# Patient Record
Sex: Male | Born: 1980
Health system: Southern US, Community
[De-identification: ages and names within clinical notes are randomized; demographics above are authoritative.]

## PROBLEM LIST (undated history)

## (undated) DIAGNOSIS — F319 Bipolar disorder, unspecified: Secondary | ICD-10-CM

## (undated) DIAGNOSIS — G20C Parkinsonism, unspecified: Secondary | ICD-10-CM

## (undated) DIAGNOSIS — F410 Panic disorder [episodic paroxysmal anxiety] without agoraphobia: Secondary | ICD-10-CM

## (undated) DIAGNOSIS — E119 Type 2 diabetes mellitus without complications: Secondary | ICD-10-CM

## (undated) DIAGNOSIS — E46 Unspecified protein-calorie malnutrition: Secondary | ICD-10-CM

## (undated) DIAGNOSIS — G20 Parkinson's disease: Secondary | ICD-10-CM

## (undated) HISTORY — PX: NECK SURGERY: SHX720

---

## 2007-01-25 ENCOUNTER — Emergency Department (HOSPITAL_COMMUNITY): Admission: EM | Admit: 2007-01-25 | Discharge: 2007-01-26 | Payer: Self-pay | Admitting: Emergency Medicine

## 2011-02-11 LAB — WOUND CULTURE: Gram Stain: NONE SEEN

## 2014-07-21 ENCOUNTER — Emergency Department (HOSPITAL_COMMUNITY): Payer: Medicaid Other

## 2014-07-21 ENCOUNTER — Encounter (HOSPITAL_COMMUNITY): Payer: Self-pay

## 2014-07-21 ENCOUNTER — Inpatient Hospital Stay (HOSPITAL_COMMUNITY)
Admission: EM | Admit: 2014-07-21 | Discharge: 2014-07-29 | DRG: 391 | Disposition: A | Discharge status: Home / Self Care | Payer: Medicaid Other | Attending: Internal Medicine | Admitting: Internal Medicine

## 2014-07-21 DIAGNOSIS — E43 Unspecified severe protein-calorie malnutrition: Secondary | ICD-10-CM | POA: Insufficient documentation

## 2014-07-21 DIAGNOSIS — E876 Hypokalemia: Secondary | ICD-10-CM | POA: Diagnosis present

## 2014-07-21 DIAGNOSIS — G4700 Insomnia, unspecified: Secondary | ICD-10-CM | POA: Diagnosis present

## 2014-07-21 DIAGNOSIS — K449 Diaphragmatic hernia without obstruction or gangrene: Secondary | ICD-10-CM | POA: Diagnosis present

## 2014-07-21 DIAGNOSIS — K045 Chronic apical periodontitis: Secondary | ICD-10-CM | POA: Diagnosis present

## 2014-07-21 DIAGNOSIS — K036 Deposits [accretions] on teeth: Secondary | ICD-10-CM | POA: Diagnosis present

## 2014-07-21 DIAGNOSIS — R45851 Suicidal ideations: Secondary | ICD-10-CM | POA: Diagnosis present

## 2014-07-21 DIAGNOSIS — K219 Gastro-esophageal reflux disease without esophagitis: Secondary | ICD-10-CM | POA: Diagnosis present

## 2014-07-21 DIAGNOSIS — R509 Fever, unspecified: Secondary | ICD-10-CM | POA: Diagnosis not present

## 2014-07-21 DIAGNOSIS — S0990XA Unspecified injury of head, initial encounter: Secondary | ICD-10-CM

## 2014-07-21 DIAGNOSIS — F8081 Childhood onset fluency disorder: Secondary | ICD-10-CM | POA: Diagnosis present

## 2014-07-21 DIAGNOSIS — G259 Extrapyramidal and movement disorder, unspecified: Secondary | ICD-10-CM

## 2014-07-21 DIAGNOSIS — R1310 Dysphagia, unspecified: Principal | ICD-10-CM

## 2014-07-21 DIAGNOSIS — F329 Major depressive disorder, single episode, unspecified: Secondary | ICD-10-CM | POA: Diagnosis present

## 2014-07-21 DIAGNOSIS — K083 Retained dental root: Secondary | ICD-10-CM | POA: Diagnosis present

## 2014-07-21 DIAGNOSIS — D509 Iron deficiency anemia, unspecified: Secondary | ICD-10-CM | POA: Diagnosis present

## 2014-07-21 DIAGNOSIS — Z590 Homelessness: Secondary | ICD-10-CM

## 2014-07-21 DIAGNOSIS — Z23 Encounter for immunization: Secondary | ICD-10-CM

## 2014-07-21 DIAGNOSIS — K029 Dental caries, unspecified: Secondary | ICD-10-CM | POA: Diagnosis present

## 2014-07-21 DIAGNOSIS — R29818 Other symptoms and signs involving the nervous system: Secondary | ICD-10-CM | POA: Diagnosis present

## 2014-07-21 DIAGNOSIS — K047 Periapical abscess without sinus: Secondary | ICD-10-CM | POA: Diagnosis present

## 2014-07-21 DIAGNOSIS — M264 Malocclusion, unspecified: Secondary | ICD-10-CM | POA: Diagnosis present

## 2014-07-21 DIAGNOSIS — R259 Unspecified abnormal involuntary movements: Secondary | ICD-10-CM

## 2014-07-21 DIAGNOSIS — R262 Difficulty in walking, not elsewhere classified: Secondary | ICD-10-CM | POA: Diagnosis present

## 2014-07-21 DIAGNOSIS — K0530 Chronic periodontitis, unspecified: Secondary | ICD-10-CM | POA: Diagnosis present

## 2014-07-21 HISTORY — DX: Panic disorder (episodic paroxysmal anxiety): F41.0

## 2014-07-21 LAB — CBC WITH DIFFERENTIAL/PLATELET
Basophils Absolute: 0.1 10*3/uL (ref 0.0–0.1)
Basophils Relative: 1 % (ref 0–1)
Eosinophils Absolute: 0.1 10*3/uL (ref 0.0–0.7)
Eosinophils Relative: 2 % (ref 0–5)
HCT: 37.3 % — ABNORMAL LOW (ref 39.0–52.0)
Hemoglobin: 11.7 g/dL — ABNORMAL LOW (ref 13.0–17.0)
Lymphocytes Relative: 6 % — ABNORMAL LOW (ref 12–46)
Lymphs Abs: 0.4 10*3/uL — ABNORMAL LOW (ref 0.7–4.0)
MCH: 23.4 pg — ABNORMAL LOW (ref 26.0–34.0)
MCHC: 31.4 g/dL (ref 30.0–36.0)
MCV: 74.7 fL — ABNORMAL LOW (ref 78.0–100.0)
Monocytes Absolute: 0.8 10*3/uL (ref 0.1–1.0)
Monocytes Relative: 14 % — ABNORMAL HIGH (ref 3–12)
Neutro Abs: 4.6 10*3/uL (ref 1.7–7.7)
Neutrophils Relative %: 77 % (ref 43–77)
Platelets: 360 10*3/uL (ref 150–400)
RBC: 4.99 MIL/uL (ref 4.22–5.81)
RDW: 17 % — ABNORMAL HIGH (ref 11.5–15.5)
WBC: 6 10*3/uL (ref 4.0–10.5)

## 2014-07-21 LAB — URINALYSIS, ROUTINE W REFLEX MICROSCOPIC
Bilirubin Urine: NEGATIVE
Glucose, UA: NEGATIVE mg/dL
Hgb urine dipstick: NEGATIVE
Ketones, ur: 15 mg/dL — AB
Leukocytes, UA: NEGATIVE
Nitrite: NEGATIVE
Protein, ur: NEGATIVE mg/dL
Specific Gravity, Urine: 1.017 (ref 1.005–1.030)
Urobilinogen, UA: 1 mg/dL (ref 0.0–1.0)
pH: 7 (ref 5.0–8.0)

## 2014-07-21 LAB — COMPREHENSIVE METABOLIC PANEL
ALT: 13 U/L (ref 0–53)
AST: 19 U/L (ref 0–37)
Albumin: 2.9 g/dL — ABNORMAL LOW (ref 3.5–5.2)
Alkaline Phosphatase: 62 U/L (ref 39–117)
Anion gap: 7 (ref 5–15)
BUN: <5 mg/dL — ABNORMAL LOW (ref 6–23)
CO2: 29 mmol/L (ref 19–32)
Calcium: 8.8 mg/dL (ref 8.4–10.5)
Chloride: 103 mmol/L (ref 96–112)
Creatinine, Ser: 0.81 mg/dL (ref 0.50–1.35)
GFR calc Af Amer: >90 mL/min (ref >90)
GFR calc non Af Amer: >90 mL/min (ref >90)
Glucose, Bld: 90 mg/dL (ref 70–99)
Potassium: 3.1 mmol/L — ABNORMAL LOW (ref 3.5–5.1)
Sodium: 139 mmol/L (ref 135–145)
Total Bilirubin: 0.7 mg/dL (ref 0.3–1.2)
Total Protein: 6 g/dL (ref 6.0–8.3)

## 2014-07-21 LAB — CK: Total CK: 341 U/L — ABNORMAL HIGH (ref 7–232)

## 2014-07-21 LAB — RAPID URINE DRUG SCREEN, HOSP PERFORMED
Amphetamines: NOT DETECTED
Barbiturates: NOT DETECTED
Benzodiazepines: NOT DETECTED
Cocaine: NOT DETECTED
Opiates: NOT DETECTED
Tetrahydrocannabinol: NOT DETECTED

## 2014-07-21 LAB — MAGNESIUM: Magnesium: 1.8 mg/dL (ref 1.5–2.5)

## 2014-07-21 LAB — SEDIMENTATION RATE: Sed Rate: 9 mm/hr (ref 0–16)

## 2014-07-21 LAB — TSH: TSH: 0.945 u[IU]/mL (ref 0.350–4.500)

## 2014-07-21 MED ORDER — ACETAMINOPHEN 325 MG PO TABS
650.0000 mg | ORAL_TABLET | Freq: Four times a day (QID) | ORAL | Status: DC | PRN
  Administered 2014-07-25 (×2): 650 mg via ORAL
  Filled 2014-07-21 (×2): qty 2

## 2014-07-21 MED ORDER — IOHEXOL 300 MG/ML  SOLN
80.0000 mL | Freq: Once | INTRAMUSCULAR | Status: AC | PRN
  Administered 2014-07-21: 80 mL via INTRAVENOUS

## 2014-07-21 MED ORDER — ONDANSETRON HCL 4 MG/2ML IJ SOLN: 4.0000 mg | Freq: Four times a day (QID) | INTRAMUSCULAR | Status: DC | PRN

## 2014-07-21 MED ORDER — ACETAMINOPHEN 650 MG RE SUPP: 650.0000 mg | Freq: Four times a day (QID) | RECTAL | Status: DC | PRN

## 2014-07-21 MED ORDER — ONDANSETRON HCL 4 MG PO TABS: 4.0000 mg | ORAL_TABLET | Freq: Four times a day (QID) | ORAL | Status: DC | PRN

## 2014-07-21 MED ORDER — SODIUM CHLORIDE 0.9 % IV SOLN
Freq: Once | INTRAVENOUS | Status: AC
  Administered 2014-07-21: 17:00:00 via INTRAVENOUS

## 2014-07-21 MED ORDER — SODIUM CHLORIDE 0.9 % IV SOLN
3.0000 g | Freq: Four times a day (QID) | INTRAVENOUS | Status: DC
  Administered 2014-07-21 – 2014-07-25 (×14): 3 g via INTRAVENOUS
  Filled 2014-07-21 (×16): qty 3

## 2014-07-21 MED ORDER — KCL IN DEXTROSE-NACL 20-5-0.9 MEQ/L-%-% IV SOLN
INTRAVENOUS | Status: AC
  Administered 2014-07-21 – 2014-07-22 (×2): via INTRAVENOUS
  Filled 2014-07-21 (×2): qty 1000

## 2014-07-21 MED ORDER — DIPHENHYDRAMINE HCL 50 MG/ML IJ SOLN
25.0000 mg | Freq: Once | INTRAMUSCULAR | Status: AC
Start: 2014-07-21 — End: 2014-07-21
  Administered 2014-07-21: 25 mg via INTRAVENOUS
  Filled 2014-07-21: qty 1

## 2014-07-21 NOTE — ED Notes (Signed)
X-Ray at bedside.
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
## 2014-07-21 NOTE — ED Notes (Signed)
Main lab, [facility], called to add heavy metals urine screen.

## 2014-07-21 NOTE — ED Notes (Signed)
Patient returned from MRI; notified CT patient is ready for CT.

## 2014-07-21 NOTE — ED Notes (Signed)
Report attempted 
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
## 2014-07-21 NOTE — ED Notes (Signed)
Patient transported to CT 

## 2014-07-21 NOTE — ED Notes (Signed)
With any updated information please call pt cousin Remo Lipps [phone].
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
## 2014-07-21 NOTE — ED Notes (Signed)
Patient transported to MRI 

## 2014-07-21 NOTE — ED Notes (Signed)
Still in CT at time of care handoff.

## 2014-07-22 ENCOUNTER — Observation Stay (HOSPITAL_COMMUNITY): Payer: Medicaid Other

## 2014-07-22 DIAGNOSIS — R262 Difficulty in walking, not elsewhere classified: Secondary | ICD-10-CM | POA: Diagnosis present

## 2014-07-22 DIAGNOSIS — R259 Unspecified abnormal involuntary movements: Secondary | ICD-10-CM

## 2014-07-22 DIAGNOSIS — K449 Diaphragmatic hernia without obstruction or gangrene: Secondary | ICD-10-CM | POA: Diagnosis present

## 2014-07-22 DIAGNOSIS — R45851 Suicidal ideations: Secondary | ICD-10-CM

## 2014-07-22 DIAGNOSIS — F329 Major depressive disorder, single episode, unspecified: Secondary | ICD-10-CM | POA: Diagnosis present

## 2014-07-22 DIAGNOSIS — K0530 Chronic periodontitis, unspecified: Secondary | ICD-10-CM | POA: Diagnosis present

## 2014-07-22 DIAGNOSIS — K219 Gastro-esophageal reflux disease without esophagitis: Secondary | ICD-10-CM | POA: Diagnosis present

## 2014-07-22 DIAGNOSIS — D509 Iron deficiency anemia, unspecified: Secondary | ICD-10-CM | POA: Diagnosis present

## 2014-07-22 DIAGNOSIS — E43 Unspecified severe protein-calorie malnutrition: Secondary | ICD-10-CM | POA: Diagnosis present

## 2014-07-22 DIAGNOSIS — K029 Dental caries, unspecified: Secondary | ICD-10-CM | POA: Diagnosis present

## 2014-07-22 DIAGNOSIS — R1310 Dysphagia, unspecified: Secondary | ICD-10-CM | POA: Diagnosis present

## 2014-07-22 DIAGNOSIS — Z590 Homelessness: Secondary | ICD-10-CM | POA: Diagnosis not present

## 2014-07-22 DIAGNOSIS — F8081 Childhood onset fluency disorder: Secondary | ICD-10-CM | POA: Diagnosis present

## 2014-07-22 DIAGNOSIS — K045 Chronic apical periodontitis: Secondary | ICD-10-CM | POA: Diagnosis present

## 2014-07-22 DIAGNOSIS — K083 Retained dental root: Secondary | ICD-10-CM | POA: Diagnosis present

## 2014-07-22 DIAGNOSIS — F419 Anxiety disorder, unspecified: Secondary | ICD-10-CM

## 2014-07-22 DIAGNOSIS — E876 Hypokalemia: Secondary | ICD-10-CM | POA: Diagnosis present

## 2014-07-22 DIAGNOSIS — Z23 Encounter for immunization: Secondary | ICD-10-CM | POA: Diagnosis not present

## 2014-07-22 DIAGNOSIS — M264 Malocclusion, unspecified: Secondary | ICD-10-CM | POA: Diagnosis present

## 2014-07-22 DIAGNOSIS — G4700 Insomnia, unspecified: Secondary | ICD-10-CM | POA: Diagnosis present

## 2014-07-22 DIAGNOSIS — K047 Periapical abscess without sinus: Secondary | ICD-10-CM | POA: Diagnosis present

## 2014-07-22 LAB — COMPREHENSIVE METABOLIC PANEL
ALT: 11 U/L (ref 0–53)
AST: 18 U/L (ref 0–37)
Albumin: 2.7 g/dL — ABNORMAL LOW (ref 3.5–5.2)
Alkaline Phosphatase: 59 U/L (ref 39–117)
Anion gap: 9 (ref 5–15)
BUN: <5 mg/dL — ABNORMAL LOW (ref 6–23)
CO2: 27 mmol/L (ref 19–32)
Calcium: 8.5 mg/dL (ref 8.4–10.5)
Chloride: 104 mmol/L (ref 96–112)
Creatinine, Ser: 0.94 mg/dL (ref 0.50–1.35)
GFR calc Af Amer: >90 mL/min (ref >90)
GFR calc non Af Amer: >90 mL/min (ref >90)
Glucose, Bld: 98 mg/dL (ref 70–99)
Potassium: 3.2 mmol/L — ABNORMAL LOW (ref 3.5–5.1)
Sodium: 140 mmol/L (ref 135–145)
Total Bilirubin: 1.1 mg/dL (ref 0.3–1.2)
Total Protein: 5.7 g/dL — ABNORMAL LOW (ref 6.0–8.3)

## 2014-07-22 LAB — TROPONIN I
Troponin I: <0.03 ng/mL (ref <0.031)
Troponin I: <0.03 ng/mL (ref <0.031)
Troponin I: <0.03 ng/mL (ref <0.031)

## 2014-07-22 LAB — CBC WITH DIFFERENTIAL/PLATELET
Basophils Absolute: 0 10*3/uL (ref 0.0–0.1)
Basophils Relative: 1 % (ref 0–1)
Eosinophils Absolute: 0.1 10*3/uL (ref 0.0–0.7)
Eosinophils Relative: 3 % (ref 0–5)
HCT: 35.4 % — ABNORMAL LOW (ref 39.0–52.0)
Hemoglobin: 11.1 g/dL — ABNORMAL LOW (ref 13.0–17.0)
Lymphocytes Relative: 11 % — ABNORMAL LOW (ref 12–46)
Lymphs Abs: 0.6 10*3/uL — ABNORMAL LOW (ref 0.7–4.0)
MCH: 23.6 pg — ABNORMAL LOW (ref 26.0–34.0)
MCHC: 31.4 g/dL (ref 30.0–36.0)
MCV: 75.2 fL — ABNORMAL LOW (ref 78.0–100.0)
Monocytes Absolute: 0.9 10*3/uL (ref 0.1–1.0)
Monocytes Relative: 18 % — ABNORMAL HIGH (ref 3–12)
Neutro Abs: 3.3 10*3/uL (ref 1.7–7.7)
Neutrophils Relative %: 67 % (ref 43–77)
Platelets: 361 10*3/uL (ref 150–400)
RBC: 4.71 MIL/uL (ref 4.22–5.81)
RDW: 17.3 % — ABNORMAL HIGH (ref 11.5–15.5)
WBC: 5 10*3/uL (ref 4.0–10.5)

## 2014-07-22 LAB — VITAMIN B12: Vitamin B-12: 492 pg/mL (ref 211–911)

## 2014-07-22 LAB — RPR: RPR Ser Ql: NONREACTIVE

## 2014-07-22 LAB — HIV ANTIBODY (ROUTINE TESTING W REFLEX): HIV Screen 4th Generation wRfx: NONREACTIVE

## 2014-07-22 LAB — CK: Total CK: 218 U/L (ref 7–232)

## 2014-07-22 LAB — RETICULOCYTES
RBC.: 4.71 MIL/uL (ref 4.22–5.81)
Retic Count, Absolute: 51.8 10*3/uL (ref 19.0–186.0)
Retic Ct Pct: 1.1 % (ref 0.4–3.1)

## 2014-07-22 LAB — FOLATE: Folate: 17.9 ng/mL

## 2014-07-22 LAB — MRSA PCR SCREENING: MRSA by PCR: NEGATIVE

## 2014-07-22 LAB — TSH: TSH: 0.89 u[IU]/mL (ref 0.350–4.500)

## 2014-07-22 MED ORDER — MIRTAZAPINE 15 MG PO TBDP
15.0000 mg | ORAL_TABLET | Freq: Every day | ORAL | Status: DC
  Administered 2014-07-22 – 2014-07-23 (×2): 15 mg via ORAL
  Filled 2014-07-22 (×4): qty 1

## 2014-07-22 MED ORDER — CETYLPYRIDINIUM CHLORIDE 0.05 % MT LIQD
7.0000 mL | Freq: Two times a day (BID) | OROMUCOSAL | Status: DC
  Administered 2014-07-22 – 2014-07-28 (×14): 7 mL via OROMUCOSAL

## 2014-07-22 MED ORDER — INFLUENZA VAC SPLIT QUAD 0.5 ML IM SUSY
0.5000 mL | PREFILLED_SYRINGE | INTRAMUSCULAR | Status: AC
  Administered 2014-07-29: 0.5 mL via INTRAMUSCULAR
  Filled 2014-07-22 (×2): qty 0.5
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
## 2014-07-22 NOTE — Progress Notes (Signed)
INITIAL NUTRITION ASSESSMENT  Pt meets criteria for SEVERE MALNUTRITION in the context of acute illness/injury as evidenced by energy intake intake </= 50% for >/= 5 days, severe fat mass loss, and moderate to severe muscle mass loss.  DOCUMENTATION CODES Per approved criteria  -Severe malnutrition in the context of acute illness or injury   INTERVENTION: If pt continues to be NPO due to difficulty swallowing, recommend initiating enteral nutrition of Jevity 1.2 at 20 ml/hr and increase by 10 ml every 10 hours to goal rate of 70 ml/hr to provide 2016 kcal (100% of needs), 93 grams of protein, and 1361 ml of free water.  Once pt is started on nutrition, monitor magnesium, potassium, and phosphorus daily for at least 3 days, MD to replete as needed, as pt is at risk for refeeding syndrome given severe malnutrition and little to no PO intake in 6 weeks.  RD to continue to monitor.   NUTRITION DIAGNOSIS: Inadequate oral intake related to inability to eat as evidenced by NPO status  Goal: Pt to meet >/= 90% of their estimated nutrition needs   Monitor:  Diet advancement vs. EN, weight trends, labs, I/O's  Reason for Assessment: MD consult for assessment of nutrition requirements/status  34 y.o. male  Admitting Dx: Dysphagia  ASSESSMENT: Pt with no significant past medical history has been experiencing difficulty swallowing over the last 6 weeks. Pt unable to eat because he says he can not swallow. His speech is slurred and he stutters and can not get a lot of his words out intelligibly.  Family present at bedside. Pt able to understand questions asked, however unable to speak clearly. Family reports pt has been having difficulties swallowing over the past 6 weeks. She reports pt's po intake has been little to none. Pt is at risk for refeeding syndrome. Prior to the swallowing difficulty, family reports pt has been eating fine with no other difficulties. Family reports pt has had weight  loss with usual body weight of 140 lbs. Noted, pt with a 5% weight loss. Per SLP swallowing evaluation, diet recommendations are for NPO. If continues to be NPO due to difficulty swallowing, recommend enteral nutrition. TF recommendation orders have been given.   Nutrition Focused Physical Exam:  Subcutaneous Fat:  Orbital Region: N/A Upper Arm Region: Mild depletion Thoracic and Lumbar Region: Severe depletion  Muscle:  Temple Region: Moderate to Severe depletion Clavicle Bone Region: Moderate depletion Clavicle and Acromion Bone Region: Moderate depletion Scapular Bone Region: N/A Dorsal Hand: N/A Patellar Region: WNL Anterior Thigh Region: Moderate depletion Posterior Calf Region: WNL  Edema: none  Labs: Low potassium and BUN.  Height: Ht Readings from Last 1 Encounters:  07/21/14 5' 8"  (1.727 m)    Weight: Wt Readings from Last 1 Encounters:  07/21/14 133 lb 11.2 oz (60.646 kg)    Ideal Body Weight: 154 lbs  % Ideal Body Weight: 86%  Wt Readings from Last 10 Encounters:  07/21/14 133 lb 11.2 oz (60.646 kg)    Usual Body Weight: 140 lbs (per family report)  % Usual Body Weight: 95%  BMI:  Body mass index is 20.33 kg/(m^2).  Estimated Nutritional Needs: Kcal: 2000-2200 Protein: 80-100 grams Fluid: 2- 2.2 L/day  Skin: Intact  Diet Order: Diet NPO time specified  EDUCATION NEEDS: -No education needs identified at this time   Intake/Output Summary (Last 24 hours) at 07/22/14 1216 Last data filed at 07/22/14 0846  Gross per 24 hour  Intake 553.75 ml  Output  0 ml  Net 553.75 ml    Last BM: PTA  Labs:   Recent Labs Lab 07/21/14 1619 07/22/14 0555  NA 139 140  K 3.1* 3.2*  CL 103 104  CO2 29 27  BUN <5* <5*  CREATININE 0.81 0.94  CALCIUM 8.8 8.5  MG 1.8  --   GLUCOSE 90 98    CBG (last 3)  No results for input(s): GLUCAP in the last 72 hours.  Scheduled Meds: . ampicillin-sulbactam (UNASYN) IV  3 g Intravenous Q6H  .  antiseptic oral rinse  7 mL Mouth Rinse BID  . [START ON 07/23/2014] Influenza vac split quadrivalent PF  0.5 mL Intramuscular Tomorrow-1000    Continuous Infusions: . dextrose 5 % and 0.9 % NaCl with KCl 20 mEq/L 75 mL/hr at 07/22/14 0700    Past Medical History  Diagnosis Date  . Panic attack     Past Surgical History  Procedure Laterality Date  . Neck surgery      Kallie Locks, MS, RD, LDN Pager # [phone] After hours/ weekend pager # [phone]
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
## 2014-07-22 NOTE — Evaluation (Signed)
Physical Therapy Evaluation Patient Details Name: Carl Carey MRN: [id number] DOB: Apr 14, 1981 Today's Date: 07/22/2014   History of Present Illness  Pt is a 34 y.o. male with no significant PMH has been experiencing difficulty swallowing over the last 6 weeks. Patient was recently brought to home by patient's family from [place]. Patient states he has been unable to drink ice tea for last few weeks. Patient finds it difficult to swallow. In addition patient has been having difficulty walking with gait difficulties and tremors. Patient also takes time to bring out words. In the ER patient had CT maxillofacial and soft tissue neck and MRI brain. Nothing acute was seen but did show some periapical abscess. Labs show anemia. On-call neurologist has been consulted and patient has been admitted for further management.   Clinical Impression  Pt admitted with above diagnosis. Pt currently with functional limitations due to the deficits listed below (see PT Problem List). At the time of PT eval pt was able to perform transfers and ambulation with min assist. Pt appears to have declined signficantly in the past 6 weeks, and states his mother has to assist with all his ADL's. Pt also endorses that he has had difficulty feeding himself/holding a cup. OT consult requested. Feel that pt ultimately would benefit from a CIR stay to improve functional mobility and independence with ADL's. Due to observation status and lack of insurance coverage, recommending SNF after discussion with CIR Coordinator and CSW. If pt changes to an Inpatient status, will recommend a CIR screen.      Follow Up Recommendations SNF;Supervision/Assistance - 24 hour    Equipment Recommendations  Other (comment) (Shower seat; adaptive equipment per OT recommendations)    Recommendations for Other Services       Precautions / Restrictions Precautions Precautions: Fall Restrictions Weight Bearing Restrictions: No       Mobility  Bed Mobility Overal bed mobility: Needs Assistance Bed Mobility: Supine to Sit     Supine to sit: Min assist;HOB elevated     General bed mobility comments: Assist to achieve EOB. Pt required step-by-step cueing for sequencing.   Transfers Overall transfer level: Needs assistance Equipment used: None Transfers: Sit to/from Stand Sit to Stand: Min assist         General transfer comment: Assist for support and balance as pt powered-up to full standing position. Increased time to fully extend hips.   Ambulation/Gait Ambulation/Gait assistance: Min assist;+2 safety/equipment Ambulation Distance (Feet): 225 Feet Assistive device: 1 person hand held assist Gait Pattern/deviations: Decreased stride length;Shuffle;Trunk flexed;Narrow base of support Gait velocity: Decreased Gait velocity interpretation: Below normal speed for age/gender General Gait Details: Pt with no noted trunk rotation or arm swing. Pt turned whole body at once instead of only turning his head to look side to side. +2 assist helpful for equipment management as pt unable to push the IV pole while walking or hold a washcloth to his mouth to control excessive drooling.  Stairs            Wheelchair Mobility    Modified Rankin (Stroke Patients Only)       Balance Overall balance assessment: Needs assistance Sitting-balance support: Feet supported;No upper extremity supported Sitting balance-Leahy Scale: Fair Sitting balance - Comments: Increased time to achieve full sitting EOB but was able to hold without assist.    Standing balance support: No upper extremity supported Standing balance-Leahy Scale: Fair  Pertinent Vitals/Pain Pain Assessment: 0-10 Pain Score: 8  Faces Pain Scale: No hurt Pain Location: shoulders    Home Living Family/patient expects to be discharged to:: Private residence Living Arrangements: Parent Available Help at  Discharge: Family;Available PRN/intermittently             Additional Comments: Pt is not able to provide many details of home environment due to difficulty understanding pt    Prior Function Level of Independence: Needs assistance      ADL's / Homemaking Assistance Needed: Pt states his mother was assisting him with all bathing and dressing in the past 6 weeks.         Hand Dominance        Extremity/Trunk Assessment   Upper Extremity Assessment: Defer to OT evaluation (Noted pill rolling and possible increased tone with ROM)           Lower Extremity Assessment: Overall WFL for tasks assessed (Strength grossly 4+/5 but moves slowly actively)      Cervical / Trunk Assessment: Other exceptions  Communication   Communication: Expressive difficulties (Stuttering )  Cognition Arousal/Alertness: Awake/alert Behavior During Therapy: Flat affect Overall Cognitive Status: Difficult to assess                      General Comments      Exercises        Assessment/Plan    PT Assessment Patient needs continued PT services  PT Diagnosis Difficulty walking;Abnormality of gait   PT Problem List Decreased strength;Decreased range of motion;Decreased activity tolerance;Decreased mobility;Decreased balance;Decreased knowledge of use of DME;Decreased safety awareness;Decreased knowledge of precautions  PT Treatment Interventions DME instruction;Gait training;Functional mobility training;Stair training;Therapeutic activities;Therapeutic exercise;Neuromuscular re-education;Patient/family education   PT Goals (Current goals can be found in the Care Plan section) Acute Rehab PT Goals Patient Stated Goal: Pt did not state goals.  PT Goal Formulation: With patient Time For Goal Achievement: 08/05/14 Potential to Achieve Goals: Good    Frequency Min 3X/week   Barriers to discharge Decreased caregiver support Pt is alone during the day.     Co-evaluation                End of Session Equipment Utilized During Treatment: Gait belt Activity Tolerance: Patient tolerated treatment well Patient left: in chair;with call bell/phone within reach;Other (comment) (Alarm pad placed in chair but no box available. NT to get.) Nurse Communication: Mobility status    Functional Assessment Tool Used: Clinical judgement Functional Limitation: Mobility: Walking and moving around Mobility: Walking and Moving Around Current Status (I6803): At least 40 percent but less than 60 percent impaired, limited or restricted Mobility: Walking and Moving Around Goal Status [phone]): At least 20 percent but less than 40 percent impaired, limited or restricted    Time: 8250-0370 PT Time Calculation (min) (ACUTE ONLY): 24 min   Charges:   PT Evaluation $Initial PT Evaluation Tier I: 1 Procedure PT Treatments $Gait Training: 8-22 mins   PT G Codes:   PT G-Codes **NOT FOR INPATIENT CLASS** Functional Assessment Tool Used: Clinical judgement Functional Limitation: Mobility: Walking and moving around Mobility: Walking and Moving Around Current Status (W8889): At least 40 percent but less than 60 percent impaired, limited or restricted Mobility: Walking and Moving Around Goal Status [phone]): At least 20 percent but less than 40 percent impaired, limited or restricted    Rolinda Roan 07/22/2014, 10:43 AM   Rolinda Roan, PT, DPT Acute Rehabilitation Services Pager: [phone]

## 2014-07-22 NOTE — Progress Notes (Signed)
Occupational Therapy Evaluation Patient Details Name: Carl Carey MRN: [id number] DOB: 05-06-1980 Today's Date: 07/22/2014    History of Present Illness Pt is a 34 y.o. male with no significant PMH has been experiencing difficulty swallowing over the last 6 weeks. Patient was recently brought to home by patient's family from [place]. Per chart, Patient finds it difficult to swallow. In addition patient has been having difficulty walking with gait difficulties and tremors. In the ER patient had CT maxillofacial and soft tissue neck and MRI brain. Nothing acute was seen but did show some periapical abscess. Labs show anemia. Work up underway.   Clinical Impression   PTA, pt was homeless. Pt recently took bus to [place], where family picked him up. He has been living in [place] with his family and they have been assisting him with ADL and mobility. Pt able to answer questions after a delay, however speech is dysfluent and pt appears to stutter. Pt currently requires assistance with ADL and mobility and will benefit from acute OT to maximize functional level of independence and facilitate safe D/C home with 24/7 A of family and [place].     Follow Up Recommendations  Home health OT;Supervision/Assistance - 24 hour    Equipment Recommendations  3 in 1 bedside comode    Recommendations for Other Services       Precautions / Restrictions Precautions Precautions: Fall      Mobility Bed Mobility Overal bed mobility: Needs Assistance Bed Mobility: Supine to Sit;Sit to Supine     Supine to sit: Supervision Sit to supine: Supervision      Transfers Overall transfer level: Needs assistance   Transfers: Sit to/from Stand;Stand Pivot Transfers Sit to Stand: Supervision Stand pivot transfers: Min guard       General transfer comment: able to stand and take a few steps forward. min LOB posteriorly when stepping backward    Balance Overall balance assessment: Needs assistance    Sitting balance-Leahy Scale: Fair     Standing balance support: No upper extremity supported;During functional activity Standing balance-Leahy Scale: Fair                              ADL Overall ADL's : Needs assistance/impaired Eating/Feeding: NPO Eating/Feeding Details (indicate cue type and reason): Pt unable to swallow at this time Grooming: Minimal assistance Grooming Details (indicate cue type and reason): did not brush teeth due to swallow. Pt able to bring suction to mouth with vc. tactile cues to suction around tongue Upper Body Bathing: Minimal assitance   Lower Body Bathing: Moderate assistance (with continued verbal cues)   Upper Body Dressing : Moderate assistance   Lower Body Dressing: Minimal assistance   Toilet Transfer: Minimal assistance;Ambulation   Toileting- Clothing Manipulation and Hygiene: Min guard       Functional mobility during ADLs: Min guard General ADL Comments: Able to follow commands. Movements are very slow. Difficulty terminating movements     Vision  will further assess   Perception     Praxis Praxis Praxis tested?: Deficits Deficits: Perseveration Praxis-Other Comments: Pt requires cues to end tasks. Left suction at mouth and required tactile cues to remove suction from mouth.    Pertinent Vitals/Pain Pain Assessment: No/denies pain     Hand Dominance Right   Extremity/Trunk Assessment Upper Extremity Assessment Upper Extremity Assessment: RUE deficits/detail;LUE deficits/detail RUE Deficits / Details: AROM WFL although movements are not smooth; bradykinesia;"tremors" at times RUE Sensation:  (  pt reports WNL) RUE Coordination: decreased fine motor;decreased gross motor LUE Deficits / Details: same as R   Lower Extremity Assessment Lower Extremity Assessment:  (braykinesia)   Cervical / Trunk Assessment Cervical / Trunk Assessment: Other exceptions (appears "stiff")   Communication  Communication Communication: Expressive difficulties (apparent stuttering/poor vocal quality)   Cognition Arousal/Alertness: Awake/alert Behavior During Therapy: Flat affect Overall Cognitive Status: Difficult to assess (follows commands)                     General Comments       Exercises       Shoulder Instructions      Home Living Family/patient expects to be discharged to:: Private residence Living Arrangements: Parent Available Help at Discharge: Family;Available 24 hours/day Type of Home: House Home Access: Stairs to enter [employer] of Steps: 2   Home Layout: One level     Bathroom Shower/Tub: Tub/shower unit Shower/tub characteristics: Architectural technologist: Standard Bathroom Accessibility: Yes How Accessible: Accessible via walker Home Equipment: None          Prior Functioning/Environment Level of Independence: Needs assistance    ADL's / Homemaking Assistance Needed: Mother ahs been assisting him since she picked him up from [place] / Swallowing Assistance Needed: difficulty swallowing      OT Diagnosis: Generalized weakness;Other (comment) (movement disorder)   OT Problem List: Decreased strength;Decreased activity tolerance;Impaired balance (sitting and/or standing);Decreased coordination;Decreased knowledge of use of DME or AE;Impaired tone;Impaired UE functional use   OT Treatment/Interventions: Self-care/ADL training;Therapeutic exercise;Neuromuscular education;DME and/or AE instruction;Therapeutic activities;Patient/family education;Balance training    OT Goals(Current goals can be found in the care plan section) Acute Rehab OT Goals Patient Stated Goal: Pt did not state goals.  OT Goal Formulation: With patient/family Time For Goal Achievement: 08/05/14 Potential to Achieve Goals: Good  OT Frequency: Min 2X/week   Barriers to D/C:            Co-evaluation              End of Session Nurse  Communication: Mobility status  Activity Tolerance: Patient tolerated treatment well Patient left: in bed;with call bell/phone within reach;with bed alarm set;with family/visitor present   Time: 2585-2778 OT Time Calculation (min): 20 min Charges:  OT General Charges $OT Visit: 1 Procedure OT Evaluation $Initial OT Evaluation Tier I: 1 Procedure G-Codes: OT G-codes **NOT FOR INPATIENT CLASS** Functional Assessment Tool Used: clinical judgement Functional Limitation: Self care Self Care Current Status (E4235): At least 40 percent but less than 60 percent impaired, limited or restricted Self Care Goal Status (T6144): At least 1 percent but less than 20 percent impaired, limited or restricted  [facility] 07/22/2014, 2:39 PM   [facility], OTR/L  [phone] 07/22/2014

## 2014-07-22 NOTE — Progress Notes (Signed)
OT Cancellation Note  Patient Details Name: Carl Carey MRN: [id number] DOB: July 05, 1980   Cancelled Treatment:    Reason Eval/Treat Not Completed: Patient at procedure or test/ unavailable Pt with MD. Will return later if able. [place], OTR/L  [phone] 07/22/2014 07/22/2014, 12:26 PM

## 2014-07-22 NOTE — Consult Note (Signed)
[facility] Consult   Reason for Consult:  Depression, anxiety, suicidal thoughts and behaviors Referring Physician:  Dr. Eliseo Squires Patient Identification: Carl Carey MRN:  [id number] Principal Diagnosis: Dysphagia Diagnosis:   Patient Active Problem List   Diagnosis Date Noted  . Parkinsonian features [R25.9] 07/21/2014  . Dysphagia [R13.10] 07/21/2014  . Periapical abscess [K04.7] 07/21/2014  . Microcytic anemia [D50.9] 07/21/2014    Total Time spent with patient: 1 hour  Subjective:   Carl Carey is a 33 y.o. male patient admitted with generalized weakness, dysphagia and depression, anxiety, suicidal thoughts and behaviors.  HPI:  Carl Carey is an 34 y.o. male seen face-to-face for psychiatric evaluation for depression, anxiety, passive suicidal ideation and old behaviors. Reportedly patient has no previous acute psychiatric hospitalization or treatment. Patient was seen with his mother and a cousin in his room. Patient reported feeling depressed, sad, anxious, worried, weak, not able to walk and not able to swallow solid food. Reportedly he has been drinking fluids over 6-7 weeks. Patient mother and cousin reported that patient has been drifting away from home for the last 7 years and going to different cities like [place], [place] and [place] and living as a homeless. Reportedly he was sometimes stayed in homeless shelter, taking shower once a week and eat food wherever he can get. Patient also reported panhandling money to move around different cities. 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 Patient reported feeling anxiety about not able to swallow food. Patient stated having the suicidal thoughts sometimes but no intention or plans. Patient has no evidence of auditory/visual hallucinations, delusions or paranoia during this evaluation. Patient denied  history of drinking alcohol and abusing illicit drugs.  Medical history: Patient with a very confusing history. 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 The last the family has seen the patient was about 6-7 years ago. They got a call at the end of the week from [id number] asking them to pick him up. They brought him home where he has been unable to eat because he says he can not swallow. His mother reports that he has been drinking tea. He walks slowly. His speech is slurred and he stutters and can not get a lot of his words out intelligibly. He has been drooling. He reports that he has been this way for about 6 weeks but was able to get on a Grey[employer]ound bus from [place] to [place] about 2 weeks ago. He was prescribed Reglan and Protonix at [place]. His mother has been administering twice the prescribed dose. He reports he has been on no medications prior to this. He does not report any exposures. He has been living on the street.  When they last saw the patient 6-7 years ago he was normal.  HPI Elements:   Location:  Depression and anxiety. Quality:  Unable to care for himself. Severity:  Unable to swallow which made him more depressed and anxious. Timing:  6 weeks. 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 Context:  Severe psychosocial stresses.  Past Medical History:  Past Medical History  Diagnosis Date  . Panic attack     Past Surgical History  Procedure Laterality Date  . Neck surgery     Family History:  Family History  Problem Relation Age of  Onset  . Hypertension Maternal Grandmother    Social History:  History  Alcohol Use No     History  Drug Use No    History   Social History  . Marital Status: Single    Spouse Name: N/A  . Number of Children: N/A  . Years of Education: N/A   Social History Main Topics  . Smoking status: Never Smoker   . Smokeless tobacco: Not on file  . Alcohol Use: No   . Drug Use: No  . Sexual Activity: Not on file   Other Topics Concern  . None   Social History Narrative  . None   Additional Social History:                          Allergies:  No Known Allergies  Labs:  Results for orders placed or performed during the hospital encounter of 07/21/14 (from the past 48 hour(s))  CBC with Differential/Platelet     Status: Abnormal   Collection Time: 07/21/14  4:19 PM  Result Value Ref Range   WBC 6.0 4.0 - 10.5 K/uL   RBC 4.99 4.22 - 5.81 MIL/uL   Hemoglobin 11.7 (L) 13.0 - 17.0 g/dL   HCT 37.3 (L) 39.0 - 52.0 %   MCV 74.7 (L) 78.0 - 100.0 fL   MCH 23.4 (L) 26.0 - 34.0 pg   MCHC 31.4 30.0 - 36.0 g/dL   RDW 17.0 (H) 11.5 - 15.5 %   Platelets 360 150 - 400 K/uL   Neutrophils Relative % 77 43 - 77 %   Neutro Abs 4.6 1.7 - 7.7 K/uL   Lymphocytes Relative 6 (L) 12 - 46 %   Lymphs Abs 0.4 (L) 0.7 - 4.0 K/uL   Monocytes Relative 14 (H) 3 - 12 %   Monocytes Absolute 0.8 0.1 - 1.0 K/uL   Eosinophils Relative 2 0 - 5 %   Eosinophils Absolute 0.1 0.0 - 0.7 K/uL   Basophils Relative 1 0 - 1 %   Basophils Absolute 0.1 0.0 - 0.1 K/uL  Comprehensive metabolic panel     Status: Abnormal   Collection Time: 07/21/14  4:19 PM  Result Value Ref Range   Sodium 139 135 - 145 mmol/L   Potassium 3.1 (L) 3.5 - 5.1 mmol/L   Chloride 103 96 - 112 mmol/L   CO2 29 19 - 32 mmol/L   Glucose, Bld 90 70 - 99 mg/dL   BUN <5 (L) 6 - 23 mg/dL   Creatinine, Ser 0.81 0.50 - 1.35 mg/dL   Calcium 8.8 8.4 - 10.5 mg/dL   Total Protein 6.0 6.0 - 8.3 g/dL   Albumin 2.9 (L) 3.5 - 5.2 g/dL   AST 19 0 - 37 U/L   ALT 13 0 - 53 U/L   Alkaline Phosphatase 62 39 - 117 U/L   Total Bilirubin 0.7 0.3 - 1.2 mg/dL   GFR calc non Af Amer >90 >90 mL/min   GFR calc Af Amer >90 >90 mL/min    Comment: (NOTE) The eGFR has been calculated using the CKD EPI equation. This calculation has not been validated in all clinical situations. eGFR's persistently <90 mL/min signify  possible Chronic Kidney Disease.    Anion gap 7 5 - 15  CK     Status: Abnormal   Collection Time: 07/21/14  4:19 PM  Result Value Ref Range   Total CK 341 (H) 7 - 232 U/L  TSH  Status: None   Collection Time: 07/21/14  4:19 PM  Result Value Ref Range   TSH 0.945 0.350 - 4.500 uIU/mL  Magnesium     Status: None   Collection Time: 07/21/14  4:19 PM  Result Value Ref Range   Magnesium 1.8 1.5 - 2.5 mg/dL  Urinalysis, Routine w reflex microscopic     Status: Abnormal   Collection Time: 07/21/14  4:45 PM  Result Value Ref Range   Color, Urine YELLOW YELLOW   APPearance CLOUDY (A) CLEAR   Specific Gravity, Urine 1.017 1.005 - 1.030   pH 7.0 5.0 - 8.0   Glucose, UA NEGATIVE NEGATIVE mg/dL   Hgb urine dipstick NEGATIVE NEGATIVE   Bilirubin Urine NEGATIVE NEGATIVE   Ketones, ur 15 (A) NEGATIVE mg/dL   Protein, ur NEGATIVE NEGATIVE mg/dL   Urobilinogen, UA 1.0 0.0 - 1.0 mg/dL   Nitrite NEGATIVE NEGATIVE   Leukocytes, UA NEGATIVE NEGATIVE    Comment: MICROSCOPIC NOT DONE ON URINES WITH NEGATIVE PROTEIN, BLOOD, LEUKOCYTES, NITRITE, OR GLUCOSE <1000 mg/dL.  Urine rapid drug screen (hosp performed)     Status: None   Collection Time: 07/21/14  4:45 PM  Result Value Ref Range   Opiates NONE DETECTED NONE DETECTED   Cocaine NONE DETECTED NONE DETECTED   Benzodiazepines NONE DETECTED NONE DETECTED   Amphetamines NONE DETECTED NONE DETECTED   Tetrahydrocannabinol NONE DETECTED NONE DETECTED   Barbiturates NONE DETECTED NONE DETECTED    Comment:        DRUG SCREEN FOR MEDICAL PURPOSES ONLY.  IF CONFIRMATION IS NEEDED FOR ANY PURPOSE, NOTIFY LAB WITHIN 5 DAYS.        LOWEST DETECTABLE LIMITS FOR URINE DRUG SCREEN Drug Class       Cutoff (ng/mL) Amphetamine      1000 Barbiturate      200 Benzodiazepine   937 Tricyclics       342 Opiates          300 Cocaine          300 THC              50   Sedimentation rate     Status: None   Collection Time: 07/21/14  9:55 PM  Result  Value Ref Range   Sed Rate 9 0 - 16 mm/hr  Vitamin B12     Status: None   Collection Time: 07/21/14  9:55 PM  Result Value Ref Range   Vitamin B-12 492 211 - 911 pg/mL    Comment: Performed at [facility]  Folate     Status: None   Collection Time: 07/21/14  9:55 PM  Result Value Ref Range   Folate 17.9 ng/mL    Comment: (NOTE) Reference Ranges        Deficient:       0.4 - 3.3 ng/mL        Indeterminate:   3.4 - 5.4 ng/mL        Normal:              > 5.4 ng/mL Performed at [facility]   MRSA PCR Screening     Status: None   Collection Time: 07/21/14 11:55 PM  Result Value Ref Range   MRSA by PCR NEGATIVE NEGATIVE    Comment:        The GeneXpert MRSA Assay (FDA approved for NASAL specimens only), is one component of a comprehensive MRSA colonization surveillance program. It is not intended to diagnose MRSA infection  nor to guide or monitor treatment for MRSA infections.   Troponin I (q 6hr x 3)     Status: None   Collection Time: 07/22/14 12:13 AM  Result Value Ref Range   Troponin I <0.03 <0.031 ng/mL    Comment:        NO INDICATION OF MYOCARDIAL INJURY.   Reticulocytes     Status: None   Collection Time: 07/22/14  5:55 AM  Result Value Ref Range   Retic Ct Pct 1.1 0.4 - 3.1 %   RBC. 4.71 4.22 - 5.81 MIL/uL   Retic Count, Manual 51.8 19.0 - 186.0 K/uL  Comprehensive metabolic panel     Status: Abnormal   Collection Time: 07/22/14  5:55 AM  Result Value Ref Range   Sodium 140 135 - 145 mmol/L   Potassium 3.2 (L) 3.5 - 5.1 mmol/L   Chloride 104 96 - 112 mmol/L   CO2 27 19 - 32 mmol/L   Glucose, Bld 98 70 - 99 mg/dL   BUN <5 (L) 6 - 23 mg/dL   Creatinine, Ser 0.94 0.50 - 1.35 mg/dL   Calcium 8.5 8.4 - 10.5 mg/dL   Total Protein 5.7 (L) 6.0 - 8.3 g/dL   Albumin 2.7 (L) 3.5 - 5.2 g/dL   AST 18 0 - 37 U/L   ALT 11 0 - 53 U/L   Alkaline Phosphatase 59 39 - 117 U/L   Total Bilirubin 1.1 0.3 - 1.2 mg/dL   GFR calc non Af Amer >90 >90 mL/min    GFR calc Af Amer >90 >90 mL/min    Comment: (NOTE) The eGFR has been calculated using the CKD EPI equation. This calculation has not been validated in all clinical situations. eGFR's persistently <90 mL/min signify possible Chronic Kidney Disease.    Anion gap 9 5 - 15  CBC with Differential/Platelet     Status: Abnormal   Collection Time: 07/22/14  5:55 AM  Result Value Ref Range   WBC 5.0 4.0 - 10.5 K/uL   RBC 4.71 4.22 - 5.81 MIL/uL   Hemoglobin 11.1 (L) 13.0 - 17.0 g/dL   HCT 35.4 (L) 39.0 - 52.0 %   MCV 75.2 (L) 78.0 - 100.0 fL   MCH 23.6 (L) 26.0 - 34.0 pg   MCHC 31.4 30.0 - 36.0 g/dL   RDW 17.3 (H) 11.5 - 15.5 %   Platelets 361 150 - 400 K/uL   Neutrophils Relative % 67 43 - 77 %   Neutro Abs 3.3 1.7 - 7.7 K/uL   Lymphocytes Relative 11 (L) 12 - 46 %   Lymphs Abs 0.6 (L) 0.7 - 4.0 K/uL   Monocytes Relative 18 (H) 3 - 12 %   Monocytes Absolute 0.9 0.1 - 1.0 K/uL   Eosinophils Relative 3 0 - 5 %   Eosinophils Absolute 0.1 0.0 - 0.7 K/uL   Basophils Relative 1 0 - 1 %   Basophils Absolute 0.0 0.0 - 0.1 K/uL  TSH     Status: None   Collection Time: 07/22/14  5:55 AM  Result Value Ref Range   TSH 0.890 0.350 - 4.500 uIU/mL  Troponin I (q 6hr x 3)     Status: None   Collection Time: 07/22/14  5:55 AM  Result Value Ref Range   Troponin I <0.03 <0.031 ng/mL    Comment:        NO INDICATION OF MYOCARDIAL INJURY.   CK     Status: None   Collection Time:  07/22/14  5:55 AM  Result Value Ref Range   Total CK 218 7 - 232 U/L    Vitals: Blood pressure 118/66, pulse 70, temperature 98.7 F (37.1 C), temperature source Oral, resp. rate 18, height _0  (1.727 m), weight 60.646 kg (133 lb 11.2 oz), SpO2 100 %.  Risk to Self: Is patient at risk for suicide?: No Risk to Others:   Prior Inpatient Therapy:   Prior Outpatient Therapy:    Current Facility-Administered Medications  Medication Dose Route Frequency Provider Last Rate Last Dose  . acetaminophen (TYLENOL)  tablet 650 mg  650 mg Oral Q6H PRN Rise Patience, MD       Or  . acetaminophen (TYLENOL) suppository 650 mg  650 mg Rectal Q6H PRN Rise Patience, MD      . Ampicillin-Sulbactam (UNASYN) 3 g in sodium chloride 0.9 % 100 mL IVPB  3 g Intravenous Q6H Rise Patience, MD   3 g at 07/22/14 0526  . antiseptic oral rinse (CPC / CETYLPYRIDINIUM CHLORIDE 0.05%) solution 7 mL  7 mL Mouth Rinse BID Rise Patience, MD      . dextrose 5 % and 0.9 % NaCl with KCl 20 mEq/L infusion   Intravenous Continuous Rise Patience, MD 75 mL/hr at 07/22/14 0700    . [START ON 07/23/2014] Influenza vac split quadrivalent PF (FLUARIX) injection 0.5 mL  0.5 mL Intramuscular Tomorrow-1000 Jessica U Vann, DO      . ondansetron (ZOFRAN) tablet 4 mg  4 mg Oral Q6H PRN Rise Patience, MD       Or  . ondansetron (ZOFRAN) injection 4 mg  4 mg Intravenous Q6H PRN Rise Patience, MD        Musculoskeletal: Strength & Muscle Tone: decreased Gait & Station: unable to stand Patient leans: N/A  Psychiatric Specialty Exam: Physical Exam  ROS  Blood pressure 118/66, pulse 70, temperature 98.7 F (37.1 C), temperature source Oral, resp. rate 18, height _1  (1.727 m), weight 60.646 kg (133 lb 11.2 oz), SpO2 100 %.Body mass index is 20.33 kg/(m^2).  General Appearance: Guarded  Eye Contact::  Fair  Speech:  Slurred and stuttering  Volume:  Decreased  Mood:  Anxious and Depressed  Affect:  Constricted and Depressed  Thought Process:  Coherent and Goal Directed  Orientation:  Full (Time, Place, and Person)  Thought Content:  WDL  Suicidal Thoughts:  No  Homicidal Thoughts:  No  Memory:  Immediate;   Fair Recent;   Fair  Judgement:  Impaired  Insight:  Shallow  Psychomotor Activity:  Decreased  Concentration:  Fair  Recall:  Good  Fund of Knowledge:Good  Language: Good  Akathisia:  NA  Handed:  Right  AIMS (if indicated):     Assets:  Communication Skills Desire for  Improvement Housing Intimacy Leisure Time Resilience Social Support  ADL's:  Impaired  Cognition: Impaired,  Mild  Sleep:      Medical Decision Making: New problem, with additional work up planned, Review of Psycho-Social Stressors (1), Review or order clinical lab tests (1), Review or order medicine tests (1), Review of Medication Regimen & Side Effects (2) and Review of New Medication or Change in Dosage (2)  Treatment Plan Summary: Daily contact with patient to assess and evaluate symptoms and progress in treatment and Medication management  Plan:  We start Remeron soluble tablet 50 mg at bedtime for depression, anxiety, insomnia and poor appetite Patient does not meet criteria for psychiatric  inpatient admission. Supportive therapy provided about ongoing stressors. Appreciate psychiatric consultation and follow up as clinically required Please contact [phone] or [phone] if needs further assistance  Disposition: Patient may be discharged to home with the outpatient psychiatric services and medically stable.  Orli Degrave,JANARDHAHA R. 07/22/2014 10:31 AM

## 2014-07-22 NOTE — Progress Notes (Signed)
UR completed 
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
## 2014-07-22 NOTE — Evaluation (Signed)
Clinical/Bedside Swallow Evaluation Patient Details  Name: Carl Carey MRN: [id number] Date of Birth: [birth year]  Today's Date: 07/22/2014 Time: SLP Start Time (ACUTE ONLY): 1000 SLP Stop Time (ACUTE ONLY): 1050 SLP Time Calculation (min) (ACUTE ONLY): 50 min  Past Medical History:  Past Medical History  Diagnosis Date  . Panic attack    Past Surgical History:  Past Surgical History  Procedure Laterality Date  . Neck surgery     HPI:  34 year old male admitted 07/21/14 due to difficulty swallowing for the past 6 weeks. PMH significant for panic attacks. MRI, CXR negative. Pt has several dental abscesses, but does not report oral pain.   Assessment / Plan / Recommendation Clinical Impression  Minimal oral care completed with suction. Pt unable to open his mouth wider than ~1cm. Possible involvement of Facial Nerve (CN VII), as evidenced by right facial asymmetry, inability to raise/lower eyebrows, inability to close eyes tightly. Pt able to open and close jaw against resistance, although minimal opening noted, volitional cough strong, minimal pitch change, lingual ROM and strength adequate, speech intelligible aside from stuttering. Pt was given small ice chip, and made no attempt to manipulate, chew, or spit it out. No swallow reflex was elicited, and suction was used to remove melted ice/secretions from oral cavity.     Aspiration Risk  Severe    Diet Recommendation Alternative means - temporary;NPO   Medication Administration: Via alternative means    Other  Recommendations Oral Care Recommendations: Staff/trained caregiver to provide oral care;Oral care Q4 per protocol Other Recommendations: Have oral suction available   Follow Up Recommendations   (TBD)    Frequency and Duration min 2x/week  2 weeks   Pertinent Vitals/Pain Pt reports pain in his shoulders, 8/10. RN informed    SLP Swallow Goals  tolerate po   Swallow Study Prior Functional Status  Available Help at  Discharge: Family;Available PRN/intermittently    General Date of Onset: 07/21/14 HPI: 34 year old male admitted 07/21/14 due to difficulty swallowing for the past 6 weeks. PMH significant for panic attacks. MRI, CXR negative. Pt has several dental abscesses, but does not report oral pain. Type of Study: Bedside swallow evaluation Previous Swallow Assessment: none Diet Prior to this Study: NPO Temperature Spikes Noted: No Respiratory Status: Room air History of Recent Intubation: No Behavior/Cognition: Alert;Cooperative Oral Cavity - Dentition: Adequate natural dentition Self-Feeding Abilities: Total assist Patient Positioning: Upright in chair Baseline Vocal Quality: Clear Volitional Cough: Strong Volitional Swallow: Unable to elicit    Oral/Motor/Sensory Function Overall Oral Motor/Sensory Function: Impaired Labial ROM: Reduced right;Reduced left Labial Symmetry: Abnormal symmetry right Labial Strength: Reduced Labial Sensation: Within Functional Limits Lingual ROM: Within Functional Limits Lingual Symmetry: Within Functional Limits Lingual Strength: Within Functional Limits Lingual Sensation: Within Functional Limits Facial ROM: Reduced right;Reduced left Facial Symmetry: Within Functional Limits (at rest) Facial Strength: Reduced Facial Sensation: Within Functional Limits Velum:  (pt bot able to open mouth wide enough for SLP to visualize) Mandible: Impaired   Ice Chips Ice chips: Impaired Presentation: Spoon Oral Phase Impairments: Reduced labial seal;Impaired anterior to posterior transit Oral Phase Functional Implications: Right anterior spillage;Oral holding;Other (comment) (no obvious oral manipulation of ice chip) Pharyngeal Phase Impairments: Unable to trigger swallow   Thin Liquid Thin Liquid: Not tested    Nectar Thick Nectar Thick Liquid: Not tested   Honey Thick Honey Thick Liquid: Not tested   Puree Puree: Not tested   Solid   GO Functional Assessment  Tool Used: ASHA  NOMS, clinical judgment Functional Limitations: Swallowing Swallow Current Status (I0973): 100 percent impaired, limited or restricted Swallow Goal Status (Z3299): At least 80 percent but less than 100 percent impaired, limited or restricted  Solid: Not tested       Celia B. Quentin Ore [facility], [place] [phone]  Shonna Chock 07/22/2014,10:57 AM

## 2014-07-23 ENCOUNTER — Inpatient Hospital Stay (HOSPITAL_COMMUNITY): Payer: Medicaid Other

## 2014-07-23 DIAGNOSIS — E43 Unspecified severe protein-calorie malnutrition: Secondary | ICD-10-CM | POA: Insufficient documentation

## 2014-07-23 LAB — IRON AND TIBC
Iron: 11 ug/dL — ABNORMAL LOW (ref 42–165)
Saturation Ratios: 4 % — ABNORMAL LOW (ref 20–55)
TIBC: 288 ug/dL (ref 215–435)
UIBC: 277 ug/dL (ref 125–400)

## 2014-07-23 LAB — BASIC METABOLIC PANEL
Anion gap: 10 (ref 5–15)
BUN: <5 mg/dL — ABNORMAL LOW (ref 6–23)
CO2: 27 mmol/L (ref 19–32)
Calcium: 8.3 mg/dL — ABNORMAL LOW (ref 8.4–10.5)
Chloride: 103 mmol/L (ref 96–112)
Creatinine, Ser: 0.84 mg/dL (ref 0.50–1.35)
GFR calc Af Amer: >90 mL/min (ref >90)
GFR calc non Af Amer: >90 mL/min (ref >90)
Glucose, Bld: 80 mg/dL (ref 70–99)
Potassium: 3.9 mmol/L (ref 3.5–5.1)
Sodium: 140 mmol/L (ref 135–145)

## 2014-07-23 LAB — FERRITIN: Ferritin: 26 ng/mL (ref 22–322)

## 2014-07-23 MED ORDER — PANTOPRAZOLE SODIUM 40 MG IV SOLR
40.0000 mg | Freq: Two times a day (BID) | INTRAVENOUS | Status: DC
  Administered 2014-07-23 – 2014-07-25 (×5): 40 mg via INTRAVENOUS
  Filled 2014-07-23 (×6): qty 40

## 2014-07-23 MED ORDER — FLUCONAZOLE 100MG IVPB
100.0000 mg | INTRAVENOUS | Status: DC
  Administered 2014-07-23 – 2014-07-26 (×4): 100 mg via INTRAVENOUS
  Filled 2014-07-23 (×5): qty 50
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
## 2014-07-23 NOTE — Progress Notes (Signed)
Inpatient Rehabilitation  Patient was screened by Gerlean Ren for appropriateness for an Inpatient Acute Rehab consult.  At this time, we are recommending Inpatient Rehab consult.  Please order when you feel appropriate.   [place] Admissions Coordinator Cell [phone] Office [phone]

## 2014-07-23 NOTE — Procedures (Signed)
EEG report.  Brief clinical history:  34 year old male presenting with extrapyramidal symptoms. Etiology unclear.  Technique: this is a 17 channel routine scalp EEG performed at the bedside with bipolar and monopolar montages arranged in accordance to the international 10/20 system of electrode placement. One channel was dedicated to EKG recording.  No drowsiness or stage 2 sleep achieved. Intermittent photic stimulation was utilized as activating procedure.  Description:In the wakeful state, the best background consisted of a medium amplitude, posterior dominant, well sustained, symmetric and reactive 9 Hz rhythm. Intermittent photic stimulation did induce a normal driving response.  No focal or generalized epileptiform discharges noted.  No pathologic areas of slowing seen.  EKG showed sinus rhythm.  Impression: this is a normal awake EEG.  Patient did not sustain his habitual abnormal movements during recording. Please, be aware that a normal EEG does not exclude the possibility of epilepsy.  Clinical correlation is advised.   Dorian Pod, MD

## 2014-07-23 NOTE — Consult Note (Signed)
Psychiatry Consult follow up  Reason for Consult:  Depression, anxiety, bizarre behaviors Referring Physician:  Dr. Eliseo Squires Patient Identification: Carl Carey MRN:  [id number] Principal Diagnosis: Dysphagia Diagnosis:   Patient Active Problem List   Diagnosis Date Noted  . Protein-calorie malnutrition, severe [E43] 07/23/2014  . Parkinsonian features [R25.9] 07/21/2014  . Dysphagia [R13.10] 07/21/2014  . Periapical abscess [K04.7] 07/21/2014  . Microcytic anemia [D50.9] 07/21/2014    Total Time spent with patient: 20 minutes  Subjective:   Carl Carey is a 34 y.o. male patient admitted with generalized weakness, dysphagia and depression, anxiety, suicidal thoughts and behaviors.  HPI:  Carl Carey is an 34 y.o. male seen face-to-face for psychiatric evaluation for depression, anxiety, passive suicidal ideation and old behaviors. Reportedly patient has no previous acute psychiatric hospitalization or treatment. Patient was seen with his mother and a cousin in his room. Patient reported feeling depressed, sad, anxious, worried, weak, not able to walk and not able to swallow solid food. Reportedly he has been drinking fluids over 6-7 weeks. Patient mother and cousin reported that patient has been drifting away from home for the last 7 years and going to different cities like [place], [place] and [place] and living as a homeless. Reportedly he was sometimes stayed in homeless shelter, taking shower once a week and eat food wherever he can get. Patient also reported panhandling money to move around different cities. 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 Patient reported feeling anxiety about not able to swallow food. Patient stated having the suicidal thoughts sometimes but no intention or plans. Patient has no evidence of auditory/visual hallucinations, delusions or paranoia  during this evaluation. Patient denied history of drinking alcohol and abusing illicit drugs.  Interval History: Patient has no complaints and stated that he has not slept well and continue to have generalized weakness and stuttering. He has no significant changes at this time. He was neglected himself too long before arrived to his family. He has no family at bed side today.   Medical history: Patient with a very confusing history. 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 The last the family has seen the patient was about 6-7 years ago. They got a call at the end of the week from [facility] asking them to pick him up. They brought him home where he has been unable to eat because he says he can not swallow. His mother reports that he has been drinking tea. He walks slowly. His speech is slurred and he stutters and can not get a lot of his words out intelligibly. He has been drooling. He reports that he has been this way for about 6 weeks but was able to get on a Grey[employer] bus from [place] to [place] about 2 weeks ago. He was prescribed Reglan and Protonix at [place]. His mother has been administering twice the prescribed dose. He reports he has been on no medications prior to this. He does not report any exposures. He has been living on the street. When they last saw the patient 6-7 years ago he was normal.  HPI Elements:   Location:  Depression and anxiety. Quality:  Unable to care for himself. Severity:  Unable to swallow which made him more depressed and anxious. Timing:  6 weeks. 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 Context:  Severe psychosocial stresses.  Past Medical History:  Past Medical History  Diagnosis Date  . Panic attack     Past Surgical History  Procedure Laterality Date  . Neck surgery     Family History:  Family History  Problem Relation Age of Onset  . Hypertension Maternal Grandmother     Social History:  History  Alcohol Use No     History  Drug Use No    History   Social History  . Marital Status: Single    Spouse Name: N/A  . Number of Children: N/A  . Years of Education: N/A   Social History Main Topics  . Smoking status: Never Smoker   . Smokeless tobacco: Not on file  . Alcohol Use: No  . Drug Use: No  . Sexual Activity: Not on file   Other Topics Concern  . None   Social History Narrative  . None   Additional Social History:                          Allergies:  No Known Allergies  Labs:  Results for orders placed or performed during the hospital encounter of 07/21/14 (from the past 48 hour(s))  CBC with Differential/Platelet     Status: Abnormal   Collection Time: 07/21/14  4:19 PM  Result Value Ref Range   WBC 6.0 4.0 - 10.5 K/uL   RBC 4.99 4.22 - 5.81 MIL/uL   Hemoglobin 11.7 (L) 13.0 - 17.0 g/dL   HCT 37.3 (L) 39.0 - 52.0 %   MCV 74.7 (L) 78.0 - 100.0 fL   MCH 23.4 (L) 26.0 - 34.0 pg   MCHC 31.4 30.0 - 36.0 g/dL   RDW 17.0 (H) 11.5 - 15.5 %   Platelets 360 150 - 400 K/uL   Neutrophils Relative % 77 43 - 77 %   Neutro Abs 4.6 1.7 - 7.7 K/uL   Lymphocytes Relative 6 (L) 12 - 46 %   Lymphs Abs 0.4 (L) 0.7 - 4.0 K/uL   Monocytes Relative 14 (H) 3 - 12 %   Monocytes Absolute 0.8 0.1 - 1.0 K/uL   Eosinophils Relative 2 0 - 5 %   Eosinophils Absolute 0.1 0.0 - 0.7 K/uL   Basophils Relative 1 0 - 1 %   Basophils Absolute 0.1 0.0 - 0.1 K/uL  Comprehensive metabolic panel     Status: Abnormal   Collection Time: 07/21/14  4:19 PM  Result Value Ref Range   Sodium 139 135 - 145 mmol/L   Potassium 3.1 (L) 3.5 - 5.1 mmol/L   Chloride 103 96 - 112 mmol/L   CO2 29 19 - 32 mmol/L   Glucose, Bld 90 70 - 99 mg/dL   BUN <5 (L) 6 - 23 mg/dL   Creatinine, Ser 0.81 0.50 - 1.35 mg/dL   Calcium 8.8 8.4 - 10.5 mg/dL   Total Protein 6.0 6.0 - 8.3 g/dL   Albumin 2.9 (L) 3.5 - 5.2 g/dL   AST 19 0 - 37 U/L   ALT 13 0 - 53 U/L   Alkaline  Phosphatase 62 39 - 117 U/L   Total Bilirubin 0.7 0.3 - 1.2 mg/dL   GFR calc non Af Amer >90 >90 mL/min   GFR calc Af Amer >90 >90 mL/min    Comment: (NOTE) The eGFR has been calculated using the CKD EPI equation. This calculation has not been validated in all clinical  situations. eGFR's persistently <90 mL/min signify possible Chronic Kidney Disease.    Anion gap 7 5 - 15  CK     Status: Abnormal   Collection Time: 07/21/14  4:19 PM  Result Value Ref Range   Total CK 341 (H) 7 - 232 U/L  TSH     Status: None   Collection Time: 07/21/14  4:19 PM  Result Value Ref Range   TSH 0.945 0.350 - 4.500 uIU/mL  Magnesium     Status: None   Collection Time: 07/21/14  4:19 PM  Result Value Ref Range   Magnesium 1.8 1.5 - 2.5 mg/dL  Urinalysis, Routine w reflex microscopic     Status: Abnormal   Collection Time: 07/21/14  4:45 PM  Result Value Ref Range   Color, Urine YELLOW YELLOW   APPearance CLOUDY (A) CLEAR   Specific Gravity, Urine 1.017 1.005 - 1.030   pH 7.0 5.0 - 8.0   Glucose, UA NEGATIVE NEGATIVE mg/dL   Hgb urine dipstick NEGATIVE NEGATIVE   Bilirubin Urine NEGATIVE NEGATIVE   Ketones, ur 15 (A) NEGATIVE mg/dL   Protein, ur NEGATIVE NEGATIVE mg/dL   Urobilinogen, UA 1.0 0.0 - 1.0 mg/dL   Nitrite NEGATIVE NEGATIVE   Leukocytes, UA NEGATIVE NEGATIVE    Comment: MICROSCOPIC NOT DONE ON URINES WITH NEGATIVE PROTEIN, BLOOD, LEUKOCYTES, NITRITE, OR GLUCOSE <1000 mg/dL.  Urine rapid drug screen (hosp performed)     Status: None   Collection Time: 07/21/14  4:45 PM  Result Value Ref Range   Opiates NONE DETECTED NONE DETECTED   Cocaine NONE DETECTED NONE DETECTED   Benzodiazepines NONE DETECTED NONE DETECTED   Amphetamines NONE DETECTED NONE DETECTED   Tetrahydrocannabinol NONE DETECTED NONE DETECTED   Barbiturates NONE DETECTED NONE DETECTED    Comment:        DRUG SCREEN FOR MEDICAL PURPOSES ONLY.  IF CONFIRMATION IS NEEDED FOR ANY PURPOSE, NOTIFY LAB WITHIN 5 DAYS.         LOWEST DETECTABLE LIMITS FOR URINE DRUG SCREEN Drug Class       Cutoff (ng/mL) Amphetamine      1000 Barbiturate      200 Benzodiazepine   914 Tricyclics       782 Opiates          300 Cocaine          300 THC              50   Sedimentation rate     Status: None   Collection Time: 07/21/14  9:55 PM  Result Value Ref Range   Sed Rate 9 0 - 16 mm/hr  HIV antibody     Status: None   Collection Time: 07/21/14  9:55 PM  Result Value Ref Range   HIV Screen 4th Generation wRfx Non Reactive Non Reactive    Comment: (NOTE) Performed At: [facility] [place], [place] [id number] Lindon Romp MD VH:[id number]   Vitamin B12     Status: None   Collection Time: 07/21/14  9:55 PM  Result Value Ref Range   Vitamin B-12 492 211 - 911 pg/mL    Comment: Performed at [facility]  Folate     Status: None   Collection Time: 07/21/14  9:55 PM  Result Value Ref Range   Folate 17.9 ng/mL    Comment: (NOTE) Reference Ranges        Deficient:       0.4 - 3.3 ng/mL  Indeterminate:   3.4 - 5.4 ng/mL        Normal:              > 5.4 ng/mL Performed at [facility]   RPR     Status: None   Collection Time: 07/21/14  9:55 PM  Result Value Ref Range   RPR Ser Ql Non Reactive Non Reactive    Comment: (NOTE) Performed At: [facility] [place], [place] [id number] Lindon Romp MD [id number]   MRSA PCR Screening     Status: None   Collection Time: 07/21/14 11:55 PM  Result Value Ref Range   MRSA by PCR NEGATIVE NEGATIVE    Comment:        The GeneXpert MRSA Assay (FDA approved for NASAL specimens only), is one component of a comprehensive MRSA colonization surveillance program. It is not intended to diagnose MRSA infection nor to guide or monitor treatment for MRSA infections.   Troponin I (q 6hr x 3)     Status: None   Collection Time: 07/22/14 12:13 AM  Result Value Ref Range   Troponin I <0.03 <0.031  ng/mL    Comment:        NO INDICATION OF MYOCARDIAL INJURY.   Iron and TIBC     Status: Abnormal   Collection Time: 07/22/14  5:55 AM  Result Value Ref Range   Iron 11 (L) 42 - 165 ug/dL   TIBC 288 215 - 435 ug/dL   Saturation Ratios 4 (L) 20 - 55 %   UIBC 277 125 - 400 ug/dL    Comment: Performed at [facility]  Ferritin     Status: None   Collection Time: 07/22/14  5:55 AM  Result Value Ref Range   Ferritin 26 22 - 322 ng/mL    Comment: Performed at [facility]  Reticulocytes     Status: None   Collection Time: 07/22/14  5:55 AM  Result Value Ref Range   Retic Ct Pct 1.1 0.4 - 3.1 %   RBC. 4.71 4.22 - 5.81 MIL/uL   Retic Count, Manual 51.8 19.0 - 186.0 K/uL  Comprehensive metabolic panel     Status: Abnormal   Collection Time: 07/22/14  5:55 AM  Result Value Ref Range   Sodium 140 135 - 145 mmol/L   Potassium 3.2 (L) 3.5 - 5.1 mmol/L   Chloride 104 96 - 112 mmol/L   CO2 27 19 - 32 mmol/L   Glucose, Bld 98 70 - 99 mg/dL   BUN <5 (L) 6 - 23 mg/dL   Creatinine, Ser 0.94 0.50 - 1.35 mg/dL   Calcium 8.5 8.4 - 10.5 mg/dL   Total Protein 5.7 (L) 6.0 - 8.3 g/dL   Albumin 2.7 (L) 3.5 - 5.2 g/dL   AST 18 0 - 37 U/L   ALT 11 0 - 53 U/L   Alkaline Phosphatase 59 39 - 117 U/L   Total Bilirubin 1.1 0.3 - 1.2 mg/dL   GFR calc non Af Amer >90 >90 mL/min   GFR calc Af Amer >90 >90 mL/min    Comment: (NOTE) The eGFR has been calculated using the CKD EPI equation. This calculation has not been validated in all clinical situations. eGFR's persistently <90 mL/min signify possible Chronic Kidney Disease.    Anion gap 9 5 - 15  CBC with Differential/Platelet     Status: Abnormal   Collection Time: 07/22/14  5:55 AM  Result Value Ref Range  WBC 5.0 4.0 - 10.5 K/uL   RBC 4.71 4.22 - 5.81 MIL/uL   Hemoglobin 11.1 (L) 13.0 - 17.0 g/dL   HCT 35.4 (L) 39.0 - 52.0 %   MCV 75.2 (L) 78.0 - 100.0 fL   MCH 23.6 (L) 26.0 - 34.0 pg   MCHC 31.4 30.0 - 36.0 g/dL   RDW 17.3  (H) 11.5 - 15.5 %   Platelets 361 150 - 400 K/uL   Neutrophils Relative % 67 43 - 77 %   Neutro Abs 3.3 1.7 - 7.7 K/uL   Lymphocytes Relative 11 (L) 12 - 46 %   Lymphs Abs 0.6 (L) 0.7 - 4.0 K/uL   Monocytes Relative 18 (H) 3 - 12 %   Monocytes Absolute 0.9 0.1 - 1.0 K/uL   Eosinophils Relative 3 0 - 5 %   Eosinophils Absolute 0.1 0.0 - 0.7 K/uL   Basophils Relative 1 0 - 1 %   Basophils Absolute 0.0 0.0 - 0.1 K/uL  TSH     Status: None   Collection Time: 07/22/14  5:55 AM  Result Value Ref Range   TSH 0.890 0.350 - 4.500 uIU/mL  Troponin I (q 6hr x 3)     Status: None   Collection Time: 07/22/14  5:55 AM  Result Value Ref Range   Troponin I <0.03 <0.031 ng/mL    Comment:        NO INDICATION OF MYOCARDIAL INJURY.   CK     Status: None   Collection Time: 07/22/14  5:55 AM  Result Value Ref Range   Total CK 218 7 - 232 U/L  Troponin I (q 6hr x 3)     Status: None   Collection Time: 07/22/14 11:15 AM  Result Value Ref Range   Troponin I <0.03 <0.031 ng/mL    Comment:        NO INDICATION OF MYOCARDIAL INJURY.     Vitals: Blood pressure 123/77, pulse 70, temperature 98.6 F (37 C), temperature source Oral, resp. rate 17, height _0  (1.727 m), weight 62.596 kg (138 lb), SpO2 100 %.  Risk to Self: Is patient at risk for suicide?: No Risk to Others:   Prior Inpatient Therapy:   Prior Outpatient Therapy:    Current Facility-Administered Medications  Medication Dose Route Frequency Provider Last Rate Last Dose  . acetaminophen (TYLENOL) tablet 650 mg  650 mg Oral Q6H PRN Rise Patience, MD       Or  . acetaminophen (TYLENOL) suppository 650 mg  650 mg Rectal Q6H PRN Rise Patience, MD      . Ampicillin-Sulbactam (UNASYN) 3 g in sodium chloride 0.9 % 100 mL IVPB  3 g Intravenous Q6H Rise Patience, MD   3 g at 07/23/14 0537  . antiseptic oral rinse (CPC / CETYLPYRIDINIUM CHLORIDE 0.05%) solution 7 mL  7 mL Mouth Rinse BID Rise Patience, MD   7 mL at  07/23/14 1108  . fluconazole (DIFLUCAN) IVPB 100 mg  100 mg Intravenous Q24H Geradine Girt, DO   100 mg at 07/23/14 1123  . Influenza vac split quadrivalent PF (FLUARIX) injection 0.5 mL  0.5 mL Intramuscular Tomorrow-1000 Jessica U Vann, DO      . mirtazapine (REMERON SOL-TAB) disintegrating tablet 15 mg  15 mg Oral QHS Ambrose Finland, MD   15 mg at 07/22/14 2224  . ondansetron (ZOFRAN) tablet 4 mg  4 mg Oral Q6H PRN Rise Patience, MD  Or  . ondansetron (ZOFRAN) injection 4 mg  4 mg Intravenous Q6H PRN Rise Patience, MD      . pantoprazole (PROTONIX) injection 40 mg  40 mg Intravenous Q12H Geradine Girt, DO   40 mg at 07/23/14 1109    Musculoskeletal: Strength & Muscle Tone: decreased Gait & Station: unable to stand Patient leans: N/A  Psychiatric Specialty Exam: Physical Exam  ROS  Blood pressure 123/77, pulse 70, temperature 98.6 F (37 C), temperature source Oral, resp. rate 17, height _0  (1.727 m), weight 62.596 kg (138 lb), SpO2 100 %.Body mass index is 20.99 kg/(m^2).  General Appearance: Guarded  Eye Contact::  Fair  Speech:  Slurred and stuttering  Volume:  Decreased  Mood:  Anxious and Depressed  Affect:  Constricted and Depressed  Thought Process:  Coherent and Goal Directed  Orientation:  Full (Time, Place, and Person)  Thought Content:  WDL  Suicidal Thoughts:  No  Homicidal Thoughts:  No  Memory:  Immediate;   Fair Recent;   Fair  Judgement:  Impaired  Insight:  Shallow  Psychomotor Activity:  Decreased  Concentration:  Fair  Recall:  Good  Fund of Knowledge:Good  Language: Good  Akathisia:  NA  Handed:  Right  AIMS (if indicated):     Assets:  Communication Skills Desire for Improvement Housing Intimacy Leisure Time Resilience Social Support  ADL's:  Impaired  Cognition: Impaired,  Mild  Sleep:      Medical Decision Making: New problem, with additional work up planned, Review of Psycho-Social Stressors (1), Review or  order clinical lab tests (1), Review or order medicine tests (1), Review of Medication Regimen & Side Effects (2) and Review of New Medication or Change in Dosage (2)  Treatment Plan Summary: Daily contact with patient to assess and evaluate symptoms and progress in treatment and Medication management  Plan: Case discussed with Dr. Eliseo Squires  Continue Remeron soluble tablet 15 mg at bedtime for depression, insomnia and poor appetite Patient does not meet criteria for psychiatric inpatient admission. Supportive therapy provided about ongoing stressors. Appreciate psychiatric consultation and follow up as clinically required Please contact [phone] or [phone] if needs further assistance  Disposition: Patient may be discharged to home with the outpatient psychiatric services and medically stable.  Noelie Renfrow,JANARDHAHA R. 07/23/2014 2:54 PM

## 2014-07-23 NOTE — Progress Notes (Signed)
EEG Completed; Results Pending  

## 2014-07-23 NOTE — Progress Notes (Addendum)
Speech Language Pathology Treatment: Dysphagia  Patient Details Name: Carl Carey MRN: [id number] DOB: October 29, 1980 Today's Date: 07/23/2014 Time: 7290-2111 SLP Time Calculation (min) (ACUTE ONLY): 45 min  Assessment / Plan / Recommendation Clinical Impression  F/U swallow assessment to determine if pt is able to initiate po's.  Pt presents with unusual deficits (? Bulbar onset involving speech and swallowing , as in possible ALS or MG? Vs. Psychogenic disorder).  Oral-motor deficits evident, with fasciculations noted in the tongue, and rapid tremor type movement of the lower lip and chin.  Pt has difficulty with mandibular abduction and tongue protrusion; can lateralize tongue, but very slowly.  Pt was able to chew an ice chip and swallowed after oral delay.  Pt states he is hungry, and stated "Let's try food," but was unable to chew a small bite of sandwich, and SLP ultimately had to remove the solid bolus.  Pt holds purees orally, until a sip of liquid is given from a straw, then pt triggers the swallow (similar to what is frequent seen in pt's with advanced dementia).  There were no overt s/s of aspiration during this session, and pt was not drooling today, as was described yesterday.    Speech is extremely slow and dysfluent, with prolongations and repetiition of the initial syllable of each utterance.  Sudden onset of stuttering type behavior in an adult is typically psychogenic in nature, unless accompanied by stroke.  MRI and CT of the neck were negative.  Psychiatry has evaluated the pt and is recommending home with OP psych services.   HPI HPI: 34 year old male admitted 07/21/14 due to difficulty swallowing for the past 6 weeks. PMH significant for panic attacks. MRI, CXR negative. Pt has several dental abscesses, but does not report oral pain.   Pertinent Vitals Pain Assessment: No/denies pain  SLP Plan  Goals updated    Recommendations Diet recommendations: Thin liquid (Full liquid  diet) Liquids provided via: Straw Medication Administration: Via alternative means (may try crushed with puree when IV is d/c'd) Supervision: Full supervision/cueing for compensatory strategies;Staff to assist with self feeding Compensations: Slow rate;Small sips/bites;Check for pocketing Postural Changes and/or Swallow Maneuvers: Seated upright 90 degrees              General recommendations: Rehab consult Oral Care Recommendations: Staff/trained caregiver to provide oral care;Oral care Q4 per protocol Follow up Recommendations: Inpatient Rehab Plan: Goals updated    GO     Quinn Axe T 07/23/2014, 1:18 PM
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
## 2014-07-24 LAB — CBC
HCT: 34.5 % — ABNORMAL LOW (ref 39.0–52.0)
Hemoglobin: 10.7 g/dL — ABNORMAL LOW (ref 13.0–17.0)
MCH: 23.3 pg — ABNORMAL LOW (ref 26.0–34.0)
MCHC: 31 g/dL (ref 30.0–36.0)
MCV: 75.2 fL — ABNORMAL LOW (ref 78.0–100.0)
Platelets: 334 10*3/uL (ref 150–400)
RBC: 4.59 MIL/uL (ref 4.22–5.81)
RDW: 17.2 % — ABNORMAL HIGH (ref 11.5–15.5)
WBC: 4.6 10*3/uL (ref 4.0–10.5)

## 2014-07-24 LAB — BASIC METABOLIC PANEL
Anion gap: 4 — ABNORMAL LOW (ref 5–15)
BUN: <5 mg/dL — ABNORMAL LOW (ref 6–23)
CO2: 29 mmol/L (ref 19–32)
Calcium: 8.1 mg/dL — ABNORMAL LOW (ref 8.4–10.5)
Chloride: 105 mmol/L (ref 96–112)
Creatinine, Ser: 0.82 mg/dL (ref 0.50–1.35)
GFR calc Af Amer: >90 mL/min (ref >90)
GFR calc non Af Amer: >90 mL/min (ref >90)
Glucose, Bld: 73 mg/dL (ref 70–99)
Potassium: 3.6 mmol/L (ref 3.5–5.1)
Sodium: 138 mmol/L (ref 135–145)

## 2014-07-24 LAB — MAGNESIUM: Magnesium: 1.9 mg/dL (ref 1.5–2.5)

## 2014-07-24 LAB — VITAMIN B1: Vitamin B1 (Thiamine): 83.8 nmol/L (ref 66.5–200.0)

## 2014-07-24 LAB — PHOSPHORUS: Phosphorus: 3.7 mg/dL (ref 2.3–4.6)

## 2014-07-24 MED ORDER — MIRTAZAPINE 30 MG PO TBDP
30.0000 mg | ORAL_TABLET | Freq: Every day | ORAL | Status: DC
  Administered 2014-07-24 – 2014-07-28 (×5): 30 mg via ORAL
  Filled 2014-07-24 (×6): qty 1

## 2014-07-24 MED ORDER — SODIUM CHLORIDE 0.9 % IV SOLN
510.0000 mg | Freq: Once | INTRAVENOUS | Status: AC
  Administered 2014-07-24: 510 mg via INTRAVENOUS
  Filled 2014-07-24 (×2): qty 17

## 2014-07-24 MED ORDER — ENSURE ENLIVE PO LIQD
237.0000 mL | Freq: Three times a day (TID) | ORAL | Status: DC
  Administered 2014-07-24 – 2014-07-28 (×11): 237 mL via ORAL
  Filled 2014-07-24: qty 237

## 2014-07-24 MED ORDER — NYSTATIN 100000 UNIT/ML MT SUSP
5.0000 mL | Freq: Four times a day (QID) | OROMUCOSAL | Status: DC
  Administered 2014-07-24 – 2014-07-28 (×17): 500000 [IU] via ORAL
  Filled 2014-07-24 (×20): qty 5

## 2014-07-24 MED ORDER — POLYVINYL ALCOHOL 1.4 % OP SOLN
1.0000 [drp] | OPHTHALMIC | Status: DC | PRN
  Filled 2014-07-24: qty 15

## 2014-07-24 NOTE — Consult Note (Signed)
Psychiatry Consult follow up  Reason for Consult:  Depression, anxiety, bizarre behaviors Referring Physician:  Dr. Eliseo Squires Patient Identification: Carl Carey MRN:  [id number] Principal Diagnosis: Dysphagia Diagnosis:   Patient Active Problem List   Diagnosis Date Noted  . Protein-calorie malnutrition, severe [E43] 07/23/2014  . Parkinsonian features [R25.9] 07/21/2014  . Dysphagia [R13.10] 07/21/2014  . Periapical abscess [K04.7] 07/21/2014  . Microcytic anemia [D50.9] 07/21/2014    Total Time spent with patient: 20 minutes  Subjective:   Carl Carey is a 34 y.o. male patient admitted with generalized weakness, dysphagia and depression, anxiety, suicidal thoughts and behaviors.  HPI:  Carl Carey is an 34 y.o. male seen face-to-face for psychiatric evaluation for depression, anxiety, passive suicidal ideation and old behaviors. Reportedly patient has no previous acute psychiatric hospitalization or treatment. Patient was seen with his mother and a cousin in his room. Patient reported feeling depressed, sad, anxious, worried, weak, not able to walk and not able to swallow solid food. Reportedly he has been drinking fluids over 6-7 weeks. Patient mother and cousin reported that patient has been drifting away from home for the last 7 years and going to different cities like [place], [place] and [place] and living as a homeless. Reportedly he was sometimes stayed in homeless shelter, taking shower once a week and eat food wherever he can get. Patient also reported panhandling money to move around different cities. 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 Patient reported feeling anxiety about not able to swallow food. Patient stated having the suicidal thoughts sometimes but no intention or plans. Patient has no evidence of auditory/visual hallucinations, delusions or paranoia  during this evaluation. Patient denied history of drinking alcohol and abusing illicit drugs.  July 24, 2014  Interval History: Patient seen and chart reviewed and case discussed with Dr. Eliseo Squires. Patient stated that he is able to swallow liquids much better now than before. Patient continued to report not sleeping well and being compliant with his medication and services provided to him. Patient continue to have generalized weakness and stuttering while talking. He was neglected himself too long before arrived to his family. He has no family at bed side today. Patient has normal limits of heavy metals and he is not meeting criteria for rehabilitation.   Medical history: Patient with a very confusing history. 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 The last the family has seen the patient was about 6-7 years ago. They got a call at the end of the week from [facility] asking them to pick him up. They brought him home where he has been unable to eat because he says he can not swallow. His mother reports that he has been drinking tea. He walks slowly. His speech is slurred and he stutters and can not get a lot of his words out intelligibly. He has been drooling. He reports that he has been this way for about 6 weeks but was able to get on a [employer] bus from [place] to [place] about 2 weeks ago. He was prescribed Reglan and Protonix at [place]. His mother has been administering twice the prescribed dose. He reports he has been on no medications prior to this. He does not report any exposures. He has been living on the street. When they  last saw the patient 6-7 years ago he was normal.   Past Medical History:  Past Medical History  Diagnosis Date  . Panic attack     Past Surgical History  Procedure Laterality Date  . Neck surgery     Family History:  Family History  Problem Relation Age of Onset  . Hypertension Maternal  Grandmother    Social History:  History  Alcohol Use No     History  Drug Use No    History   Social History  . Marital Status: Single    Spouse Name: N/A  . Number of Children: N/A  . Years of Education: N/A   Social History Main Topics  . Smoking status: Never Smoker   . Smokeless tobacco: Not on file  . Alcohol Use: No  . Drug Use: No  . Sexual Activity: Not on file   Other Topics Concern  . None   Social History Narrative  . None   Additional Social History:                          Allergies:  No Known Allergies  Labs:  Results for orders placed or performed during the hospital encounter of 07/21/14 (from the past 48 hour(s))  Basic metabolic panel     Status: Abnormal   Collection Time: 07/23/14  4:20 PM  Result Value Ref Range   Sodium 140 135 - 145 mmol/L   Potassium 3.9 3.5 - 5.1 mmol/L    Comment: DELTA CHECK NOTED   Chloride 103 96 - 112 mmol/L   CO2 27 19 - 32 mmol/L   Glucose, Bld 80 70 - 99 mg/dL   BUN <5 (L) 6 - 23 mg/dL   Creatinine, Ser 0.84 0.50 - 1.35 mg/dL   Calcium 8.3 (L) 8.4 - 10.5 mg/dL   GFR calc non Af Amer >90 >90 mL/min   GFR calc Af Amer >90 >90 mL/min    Comment: (NOTE) The eGFR has been calculated using the CKD EPI equation. This calculation has not been validated in all clinical situations. eGFR's persistently <90 mL/min signify possible Chronic Kidney Disease.    Anion gap 10 5 - 15  Magnesium     Status: None   Collection Time: 07/24/14  4:42 AM  Result Value Ref Range   Magnesium 1.9 1.5 - 2.5 mg/dL  Phosphorus     Status: None   Collection Time: 07/24/14  4:42 AM  Result Value Ref Range   Phosphorus 3.7 2.3 - 4.6 mg/dL  CBC     Status: Abnormal   Collection Time: 07/24/14  4:47 AM  Result Value Ref Range   WBC 4.6 4.0 - 10.5 K/uL   RBC 4.59 4.22 - 5.81 MIL/uL   Hemoglobin 10.7 (L) 13.0 - 17.0 g/dL   HCT 34.5 (L) 39.0 - 52.0 %   MCV 75.2 (L) 78.0 - 100.0 fL   MCH 23.3 (L) 26.0 - 34.0 pg   MCHC 31.0  30.0 - 36.0 g/dL   RDW 17.2 (H) 11.5 - 15.5 %   Platelets 334 150 - 400 K/uL  Basic metabolic panel     Status: Abnormal   Collection Time: 07/24/14  4:47 AM  Result Value Ref Range   Sodium 138 135 - 145 mmol/L   Potassium 3.6 3.5 - 5.1 mmol/L   Chloride 105 96 - 112 mmol/L   CO2 29 19 - 32 mmol/L   Glucose, Bld 73 70 -  99 mg/dL   BUN <5 (L) 6 - 23 mg/dL   Creatinine, Ser 0.82 0.50 - 1.35 mg/dL   Calcium 8.1 (L) 8.4 - 10.5 mg/dL   GFR calc non Af Amer >90 >90 mL/min   GFR calc Af Amer >90 >90 mL/min    Comment: (NOTE) The eGFR has been calculated using the CKD EPI equation. This calculation has not been validated in all clinical situations. eGFR's persistently <90 mL/min signify possible Chronic Kidney Disease.    Anion gap 4 (L) 5 - 15    Vitals: Blood pressure 116/60, pulse 54, temperature 98 F (36.7 C), temperature source Oral, resp. rate 18, height 5' 8"  (1.727 m), weight 59.512 kg (131 lb 3.2 oz), SpO2 100 %.  Risk to Self: Is patient at risk for suicide?: No Risk to Others:   Prior Inpatient Therapy:   Prior Outpatient Therapy:    Current Facility-Administered Medications  Medication Dose Route Frequency Provider Last Rate Last Dose  . acetaminophen (TYLENOL) tablet 650 mg  650 mg Oral Q6H PRN Rise Patience, MD       Or  . acetaminophen (TYLENOL) suppository 650 mg  650 mg Rectal Q6H PRN Rise Patience, MD      . Ampicillin-Sulbactam (UNASYN) 3 g in sodium chloride 0.9 % 100 mL IVPB  3 g Intravenous Q6H Rise Patience, MD   3 g at 07/24/14 1141  . antiseptic oral rinse (CPC / CETYLPYRIDINIUM CHLORIDE 0.05%) solution 7 mL  7 mL Mouth Rinse BID Rise Patience, MD   7 mL at 07/24/14 1131  . feeding supplement (ENSURE ENLIVE) (ENSURE ENLIVE) liquid 237 mL  237 mL Oral TID BM Kallie Locks, RD   237 mL at 07/24/14 1449  . ferumoxytol (FERAHEME) 510 mg in sodium chloride 0.9 % 100 mL IVPB  510 mg Intravenous Once Geradine Girt, DO      . fluconazole  (DIFLUCAN) IVPB 100 mg  100 mg Intravenous Q24H Geradine Girt, DO   100 mg at 07/24/14 1450  . Influenza vac split quadrivalent PF (FLUARIX) injection 0.5 mL  0.5 mL Intramuscular Tomorrow-1000 Jessica U Vann, DO      . mirtazapine (REMERON SOL-TAB) disintegrating tablet 30 mg  30 mg Oral QHS Ambrose Finland, MD      . nystatin (MYCOSTATIN) 100000 UNIT/ML suspension 500,000 Units  5 mL Oral QID Geradine Girt, DO   500,000 Units at 07/24/14 1450  . ondansetron (ZOFRAN) tablet 4 mg  4 mg Oral Q6H PRN Rise Patience, MD       Or  . ondansetron [facility]) injection 4 mg  4 mg Intravenous Q6H PRN Rise Patience, MD      . pantoprazole (PROTONIX) injection 40 mg  40 mg Intravenous Q12H Jessica U Vann, DO   40 mg at 07/24/14 1133  . polyvinyl alcohol (LIQUIFILM TEARS) 1.4 % ophthalmic solution 1 drop  1 drop Both Eyes PRN Geradine Girt, DO        Musculoskeletal: Strength & Muscle Tone: decreased Gait & Station: unable to stand Patient leans: N/A  Psychiatric Specialty Exam: Physical Exam  ROS  Blood pressure 116/60, pulse 54, temperature 98 F (36.7 C), temperature source Oral, resp. rate 18, height 5' 8"  (1.727 m), weight 59.512 kg (131 lb 3.2 oz), SpO2 100 %.Body mass index is 19.95 kg/(m^2).  General Appearance: Guarded  Eye Contact::  Fair  Speech:  Slurred and stuttering  Volume:  Decreased  Mood:  Anxious  and Depressed  Affect:  Constricted and Depressed  Thought Process:  Coherent and Goal Directed  Orientation:  Full (Time, Place, and Person)  Thought Content:  WDL  Suicidal Thoughts:  No  Homicidal Thoughts:  No  Memory:  Immediate;   Fair Recent;   Fair  Judgement:  Impaired  Insight:  Shallow  Psychomotor Activity:  Decreased  Concentration:  Fair  Recall:  Good  Fund of Knowledge:Good  Language: Good  Akathisia:  NA  Handed:  Right  AIMS (if indicated):     Assets:  Communication Skills Desire for Improvement Housing Intimacy Leisure  Time Resilience Social Support  ADL's:  Impaired  Cognition: Impaired,  Mild  Sleep:      Medical Decision Making: New problem, with additional work up planned, Review of Psycho-Social Stressors (1), Review or order clinical lab tests (1), Review or order medicine tests (1), Review of Medication Regimen & Side Effects (2) and Review of New Medication or Change in Dosage (2)  Treatment Plan Summary: Daily contact with patient to assess and evaluate symptoms and progress in treatment and Medication management  Plan: Case discussed with Dr. Eliseo Squires  Increase Remeron soluble tablet 30 mg at bedtime for depression, insomnia and poor appetite Patient does not meet criteria for psychiatric inpatient admission. Supportive therapy provided about ongoing stressors. Appreciate psychiatric consultation and follow up as clinically required Please contact [phone] or [phone] if needs further assistance  Disposition: Patient may be discharged to home with the outpatient psychiatric services and medically stable.  Munirah Doerner,JANARDHAHA R. 07/24/2014 2:55 PM

## 2014-07-24 NOTE — Progress Notes (Signed)
Rehab admissions - I met with pt in follow up to rehab MD consult and shared that rehab MD is recommending home with home health support or SNF.   Per Dr. Naaman Plummer, "Unclear etiology to his symptoms. Fairly high level already.  ?social supports. Not sure where inpatient rehab fits in here."  Pt had a difficult time communicating with me and I explained that case manager/social worker would follow up with him to help with discharge planning.  I updated Malachy Mood, case Freight forwarder and Lorriane Shire, Education officer, museum as well.  I will now sign off pt's case. Thanks.  Nanetta Batty, PT Rehabilitation Admissions Coordinator [phone]

## 2014-07-24 NOTE — Progress Notes (Signed)
OT Treatment Note    07/23/14 1959  OT Time Calculation  OT Start Time (ACUTE ONLY) 1658  OT Stop Time (ACUTE ONLY) 1712  OT Time Calculation (min) 14 min  Precautions  Precautions Fall  Pain Assessment  Pain Assessment Faces  Faces Pain Scale 4  Pain Location did not say  Pain Intervention(s) Monitored during session  Cognition  Arousal/Alertness Awake/alert  Behavior During Therapy Flat affect  Overall Cognitive Status Impaired/Different from baseline  Area of Impairment Attention;Following commands;Safety/judgement;Awareness;Problem solving  Current Attention Level Selective  Following Commands Follows one step commands with increased time  Safety/Judgement Decreased awareness of deficits  Awareness Intellectual  Problem Solving Slow processing;Decreased initiation;Difficulty sequencing;Requires verbal cues;Requires tactile cues  General Comments Difficulty terminating task.required tactile cues to continue/terminate task  ADL  Eating/Feeding Supervision/ safety (puree/full S)  Grooming Minimal assistance  Grooming Details (indicate cue type and reason) contined cues to wash hands. put soap on hands and then requred cues to use water to wash off soap  Toilet Transfer Min guard  Toileting- Clothing Manipulation and Hygiene Min guard  Functional mobility during ADLs Min guard  General ADL Comments vc and tactile cues to complete task. ;when washing, pt would put cloth to area and then required tactile cues to continue to wash/complete task  Bed Mobility  Supine to sit Supervision  Sit to supine Supervision  Balance  Sitting balance-Leahy Scale Good  Standing balance-Leahy Scale Fair  Restrictions  Weight Bearing Restrictions No  Transfers  Sit to Stand Supervision  Stand pivot transfers Min guard  OT - End of Session  Activity Tolerance Patient tolerated treatment well  Patient left in bed;with call bell/phone within reach;with bed alarm set;with family/visitor  present  Nurse Communication Mobility status  OT Assessment/Plan  OT Plan Discharge plan remains appropriate  OT Frequency (ACUTE ONLY) Min 2X/week  Follow Up Recommendations Home health OT;Supervision/Assistance - 24 hour  OT Equipment 3 in 1 bedside comode  OT Goal Progression  Progress towards OT goals Progressing toward goals  Acute Rehab OT Goals  Patient Stated Goal Pt did not state goals.   OT Goal Formulation With patient/family  Time For Goal Achievement 08/05/14  Potential to Achieve Goals Good  OT General Charges  $OT Visit 1 Procedure  OT Treatments  $Self Care/Home Management  8-22 mins  [facility], OTR/L  [phone] 07/23/2014

## 2014-07-24 NOTE — Progress Notes (Signed)
Physical Therapy Treatment Patient Details Name: Carl Carey MRN: [id number] DOB: Jun 08, 1980 Today's Date: 07/24/2014    History of Present Illness Pt is a 34 y.o. male with no significant PMH has been experiencing difficulty swallowing over the last 6 weeks. Patient was recently brought to home by patient's family from [place]. Per chart, Patient finds it difficult to swallow. In addition patient has been having difficulty walking with gait difficulties and tremors. In the ER patient had CT maxillofacial and soft tissue neck and MRI brain. Nothing acute was seen but did show some periapical abscess. Labs show anemia. Work up underway.    PT Comments    Pt progressing slowly towards physical therapy goals. Upper body appeared less stiff today, however continues to demonstrate zero trunk rotation or arm swing during gait training. Pt is at risk for falls as compensatory strategies are decreased in ankles and hips; also due to poor awareness of obstacles while ambulating in the halls. Pt requires specific step-by-step cueing for completion of all tasks. Pt is unable to complete ADL's and mobility without assistance and frequent cueing for sequencing and technique. He continues to complain of difficulty swallowing, and was unable to finish his breakfast because of this. Feel that pt would benefit from further skilled therapy at the CIR level to be able to function at home at a mod I level.   Follow Up Recommendations  CIR;Supervision/Assistance - 24 hour     Equipment Recommendations  Other (comment) (Shower seat; adaptive equipment per OT recommendations)    Recommendations for Other Services Rehab consult     Precautions / Restrictions Precautions Precautions: Fall Restrictions Weight Bearing Restrictions: No    Mobility  Bed Mobility Overal bed mobility: Needs Assistance Bed Mobility: Supine to Sit     Supine to sit: Supervision     General bed mobility comments: Close  supervision for safety. Pt was cued for sequencing and technique. Initially trying to get up without using UE's and was unsuccessful - was cued to use UE's and could transition to EOB.   Transfers Overall transfer level: Needs assistance Equipment used: None Transfers: Sit to/from Stand Sit to Stand: Supervision         General transfer comment: Supervision for safety. Increased time required and kept UE's in a high guard position.   Ambulation/Gait Ambulation/Gait assistance: Min assist Ambulation Distance (Feet): 225 Feet Assistive device: Rolling walker (2 wheeled);1 person hand held assist Gait Pattern/deviations: Step-through pattern;Decreased stride length;Drifts right/left;Trunk flexed Gait velocity: Decreased Gait velocity interpretation: Below normal speed for age/gender General Gait Details: Pt with no noted trunk rotation or arm swing. Pt turned whole body at once instead of only turning his head to look side to side. With specific cueing to do so, pt was able to turn his head to L and R equal distance while maintaining a slow cadence. +2 assist helpful for equipment management as pt unable to push the IV pole while walking or hold a washcloth to his mouth to control excessive drooling (cannot multitask). Used RW for gait training in hall (assist for walker management) and assist was required for ambulation in room without AD.    Stairs            Wheelchair Mobility    Modified Rankin (Stroke Patients Only)       Balance Overall balance assessment: Needs assistance Sitting-balance support: Feet supported;No upper extremity supported Sitting balance-Leahy Scale: Good Sitting balance - Comments: Increased time to achieve full sitting EOB but  was able to hold without assist.      Standing balance-Leahy Scale: Fair                      Cognition Arousal/Alertness: Awake/alert Behavior During Therapy: Flat affect Overall Cognitive Status:  Impaired/Different from baseline Area of Impairment: Attention;Following commands;Safety/judgement;Awareness;Problem solving   Current Attention Level: Selective   Following Commands: Follows one step commands with increased time Safety/Judgement: Decreased awareness of deficits Awareness: Intellectual Problem Solving: Slow processing;Decreased initiation;Difficulty sequencing;Requires verbal cues;Requires tactile cues General Comments: Difficulty terminating task.required tactile cues to continue/terminate task    Exercises      General Comments General comments (skin integrity, edema, etc.): Pt is not able to complete ADL's without frequent cueing and assistance. Was able to place hand in position (with cues) for peri-care, however could not complete task and required assist from therapist. Pt was able to stand at sink and wash hands, however required step-by-step cueing as pt was only rubbing his finger tips together.      Pertinent Vitals/Pain Pain Assessment: Faces Faces Pain Scale: No hurt    Home Living                      Prior Function            PT Goals (current goals can now be found in the care plan section) Acute Rehab PT Goals Patient Stated Goal: Pt did not state goals.  PT Goal Formulation: With patient Time For Goal Achievement: 08/05/14 Potential to Achieve Goals: Good Progress towards PT goals: Progressing toward goals    Frequency  Min 3X/week    PT Plan Current plan remains appropriate    Co-evaluation             End of Session Equipment Utilized During Treatment: Gait belt Activity Tolerance: Patient tolerated treatment well Patient left: in chair;with chair alarm set;with call bell/phone within reach     Time: 0856-0930 PT Time Calculation (min) (ACUTE ONLY): 34 min  Charges:  $Gait Training: 8-22 mins $Therapeutic Activity: 8-22 mins                    G Codes:      Rolinda Roan 01-Aug-2014, 9:45 AM   Rolinda Roan, PT, DPT Acute Rehabilitation Services Pager: [phone]

## 2014-07-24 NOTE — Progress Notes (Signed)
PT working with patient walking with patient in hall with walker minimal 1 assist.

## 2014-07-24 NOTE — Progress Notes (Signed)
LCSW covering for psych CSW has reviewed case and notes from Psych MD. Damaris Schooner with MD on 3/22 regarding plan of care with patient discharging home.  Patient aware of surrounding area. With regards to follow up with medications, patient can follow up with [facility] in the downtown [facility]. He does not require an appointment as they served on a needed basis as a walk in triage unit.  [facility] also has crisis services 24/7 in which he can follow up. At this time not meeting inpatient. His medications can be obtained at [facility], but he must be seen. Contact information was placed on patient's AVS/DC paperwork. LCSW reviewed if he would qualify for an ACT team, however patient does not being only one admission in last 6 months. He does not require a care coordination referral either.  No other needs at this time.  When patient medically stable he can DC home.  Lane Hacker, MSW Clinical Social Work: Emergency Room [phone]

## 2014-07-24 NOTE — Progress Notes (Addendum)
PROGRESS NOTE  Carl Carey [id number] DOB: 1980/10/03 DOA: 07/21/2014 PCP: No primary care provider on file.  Carl Carey is an 34 y.o. male with a very confusing history. 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 The last the family has seen the patient was about 6-7 years ago. They got a call at the end of the week from [facility] asking them to pick him up. They brought him home where he has been unable to eat because he says he can not swallow. His mother reports that he has been drinking tea. He walks slowly. His speech is slurred and he stutters and can not get a lot of his words out intelligibly. He has been drooling. He reports that he has been this way for about 6 weeks but was able to get on a Grey[employer]ound bus from [place] to [place] about 2 weeks ago. He was prescribed Reglan and Protonix at [place]. His mother has been administering twice the prescribed dose. He reports he has been on no medications prior to this. He does not report any exposures. He has been living on the street.  When they last saw the patient 6-7 years ago he was normal.  Records from [place] were reviewed.  Patient had drooling and stuttering there before he was given reglan.  He had a barium swallow with reflux and hiatal hernia.  His drooling was deemed psych and patient was sent home.  Assessment/Plan: Dysphagia with difficulty walking and tremors - cause not clear.  -swallow evaluation- tolerating clears - Neuro: sedimentation rate ok: RPR HIV ok; heavy metal screening pending -physical therapy consult. -d/c Reglan: one dose of Benadryl was given with not much response- was drooling before reglan given -psych eval: last seen normal per family was 7 years ago -requested old record from [place]- recent hospitalization- reviewed as above - will give IV Protonix and start diflucan and nystatin swish and  spit  Hypokalemia -replete  Periapical abscesses - as seen on the CAT scan. I have placed patient on Unasyn. Asked Dr Enrique Sack to see- will need teeth removed  Anemia - check anemia panel. Follow CBC. Iron low- will replace with IV x 1 and  replace once patient able to tolerate PO  Severe Protein calorie malnutrition - nutrition consult -Mg/Phos ok  Code Status: full Family Communication: patient- called mom, no answer Disposition Plan: CIR?   Consultants:  Dental  Psych  PT/OT  Procedures:      HPI/Subjective: Tolerating clears  Objective: Filed Vitals:   07/24/14 0942  BP: 116/60  Pulse: 54  Temp: 98 F (36.7 C)  Resp: 18    Intake/Output Summary (Last 24 hours) at 07/24/14 1004 Last data filed at 07/24/14 0935  Gross per 24 hour  Intake    700 ml  Output    825 ml  Net   -125 ml   Filed Weights   07/21/14 2322 07/22/14 2010 07/23/14 2012  Weight: 60.646 kg (133 lb 11.2 oz) 62.596 kg (138 lb) 59.512 kg (131 lb 3.2 oz)    Exam:   General:  Alert, oriented to person, place, time  Cardiovascular: rrr  Respiratory: clear  Abdomen: +BS, soft  Musculoskeletal: no edema   Data Reviewed: Basic Metabolic Panel:  Recent Labs Lab 07/21/14 1619 07/22/14 0555 07/23/14 1620 07/24/14 0442 07/24/14 0447  NA 139 140 140  --  138  K 3.1* 3.2* 3.9  --  3.6  CL 103 104 103  --  105  CO2 29 27 27   --  29  GLUCOSE 90 98 80  --  73  BUN <5* <5* <5*  --  <5*  CREATININE 0.81 0.94 0.84  --  0.82  CALCIUM 8.8 8.5 8.3*  --  8.1*  MG 1.8  --   --  1.9  --   PHOS  --   --   --  3.7  --    Liver Function Tests:  Recent Labs Lab 07/21/14 1619 07/22/14 0555  AST 19 18  ALT 13 11  ALKPHOS 62 59  BILITOT 0.7 1.1  PROT 6.0 5.7*  ALBUMIN 2.9* 2.7*   No results for input(s): LIPASE, AMYLASE in the last 168 hours. No results for input(s): AMMONIA in the last 168 hours. CBC:  Recent Labs Lab 07/21/14 1619 07/22/14 0555 07/24/14 0447  WBC 6.0  5.0 4.6  NEUTROABS 4.6 3.3  --   HGB 11.7* 11.1* 10.7*  HCT 37.3* 35.4* 34.5*  MCV 74.7* 75.2* 75.2*  PLT 360 361 334   Cardiac Enzymes:  Recent Labs Lab 07/21/14 1619 07/22/14 0013 07/22/14 0555 07/22/14 1115  CKTOTAL 341*  --  218  --   TROPONINI  --  <0.03 <0.03 <0.03   BNP (last 3 results) No results for input(s): BNP in the last 8760 hours.  ProBNP (last 3 results) No results for input(s): PROBNP in the last 8760 hours.  CBG: No results for input(s): GLUCAP in the last 168 hours.  Recent Results (from the past 240 hour(s))  MRSA PCR Screening     Status: None   Collection Time: 07/21/14 11:55 PM  Result Value Ref Range Status   MRSA by PCR NEGATIVE NEGATIVE Final    Comment:        The GeneXpert MRSA Assay (FDA approved for NASAL specimens only), is one component of a comprehensive MRSA colonization surveillance program. It is not intended to diagnose MRSA infection nor to guide or monitor treatment for MRSA infections.      Studies: Dg Orthopantogram  07/22/2014   CLINICAL DATA:  Periapical dental abscess  EXAM: ORTHOPANTOGRAM/PANORAMIC  COMPARISON:  None.  FINDINGS: There is lucency just inferior to the left lower second molar, concerning for early infection. Caries changes noted in each upper third molar as well and is in the left upper second molar.  No fracture or dislocation.  No bony destruction.  No erosion.  IMPRESSION: Lucency just inferior to the lateral aspect of the left lower second molar. Question early infection in this area. Areas of dental caries superiorly involving several molars. No fracture or dislocation. No bony destruction.   Electronically Signed   By: Lowella Grip III M.D.   On: 07/22/2014 12:38    Scheduled Meds: . ampicillin-sulbactam (UNASYN) IV  3 g Intravenous Q6H  . antiseptic oral rinse  7 mL Mouth Rinse BID  . fluconazole (DIFLUCAN) IV  100 mg Intravenous Q24H  . Influenza vac split quadrivalent PF  0.5 mL  Intramuscular Tomorrow-1000  . mirtazapine  15 mg Oral QHS  . nystatin  5 mL Oral QID  . pantoprazole (PROTONIX) IV  40 mg Intravenous Q12H   Continuous Infusions:   Antibiotics Given (last 72 hours)    Date/Time Action Medication Dose Rate   07/21/14 2337 Given   Ampicillin-Sulbactam (UNASYN) 3 g in sodium chloride 0.9 % 100 mL IVPB 3 g 100 mL/hr   07/22/14 0526 Given   Ampicillin-Sulbactam (  UNASYN) 3 g in sodium chloride 0.9 % 100 mL IVPB 3 g 100 mL/hr   07/22/14 1309 Given   Ampicillin-Sulbactam (UNASYN) 3 g in sodium chloride 0.9 % 100 mL IVPB 3 g 100 mL/hr   07/22/14 1801 Given   Ampicillin-Sulbactam (UNASYN) 3 g in sodium chloride 0.9 % 100 mL IVPB 3 g 100 mL/hr   07/23/14 0012 Given   Ampicillin-Sulbactam (UNASYN) 3 g in sodium chloride 0.9 % 100 mL IVPB 3 g 100 mL/hr   07/23/14 0537 Given   Ampicillin-Sulbactam (UNASYN) 3 g in sodium chloride 0.9 % 100 mL IVPB 3 g 100 mL/hr   07/23/14 1833 Given   Ampicillin-Sulbactam (UNASYN) 3 g in sodium chloride 0.9 % 100 mL IVPB 3 g 100 mL/hr   07/24/14 0200 Given   Ampicillin-Sulbactam (UNASYN) 3 g in sodium chloride 0.9 % 100 mL IVPB 3 g 100 mL/hr   07/24/14 8688 Given  [barcode was torn]   Ampicillin-Sulbactam (UNASYN) 3 g in sodium chloride 0.9 % 100 mL IVPB 3 g 100 mL/hr      Principal Problem:   Dysphagia Active Problems:   Parkinsonian features   Periapical abscess   Microcytic anemia   Protein-calorie malnutrition, severe    Time spent: 25 min    Carl Carey  [facility] Pager [phone]. If 7PM-7AM, please contact night-coverage at [email or web address], password [facility] 07/24/2014, 10:04 AM  LOS: 3 days

## 2014-07-24 NOTE — Progress Notes (Signed)
Speech Language Pathology Treatment: Dysphagia  Patient Details Name: Carl Carey MRN: [id number] DOB: 1980/11/04 Today's Date: 07/24/2014 Time: 4982-6415 SLP Time Calculation (min) (ACUTE ONLY): 23 min  Assessment / Plan / Recommendation Clinical Impression  SLP assisted with lunch meal, including advanced PO trials. Pt continues to have oral holding particularly with solid foods, although he did initiate some mastication prior to expectorating food. Anterior loss of thin liquids noted both with his saliva and with iced tea from tray, however he does not swallow any solids without use of a liquid wash. Will continue to follow for readiness to advance.   HPI HPI: 34 year old male admitted 07/21/14 due to difficulty swallowing for the past 6 weeks. PMH significant for panic attacks. MRI, CXR negative. Pt has several dental abscesses, but does not report oral pain.   Pertinent Vitals Pain Assessment: No/denies pain Faces Pain Scale: No hurt  SLP Plan  Continue with current plan of care    Recommendations Diet recommendations: Thin liquid Liquids provided via: Cup;Straw Medication Administration: Via alternative means (versus crushed in puree) Supervision: Full supervision/cueing for compensatory strategies;Staff to assist with self feeding Compensations: Slow rate;Small sips/bites;Check for pocketing Postural Changes and/or Swallow Maneuvers: Seated upright 90 degrees              Oral Care Recommendations: Staff/trained caregiver to provide oral care;Oral care Q4 per protocol Follow up Recommendations: Inpatient Rehab Plan: Continue with current plan of care    Carl Carey, M.A. CCC-SLP [id number]  Carl Carey 07/24/2014, 1:27 PM

## 2014-07-24 NOTE — Progress Notes (Signed)
NUTRITION FOLLOW UP  Pt meets criteria for SEVERE MALNUTRITION in the context of acute illness/injury as evidenced by energy intake intake </= 50% for >/= 5 days, severe fat mass loss, and moderate to severe muscle mass loss.  DOCUMENTATION CODES Per approved criteria  -Severe malnutrition in the context of acute illness or injury   INTERVENTION: Provide Ensure Enlive po TID, each supplement provides 350 kcal and 20 grams of protein.  Continue to monitor magnesium, potassium, and phosphorus daily for at least 2 more days, MD to replete as needed, as pt is at risk for refeeding syndrome given severe malnutrition and little to no PO intake in 6 weeks.  If pt continues to have poor po intake due to difficulty swallowing, recommend initiating enteral nutrition.  RD to continue to monitor.   NUTRITION DIAGNOSIS: Inadequate oral intake related to inability to eat as evidenced by NPO status; advanced to full liquid; onging  Goal: Pt to meet >/= 90% of their estimated nutrition needs; not met   Monitor:  PO intake, weight trends, labs, I/O's  34 y.o. male  Admitting Dx: Dysphagia  ASSESSMENT: Pt with no significant past medical history has been experiencing difficulty swallowing over the last 6 weeks. Pt unable to eat because he says he can not swallow. His speech is slurred and he stutters and can not get a lot of his words out intelligibly.  Pt has been advanced to a full liquid diet. Meal completion however has been 0-25%. Spoke with RN, she reports pt is able to swallow, however consumption and movements towards feeding has been very slow. Pt is agreeable to Ensure to aid in caloric and protein needs. RD to order. If po intake continues to be poor, recommend consideration of enteral nutrition.   Labs: Low BUN and calcium. Phosphorous and magnesium WNL.  Height: Ht Readings from Last 1 Encounters:  07/21/14 5' 8"  (1.727 m)    Weight: Wt Readings from Last 1 Encounters:   07/23/14 131 lb 3.2 oz (59.512 kg)    BMI:  Body mass index is 19.95 kg/(m^2).  Re-Estimated Nutritional Needs: Kcal: 2000-2200 Protein: 80-100 grams Fluid: 2- 2.2 L/day  Skin: Intact  Diet Order: Diet full liquid   Intake/Output Summary (Last 24 hours) at 07/24/14 1217 Last data filed at 07/24/14 0935  Gross per 24 hour  Intake    700 ml  Output    625 ml  Net     75 ml    Last BM: PTA  Labs:   Recent Labs Lab 07/21/14 1619 07/22/14 0555 07/23/14 1620 07/24/14 0442 07/24/14 0447  NA 139 140 140  --  138  K 3.1* 3.2* 3.9  --  3.6  CL 103 104 103  --  105  CO2 29 27 27   --  29  BUN <5* <5* <5*  --  <5*  CREATININE 0.81 0.94 0.84  --  0.82  CALCIUM 8.8 8.5 8.3*  --  8.1*  MG 1.8  --   --  1.9  --   PHOS  --   --   --  3.7  --   GLUCOSE 90 98 80  --  73    CBG (last 3)  No results for input(s): GLUCAP in the last 72 hours.  Scheduled Meds: . ampicillin-sulbactam (UNASYN) IV  3 g Intravenous Q6H  . antiseptic oral rinse  7 mL Mouth Rinse BID  . fluconazole (DIFLUCAN) IV  100 mg Intravenous Q24H  . Influenza vac split  quadrivalent PF  0.5 mL Intramuscular Tomorrow-1000  . mirtazapine  15 mg Oral QHS  . nystatin  5 mL Oral QID  . pantoprazole (PROTONIX) IV  40 mg Intravenous Q12H    Continuous Infusions:    Past Medical History  Diagnosis Date  . Panic attack     Past Surgical History  Procedure Laterality Date  . Neck surgery      Kallie Locks, MS, RD, LDN Pager # [phone] After hours/ weekend pager # [phone]

## 2014-07-25 ENCOUNTER — Inpatient Hospital Stay (HOSPITAL_COMMUNITY): Payer: Medicaid Other

## 2014-07-25 DIAGNOSIS — R509 Fever, unspecified: Secondary | ICD-10-CM

## 2014-07-25 LAB — URINALYSIS, ROUTINE W REFLEX MICROSCOPIC
Bilirubin Urine: NEGATIVE
Glucose, UA: NEGATIVE mg/dL
Hgb urine dipstick: NEGATIVE
Ketones, ur: NEGATIVE mg/dL
Leukocytes, UA: NEGATIVE
Nitrite: NEGATIVE
Protein, ur: NEGATIVE mg/dL
Specific Gravity, Urine: 1.025 (ref 1.005–1.030)
Urobilinogen, UA: 0.2 mg/dL (ref 0.0–1.0)
pH: 7 (ref 5.0–8.0)

## 2014-07-25 LAB — HEAVY METALS SCREEN, URINE
Arsenic, 24H Ur: 5 mcg/L (ref <81)
Lead, 24 hr urine: <1 mcg/L (ref <80)
Mercury, 24H Ur: <2 mcg/L (ref <21)

## 2014-07-25 LAB — CLOSTRIDIUM DIFFICILE BY PCR: Toxigenic C. Difficile by PCR: NEGATIVE

## 2014-07-25 LAB — GLUCOSE, CAPILLARY: Glucose-Capillary: 77 mg/dL (ref 70–99)

## 2014-07-25 LAB — INFLUENZA PANEL BY PCR (TYPE A & B)
H1N1 flu by pcr: NOT DETECTED
Influenza A By PCR: NEGATIVE
Influenza B By PCR: NEGATIVE

## 2014-07-25 MED ORDER — PANTOPRAZOLE SODIUM 40 MG IV SOLR
40.0000 mg | INTRAVENOUS | Status: DC
  Administered 2014-07-26: 40 mg via INTRAVENOUS
  Filled 2014-07-25: qty 40

## 2014-07-25 NOTE — Progress Notes (Signed)
PROGRESS NOTE  Carl Carey VWU:[id number] DOB: Jan 22, 1981 DOA: 07/21/2014 PCP: No primary care provider on file.  Carl Carey is an 34 y.o. male with a very confusing history. 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 The last the family has seen the patient was about 6-7 years ago. They got a call at the end of the week from [facility] asking them to pick him up. They brought him home where he has been unable to eat because he says he can not swallow. His mother reports that he has been drinking tea. He walks slowly. His speech is slurred and he stutters and can not get a lot of his words out intelligibly. He has been drooling. He reports that he has been this way for about 6 weeks but was able to get on a Grey[employer]ound bus from [place] to [place] about 2 weeks ago. He was prescribed Reglan and Protonix at [place]. His mother has been administering twice the prescribed dose. He reports he has been on no medications prior to this. He does not report any exposures. He has been living on the [address].  When they last saw the patient 6-7 years ago he was normal.  Records from [place] were reviewed.  Patient had drooling and stuttering there before he was given reglan.  He had a barium swallow with reflux and hiatal hernia.  His drooling was deemed psych and patient was sent home.  Assessment/Plan: Fever: repeat CXR negative. B.c. Pending. c diff neg. Will repeat UA, check flu panel  Loose stool: c diff neg. Likely abx related. D/w Dr. Enrique Sack. Ok to stop unasyn, as WBC normal  Dysphagia cause unclear. Due to poor dentition? Psych related?  Discussed with ST. Advance to puree, thins - Neuro: sedimentation rate ok: RPR HIV ok; heavy metal screening pending -d/c Reglan: one dose of Benadryl was given with not much response- was drooling before reglan given -psych eval: last seen normal per family was 7 years ago -requested  old record from [place]- recent hospitalization- reviewed as above Continue antifungal and ppi for now.  Dr. Enrique Sack to extract multiple teeth Monday.  Hypokalemia -replete  Periapical abscesses see above  Anemia - check anemia panel. Follow CBC. Iron low- will replace with IV x 1 and  replace once patient able to tolerate PO  Severe Protein calorie malnutrition - nutrition consult -Mg/Phos ok  Code Status: full Family Communication:  Disposition Plan: home when stable   Consultants:  Dental  Psych  PT/OT  Procedures:      HPI/Subjective: C/o continued difficulty swallowing. Per S.T., poor effort with solids.  Objective: Filed Vitals:   07/25/14 1031  BP:   Pulse:   Temp: 99.5 F (37.5 C)  Resp:     Intake/Output Summary (Last 24 hours) at 07/25/14 1414 Last data filed at 07/25/14 1334  Gross per 24 hour  Intake   2150 ml  Output    500 ml  Net   1650 ml   Filed Weights   07/22/14 2010 07/23/14 2012 07/24/14 2100  Weight: 62.596 kg (138 lb) 59.512 kg (131 lb 3.2 oz) 59.8 kg (131 lb 13.4 oz)    Exam:   General:  Alert, oriented to person, place, time. Stuttering speech. Bradykinesia  HEENT: wont open mouth very wide. MMM  Cardiovascular: rrr  Respiratory: clear  Abdomen: +BS, soft  Musculoskeletal: no edema   Data Reviewed: Basic Metabolic Panel:  Recent Labs Lab 07/21/14 1619 07/22/14 0555 07/23/14 1620 07/24/14 0442 07/24/14 0447  NA 139 140 140  --  138  K 3.1* 3.2* 3.9  --  3.6  CL 103 104 103  --  105  CO2 29 27 27   --  29  GLUCOSE 90 98 80  --  73  BUN <5* <5* <5*  --  <5*  CREATININE 0.81 0.94 0.84  --  0.82  CALCIUM 8.8 8.5 8.3*  --  8.1*  MG 1.8  --   --  1.9  --   PHOS  --   --   --  3.7  --    Liver Function Tests:  Recent Labs Lab 07/21/14 1619 07/22/14 0555  AST 19 18  ALT 13 11  ALKPHOS 62 59  BILITOT 0.7 1.1  PROT 6.0 5.7*  ALBUMIN 2.9* 2.7*   No results for input(s): LIPASE, AMYLASE in the last  168 hours. No results for input(s): AMMONIA in the last 168 hours. CBC:  Recent Labs Lab 07/21/14 1619 07/22/14 0555 07/24/14 0447  WBC 6.0 5.0 4.6  NEUTROABS 4.6 3.3  --   HGB 11.7* 11.1* 10.7*  HCT 37.3* 35.4* 34.5*  MCV 74.7* 75.2* 75.2*  PLT 360 361 334   Cardiac Enzymes:  Recent Labs Lab 07/21/14 1619 07/22/14 0013 07/22/14 0555 07/22/14 1115  CKTOTAL 341*  --  218  --   TROPONINI  --  <0.03 <0.03 <0.03   BNP (last 3 results) No results for input(s): BNP in the last 8760 hours.  ProBNP (last 3 results) No results for input(s): PROBNP in the last 8760 hours.  CBG:  Recent Labs Lab 07/25/14 0729  GLUCAP 77    Recent Results (from the past 240 hour(s))  MRSA PCR Screening     Status: None   Collection Time: 07/21/14 11:55 PM  Result Value Ref Range Status   MRSA by PCR NEGATIVE NEGATIVE Final    Comment:        The GeneXpert MRSA Assay (FDA approved for NASAL specimens only), is one component of a comprehensive MRSA colonization surveillance program. It is not intended to diagnose MRSA infection nor to guide or monitor treatment for MRSA infections.   Clostridium Difficile by PCR     Status: None   Collection Time: 07/25/14  5:04 AM  Result Value Ref Range Status   C difficile by pcr NEGATIVE NEGATIVE Final     Studies: Dg Chest Port 1 View  07/25/2014   CLINICAL DATA:  Fever and dysphagia  EXAM: PORTABLE CHEST - 1 VIEW  COMPARISON:  Portable chest x-ray of July 21, 2014  FINDINGS: The lungs are adequately inflated and clear. The heart and pulmonary vascularity are normal. The mediastinum is normal in width. The trachea is midline. There is no pleural effusion or pneumothorax. The bony thorax is unremarkable.  IMPRESSION: There is no active cardiopulmonary disease.   Electronically Signed   By: David  Martinique   On: 07/25/2014 11:35    Scheduled Meds: . antiseptic oral rinse  7 mL Mouth Rinse BID  . feeding supplement (ENSURE ENLIVE)  237 mL Oral  TID BM  . fluconazole (DIFLUCAN) IV  100 mg Intravenous Q24H  . Influenza vac split quadrivalent PF  0.5 mL Intramuscular Tomorrow-1000  . mirtazapine  30 mg Oral QHS  . nystatin  5 mL Oral QID  . [START ON 07/26/2014] pantoprazole (PROTONIX) IV  40 mg Intravenous Q24H   Continuous Infusions:  Antibiotics Given (last 72 hours)    Date/Time Action Medication Dose Rate   07/22/14 1801 Given   Ampicillin-Sulbactam (UNASYN) 3 g in sodium chloride 0.9 % 100 mL IVPB 3 g 100 mL/hr   07/23/14 0012 Given   Ampicillin-Sulbactam (UNASYN) 3 g in sodium chloride 0.9 % 100 mL IVPB 3 g 100 mL/hr   07/23/14 0537 Given   Ampicillin-Sulbactam (UNASYN) 3 g in sodium chloride 0.9 % 100 mL IVPB 3 g 100 mL/hr   07/23/14 1833 Given   Ampicillin-Sulbactam (UNASYN) 3 g in sodium chloride 0.9 % 100 mL IVPB 3 g 100 mL/hr   07/24/14 0200 Given   Ampicillin-Sulbactam (UNASYN) 3 g in sodium chloride 0.9 % 100 mL IVPB 3 g 100 mL/hr   07/24/14 3212 Given  [barcode was torn]   Ampicillin-Sulbactam (UNASYN) 3 g in sodium chloride 0.9 % 100 mL IVPB 3 g 100 mL/hr   07/24/14 1141 Given   Ampicillin-Sulbactam (UNASYN) 3 g in sodium chloride 0.9 % 100 mL IVPB 3 g 100 mL/hr   07/24/14 1800 Given   Ampicillin-Sulbactam (UNASYN) 3 g in sodium chloride 0.9 % 100 mL IVPB 3 g 100 mL/hr   07/25/14 0025 Given   Ampicillin-Sulbactam (UNASYN) 3 g in sodium chloride 0.9 % 100 mL IVPB 3 g 100 mL/hr   07/25/14 0509 Given   Ampicillin-Sulbactam (UNASYN) 3 g in sodium chloride 0.9 % 100 mL IVPB 3 g 100 mL/hr   07/25/14 1208 Given   Ampicillin-Sulbactam (UNASYN) 3 g in sodium chloride 0.9 % 100 mL IVPB 3 g 100 mL/hr      Time spent: 25 min    Kay Ricciuti L  [facility] [email or web address], password [facility] 07/25/2014, 2:14 PM  LOS: 4 days

## 2014-07-25 NOTE — Clinical Social Work Note (Signed)
On 07/24/14, CSW and nurse case manager Carl Carey talked with patient and his mother Carl Carey at the bedside. Per mother, she had not seen her son in 69 years. Ms. Guinta could supply a history on patient for the time he was away from her and she reported that someone informed her that patient was in the hospital. Ms. Dority reported that she lives in [place] and her son will d/c home with her. She intends to apply for Medicaid on his behalf. During conversation, patient maintained a flat affect and fixed gaze. He did not talk much, however when he spoke he mumbled and stuttered and it was very difficult to understand him. The nurse case manager advised mother of the services available to patient once he discharges to her home.  CSW signing off, please reconsult if any other SW services needed.   Carl Carey, MSW, LCSW Licensed Clinical Social Worker [place] [phone]

## 2014-07-25 NOTE — Consult Note (Signed)
Psychiatry Consult follow up  Reason for Consult:  Depression, anxiety, bizarre behaviors Referring Physician:  Dr. Eliseo Squires Patient Identification: Carl Carey MRN:  [id number] Principal Diagnosis: Dysphagia Diagnosis:   Patient Active Problem List   Diagnosis Date Noted  . Fever in adult [R50.9] 07/25/2014  . Protein-calorie malnutrition, severe [E43] 07/23/2014  . Parkinsonian features [R25.9] 07/21/2014  . Dysphagia [R13.10] 07/21/2014  . Periapical abscess [K04.7] 07/21/2014  . Microcytic anemia [D50.9] 07/21/2014    Total Time spent with patient: 20 minutes  Subjective:   Carl Carey is a 34 y.o. male patient admitted with generalized weakness, dysphagia and depression, anxiety, suicidal thoughts and behaviors.  HPI:  Carl Carey is an 34 y.o. male seen face-to-face for psychiatric evaluation for depression, anxiety, passive suicidal ideation and old behaviors. Reportedly patient has no previous acute psychiatric hospitalization or treatment. Patient was seen with his mother and a cousin in his room. Patient reported feeling depressed, sad, anxious, worried, weak, not able to walk and not able to swallow solid food. Reportedly he has been drinking fluids over 6-7 weeks. Patient mother and cousin reported that patient has been drifting away from home for the last 7 years and going to different cities like [place], [place] and [place] and living as a homeless. Reportedly he was sometimes stayed in homeless shelter, taking shower once a week and eat food wherever he can get. Patient also reported panhandling money to move around different cities. 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 Patient reported feeling anxiety about not able to swallow food. Patient stated having the suicidal thoughts sometimes but no intention or plans. Patient has no evidence of auditory/visual  hallucinations, delusions or paranoia during this evaluation. Patient denied history of drinking alcohol and abusing illicit drugs.  July 25, 2014  Interval History: Patient seen and continue to complain about not able to chew and swallow. He has been talking with less stuttering today and seen speaking on phone some what better telling that he is been treated with IV medication. Patient states that he has slept and drinking fluids with some difficulty. Patient has generalized weakness and able to walk with physical therapy with walker. He was neglected himself too long before arrived to his family. He has no family at bed side today. Patient has normal limits of heavy metals and he is not meeting criteria for rehabilitation.   Medical history: Patient with a very confusing history. 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 The last the family has seen the patient was about 6-7 years ago. They got a call at the end of the week from [facility] asking them to pick him up. They brought him home where he has been unable to eat because he says he can not swallow. His mother reports that he has been drinking tea. He walks slowly. His speech is slurred and he stutters and can not get a lot of his words out intelligibly. He has been drooling. He reports that he has been this way for about 6 weeks but was able to get on a Greyhound bus from [place] to [place] about 2 weeks ago. He was prescribed Reglan and Protonix at [place]. His mother has been administering twice the prescribed dose. He reports he has been on no medications prior  to this. He does not report any exposures. He has been living on the street. When they last saw the patient 6-7 years ago he was normal.   Past Medical History:  Past Medical History  Diagnosis Date  . Panic attack     Past Surgical History  Procedure Laterality Date  . Neck surgery     Family  History:  Family History  Problem Relation Age of Onset  . Hypertension Maternal Grandmother    Social History:  History  Alcohol Use No     History  Drug Use No    History   Social History  . Marital Status: Single    Spouse Name: N/A  . Number of Children: N/A  . Years of Education: N/A   Social History Main Topics  . Smoking status: Never Smoker   . Smokeless tobacco: Not on file  . Alcohol Use: No  . Drug Use: No  . Sexual Activity: Not on file   Other Topics Concern  . None   Social History Narrative  . None   Additional Social History:                          Allergies:  No Known Allergies  Labs:  Results for orders placed or performed during the hospital encounter of 07/21/14 (from the past 48 hour(s))  Basic metabolic panel     Status: Abnormal   Collection Time: 07/23/14  4:20 PM  Result Value Ref Range   Sodium 140 135 - 145 mmol/L   Potassium 3.9 3.5 - 5.1 mmol/L    Comment: DELTA CHECK NOTED   Chloride 103 96 - 112 mmol/L   CO2 27 19 - 32 mmol/L   Glucose, Bld 80 70 - 99 mg/dL   BUN <5 (L) 6 - 23 mg/dL   Creatinine, Ser 0.84 0.50 - 1.35 mg/dL   Calcium 8.3 (L) 8.4 - 10.5 mg/dL   GFR calc non Af Amer >90 >90 mL/min   GFR calc Af Amer >90 >90 mL/min    Comment: (NOTE) The eGFR has been calculated using the CKD EPI equation. This calculation has not been validated in all clinical situations. eGFR's persistently <90 mL/min signify possible Chronic Kidney Disease.    Anion gap 10 5 - 15  Magnesium     Status: None   Collection Time: 07/24/14  4:42 AM  Result Value Ref Range   Magnesium 1.9 1.5 - 2.5 mg/dL  Phosphorus     Status: None   Collection Time: 07/24/14  4:42 AM  Result Value Ref Range   Phosphorus 3.7 2.3 - 4.6 mg/dL  CBC     Status: Abnormal   Collection Time: 07/24/14  4:47 AM  Result Value Ref Range   WBC 4.6 4.0 - 10.5 K/uL   RBC 4.59 4.22 - 5.81 MIL/uL   Hemoglobin 10.7 (L) 13.0 - 17.0 g/dL   HCT 34.5 (L) 39.0  - 52.0 %   MCV 75.2 (L) 78.0 - 100.0 fL   MCH 23.3 (L) 26.0 - 34.0 pg   MCHC 31.0 30.0 - 36.0 g/dL   RDW 17.2 (H) 11.5 - 15.5 %   Platelets 334 150 - 400 K/uL  Basic metabolic panel     Status: Abnormal   Collection Time: 07/24/14  4:47 AM  Result Value Ref Range   Sodium 138 135 - 145 mmol/L   Potassium 3.6 3.5 - 5.1 mmol/L   Chloride 105 96 -  112 mmol/L   CO2 29 19 - 32 mmol/L   Glucose, Bld 73 70 - 99 mg/dL   BUN <5 (L) 6 - 23 mg/dL   Creatinine, Ser 0.82 0.50 - 1.35 mg/dL   Calcium 8.1 (L) 8.4 - 10.5 mg/dL   GFR calc non Af Amer >90 >90 mL/min   GFR calc Af Amer >90 >90 mL/min    Comment: (NOTE) The eGFR has been calculated using the CKD EPI equation. This calculation has not been validated in all clinical situations. eGFR's persistently <90 mL/min signify possible Chronic Kidney Disease.    Anion gap 4 (L) 5 - 15  Clostridium Difficile by PCR     Status: None   Collection Time: 07/25/14  5:04 AM  Result Value Ref Range   C difficile by pcr NEGATIVE NEGATIVE  Glucose, capillary     Status: None   Collection Time: 07/25/14  7:29 AM  Result Value Ref Range   Glucose-Capillary 77 70 - 99 mg/dL    Vitals: Blood pressure 125/70, pulse 88, temperature 99.5 F (37.5 C), temperature source Oral, resp. rate 20, height 5' 8"  (1.727 m), weight 59.8 kg (131 lb 13.4 oz), SpO2 99 %.  Risk to Self: Is patient at risk for suicide?: No Risk to Others:   Prior Inpatient Therapy:   Prior Outpatient Therapy:    Current Facility-Administered Medications  Medication Dose Route Frequency Provider Last Rate Last Dose  . acetaminophen (TYLENOL) tablet 650 mg  650 mg Oral Q6H PRN Rise Patience, MD   650 mg at 07/25/14 2694   Or  . acetaminophen (TYLENOL) suppository 650 mg  650 mg Rectal Q6H PRN Rise Patience, MD      . antiseptic oral rinse (CPC / CETYLPYRIDINIUM CHLORIDE 0.05%) solution 7 mL  7 mL Mouth Rinse BID Rise Patience, MD   7 mL at 07/25/14 1014  . feeding  supplement (ENSURE ENLIVE) (ENSURE ENLIVE) liquid 237 mL  237 mL Oral TID BM Kallie Locks, RD   237 mL at 07/25/14 1436  . fluconazole (DIFLUCAN) IVPB 100 mg  100 mg Intravenous Q24H Geradine Girt, DO   100 mg at 07/25/14 1026  . Influenza vac split quadrivalent PF (FLUARIX) injection 0.5 mL  0.5 mL Intramuscular Tomorrow-1000 Jessica U Vann, DO      . mirtazapine (REMERON SOL-TAB) disintegrating tablet 30 mg  30 mg Oral QHS Ambrose Finland, MD   30 mg at 07/24/14 2220  . nystatin (MYCOSTATIN) 100000 UNIT/ML suspension 500,000 Units  5 mL Oral QID Geradine Girt, DO   500,000 Units at 07/25/14 1443  . ondansetron (ZOFRAN) tablet 4 mg  4 mg Oral Q6H PRN Rise Patience, MD       Or  . ondansetron [facility]) injection 4 mg  4 mg Intravenous Q6H PRN Rise Patience, MD      . Derrill Memo ON 07/26/2014] pantoprazole (PROTONIX) injection 40 mg  40 mg Intravenous Q24H Delfina Redwood, MD      . polyvinyl alcohol (LIQUIFILM TEARS) 1.4 % ophthalmic solution 1 drop  1 drop Both Eyes PRN Geradine Girt, DO        Musculoskeletal: Strength & Muscle Tone: decreased Gait & Station: unable to stand Patient leans: N/A  Psychiatric Specialty Exam: Physical Exam  ROS  Blood pressure 125/70, pulse 88, temperature 99.5 F (37.5 C), temperature source Oral, resp. rate 20, height 5' 8"  (1.727 m), weight 59.8 kg (131 lb 13.4 oz), SpO2 99 %.Body  mass index is 20.05 kg/(m^2).  General Appearance: Guarded  Eye Contact::  Fair  Speech:  Slurred and stuttering  Volume:  Decreased  Mood:  Anxious and Depressed  Affect:  Constricted and Depressed  Thought Process:  Coherent and Goal Directed  Orientation:  Full (Time, Place, and Person)  Thought Content:  WDL  Suicidal Thoughts:  No  Homicidal Thoughts:  No  Memory:  Immediate;   Fair Recent;   Fair  Judgement:  Impaired  Insight:  Shallow  Psychomotor Activity:  Decreased  Concentration:  Fair  Recall:  Good  Fund of Knowledge:Good   Language: Good  Akathisia:  NA  Handed:  Right  AIMS (if indicated):     Assets:  Communication Skills Desire for Improvement Housing Intimacy Leisure Time Resilience Social Support  ADL's:  Impaired  Cognition: Impaired,  Mild  Sleep:      Medical Decision Making: New problem, with additional work up planned, Review of Psycho-Social Stressors (1), Review or order clinical lab tests (1), Review or order medicine tests (1), Review of Medication Regimen & Side Effects (2) and Review of New Medication or Change in Dosage (2)  Treatment Plan Summary: Daily contact with patient to assess and evaluate symptoms and progress in treatment and Medication management  Plan: Case discussed with Dr. Eliseo Squires  continue Remeron soluble tablet 30 mg at bedtime for depression, insomnia and poor appetite Patient does not meet criteria for psychiatric inpatient admission. Supportive therapy provided about ongoing stressors. Appreciate psychiatric consultation and follow up as clinically required Please contact [phone] or [phone] if needs further assistance  Disposition: Patient may be discharged to home with the outpatient psychiatric services and medically stable.  Marytza Grandpre,JANARDHAHA R. 07/25/2014 3:40 PM

## 2014-07-25 NOTE — Progress Notes (Signed)
Attempted to ambulate patient in the hallway but patient refused, " I don't feel like it". Will attempt again later. Maisie Hauser, Wonda Cheng, Therapist, sports

## 2014-07-25 NOTE — Progress Notes (Signed)
Speech Language Pathology Treatment: Dysphagia  Patient Details Name: Carl Carey MRN: [id number] DOB: [birth year] Today's Date: 07/25/2014 Time: 2567-2091 SLP Time Calculation (min) (ACUTE ONLY): 29 min  Assessment / Plan / Recommendation Clinical Impression  Skilled treatment session focused on addressing dysphagia goals.  SLP facilitated session with increased time and allowing patient to direct pace and consistency trials with Max encouragement.  SLP also facilitated session with Mod verbal cues to initiate swallows throughout session.  Patient consumed thin liquids via straws with adequate oral ability to achieve seal and suction of bolus through straw as well as what appeared to be adequate oral control as evidenced by no overt s/s of penetration/aspiration of thin liquids with suspected delayed swallow.  Of note, at times patient demonstrated timely swallow initiation.  Cues as mentioned above, with Dys.1 texture trials of Magic Cup resulted in mastication attempts with minimal anterior loss of bolus and no overt s/s of aspiration.  Trials of Dys.2 textures resulted in no mastication attempts, despite cuing and oral expectoration of bolus.  Patient reported not being able to do it so trials were ended.  Recommend diet advancement to Dys.1 textures and thin liquids with SLP follow-up to continue efforts with Dys.2 textures.       HPI HPI: 34 year old male admitted 07/21/14 due to difficulty swallowing for the past 6 weeks. PMH significant for panic attacks. MRI, CXR negative. Pt has several dental abscesses, but does not report oral pain.   Pertinent Vitals Pain Assessment: No/denies pain  SLP Plan  Continue with current plan of care    Recommendations Diet recommendations: Dysphagia 1 (puree);Thin liquid Liquids provided via: Cup;Straw Medication Administration: Via alternative means (versus crushed in puree) Supervision: Full supervision/cueing for compensatory strategies;Staff to assist  with self feeding Compensations: Slow rate;Small sips/bites;Check for pocketing;Check for anterior loss Postural Changes and/or Swallow Maneuvers: Seated upright 90 degrees              Oral Care Recommendations: Oral care Q4 per protocol Follow up Recommendations: 24 hour supervision/assistance;Skilled Nursing facility;Home health SLP Plan: Continue with current plan of care    GO    Carmelia Roller., CCC-SLP [phone]  [place] 07/25/2014, 2:48 PM

## 2014-07-25 NOTE — Progress Notes (Signed)
Patient with temp of 101.9,tylenol p.o given. Dr. Conley Canal text paged and made aware.Orders received.Will continue to monitor. Kaisen Ackers, Wonda Cheng, Therapist, sports

## 2014-07-26 MED ORDER — PANTOPRAZOLE SODIUM 40 MG PO TBEC
40.0000 mg | DELAYED_RELEASE_TABLET | Freq: Every day | ORAL | Status: DC
  Administered 2014-07-26 – 2014-07-28 (×3): 40 mg via ORAL
  Filled 2014-07-26 (×3): qty 1

## 2014-07-26 MED ORDER — PANTOPRAZOLE SODIUM 40 MG PO PACK: 40.0000 mg | PACK | Freq: Every day | ORAL | Status: DC

## 2014-07-26 MED FILL — Nutritional Supplement Liquid: ORAL | Qty: 237 | Status: AC

## 2014-07-26 NOTE — Progress Notes (Signed)
PROGRESS NOTE  Carl Carey KNL:[id number] DOB: 12-07-1980 DOA: 07/21/2014 PCP: No primary care provider on file.  Carl Carey is an 34 y.o. male with a very confusing history. 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 The last the family has seen the patient was about 6-7 years ago. They got a call at the end of the week from [facility] asking them to pick him up. They brought him home where he has been unable to eat because he says he can not swallow. His mother reports that he has been drinking tea. He walks slowly. His speech is slurred and he stutters and can not get a lot of his words out intelligibly. He has been drooling. He reports that he has been this way for about 6 weeks but was able to get on a Greyhound bus from [place] to [place] about 2 weeks ago. He was prescribed Reglan and Protonix at [place]. His mother has been administering twice the prescribed dose. He reports he has been on no medications prior to this. He does not report any exposures. He has been living on the street.  When they last saw the patient 6-7 years ago he was normal.  Records from [place] were reviewed.  Patient had drooling and stuttering there before he was given reglan.  He had a barium swallow with reflux and hiatal hernia.  His drooling was deemed psych and patient was sent home.  Assessment/Plan: Fever: workup negative. Monitor off antibiotics  Loose stool: c diff neg. Likely abx related. D/w Dr. Enrique Sack. Ok to stop unasyn, as WBC normal  Dysphagia cause unclear. Due to poor dentition? Psych related?  Diet advanced to D2 - Neuro: sedimentation rate ok: RPR HIV ok; heavy metal screening negative -d/c Reglan: one dose of Benadryl was given with not much response- was drooling before reglan given Continue antifungal and ppi for now.  Dr. Enrique Sack to extract multiple teeth Monday.  Hypokalemia -replete  Periapical  abscesses see above  Anemia - check anemia panel. Follow CBC. Iron low- will replace with IV x 1 and  replace once patient able to tolerate PO  Severe Protein calorie malnutrition - nutrition consult -Mg/Phos ok  Code Status: full Family Communication:  Disposition Plan: home when stable   Consultants:  Dental  Psych  PT/OT  Procedures:      HPI/Subjective: A little better  Objective: Filed Vitals:   07/26/14 1031  BP: 99/49  Pulse:   Temp:   Resp:     Intake/Output Summary (Last 24 hours) at 07/26/14 1617 Last data filed at 07/26/14 1402  Gross per 24 hour  Intake   1440 ml  Output    150 ml  Net   1290 ml   Filed Weights   07/23/14 2012 07/24/14 2100 07/25/14 2058  Weight: 59.512 kg (131 lb 3.2 oz) 59.8 kg (131 lb 13.4 oz) 61.961 kg (136 lb 9.6 oz)    Exam:   General:  Alert, oriented to person, place, time. Stuttering speech. Bradykinesia  HEENT: wont open mouth very wide. MMM  Cardiovascular: rrr  Respiratory: clear  Abdomen: +BS, soft  Musculoskeletal: no edema   Data Reviewed: Basic Metabolic Panel:  Recent Labs Lab 07/21/14 1619 07/22/14 0555 07/23/14 1620 07/24/14 0442 07/24/14 0447  NA 139 140 140  --  138  K 3.1* 3.2* 3.9  --  3.6  CL 103 104 103  --  105  CO2 29 27 27   --  29  GLUCOSE 90 98 80  --  73  BUN <5* <5* <5*  --  <5*  CREATININE 0.81 0.94 0.84  --  0.82  CALCIUM 8.8 8.5 8.3*  --  8.1*  MG 1.8  --   --  1.9  --   PHOS  --   --   --  3.7  --    Liver Function Tests:  Recent Labs Lab 07/21/14 1619 07/22/14 0555  AST 19 18  ALT 13 11  ALKPHOS 62 59  BILITOT 0.7 1.1  PROT 6.0 5.7*  ALBUMIN 2.9* 2.7*   No results for input(s): LIPASE, AMYLASE in the last 168 hours. No results for input(s): AMMONIA in the last 168 hours. CBC:  Recent Labs Lab 07/21/14 1619 07/22/14 0555 07/24/14 0447  WBC 6.0 5.0 4.6  NEUTROABS 4.6 3.3  --   HGB 11.7* 11.1* 10.7*  HCT 37.3* 35.4* 34.5*  MCV 74.7* 75.2* 75.2*   PLT 360 361 334   Cardiac Enzymes:  Recent Labs Lab 07/21/14 1619 07/22/14 0013 07/22/14 0555 07/22/14 1115  CKTOTAL 341*  --  218  --   TROPONINI  --  <0.03 <0.03 <0.03   BNP (last 3 results) No results for input(s): BNP in the last 8760 hours.  ProBNP (last 3 results) No results for input(s): PROBNP in the last 8760 hours.  CBG:  Recent Labs Lab 07/25/14 0729  GLUCAP 77    Recent Results (from the past 240 hour(s))  MRSA PCR Screening     Status: None   Collection Time: 07/21/14 11:55 PM  Result Value Ref Range Status   MRSA by PCR NEGATIVE NEGATIVE Final    Comment:        The GeneXpert MRSA Assay (FDA approved for NASAL specimens only), is one component of a comprehensive MRSA colonization surveillance program. It is not intended to diagnose MRSA infection nor to guide or monitor treatment for MRSA infections.   Clostridium Difficile by PCR     Status: None   Collection Time: 07/25/14  5:04 AM  Result Value Ref Range Status   C difficile by pcr NEGATIVE NEGATIVE Final  Culture, blood (routine x 2)     Status: None (Preliminary result)   Collection Time: 07/25/14  9:10 AM  Result Value Ref Range Status   Specimen Description BLOOD LEFT HAND  Final   Special Requests BOTTLES DRAWN AEROBIC ONLY 10 CC  Final   Culture   Final           BLOOD CULTURE RECEIVED NO GROWTH TO DATE CULTURE WILL BE HELD FOR 5 DAYS BEFORE ISSUING A FINAL NEGATIVE REPORT Performed at [facility]    Report Status PENDING  Incomplete  Culture, blood (routine x 2)     Status: None (Preliminary result)   Collection Time: 07/25/14  9:15 AM  Result Value Ref Range Status   Specimen Description BLOOD RIGHT HAND  Final   Special Requests BOTTLES DRAWN AEROBIC ONLY 10 CC  Final   Culture   Final           BLOOD CULTURE RECEIVED NO GROWTH TO DATE CULTURE WILL BE HELD FOR 5 DAYS BEFORE ISSUING A FINAL NEGATIVE REPORT Performed at [facility]    Report Status PENDING   Incomplete     Studies: Dg Chest Port 1 View  07/25/2014   CLINICAL DATA:  Fever and dysphagia  EXAM: PORTABLE CHEST - 1 VIEW  COMPARISON:  Portable chest x-ray of July 21, 2014  FINDINGS: The lungs are adequately inflated and clear. The heart and pulmonary vascularity are normal. The mediastinum is normal in width. The trachea is midline. There is no pleural effusion or pneumothorax. The bony thorax is unremarkable.  IMPRESSION: There is no active cardiopulmonary disease.   Electronically Signed   By: David  Martinique   On: 07/25/2014 11:35    Scheduled Meds: . antiseptic oral rinse  7 mL Mouth Rinse BID  . feeding supplement (ENSURE ENLIVE)  237 mL Oral TID BM  . Influenza vac split quadrivalent PF  0.5 mL Intramuscular Tomorrow-1000  . mirtazapine  30 mg Oral QHS  . nystatin  5 mL Oral QID  . pantoprazole  40 mg Oral Daily   Continuous Infusions:   Antibiotics Given (last 72 hours)    Date/Time Action Medication Dose Rate   07/23/14 1833 Given   Ampicillin-Sulbactam (UNASYN) 3 g in sodium chloride 0.9 % 100 mL IVPB 3 g 100 mL/hr   07/24/14 0200 Given   Ampicillin-Sulbactam (UNASYN) 3 g in sodium chloride 0.9 % 100 mL IVPB 3 g 100 mL/hr   07/24/14 3007 Given  [barcode was torn]   Ampicillin-Sulbactam (UNASYN) 3 g in sodium chloride 0.9 % 100 mL IVPB 3 g 100 mL/hr   07/24/14 1141 Given   Ampicillin-Sulbactam (UNASYN) 3 g in sodium chloride 0.9 % 100 mL IVPB 3 g 100 mL/hr   07/24/14 1800 Given   Ampicillin-Sulbactam (UNASYN) 3 g in sodium chloride 0.9 % 100 mL IVPB 3 g 100 mL/hr   07/25/14 0025 Given   Ampicillin-Sulbactam (UNASYN) 3 g in sodium chloride 0.9 % 100 mL IVPB 3 g 100 mL/hr   07/25/14 0509 Given   Ampicillin-Sulbactam (UNASYN) 3 g in sodium chloride 0.9 % 100 mL IVPB 3 g 100 mL/hr   07/25/14 1208 Given   Ampicillin-Sulbactam (UNASYN) 3 g in sodium chloride 0.9 % 100 mL IVPB 3 g 100 mL/hr      Time spent: 15 min  Carl Carey  [facility] [email or web address], password [facility] 07/26/2014, 4:17 PM  LOS: 5 days

## 2014-07-26 NOTE — Progress Notes (Signed)
Speech Language Pathology Treatment: Dysphagia  Patient Details Name: Carl Carey MRN: [id number] DOB: [birth year] Today's Date: 07/26/2014 Time: 3748-2707 SLP Time Calculation (min) (ACUTE ONLY): 22 min  Assessment / Plan / Recommendation Clinical Impression  SLP provided skilled observation with advanced trials of Dys 2 textures. Pt has intermittent difficulty initiating/terminating self-feeding task, however shows adequate mastication with Dys 2 and regular textures. He has mild oral holding, but utilizes liquid was with Min cues from therapist to facilitate oral transit. Trace to mild residuals remain, which are also cleared with liquid wash. Pt ate an entire package of graham crackers this morning - will advanced to Dys 2 diet given overall mentation at this time, to facilitate oral phase of swallow. Pt will continue to require full supervision for safety.   HPI HPI: 34 year old male admitted 07/21/14 due to difficulty swallowing for the past 6 weeks. PMH significant for panic attacks. MRI, CXR negative. Pt has several dental abscesses, but does not report oral pain.   Pertinent Vitals Pain Assessment: No/denies pain  SLP Plan  Continue with current plan of care    Recommendations Diet recommendations: Dysphagia 2 (fine chop);Thin liquid Liquids provided via: Cup;Straw Medication Administration: Crushed with puree Supervision: Patient able to self feed;Full supervision/cueing for compensatory strategies Compensations: Slow rate;Small sips/bites;Check for pocketing;Check for anterior loss;Follow solids with liquid Postural Changes and/or Swallow Maneuvers: Seated upright 90 degrees;Upright 30-60 min after meal       Oral Care Recommendations: Oral care BID Follow up Recommendations: 24 hour supervision/assistance;Inpatient Rehab Plan: Continue with current plan of care    Germain Osgood, M.A. CCC-SLP [phone]  Germain Osgood 07/26/2014, 10:11 AM

## 2014-07-26 NOTE — Progress Notes (Signed)
PRE-OPERATIVE NOTE:  07/26/2014   [name] [id number]  VITALS: BP 99/49 mmHg  Pulse 88  Temp(Src) 98.7 F (37.1 C) (Oral)  Resp 18  Ht 5' 8"  (1.727 m)  Wt 136 lb 9.6 oz (61.961 kg)  BMI 20.77 kg/m2  SpO2 100%  Lab Results  Component Value Date   WBC 4.6 07/24/2014   HGB 10.7* 07/24/2014   HCT 34.5* 07/24/2014   MCV 75.2* 07/24/2014   PLT 334 07/24/2014   BMET    Component Value Date/Time   NA 138 07/24/2014 0447   K 3.6 07/24/2014 0447   CL 105 07/24/2014 0447   CO2 29 07/24/2014 0447   GLUCOSE 73 07/24/2014 0447   BUN <5* 07/24/2014 0447   CREATININE 0.82 07/24/2014 0447   CALCIUM 8.1* 07/24/2014 0447   GFRNONAA >90 07/24/2014 0447   GFRAA >90 07/24/2014 0447    No results found for: INR, PROTIME No results found for: PTT   [name] is scheduled for multiple dental extractions with alveoloplasty and gross debridement of remaining dentition in the operating room with general anesthesia on Monday, 07/29/14 at 7:30 AM. Plan to proceed with extraction of tooth numbers 1, 15, 16, 18 with alveoloplasty and consideration of tooth #17 for extraction if indicated. Patient will also have a gross debridement of his remaining teeth at that time.  SUBJECTIVE: The patient denies any acute medical or dental changes and agrees to proceed with treatment as planned.  EXAM: No sign of acute dental changes.  ASSESSMENT: Patient is affected by multiple retained root segments, chronic apical periodontitis, dental caries, chronic periodontitis, and accretions.  PLAN: Patient agrees to proceed with treatment as planned in the operating room as previously discussed and accepts the risks, benefits, and complications of the proposed treatment. Patient is aware of the risk for bleeding, bruising, swelling, infection, pain, nerve damage, soft tissue damage, damage to adjacent teeth, sinus involvement, root tip fracture, mandible fracture, and the risks of complications associated  with the anesthesia. Patient also is aware of the potential for other complications not mentioned above.   Lenn Cal, DDS

## 2014-07-26 NOTE — Progress Notes (Signed)
Physical Therapy Treatment Patient Details Name: Carl Carey MRN: [id number] DOB: March 05, 1981 Today's Date: 07/26/2014    History of Present Illness Pt is a 34 y.o. male with no significant PMH has been experiencing difficulty swallowing over the last 6 weeks. Patient was recently brought to home by patient's family from [place]. Per chart, Patient finds it difficult to swallow. In addition patient has been having difficulty walking with gait difficulties and tremors. In the ER patient had CT maxillofacial and soft tissue neck and MRI brain. Nothing acute was seen but did show some periapical abscess. Labs show anemia. Work up underway.    PT Comments    Pt progressing slowly towards physical therapy goals. Pt with poor coordination of upper and lower body activity. Improving with ability to perform taks with the UE's, however needs to be standing still to do so. Noted that CIR is not able to take pt at d/c, and d/c recommendations updated to reflect home with mother. Will continue to follow and progress as able per POC.   Follow Up Recommendations  Home health PT;Supervision/Assistance - 24 hour     Equipment Recommendations  Other (comment) (Shower seat; adaptive equipment per OT recommendations)    Recommendations for Other Services       Precautions / Restrictions Precautions Precautions: Fall Restrictions Weight Bearing Restrictions: No    Mobility  Bed Mobility Overal bed mobility: Needs Assistance Bed Mobility: Supine to Sit;Sit to Supine     Supine to sit: Supervision Sit to supine: Supervision   General bed mobility comments: assist only for management of bed linen  Transfers Overall transfer level: Needs assistance Equipment used: None Transfers: Sit to/from Stand Sit to Stand: Supervision         General transfer comment: Supervision for safety. Increased time required and kept UE's in a high guard position.   Ambulation/Gait Ambulation/Gait  assistance: Supervision;Min guard Ambulation Distance (Feet): 275 Feet Assistive device: Rolling walker (2 wheeled);1 person hand held assist Gait Pattern/deviations: Step-through pattern;Decreased stride length;Trunk flexed Gait velocity: Decreased Gait velocity interpretation: Below normal speed for age/gender General Gait Details: Pt with no noted trunk rotation or arm swing. Would move head minimally with cueing but upper body very stiff. No LOB. With RW, pt at supervision level however with no AD, min guard assist is appropriate.    Stairs            Wheelchair Mobility    Modified Rankin (Stroke Patients Only)       Balance Overall balance assessment: Needs assistance Sitting-balance support: Feet supported;No upper extremity supported Sitting balance-Leahy Scale: Good Sitting balance - Comments: Increased time to achieve full sitting EOB but was able to hold without assist.    Standing balance support: No upper extremity supported Standing balance-Leahy Scale: Fair                      Cognition Arousal/Alertness: Awake/alert Behavior During Therapy: Flat affect Overall Cognitive Status: Impaired/Different from baseline Area of Impairment: Attention;Following commands;Safety/judgement;Awareness;Problem solving   Current Attention Level: Selective   Following Commands: Follows one step commands with increased time Safety/Judgement: Decreased awareness of deficits Awareness: Intellectual Problem Solving: Slow processing;Decreased initiation;Difficulty sequencing;Requires verbal cues;Requires tactile cues General Comments: Difficulty terminating task.required tactile cues to continue/terminate task    Exercises      General Comments        Pertinent Vitals/Pain Pain Assessment: No/denies pain    Home Living  Prior Function            PT Goals (current goals can now be found in the care plan section) Acute Rehab  PT Goals Patient Stated Goal: Go back to bed PT Goal Formulation: With patient Time For Goal Achievement: 08/05/14 Potential to Achieve Goals: Good Progress towards PT goals: Progressing toward goals    Frequency  Min 3X/week    PT Plan Discharge plan needs to be updated    Co-evaluation             End of Session Equipment Utilized During Treatment: Gait belt Activity Tolerance: Patient tolerated treatment well Patient left: in bed;with call bell/phone within reach     Time: 1202-1226 PT Time Calculation (min) (ACUTE ONLY): 24 min  Charges:  $Gait Training: 8-22 mins $Therapeutic Activity: 8-22 mins                    G Codes:      Rolinda Roan 2014-08-20, 1:01 PM   Rolinda Roan, PT, DPT Acute Rehabilitation Services Pager: [phone]

## 2014-07-26 NOTE — Consult Note (Signed)
Psychiatry Consult follow up  Reason for Consult:  Depression, anxiety, bizarre behaviors Referring Physician:  Dr. Eliseo Squires Patient Identification: Carl Carey MRN:  [id number] Principal Diagnosis: Dysphagia Diagnosis:   Patient Active Problem List   Diagnosis Date Noted  . Fever in adult [R50.9] 07/25/2014  . Protein-calorie malnutrition, severe [E43] 07/23/2014  . Parkinsonian features [R25.9] 07/21/2014  . Dysphagia [R13.10] 07/21/2014  . Periapical abscess [K04.7] 07/21/2014  . Microcytic anemia [D50.9] 07/21/2014    Total Time spent with patient: 20 minutes  Subjective:   Carl Carey is a 34 y.o. male patient admitted with generalized weakness, dysphagia and depression, anxiety, suicidal thoughts and behaviors.  HPI:  Carl Carey is an 33 y.o. male seen face-to-face for psychiatric evaluation for depression, anxiety, passive suicidal ideation and old behaviors. Reportedly patient has no previous acute psychiatric hospitalization or treatment. Patient was seen with his mother and a cousin in his room. Patient reported feeling depressed, sad, anxious, worried, weak, not able to walk and not able to swallow solid food. Reportedly he has been drinking fluids over 6-7 weeks. Patient mother and cousin reported that patient has been drifting away from home for the last 7 years and going to different cities like [place], [place] and [place] and living as a homeless. Reportedly he was sometimes stayed in homeless shelter, taking shower once a week and eat food wherever he can get. Patient also reported panhandling money to move around different cities. 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 Patient reported feeling anxiety about not able to swallow food. Patient stated having the suicidal thoughts sometimes but no intention or plans. Patient has no evidence of auditory/visual  hallucinations, delusions or paranoia during this evaluation. Patient denied history of drinking alcohol and abusing illicit drugs.  July 26, 2014  Interval History: Patient has no complaints. Continue to complain about not able to chew and swallow. He has been talking with less stuttering today and seen speaking on phone some what better telling that he is been treated with IV medication. Patient states that he has slept and drinking fluids with some difficulty. Patient has generalized weakness and able to walk with physical therapy with walker. He was neglected himself too long before arrived to his family. He has no family at bed side today. Patient has normal limits of heavy metals and he is not meeting criteria for rehabilitation.   Medical history: Patient with a very confusing history. 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 The last the family has seen the patient was about 6-7 years ago. They got a call at the end of the week from [facility] asking them to pick him up. They brought him home where he has been unable to eat because he says he can not swallow. His mother reports that he has been drinking tea. He walks slowly. His speech is slurred and he stutters and can not get a lot of his words out intelligibly. He has been drooling. He reports that he has been this way for about 6 weeks but was able to get on a Grey[employer] bus from [place] to [place] about 2 weeks ago. He was prescribed Reglan and Protonix at [place]. His mother has been administering twice the prescribed dose. He reports he has been on no medications  prior to this. He does not report any exposures. He has been living on the street. When they last saw the patient 6-7 years ago he was normal.   Past Medical History:  Past Medical History  Diagnosis Date  . Panic attack     Past Surgical History  Procedure Laterality Date  . Neck surgery      Family History:  Family History  Problem Relation Age of Onset  . Hypertension Maternal Grandmother    Social History:  History  Alcohol Use No     History  Drug Use No    History   Social History  . Marital Status: Single    Spouse Name: N/A  . Number of Children: N/A  . Years of Education: N/A   Social History Main Topics  . Smoking status: Never Smoker   . Smokeless tobacco: Not on file  . Alcohol Use: No  . Drug Use: No  . Sexual Activity: Not on file   Other Topics Concern  . None   Social History Narrative  . None   Additional Social History:                          Allergies:  No Known Allergies  Labs:  Results for orders placed or performed during the hospital encounter of 07/21/14 (from the past 48 hour(s))  Clostridium Difficile by PCR     Status: None   Collection Time: 07/25/14  5:04 AM  Result Value Ref Range   C difficile by pcr NEGATIVE NEGATIVE  Glucose, capillary     Status: None   Collection Time: 07/25/14  7:29 AM  Result Value Ref Range   Glucose-Capillary 77 70 - 99 mg/dL  Culture, blood (routine x 2)     Status: None (Preliminary result)   Collection Time: 07/25/14  9:10 AM  Result Value Ref Range   Specimen Description BLOOD LEFT HAND    Special Requests BOTTLES DRAWN AEROBIC ONLY 10 CC    Culture             BLOOD CULTURE RECEIVED NO GROWTH TO DATE CULTURE WILL BE HELD FOR 5 DAYS BEFORE ISSUING A FINAL NEGATIVE REPORT Performed at [facility]    Report Status PENDING   Culture, blood (routine x 2)     Status: None (Preliminary result)   Collection Time: 07/25/14  9:15 AM  Result Value Ref Range   Specimen Description BLOOD RIGHT HAND    Special Requests BOTTLES DRAWN AEROBIC ONLY 10 CC    Culture             BLOOD CULTURE RECEIVED NO GROWTH TO DATE CULTURE WILL BE HELD FOR 5 DAYS BEFORE ISSUING A FINAL NEGATIVE REPORT Performed at [facility]    Report Status PENDING   Influenza panel by PCR  (type A & B, H1N1)     Status: None   Collection Time: 07/25/14  2:48 PM  Result Value Ref Range   Influenza A By PCR NEGATIVE NEGATIVE   Influenza B By PCR NEGATIVE NEGATIVE   H1N1 flu by pcr NOT DETECTED NOT DETECTED    Comment:        The Xpert Flu assay (FDA approved for nasal aspirates or washes and nasopharyngeal swab specimens), is intended as an aid in the diagnosis of influenza and should not be used as a sole basis for treatment.   Urinalysis, Routine w reflex microscopic  Status: None   Collection Time: 07/25/14  3:14 PM  Result Value Ref Range   Color, Urine YELLOW YELLOW   APPearance CLEAR CLEAR   Specific Gravity, Urine 1.025 1.005 - 1.030   pH 7.0 5.0 - 8.0   Glucose, UA NEGATIVE NEGATIVE mg/dL   Hgb urine dipstick NEGATIVE NEGATIVE   Bilirubin Urine NEGATIVE NEGATIVE   Ketones, ur NEGATIVE NEGATIVE mg/dL   Protein, ur NEGATIVE NEGATIVE mg/dL   Urobilinogen, UA 0.2 0.0 - 1.0 mg/dL   Nitrite NEGATIVE NEGATIVE   Leukocytes, UA NEGATIVE NEGATIVE    Comment: MICROSCOPIC NOT DONE ON URINES WITH NEGATIVE PROTEIN, BLOOD, LEUKOCYTES, NITRITE, OR GLUCOSE <1000 mg/dL.    Vitals: Blood pressure 99/49, pulse 88, temperature 98.7 F (37.1 C), temperature source Oral, resp. rate 18, height 5' 8"  (1.727 m), weight 61.961 kg (136 lb 9.6 oz), SpO2 100 %.  Risk to Self: Is patient at risk for suicide?: No Risk to Others:   Prior Inpatient Therapy:   Prior Outpatient Therapy:    Current Facility-Administered Medications  Medication Dose Route Frequency Provider Last Rate Last Dose  . acetaminophen (TYLENOL) tablet 650 mg  650 mg Oral Q6H PRN Rise Patience, MD   650 mg at 07/25/14 2110   Or  . acetaminophen (TYLENOL) suppository 650 mg  650 mg Rectal Q6H PRN Rise Patience, MD      . antiseptic oral rinse (CPC / CETYLPYRIDINIUM CHLORIDE 0.05%) solution 7 mL  7 mL Mouth Rinse BID Rise Patience, MD   7 mL at 07/26/14 1000  . feeding supplement (ENSURE  ENLIVE) (ENSURE ENLIVE) liquid 237 mL  237 mL Oral TID BM Stephanie La, RD   237 mL at 07/26/14 1000  . fluconazole (DIFLUCAN) IVPB 100 mg  100 mg Intravenous Q24H Geradine Girt, DO   100 mg at 07/26/14 1059  . Influenza vac split quadrivalent PF (FLUARIX) injection 0.5 mL  0.5 mL Intramuscular Tomorrow-1000 Jessica U Vann, DO      . mirtazapine (REMERON SOL-TAB) disintegrating tablet 30 mg  30 mg Oral QHS Ambrose Finland, MD   30 mg at 07/25/14 2110  . nystatin (MYCOSTATIN) 100000 UNIT/ML suspension 500,000 Units  5 mL Oral QID Geradine Girt, DO   500,000 Units at 07/26/14 1059  . ondansetron (ZOFRAN) tablet 4 mg  4 mg Oral Q6H PRN Rise Patience, MD       Or  . ondansetron [facility]) injection 4 mg  4 mg Intravenous Q6H PRN Rise Patience, MD      . Derrill Memo ON 07/27/2014] pantoprazole sodium (PROTONIX) 40 mg/20 mL oral suspension 40 mg  40 mg Oral Daily Lauren D Bajbus, RPH      . polyvinyl alcohol (LIQUIFILM TEARS) 1.4 % ophthalmic solution 1 drop  1 drop Both Eyes PRN Geradine Girt, DO        Musculoskeletal: Strength & Muscle Tone: decreased Gait & Station: unable to stand Patient leans: N/A  Psychiatric Specialty Exam: Physical Exam  ROS  Blood pressure 99/49, pulse 88, temperature 98.7 F (37.1 C), temperature source Oral, resp. rate 18, height 5' 8"  (1.727 m), weight 61.961 kg (136 lb 9.6 oz), SpO2 100 %.Body mass index is 20.77 kg/(m^2).  General Appearance: Guarded  Eye Contact::  Fair  Speech:  Slurred and stuttering  Volume:  Decreased  Mood:  Anxious and Depressed  Affect:  Constricted and Depressed  Thought Process:  Coherent and Goal Directed  Orientation:  Full (Time, Place, and  Person)  Thought Content:  WDL  Suicidal Thoughts:  No  Homicidal Thoughts:  No  Memory:  Immediate;   Fair Recent;   Fair  Judgement:  Impaired  Insight:  Shallow  Psychomotor Activity:  Decreased  Concentration:  Fair  Recall:  Good  Fund of Knowledge:Good  Language:  Good  Akathisia:  NA  Handed:  Right  AIMS (if indicated):     Assets:  Communication Skills Desire for Improvement Housing Intimacy Leisure Time Resilience Social Support  ADL's:  Impaired  Cognition: Impaired,  Mild  Sleep:      Medical Decision Making: New problem, with additional work up planned, Review of Psycho-Social Stressors (1), Review or order clinical lab tests (1), Review or order medicine tests (1), Review of Medication Regimen & Side Effects (2) and Review of New Medication or Change in Dosage (2)  Treatment Plan Summary: Daily contact with patient to assess and evaluate symptoms and progress in treatment and Medication management  Plan: Case discussed with Dr. Eliseo Squires  Continue Remeron soluble tablet 30 mg at bedtime for depression, insomnia and poor appetite Patient does not meet criteria for psychiatric inpatient admission. Supportive therapy provided about ongoing stressors. Appreciate psychiatric consultation and follow up as clinically required Please contact [phone] or [phone] if needs further assistance  Disposition: Patient may be discharged to home with the outpatient psychiatric services and medically stable.  Chee Dimon,JANARDHAHA R. 07/26/2014 1:11 PM

## 2014-07-26 NOTE — Progress Notes (Signed)
Occupational Therapy Treatment Patient Details Name: Carl Carey MRN: [id number] DOB: 02-Jun-1980 Today's Date: 07/26/2014    History of present illness Pt is a 34 y.o. male with no significant PMH has been experiencing difficulty swallowing over the last 6 weeks. Patient was recently brought to home by patient's family from [place]. Per chart, Patient finds it difficult to swallow. In addition patient has been having difficulty walking with gait difficulties and tremors. In the ER patient had CT maxillofacial and soft tissue neck and MRI brain. Nothing acute was seen but did show some periapical abscess. Labs show anemia. Work up underway.   OT comments  This pt making progress since beginning of week when he was evaled. Will continue to benefit from OT acute OT with follow up at home.  Follow Up Recommendations  Home health OT;Supervision/Assistance - 24 hour    Equipment Recommendations  None recommended by OT       Precautions / Restrictions Precautions Precautions: Fall Restrictions Weight Bearing Restrictions: No       Mobility Bed Mobility Overal bed mobility: Needs Assistance Bed Mobility: Supine to Sit;Sit to Supine     Supine to sit: Supervision Sit to supine: Supervision    Transfers Overall transfer level: Needs assistance Equipment used: None Transfers: Sit to/from Stand Sit to Stand: Supervision                ADL Overall ADL's : Needs assistance/impaired     Grooming: Oral care;Supervision/safety;Set up;Standing Grooming Details (indicate cue type and reason): VCs for sequencing                 Toilet Transfer: Supervision/safety;Ambulation;Regular Toilet;Grab bars   Toileting- Clothing Manipulation and Hygiene: Supervision/safety;Sit to/from stand                          Cognition   Behavior During Therapy: Flat affect Overall Cognitive Status: Impaired/Different from baseline Area of Impairment: Problem  solving   Current Attention Level: Selective    Following Commands: Follows one step commands consistently Safety/Judgement: Decreased awareness of deficits Awareness: Intellectual Problem Solving: Difficulty sequencing;Decreased initiation                 Pertinent Vitals/ Pain       Pain Assessment: No/denies pain Pain Score: 0-No pain                   Frequency Min 2X/week     Progress Toward Goals  OT Goals(current goals can now be found in the care plan section)  Progress towards OT goals: Progressing toward goals  Acute Rehab OT Goals Patient Stated Goal: Go back to bed  Plan Discharge plan remains appropriate          Activity Tolerance Patient tolerated treatment well   Patient Left in bed;with call bell/phone within reach;with bed alarm set           Time: 1500-1515 OT Time Calculation (min): 15 min  Charges: OT General Charges $OT Visit: 1 Procedure OT Treatments $Self Care/Home Management : 8-22 mins  Almon Register [phone] 07/26/2014, 3:56 PM

## 2014-07-27 LAB — URINE CULTURE
Colony Count: NO GROWTH
Culture: NO GROWTH

## 2014-07-27 NOTE — Progress Notes (Signed)
PROGRESS NOTE  Carl Carey MLY:[id number] DOB: Jan 22, 1981 DOA: 07/21/2014 PCP: No primary care provider on file.  Carl Carey is an 34 y.o. male with a very confusing history. 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 The last the family has seen the patient was about 6-7 years ago. They got a call at the end of the week from [facility] asking them to pick him up. They brought him home where he has been unable to eat because he says he can not swallow. His mother reports that he has been drinking tea. He walks slowly. His speech is slurred and he stutters and can not get a lot of his words out intelligibly. He has been drooling. He reports that he has been this way for about 6 weeks but was able to get on a Greyhound bus from [place] to [place] about 2 weeks ago. He was prescribed Reglan and Protonix at [place]. His mother has been administering twice the prescribed dose. He reports he has been on no medications prior to this. He does not report any exposures. He has been living on the street.  When they last saw the patient 6-7 years ago he was normal.  Records from [place] were reviewed.  Patient had drooling and stuttering there before he was given reglan.  He had a barium swallow with reflux and hiatal hernia.  His drooling was deemed psych and patient was sent home.  Assessment/Plan: Fever: workup negative. None for 36 hours  Loose stool: c diff neg.   Dysphagia cause unclear. Due to poor dentition? Psych related?  Diet advanced to D2 - Neuro: sedimentation rate ok: RPR HIV ok; heavy metal screening negative -d/c Reglan: one dose of Benadryl was given with not much response- was drooling before reglan given Continue antifungal and ppi for now.  Dr. Enrique Sack to extract multiple teeth Monday.  Hypokalemia corrected  Periapical abscesses see above  Anemia - stable Iron low- will replace with IV x 1 and   replace once patient able to tolerate PO  Severe Protein calorie malnutrition - nutrition consult -Mg/Phos ok  Code Status: full Family Communication:  Disposition Plan: home when stable   Consultants:  Dental  Psych  PT/OT  Procedures:      HPI/Subjective: "2 % better".  Per RN tolerating diet, ambulating, no new issues  Objective: Filed Vitals:   07/27/14 0935  BP: 91/48  Pulse: 75  Temp: 98.6 F (37 C)  Resp: 17    Intake/Output Summary (Last 24 hours) at 07/27/14 1449 Last data filed at 07/27/14 1200  Gross per 24 hour  Intake    480 ml  Output   1425 ml  Net   -945 ml   Filed Weights   07/24/14 2100 07/25/14 2058 07/26/14 2202  Weight: 59.8 kg (131 lb 13.4 oz) 61.961 kg (136 lb 9.6 oz) 61.417 kg (135 lb 6.4 oz)    Exam:   General:  Alert, oriented to person, place, time. Stuttering speech. Bradykinesia  HEENT: wont open mouth very wide. MMM  Cardiovascular: rrr  Respiratory: clear  Abdomen: +BS, soft  Musculoskeletal: no edema   Data Reviewed: Basic Metabolic Panel:  Recent Labs Lab 07/21/14 1619 07/22/14 0555 07/23/14 1620 07/24/14 0442 07/24/14 0447  NA 139 140 140  --  138  K 3.1* 3.2* 3.9  --  3.6  CL 103 104 103  --  105  CO2 29  27 27  --  29  GLUCOSE 90 98 80  --  73  BUN <5* <5* <5*  --  <5*  CREATININE 0.81 0.94 0.84  --  0.82  CALCIUM 8.8 8.5 8.3*  --  8.1*  MG 1.8  --   --  1.9  --   PHOS  --   --   --  3.7  --    Liver Function Tests:  Recent Labs Lab 07/21/14 1619 07/22/14 0555  AST 19 18  ALT 13 11  ALKPHOS 62 59  BILITOT 0.7 1.1  PROT 6.0 5.7*  ALBUMIN 2.9* 2.7*   No results for input(s): LIPASE, AMYLASE in the last 168 hours. No results for input(s): AMMONIA in the last 168 hours. CBC:  Recent Labs Lab 07/21/14 1619 07/22/14 0555 07/24/14 0447  WBC 6.0 5.0 4.6  NEUTROABS 4.6 3.3  --   HGB 11.7* 11.1* 10.7*  HCT 37.3* 35.4* 34.5*  MCV 74.7* 75.2* 75.2*  PLT 360 361 334   Cardiac  Enzymes:  Recent Labs Lab 07/21/14 1619 07/22/14 0013 07/22/14 0555 07/22/14 1115  CKTOTAL 341*  --  218  --   TROPONINI  --  <0.03 <0.03 <0.03   BNP (last 3 results) No results for input(s): BNP in the last 8760 hours.  ProBNP (last 3 results) No results for input(s): PROBNP in the last 8760 hours.  CBG:  Recent Labs Lab 07/25/14 0729  GLUCAP 77    Recent Results (from the past 240 hour(s))  MRSA PCR Screening     Status: None   Collection Time: 07/21/14 11:55 PM  Result Value Ref Range Status   MRSA by PCR NEGATIVE NEGATIVE Final    Comment:        The GeneXpert MRSA Assay (FDA approved for NASAL specimens only), is one component of a comprehensive MRSA colonization surveillance program. It is not intended to diagnose MRSA infection nor to guide or monitor treatment for MRSA infections.   Clostridium Difficile by PCR     Status: None   Collection Time: 07/25/14  5:04 AM  Result Value Ref Range Status   C difficile by pcr NEGATIVE NEGATIVE Final  Culture, blood (routine x 2)     Status: None (Preliminary result)   Collection Time: 07/25/14  9:10 AM  Result Value Ref Range Status   Specimen Description BLOOD LEFT HAND  Final   Special Requests BOTTLES DRAWN AEROBIC ONLY 10 CC  Final   Culture   Final           BLOOD CULTURE RECEIVED NO GROWTH TO DATE CULTURE WILL BE HELD FOR 5 DAYS BEFORE ISSUING A FINAL NEGATIVE REPORT Performed at [facility]    Report Status PENDING  Incomplete  Culture, blood (routine x 2)     Status: None (Preliminary result)   Collection Time: 07/25/14  9:15 AM  Result Value Ref Range Status   Specimen Description BLOOD RIGHT HAND  Final   Special Requests BOTTLES DRAWN AEROBIC ONLY 10 CC  Final   Culture   Final           BLOOD CULTURE RECEIVED NO GROWTH TO DATE CULTURE WILL BE HELD FOR 5 DAYS BEFORE ISSUING A FINAL NEGATIVE REPORT Performed at [facility]    Report Status PENDING  Incomplete  Urine culture      Status: None   Collection Time: 07/25/14  3:14 PM  Result Value Ref Range Status   Specimen Description URINE, RANDOM  Final   Special Requests NONE  Final   Colony Count NO GROWTH Performed at [facility]   Final   Culture NO GROWTH Performed at [facility]   Final   Report Status 07/27/2014 FINAL  Final     Studies: No results found.  Scheduled Meds: . antiseptic oral rinse  7 mL Mouth Rinse BID  . feeding supplement (ENSURE ENLIVE)  237 mL Oral TID BM  . Influenza vac split quadrivalent PF  0.5 mL Intramuscular Tomorrow-1000  . mirtazapine  30 mg Oral QHS  . nystatin  5 mL Oral QID  . pantoprazole  40 mg Oral Daily   Continuous Infusions:   Antibiotics Given (last 72 hours)    Date/Time Action Medication Dose Rate   07/24/14 1800 Given   Ampicillin-Sulbactam (UNASYN) 3 g in sodium chloride 0.9 % 100 mL IVPB 3 g 100 mL/hr   07/25/14 0025 Given   Ampicillin-Sulbactam (UNASYN) 3 g in sodium chloride 0.9 % 100 mL IVPB 3 g 100 mL/hr   07/25/14 0509 Given   Ampicillin-Sulbactam (UNASYN) 3 g in sodium chloride 0.9 % 100 mL IVPB 3 g 100 mL/hr   07/25/14 1208 Given   Ampicillin-Sulbactam (UNASYN) 3 g in sodium chloride 0.9 % 100 mL IVPB 3 g 100 mL/hr      Time spent: 15 min  [facility] [email or web address], password [facility] 07/27/2014, 2:49 PM  LOS: 6 days

## 2014-07-28 MED ORDER — CEFAZOLIN SODIUM-DEXTROSE 2-3 GM-% IV SOLR
2.0000 g | INTRAVENOUS | Status: AC
  Administered 2014-07-29: 2 g via INTRAVENOUS
  Filled 2014-07-28: qty 50

## 2014-07-28 NOTE — Progress Notes (Signed)
Consent signed and in chart for OR dental extractions.

## 2014-07-28 NOTE — Progress Notes (Signed)
PROGRESS NOTE  Carl Carey KGU:[id number] DOB: 1980/11/07 DOA: 07/21/2014 PCP: No primary care provider on file.  Carl Carey is an 34 y.o. male with a very confusing history. 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 The last the family has seen the patient was about 6-7 years ago. They got a call at the end of the week from [facility] asking them to pick him up. They brought him home where he has been unable to eat because he says he can not swallow. His mother reports that he has been drinking tea. He walks slowly. His speech is slurred and he stutters and can not get a lot of his words out intelligibly. He has been drooling. He reports that he has been this way for about 6 weeks but was able to get on a Greyhound bus from [place] to [place] about 2 weeks ago. He was prescribed Reglan and Protonix at [place]. His mother has been administering twice the prescribed dose. He reports he has been on no medications prior to this. He does not report any exposures. He has been living on the street.  When they last saw the patient 6-7 years ago he was normal.  Records from [place] were reviewed.  Patient had drooling and stuttering there before he was given reglan.  He had a barium swallow with reflux and hiatal hernia.  His drooling was deemed psych and patient was sent home.  Assessment/Plan: Fever: workup negative. None further  Loose stool: c diff neg.   Dysphagia cause unclear. Due to poor dentition? Psych related?  Diet advanced to D2 - Neuro: sedimentation rate ok: RPR HIV ok; heavy metal screening negative -d/c Reglan: one dose of Benadryl was given with not much response- was drooling before reglan given Continue ppi for now. D/c nystatin.  Dr. Enrique Sack to extract multiple teeth Monday.  Hypokalemia corrected  Periapical abscesses see above  Anemia - stable Iron low- will replace with IV x 1 and  replace  once patient able to tolerate PO  Severe Protein calorie malnutrition - nutrition consult -Mg/Phos ok  Code Status: full Family Communication:  Disposition Plan: home tomorrow after dental procedures?   Consultants:  Dental  Psych  PT/OT  Procedures:      HPI/Subjective: "3 % better".  Per RN tolerating diet, ambulating, no new issues  Objective: Filed Vitals:   07/28/14 0741  BP: 88/53  Pulse: 80  Temp: 97.2 F (36.2 C)  Resp: 18    Intake/Output Summary (Last 24 hours) at 07/28/14 1427 Last data filed at 07/28/14 1427  Gross per 24 hour  Intake    297 ml  Output   1875 ml  Net  -1578 ml   Filed Weights   07/25/14 2058 07/26/14 2202 07/27/14 2012  Weight: 61.961 kg (136 lb 9.6 oz) 61.417 kg (135 lb 6.4 oz) 59.512 kg (131 lb 3.2 oz)    Exam:   General:  Alert, oriented to person, place, time. Stuttering speech. Bradykinesia. Lunch tray 90% eaten at bedside.  HEENT: wont open mouth very wide. MMM  Cardiovascular: rrr  Respiratory: clear  Abdomen: +BS, soft  Musculoskeletal: no edema   Data Reviewed: Basic Metabolic Panel:  Recent Labs Lab 07/21/14 1619 07/22/14 0555 07/23/14 1620 07/24/14 0442 07/24/14 0447  NA 139 140 140  --  138  K 3.1* 3.2* 3.9  --  3.6  CL 103 104 103  --  105  CO2 29 27 27   --  29  GLUCOSE 90 98 80  --  73  BUN <5* <5* <5*  --  <5*  CREATININE 0.81 0.94 0.84  --  0.82  CALCIUM 8.8 8.5 8.3*  --  8.1*  MG 1.8  --   --  1.9  --   PHOS  --   --   --  3.7  --    Liver Function Tests:  Recent Labs Lab 07/21/14 1619 07/22/14 0555  AST 19 18  ALT 13 11  ALKPHOS 62 59  BILITOT 0.7 1.1  PROT 6.0 5.7*  ALBUMIN 2.9* 2.7*   No results for input(s): LIPASE, AMYLASE in the last 168 hours. No results for input(s): AMMONIA in the last 168 hours. CBC:  Recent Labs Lab 07/21/14 1619 07/22/14 0555 07/24/14 0447  WBC 6.0 5.0 4.6  NEUTROABS 4.6 3.3  --   HGB 11.7* 11.1* 10.7*  HCT 37.3* 35.4* 34.5*  MCV  74.7* 75.2* 75.2*  PLT 360 361 334   Cardiac Enzymes:  Recent Labs Lab 07/21/14 1619 07/22/14 0013 07/22/14 0555 07/22/14 1115  CKTOTAL 341*  --  218  --   TROPONINI  --  <0.03 <0.03 <0.03   BNP (last 3 results) No results for input(s): BNP in the last 8760 hours.  ProBNP (last 3 results) No results for input(s): PROBNP in the last 8760 hours.  CBG:  Recent Labs Lab 07/25/14 0729  GLUCAP 77    Recent Results (from the past 240 hour(s))  MRSA PCR Screening     Status: None   Collection Time: 07/21/14 11:55 PM  Result Value Ref Range Status   MRSA by PCR NEGATIVE NEGATIVE Final    Comment:        The GeneXpert MRSA Assay (FDA approved for NASAL specimens only), is one component of a comprehensive MRSA colonization surveillance program. It is not intended to diagnose MRSA infection nor to guide or monitor treatment for MRSA infections.   Clostridium Difficile by PCR     Status: None   Collection Time: 07/25/14  5:04 AM  Result Value Ref Range Status   C difficile by pcr NEGATIVE NEGATIVE Final  Culture, blood (routine x 2)     Status: None (Preliminary result)   Collection Time: 07/25/14  9:10 AM  Result Value Ref Range Status   Specimen Description BLOOD LEFT HAND  Final   Special Requests BOTTLES DRAWN AEROBIC ONLY 10 CC  Final   Culture   Final           BLOOD CULTURE RECEIVED NO GROWTH TO DATE CULTURE WILL BE HELD FOR 5 DAYS BEFORE ISSUING A FINAL NEGATIVE REPORT Performed at [facility]    Report Status PENDING  Incomplete  Culture, blood (routine x 2)     Status: None (Preliminary result)   Collection Time: 07/25/14  9:15 AM  Result Value Ref Range Status   Specimen Description BLOOD RIGHT HAND  Final   Special Requests BOTTLES DRAWN AEROBIC ONLY 10 CC  Final   Culture   Final           BLOOD CULTURE RECEIVED NO GROWTH TO DATE CULTURE WILL BE HELD FOR 5 DAYS BEFORE ISSUING A FINAL NEGATIVE REPORT Performed at [facility]     Report Status PENDING  Incomplete  Urine culture     Status: None   Collection Time: 07/25/14  3:14 PM  Result Value Ref Range Status  Specimen Description URINE, RANDOM  Final   Special Requests NONE  Final   Colony Count NO GROWTH Performed at [facility]   Final   Culture NO GROWTH Performed at [facility]   Final   Report Status 07/27/2014 FINAL  Final     Studies: No results found.  Scheduled Meds: . antiseptic oral rinse  7 mL Mouth Rinse BID  . [START ON 07/29/2014]  ceFAZolin (ANCEF) IV  2 g Intravenous On Call to OR  . feeding supplement (ENSURE ENLIVE)  237 mL Oral TID BM  . Influenza vac split quadrivalent PF  0.5 mL Intramuscular Tomorrow-1000  . mirtazapine  30 mg Oral QHS  . nystatin  5 mL Oral QID  . pantoprazole  40 mg Oral Daily   Continuous Infusions:   Antibiotics Given (last 72 hours)    None      Time spent: 15 min  [facility] [email or web address], password [facility] 07/28/2014, 2:27 PM  LOS: 7 days

## 2014-07-29 ENCOUNTER — Encounter (HOSPITAL_COMMUNITY)
Admission: EM | Disposition: A | Discharge status: Home / Self Care | Payer: Self-pay | Source: Home / Self Care | Attending: Internal Medicine

## 2014-07-29 ENCOUNTER — Inpatient Hospital Stay (HOSPITAL_COMMUNITY): Payer: Medicaid Other | Admitting: Anesthesiology

## 2014-07-29 ENCOUNTER — Encounter (HOSPITAL_COMMUNITY): Payer: Self-pay | Admitting: Anesthesiology

## 2014-07-29 DIAGNOSIS — K045 Chronic apical periodontitis: Secondary | ICD-10-CM | POA: Diagnosis present

## 2014-07-29 DIAGNOSIS — K036 Deposits [accretions] on teeth: Secondary | ICD-10-CM | POA: Diagnosis present

## 2014-07-29 DIAGNOSIS — K083 Retained dental root: Secondary | ICD-10-CM | POA: Diagnosis present

## 2014-07-29 HISTORY — PX: MULTIPLE EXTRACTIONS WITH ALVEOLOPLASTY: SHX5342

## 2014-07-29 LAB — SURGICAL PCR SCREEN
MRSA, PCR: NEGATIVE
Staphylococcus aureus: POSITIVE — AB

## 2014-07-29 SURGERY — MULTIPLE EXTRACTION WITH ALVEOLOPLASTY
Anesthesia: General | Site: Mouth

## 2014-07-29 MED ORDER — MIDAZOLAM HCL 2 MG/2ML IJ SOLN
INTRAMUSCULAR | Status: AC
  Filled 2014-07-29: qty 2

## 2014-07-29 MED ORDER — ONDANSETRON HCL 4 MG/2ML IJ SOLN
INTRAMUSCULAR | Status: DC | PRN
  Administered 2014-07-29: 4 mg via INTRAVENOUS

## 2014-07-29 MED ORDER — SUCCINYLCHOLINE CHLORIDE 20 MG/ML IJ SOLN
INTRAMUSCULAR | Status: DC | PRN
  Administered 2014-07-29: 120 mg via INTRAVENOUS

## 2014-07-29 MED ORDER — LACTATED RINGERS IV SOLN
INTRAVENOUS | Status: DC | PRN
  Administered 2014-07-29: 07:00:00 via INTRAVENOUS

## 2014-07-29 MED ORDER — OXYCODONE-ACETAMINOPHEN 5-325 MG PO TABS: 1.0000 | ORAL_TABLET | Freq: Four times a day (QID) | ORAL | Status: DC | PRN

## 2014-07-29 MED ORDER — OXYMETAZOLINE HCL 0.05 % NA SOLN
NASAL | Status: DC | PRN
  Administered 2014-07-29: 2 via NASAL

## 2014-07-29 MED ORDER — LIDOCAINE-EPINEPHRINE 2 %-1:100000 IJ SOLN
INTRAMUSCULAR | Status: AC
Start: 2014-07-29 — End: 2014-07-29
  Filled 2014-07-29: qty 10.2

## 2014-07-29 MED ORDER — BUPIVACAINE-EPINEPHRINE (PF) 0.5% -1:200000 IJ SOLN
INTRAMUSCULAR | Status: AC
  Filled 2014-07-29: qty 3.6

## 2014-07-29 MED ORDER — FENTANYL CITRATE 0.05 MG/ML IJ SOLN: 25.0000 ug | INTRAMUSCULAR | Status: DC | PRN

## 2014-07-29 MED ORDER — LIDOCAINE HCL (CARDIAC) 20 MG/ML IV SOLN
INTRAVENOUS | Status: DC | PRN
  Administered 2014-07-29: 80 mg via INTRAVENOUS

## 2014-07-29 MED ORDER — LACTATED RINGERS IV SOLN
INTRAVENOUS | Status: DC
  Administered 2014-07-29: 10:00:00 via INTRAVENOUS

## 2014-07-29 MED ORDER — OXYMETAZOLINE HCL 0.05 % NA SOLN
NASAL | Status: AC
  Filled 2014-07-29: qty 15

## 2014-07-29 MED ORDER — PROPOFOL 10 MG/ML IV BOLUS
INTRAVENOUS | Status: AC
  Filled 2014-07-29: qty 20

## 2014-07-29 MED ORDER — PHENYLEPHRINE HCL 10 MG/ML IJ SOLN
INTRAMUSCULAR | Status: DC | PRN
  Administered 2014-07-29: 80 ug via INTRAVENOUS
  Administered 2014-07-29 (×2): 40 ug via INTRAVENOUS

## 2014-07-29 MED ORDER — FENTANYL CITRATE 0.05 MG/ML IJ SOLN
INTRAMUSCULAR | Status: DC | PRN
  Administered 2014-07-29 (×2): 50 ug via INTRAVENOUS
  Administered 2014-07-29: 100 ug via INTRAVENOUS

## 2014-07-29 MED ORDER — MIDAZOLAM HCL 5 MG/5ML IJ SOLN
INTRAMUSCULAR | Status: DC | PRN
  Administered 2014-07-29: 2 mg via INTRAVENOUS

## 2014-07-29 MED ORDER — MIRTAZAPINE 30 MG PO TBDP: 30.0000 mg | ORAL_TABLET | Freq: Every day | ORAL | Status: DC

## 2014-07-29 MED ORDER — PROPOFOL 10 MG/ML IV BOLUS
INTRAVENOUS | Status: DC | PRN
Start: 2014-07-29 — End: 2014-07-29
  Administered 2014-07-29: 180 mg via INTRAVENOUS

## 2014-07-29 MED ORDER — 0.9 % SODIUM CHLORIDE (POUR BTL) OPTIME
TOPICAL | Status: DC | PRN
  Administered 2014-07-29: 1000 mL

## 2014-07-29 MED ORDER — PROMETHAZINE HCL 25 MG/ML IJ SOLN: 6.2500 mg | INTRAMUSCULAR | Status: DC | PRN

## 2014-07-29 MED ORDER — HEMOSTATIC AGENTS (NO CHARGE) OPTIME
TOPICAL | Status: DC | PRN
  Administered 2014-07-29: 1 via TOPICAL

## 2014-07-29 MED ORDER — FENTANYL CITRATE 0.05 MG/ML IJ SOLN
INTRAMUSCULAR | Status: AC
  Filled 2014-07-29: qty 5

## 2014-07-29 MED ORDER — LIDOCAINE-EPINEPHRINE 2 %-1:100000 IJ SOLN
INTRAMUSCULAR | Status: DC | PRN
  Administered 2014-07-29: 3.4 mL

## 2014-07-29 MED ORDER — BUPIVACAINE-EPINEPHRINE 0.5% -1:200000 IJ SOLN
INTRAMUSCULAR | Status: DC | PRN
  Administered 2014-07-29: 3.6 mL

## 2014-07-29 MED ORDER — OXYMETAZOLINE HCL 0.05 % NA SOLN
NASAL | Status: DC | PRN
  Administered 2014-07-29: 1 via NASAL

## 2014-07-29 SURGICAL SUPPLY — 33 items
ALCOHOL 70% 16 OZ (MISCELLANEOUS) ×2 IMPLANT
ATTRACTOMAT 16X20 MAGNETIC DRP (DRAPES) ×2 IMPLANT
BLADE SURG 15 STRL LF DISP TIS (BLADE) ×2 IMPLANT
BLADE SURG 15 STRL SS (BLADE) ×2
COVER SURGICAL LIGHT HANDLE (MISCELLANEOUS) ×2 IMPLANT
GAUZE PACKING FOLDED 2  STR (GAUZE/BANDAGES/DRESSINGS) ×1
GAUZE PACKING FOLDED 2 STR (GAUZE/BANDAGES/DRESSINGS) ×1 IMPLANT
GAUZE SPONGE 4X4 16PLY XRAY LF (GAUZE/BANDAGES/DRESSINGS) ×2 IMPLANT
GLOVE BIOGEL PI IND STRL 6 (GLOVE) ×1 IMPLANT
GLOVE BIOGEL PI INDICATOR 6 (GLOVE) ×1
GLOVE SURG ORTHO 8.0 STRL STRW (GLOVE) ×2 IMPLANT
GLOVE SURG SS PI 6.0 STRL IVOR (GLOVE) ×2 IMPLANT
GOWN STRL REUS W/ TWL LRG LVL3 (GOWN DISPOSABLE) ×1 IMPLANT
GOWN STRL REUS W/TWL 2XL LVL3 (GOWN DISPOSABLE) ×2 IMPLANT
GOWN STRL REUS W/TWL LRG LVL3 (GOWN DISPOSABLE) ×1
HEMOSTAT SURGICEL 2X14 (HEMOSTASIS) ×2 IMPLANT
KIT BASIN OR (CUSTOM PROCEDURE TRAY) ×2 IMPLANT
KIT ROOM TURNOVER OR (KITS) ×2 IMPLANT
MANIFOLD NEPTUNE WASTE (CANNULA) ×2 IMPLANT
NEEDLE BLUNT 16X1.5 OR ONLY (NEEDLE) ×2 IMPLANT
NS IRRIG 1000ML POUR BTL (IV SOLUTION) ×2 IMPLANT
PACK EENT II TURBAN DRAPE (CUSTOM PROCEDURE TRAY) ×2 IMPLANT
PAD ARMBOARD 7.5X6 YLW CONV (MISCELLANEOUS) ×2 IMPLANT
SPONGE GAUZE 4X4 12PLY STER LF (GAUZE/BANDAGES/DRESSINGS) ×2 IMPLANT
SPONGE SURGIFOAM ABS GEL 100 (HEMOSTASIS) ×2 IMPLANT
SPONGE SURGIFOAM ABS GEL 12-7 (HEMOSTASIS) IMPLANT
SPONGE SURGIFOAM ABS GEL SZ50 (HEMOSTASIS) IMPLANT
SUCTION FRAZIER TIP 10 FR DISP (SUCTIONS) ×2 IMPLANT
SUT CHROMIC 3 0 PS 2 (SUTURE) ×4 IMPLANT
SYR 50ML SLIP (SYRINGE) ×2 IMPLANT
TOWEL OR 17X26 10 PK STRL BLUE (TOWEL DISPOSABLE) ×2 IMPLANT
TUBE CONNECTING 12X1/4 (SUCTIONS) ×2 IMPLANT
YANKAUER SUCT BULB TIP NO VENT (SUCTIONS) ×2 IMPLANT

## 2014-07-29 NOTE — Anesthesia Postprocedure Evaluation (Signed)
  Anesthesia Post-op Note  Patient: Carl Carey  Procedure(s) Performed: Procedure(s) (LRB): Extraction of tooth #'s [phone] with alveoloplasty and gross debridement of teeth (N/A)  Patient Location: PACU  Anesthesia Type: General  Level of Consciousness: awake and alert   Airway and Oxygen Therapy: Patient Spontanous Breathing  Post-op Pain: mild  Post-op Assessment: Post-op Vital signs reviewed, Patient's Cardiovascular Status Stable, Respiratory Function Stable, Patent Airway and No signs of Nausea or vomiting  Last Vitals:  Filed Vitals:   07/29/14 0930  BP: 114/74  Pulse: 81  Temp:   Resp: 13    Post-op Vital Signs: stable   Complications: No apparent anesthesia complications

## 2014-07-29 NOTE — Care Management Note (Signed)
CARE MANAGEMENT NOTE 07/29/2014  Patient:  Carl Carey, Carl Carey   Account Number:  [id number]  Date Initiated:  07/29/2014  Documentation initiated by:  Marquasia Schmieder  Subjective/Objective Assessment:   CM following for progression and d/c planning.     Action/Plan:   07/25/2014 Met with pt and mother re d/c needs, mother states that pt will d/c to her home, plan for tooth extraction at the hospital. [place] explained Medicaid and Disability application application.   Anticipated DC Date:  07/29/2014   Anticipated DC Plan:  [facility]      Choice offered to / List presented to:             Status of service:  Completed, signed off Medicare Important Message given?   (If response is "NO", the following Medicare IM given date fields will be blank) Date Medicare IM given:   Medicare IM given by:   Date Additional Medicare IM given:   Additional Medicare IM given by:    Discharge Disposition:  HOME/SELF CARE  Per UR Regulation:    If discussed at Long Length of Stay Meetings, dates discussed:    Comments:  07/29/2014 Pt for d/c today, multiple extractions today and has a followup appointment at that dentist office. Pt mother given IM letter to assist with prescriptions. Pt and mother reminded to apply for Medicaid and disability for this pt after d/c. Info on [facility] given to pt mother so that an appointment may be arranged for pt to have medical followup, no acute medical problems at this time. Pt has also been setup for psy followup. CRoyal RN MPH.

## 2014-07-29 NOTE — Progress Notes (Signed)
PRE-OPERATIVE NOTE:  07/29/2014   Carl Carey [id number]  VITALS: BP 110/72 mmHg  Pulse 91  Temp(Src) 97.7 F (36.5 C) (Oral)  Resp 18  Ht 5' 8"  (1.727 m)  Wt 136 lb 9.6 oz (61.961 kg)  BMI 20.77 kg/m2  SpO2 99%  Lab Results  Component Value Date   WBC 4.6 07/24/2014   HGB 10.7* 07/24/2014   HCT 34.5* 07/24/2014   MCV 75.2* 07/24/2014   PLT 334 07/24/2014   BMET    Component Value Date/Time   NA 138 07/24/2014 0447   K 3.6 07/24/2014 0447   CL 105 07/24/2014 0447   CO2 29 07/24/2014 0447   GLUCOSE 73 07/24/2014 0447   BUN <5* 07/24/2014 0447   CREATININE 0.82 07/24/2014 0447   CALCIUM 8.1* 07/24/2014 0447   GFRNONAA >90 07/24/2014 0447   GFRAA >90 07/24/2014 0447    No results found for: INR, PROTIME No results found for: PTT   Carl Carey presents for multiple dental extractions with alveoloplasty and gross debridement of remaining dentition in the operating room with general anesthesia. Patient to have tooth numbers 1, 15, 16, 18 extracted with alveoloplasty with consideration for extraction of tooth #17 as indicated. Patient will also have gross debridement of remaining dentition as patient condition allows.   SUBJECTIVE: The patient denies any acute medical or dental changes and agrees to proceed with treatment as planned.  EXAM: No sign of acute dental changes. Patient is more alert today and is able to open to 30 mm plus this morning.  ASSESSMENT: Patient is affected by  chronic apical periodontitis, multiple retained root segments, dental caries, chronic periodontitis, and accretions.  PLAN: Patient agrees to proceed with treatment as planned in the operating room as previously discussed and accepts the risks, benefits, and complications of the proposed treatment. Patient is aware of the risk for bleeding, bruising, swelling, infection, pain, nerve damage,  soft tissue damage, damage to adjacent teeth, sinus involvement, root tip fracture, mandible  fracture, and the risks of complications associated with the anesthesia. Patient also is aware of the potential for other complications not mentioned above.   Lenn Cal, DDS

## 2014-07-29 NOTE — Transfer of Care (Signed)
Immediate Anesthesia Transfer of Care Note  Patient: Carl Carey  Procedure(s) Performed: Procedure(s): Extraction of tooth #'s [phone] with alveoloplasty and gross debridement of teeth (N/A)  Patient Location: PACU  Anesthesia Type:General  Level of Consciousness: awake, alert  and oriented  Airway & Oxygen Therapy: Patient connected to face mask oxygen  Post-op Assessment: Report given to RN  Post vital signs: stable  Last Vitals:  Filed Vitals:   07/29/14 0426  BP: 110/72  Pulse: 91  Temp: 36.5 C  Resp: 18    Complications: No apparent anesthesia complications

## 2014-07-29 NOTE — Anesthesia Preprocedure Evaluation (Addendum)
Anesthesia Evaluation  Patient identified by MRN, date of birth, ID band Patient awake    Reviewed: Allergy & Precautions, NPO status , Patient's Chart, lab work & pertinent test results  Airway Mallampati: II  TM Distance: >3 FB Neck ROM: Full  Mouth opening: Limited Mouth Opening  Dental no notable dental hx. (+) Dental Advisory Given, Missing, Poor Dentition   Pulmonary neg pulmonary ROS,  breath sounds clear to auscultation  Pulmonary exam normal       Cardiovascular negative cardio ROS  Rhythm:Regular Rate:Normal     Neuro/Psych negative neurological ROS  negative psych ROS   GI/Hepatic negative GI ROS, Neg liver ROS,   Endo/Other  negative endocrine ROS  Renal/GU negative Renal ROS  negative genitourinary   Musculoskeletal negative musculoskeletal ROS (+)   Abdominal (+)  Abdomen: soft. Bowel sounds: normal.  Peds negative pediatric ROS (+)  Hematology  (+) anemia ,   Anesthesia Other Findings   Reproductive/Obstetrics negative OB ROS                            Anesthesia Physical Anesthesia Plan  ASA: II  Anesthesia Plan: General   Post-op Pain Management:    Induction: Intravenous  Airway Management Planned: Nasal ETT  Additional Equipment:   Intra-op Plan:   Post-operative Plan: Extubation in OR  Informed Consent: I have reviewed the patients History and Physical, chart, labs and discussed the procedure including the risks, benefits and alternatives for the proposed anesthesia with the patient or authorized representative who has indicated his/her understanding and acceptance.   Dental advisory given  Plan Discussed with: CRNA and Surgeon  Anesthesia Plan Comments:         Anesthesia Quick Evaluation

## 2014-07-29 NOTE — Progress Notes (Signed)
SLP Cancellation Note  Patient Details Name: Kartier Bennison MRN: [id number] DOB: 04-05-1981   Cancelled treatment:       Reason Eval/Treat Not Completed: Patient not medically ready. Multiple tooth extractions this a.m. Will reattempt when appropriate.   Kern Reap, MA, CCC-SLP 07/29/2014, 3:08 PM

## 2014-07-29 NOTE — Anesthesia Procedure Notes (Signed)
Procedure Name: Intubation Date/Time: 07/29/2014 7:29 AM Performed by: Maeola Harman Pre-anesthesia Checklist: Patient identified, Emergency Drugs available, Suction available, Patient being monitored and Timeout performed Patient Re-evaluated:Patient Re-evaluated prior to inductionOxygen Delivery Method: Circle system utilized Preoxygenation: Pre-oxygenation with 100% oxygen Intubation Type: IV induction Ventilation: Mask ventilation without difficulty Laryngoscope Size: Mac and 4 Grade View: Grade I Nasal Tubes: Nasal Rae and Left Tube size: 7.0 mm Number of attempts: 1 Placement Confirmation: ETT inserted through vocal cords under direct vision,  positive ETCO2 and breath sounds checked- equal and bilateral Tube secured with: Tape Dental Injury: Teeth and Oropharynx as per pre-operative assessment  Comments: Easy atraumatic induction and intubation with nasal RAE to left nare.  Dilated with nasal trumpet 6.5 and nasal prep with afrin.  ETT passed easily without magill forceps.  No trauma to nasopharynx noted.  Dr. Kalman Shan verified placement.  Carl Session, CRNA

## 2014-07-29 NOTE — Consult Note (Signed)
Psychiatry Consult follow up  Reason for Consult:  Depression, anxiety Referring Physician:  Dr. Eliseo Squires Patient Identification: Carl Carey MRN:  [id number] Principal Diagnosis: Dysphagia Diagnosis:   Patient Active Problem List   Diagnosis Date Noted  . Fever in adult [R50.9] 07/25/2014  . Protein-calorie malnutrition, severe [E43] 07/23/2014  . Parkinsonian features [R25.9] 07/21/2014  . Dysphagia [R13.10] 07/21/2014  . Microcytic anemia [D50.9] 07/21/2014    Total Time spent with patient: 20 minutes  Subjective:   Carl Carey is a 34 y.o. male patient admitted with generalized weakness, dysphagia and depression, anxiety, suicidal thoughts and behaviors.  HPI:  Carl Carey is an 34 y.o. male seen face-to-face for psychiatric evaluation for depression, anxiety, passive suicidal ideation and old behaviors. Reportedly patient has no previous acute psychiatric hospitalization or treatment. Patient was seen with his mother and a cousin in his room. Patient reported feeling depressed, sad, anxious, worried, weak, not able to walk and not able to swallow solid food. Reportedly he has been drinking fluids over 6-7 weeks. Patient mother and cousin reported that patient has been drifting away from home for the last 7 years and going to different cities like [place], [place] and [place] and living as a homeless. Reportedly he was sometimes stayed in homeless shelter, taking shower once a week and eat food wherever he can get. Patient also reported panhandling money to move around different cities. 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 Patient reported feeling anxiety about not able to swallow food. Patient stated having the suicidal thoughts sometimes but no intention or plans. Patient has no evidence of auditory/visual hallucinations, delusions or paranoia during this evaluation.  Patient denied history of drinking alcohol and abusing illicit drugs.  July 29, 2014  Interval History: Patient has complaints pain on his face secondary to dental procedure. Patient continued to report feeling weak and tired but able to talk better and able to walk with the support. Patient mother is at bedside reported patient will be discharged home soon if not today. Patient mom agrees with that patient has been slowly improving clinically. Patient has denied current symptoms of depression, anxiety, suicidal/homicidal ideation, intention or plans. Patient has no evidence of psychosis. Patient contract for safety at the time of the evaluation.  Medical history: Patient with a very confusing history. 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 The last the family has seen the patient was about 6-7 years ago. They got a call at the end of the week from [facility] asking them to pick him up. They brought him home where he has been unable to eat because he says he can not swallow. His mother reports that he has been drinking tea. He walks slowly. His speech is slurred and he stutters and can not get a lot of his words out intelligibly. He has been drooling. He reports that he has been this way for about 6 weeks but was able to get on a Grey[employer] bus from [place] to [place] about 2 weeks ago. He was prescribed Reglan and Protonix at [place] [facility]. His mother has been administering twice the prescribed dose. He reports he has been on no medications prior to this. He does not report any exposures. He has been living on the street. When they last saw the  patient 6-7 years ago he was normal.   Past Medical History:  Past Medical History  Diagnosis Date  . Panic attack     Past Surgical History  Procedure Laterality Date  . Neck surgery     Family History:  Family History  Problem Relation Age of Onset  . Hypertension Maternal  Grandmother    Social History:  History  Alcohol Use No     History  Drug Use No    History   Social History  . Marital Status: Single    Spouse Name: N/A  . Number of Children: N/A  . Years of Education: N/A   Social History Main Topics  . Smoking status: Never Smoker   . Smokeless tobacco: Not on file  . Alcohol Use: No  . Drug Use: No  . Sexual Activity: Not on file   Other Topics Concern  . None   Social History Narrative   Additional Social History:                          Allergies:  No Known Allergies  Labs:  Results for orders placed or performed during the hospital encounter of 07/21/14 (from the past 48 hour(s))  Surgical pcr screen     Status: Abnormal   Collection Time: 07/29/14  6:16 AM  Result Value Ref Range   MRSA, PCR NEGATIVE NEGATIVE   Staphylococcus aureus POSITIVE (A) NEGATIVE    Comment:        The Xpert SA Assay (FDA approved for NASAL specimens in patients over 25 years of age), is one component of a comprehensive surveillance program.  Test performance has been validated by [facility] for patients greater than or equal to 60 year old. It is not intended to diagnose infection nor to guide or monitor treatment.     Vitals: Blood pressure 112/64, pulse 92, temperature 97.8 F (36.6 C), temperature source Axillary, resp. rate 18, height 5' 8"  (1.727 m), weight 61.961 kg (136 lb 9.6 oz), SpO2 100 %.  Risk to Self: Is patient at risk for suicide?: No Risk to Others:   Prior Inpatient Therapy:   Prior Outpatient Therapy:    Current Facility-Administered Medications  Medication Dose Route Frequency Provider Last Rate Last Dose  . acetaminophen (TYLENOL) tablet 650 mg  650 mg Oral Q6H PRN Rise Patience, MD   650 mg at 07/25/14 2110   Or  . acetaminophen (TYLENOL) suppository 650 mg  650 mg Rectal Q6H PRN Rise Patience, MD      . feeding supplement (ENSURE ENLIVE) (ENSURE ENLIVE) liquid 237 mL  237 mL Oral TID  BM Kallie Locks, RD   237 mL at 07/28/14 1422  . lactated ringers infusion   Intravenous Continuous Lenn Cal, DDS 10 mL/hr at 07/29/14 1000    . mirtazapine (REMERON SOL-TAB) disintegrating tablet 30 mg  30 mg Oral QHS Ambrose Finland, MD   30 mg at 07/28/14 2203  . ondansetron (ZOFRAN) tablet 4 mg  4 mg Oral Q6H PRN Rise Patience, MD       Or  . ondansetron [facility]) injection 4 mg  4 mg Intravenous Q6H PRN Rise Patience, MD      . oxyCODONE-acetaminophen (PERCOCET/ROXICET) 5-325 MG per tablet 1-2 tablet  1-2 tablet Oral Q6H PRN Lenn Cal, DDS      . pantoprazole (PROTONIX) EC tablet 40 mg  40 mg Oral Daily Delfina Redwood, MD  40 mg at 07/28/14 0945  . polyvinyl alcohol (LIQUIFILM TEARS) 1.4 % ophthalmic solution 1 drop  1 drop Both Eyes PRN Geradine Girt, DO      . promethazine (PHENERGAN) injection 6.25-12.5 mg  6.25-12.5 mg Intravenous Q15 min PRN Myrtie Soman, MD        Musculoskeletal: Strength & Muscle Tone: decreased Gait & Station: unable to stand Patient leans: N/A  Psychiatric Specialty Exam: Physical Exam  ROS  Blood pressure 112/64, pulse 92, temperature 97.8 F (36.6 C), temperature source Axillary, resp. rate 18, height 5' 8"  (1.727 m), weight 61.961 kg (136 lb 9.6 oz), SpO2 100 %.Body mass index is 20.77 kg/(m^2).  General Appearance: Guarded  Eye Contact::  Fair  Speech:  Slurred and stuttering  Volume:  Decreased  Mood:  Anxious and Depressed  Affect:  Constricted and Depressed  Thought Process:  Coherent and Goal Directed  Orientation:  Full (Time, Place, and Person)  Thought Content:  WDL  Suicidal Thoughts:  No  Homicidal Thoughts:  No  Memory:  Immediate;   Fair Recent;   Fair  Judgement:  Impaired  Insight:  Shallow  Psychomotor Activity:  Decreased  Concentration:  Fair  Recall:  Good  Fund of Knowledge:Good  Language: Good  Akathisia:  NA  Handed:  Right  AIMS (if indicated):     Assets:  Communication  Skills Desire for Improvement Housing Intimacy Leisure Time Resilience Social Support  ADL's:  Impaired  Cognition: Impaired,  Mild  Sleep:      Medical Decision Making: New problem, with additional work up planned, Review of Psycho-Social Stressors (1), Review or order clinical lab tests (1), Review or order medicine tests (1), Review of Medication Regimen & Side Effects (2) and Review of New Medication or Change in Dosage (2)  Treatment Plan Summary: Daily contact with patient to assess and evaluate symptoms and progress in treatment and Medication management  Plan: Continue Remeron soluble tablet 30 mg at bedtime for depression, insomnia and poor appetite Patient does not meet criteria for psychiatric inpatient admission. Supportive therapy provided about ongoing stressors. Appreciate psychiatric consultation and follow up as clinically required Please contact [phone] or [phone] if needs further assistance  Disposition: Patient may be discharged to home with the outpatient psychiatric services and medically stable.  Jackie Russman,JANARDHAHA R. 07/29/2014 3:35 PM

## 2014-07-29 NOTE — Op Note (Signed)
OPERATIVE REPORT  Patient:            Carl Carey Date of Birth:  April 13, 1981 MRN:                [id number]   DATE OF PROCEDURE:  07/29/2014  PREOPERATIVE DIAGNOSES: 1.  Chronic apical periodontitis 2.  Multiple retained root segments 3. Chronic periodontitis 4. Accretions 5. Dental caries  POSTOPERATIVE DIAGNOSES: 1.  Chronic apical periodontitis 2.  Multiple retained root segments 3. Chronic periodontitis 4. Accretions 5. Dental caries  OPERATIONS: 1. Multiple extraction of tooth numbers 1, 15, 16, 17, and 18. 2. 3 Quadrants of alveoloplasty 3. Gross debridement of remaining dentition   SURGEON: Lenn Cal, DDS  ASSISTANT: Camie Patience, (dental assistant)  ANESTHESIA: General anesthesia via nasoendotracheal tube.  MEDICATIONS: 1. Ancef 2 g IV prior to invasive dental procedures. 2. Local anesthesia with a total utilization of 4 carpules each containing 34 mg of lidocaine with 0.017 mg of epinephrine as well as 2 carpules each containing 9 mg of bupivacaine with 0.009 mg of epinephrine.  SPECIMENS: There are 5 teeth that were discarded.  DRAINS: None  CULTURES: None  COMPLICATIONS: None   ESTIMATED BLOOD LOSS: 25 mLs.  INTRAVENOUS FLUIDS: Lactated ringers solution per the anesthesia team record  INDICATIONS: The patient was recently diagnosed with dysphagia.  A dental consultation was then requested to evaluate dentition due to periapical pathology noted on CT scan.  The patient was examined and treatment planned for multiple extractions with alveoloplasty and gross debridement of remaining dentition.  This treatment plan was formulated to decrease the risks and complications associated with dental infection from  affecting the patient's systemic health.  OPERATIVE FINDINGS: Patient was examined in operating room number 12.  The teeth were identified for extraction. Tooth numbers 1, 15, 16, 17, and 18 were to be extracted. The patient was noted be  affected by chronic apical periodontitis, multiple retained root segments, dental caries, chronic periodontitis, and accretions.   DESCRIPTION OF PROCEDURE: Patient was brought to the main operating room number 12. Patient was then placed in the supine position on the operating table. General anesthesia was then induced per the anesthesia team. The patient was then prepped and draped in the usual manner for dental medicine procedure. A timeout was performed. The patient was identified and procedures were verified. A throat pack was placed at this time. The oral cavity was then thoroughly examined with the findings noted above. The patient was then ready for dental medicine procedure as follows:  Local anesthesia was then administered sequentially with a total utilization of 4 carpules each containing 34 mg of lidocaine with 0.017 mg of epinephrine as well as 2 carpules  each containing 9 mg bupivacaine with 0.009 mg of epinephrine.  The Maxillary left and right quadrants were first approached. Anesthesia was then delivered utilizing infiltration with lidocaine with epinephrine. A #15 blade incision was then made from the maxillary left tuberosity and extended to the mesial of #14.  A  surgical flap was then carefully reflected. Appropriate amounts of buccal and interseptal bone were then removed utilizing a surgical handpiece and bur and copious amounts of sterile water.  The teeth were then subluxated with a series of straight elevators. Tooth numbers 15 and 16 were then removed with a 150 forceps without complications. Alveoloplasty was then performed utilizing a ronguers and bone file. The surgical site was then irrigated with copious amounts of sterile saline. The tissues were approximated and trimmed appropriately.  A piece of Surgifoam was placed in the extraction sockets appropriately. The surgical site was then closed from the maxillary left tuberosity and extended to the distal of #14 utilizing 3-0  chromic gut suture in a continuous interrupted suture technique 1. One interproximal suture was placed between tooth numbers 13 and 14 utilizing 3-0 chromic gut material.  The maxillary right quadrant was then approached. A 15 blade incision was made around tooth #16. A surgical flap was then reflected. The retained roots were then elevated out with a series of cryers elevators without complication.  Alveoloplasty some performed utilizing a rongeur and bone file. The surgical site was then irrigated with copious of sterile saline. A piece of Surgifoam was placed in the extraction socket appropriately. The surgical site was then closed utilizing a figure-of-eight suture technique and 3-0 chromic gut material.  At this point time the mandibular left quadrant was approached. Patient was given an inferior alveolar nerve block and long buccal nerve block with bupivacaine with epinephrine. Further infiltration was achieved with lidocaine with epinephrine.  A 15 blade incision was made from the distal of #17 and extended to the mesial of #19. A surgical flap was then carefully reflected. Appropriate amounts of buccal and interseptal bone were then removed utilizing a surgical handpiece and copious amount of sterile water around tooth numbers 17 and 18. Tooth numbers  17 and 18 were then subluxated with a series of straight elevators. Tooth number 17 was then removed with a 17 forceps without combination. The coronal aspect of tooth #18 was then removed with a 23 forceps. The roots were then sectioned utilizing a surgical handpiece and bur and copious amounts of sterile water. The roots were then elevated out utilizing a series of cryers elevators. Alveoloplasty was then performed utilizing a rongeurs and bone file. The tissues were approximated and trimmed appropriately. The surgical sites were then irrigated with copious amounts of sterile saline.  A piece of Surgifoam was then placed in the extraction sockets  appropriately. The mandibular left surgical site was then closed from the distal of  17 and extended to the mesial of #19 utilizing 3-0 chromic gut suture in a continuous interrupted suture technique 1.  An interproximal suture was then placed between tooth numbers 19 and 20 to further close the surgical site.   The remaining dentition was then approached. A sonic scaler was used to remove significant accretions. A series of hand curettes were then utilized to further remove accretions. A sonic scaler was then again used to further refine removal of accretions.   At this point time, the entire mouth was irrigated with copious amounts of sterile saline. The patient was examined for complications, seeing none, the dental medicine procedure was deemed to be complete. The throat pack was removed at this time. An oral airway was then placed at the request of the anesthesia team. A series of 4 x 4 gauze were placed in the mouth to aid hemostasis. The patient was then handed over to the anesthesia team for final disposition. After an appropriate amount of time, the patient was extubated and taken to the postanesthsia care unit  in good condition. All counts were correct for the dental medicine procedure.  The patient is return to dental medicine for evaluation for suture removal in 7-10 days. Patient is acceptable for discharge from a dental standpoint.   Lenn Cal, DDS.

## 2014-07-29 NOTE — OR Nursing (Signed)
Packing removed [id number].
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
## 2014-07-29 NOTE — Discharge Instructions (Signed)

## 2014-07-30 ENCOUNTER — Encounter (HOSPITAL_COMMUNITY): Payer: Self-pay | Admitting: Dentistry

## 2014-07-31 LAB — CULTURE, BLOOD (ROUTINE X 2)
Culture: NO GROWTH
Culture: NO GROWTH

## 2014-08-02 ENCOUNTER — Telehealth (HOSPITAL_COMMUNITY): Payer: Self-pay

## 2014-08-02 NOTE — Telephone Encounter (Signed)
08/02/2014                  Multiple calls and messages left for patient to call Dental Medicine to schedule F/U appt. w/Dr. Tommie Raymond.   No response from patient or contacts listed.  LRI

## 2014-08-06 ENCOUNTER — Ambulatory Visit (HOSPITAL_COMMUNITY): Payer: Self-pay | Admitting: Dentistry

## 2014-08-06 ENCOUNTER — Encounter (HOSPITAL_COMMUNITY): Payer: Self-pay | Admitting: Dentistry

## 2014-08-06 VITALS — BP 118/76 | HR 80 | Temp 98.2°F

## 2014-08-06 DIAGNOSIS — K08409 Partial loss of teeth, unspecified cause, unspecified class: Secondary | ICD-10-CM

## 2014-08-06 DIAGNOSIS — K036 Deposits [accretions] on teeth: Secondary | ICD-10-CM

## 2014-08-06 NOTE — Progress Notes (Signed)
POST OPERATIVE NOTE:  08/06/2014   Carl Carey [id number]  VITALS: BP 118/76 mmHg  Pulse 80  Temp(Src) 98.2 F (36.8 C)  LABS:  Lab Results  Component Value Date   WBC 4.6 07/24/2014   HGB 10.7* 07/24/2014   HCT 34.5* 07/24/2014   MCV 75.2* 07/24/2014   PLT 334 07/24/2014   BMET    Component Value Date/Time   NA 138 07/24/2014 0447   K 3.6 07/24/2014 0447   CL 105 07/24/2014 0447   CO2 29 07/24/2014 0447   GLUCOSE 73 07/24/2014 0447   BUN <5* 07/24/2014 0447   CREATININE 0.82 07/24/2014 0447   CALCIUM 8.1* 07/24/2014 0447   GFRNONAA >90 07/24/2014 0447   GFRAA >90 07/24/2014 0447    No results found for: INR, PROTIME No results found for: PTT   Carl Carey is status post multiple extractions with alveoloplasty and gross debridement of remaining dentition. Patient now presents for evaluation of healing and suture removal.  SUBJECTIVE: Patient has minimal complaints. A few stitches remain.  EXAM: There is no sign of infection, heme, or ooze. Sutures are loosely intact. Plaque was noted and oral hygiene improvement was highly recommended.  PROCEDURE: The patient was given a chlorhexidine gluconate rinse for 30 seconds. Sutures were then removed without complication. Patient tolerated the procedure well.  ASSESSMENT: Post operative course is consistent with dental procedures performed in the OR.   PLAN: 1. Continue salt water rinses as needed to aid healing. 2. Follow-up with a primary dentist for exam, radiographs, and discussion of other dental treatment needs.   Lenn Cal, DDS

## 2014-08-06 NOTE — Patient Instructions (Signed)
Brush after meals and at bedtime.  Use salt water rinses as needed to aid healing. Follow-up with new primary dentist for exam, radiographs, and discussion of other dental treatment needs.

## 2014-11-17 ENCOUNTER — Emergency Department (HOSPITAL_COMMUNITY)
Admission: EM | Admit: 2014-11-17 | Discharge: 2014-11-17 | Disposition: A | Discharge status: Home / Self Care | Payer: Medicaid Other | Attending: Emergency Medicine | Admitting: Emergency Medicine

## 2014-11-17 ENCOUNTER — Encounter (HOSPITAL_COMMUNITY): Payer: Self-pay | Admitting: Emergency Medicine

## 2014-11-17 DIAGNOSIS — H539 Unspecified visual disturbance: Secondary | ICD-10-CM | POA: Diagnosis not present

## 2014-11-17 DIAGNOSIS — E739 Lactose intolerance, unspecified: Secondary | ICD-10-CM | POA: Insufficient documentation

## 2014-11-17 DIAGNOSIS — Z79899 Other long term (current) drug therapy: Secondary | ICD-10-CM | POA: Insufficient documentation

## 2014-11-17 DIAGNOSIS — F410 Panic disorder [episodic paroxysmal anxiety] without agoraphobia: Secondary | ICD-10-CM | POA: Diagnosis not present

## 2014-11-17 DIAGNOSIS — R109 Unspecified abdominal pain: Secondary | ICD-10-CM | POA: Diagnosis present

## 2014-11-17 LAB — CBC
HCT: 43.3 % (ref 39.0–52.0)
Hemoglobin: 13.8 g/dL (ref 13.0–17.0)
MCH: 25.2 pg — ABNORMAL LOW (ref 26.0–34.0)
MCHC: 31.9 g/dL (ref 30.0–36.0)
MCV: 79 fL (ref 78.0–100.0)
Platelets: 361 10*3/uL (ref 150–400)
RBC: 5.48 MIL/uL (ref 4.22–5.81)
RDW: 14.9 % (ref 11.5–15.5)
WBC: 7 10*3/uL (ref 4.0–10.5)

## 2014-11-17 LAB — URINALYSIS, ROUTINE W REFLEX MICROSCOPIC
Bilirubin Urine: NEGATIVE
Glucose, UA: NEGATIVE mg/dL
Hgb urine dipstick: NEGATIVE
Ketones, ur: NEGATIVE mg/dL
Leukocytes, UA: NEGATIVE
Nitrite: NEGATIVE
Protein, ur: NEGATIVE mg/dL
Specific Gravity, Urine: 1.031 — ABNORMAL HIGH (ref 1.005–1.030)
Urobilinogen, UA: 0.2 mg/dL (ref 0.0–1.0)
pH: 6 (ref 5.0–8.0)

## 2014-11-17 LAB — COMPREHENSIVE METABOLIC PANEL
ALT: 17 U/L (ref 17–63)
AST: 25 U/L (ref 15–41)
Albumin: 3.5 g/dL (ref 3.5–5.0)
Alkaline Phosphatase: 80 U/L (ref 38–126)
Anion gap: 5 (ref 5–15)
BUN: 7 mg/dL (ref 6–20)
CO2: 26 mmol/L (ref 22–32)
Calcium: 8.8 mg/dL — ABNORMAL LOW (ref 8.9–10.3)
Chloride: 110 mmol/L (ref 101–111)
Creatinine, Ser: 1.11 mg/dL (ref 0.61–1.24)
GFR calc Af Amer: >60 mL/min (ref >60)
GFR calc non Af Amer: >60 mL/min (ref >60)
Glucose, Bld: 90 mg/dL (ref 65–99)
Potassium: 3.9 mmol/L (ref 3.5–5.1)
Sodium: 141 mmol/L (ref 135–145)
Total Bilirubin: 0.6 mg/dL (ref 0.3–1.2)
Total Protein: 7.3 g/dL (ref 6.5–8.1)

## 2014-11-17 LAB — LIPASE, BLOOD: Lipase: 41 U/L (ref 22–51)

## 2014-11-17 MED ORDER — SODIUM CHLORIDE 0.9 % IV BOLUS (SEPSIS)
1000.0000 mL | Freq: Once | INTRAVENOUS | Status: AC
Start: 2014-11-17 — End: 2014-11-17
  Administered 2014-11-17: 1000 mL via INTRAVENOUS

## 2014-11-17 MED ORDER — DICYCLOMINE HCL 20 MG PO TABS: 20.0000 mg | ORAL_TABLET | Freq: Two times a day (BID) | ORAL | Status: DC

## 2014-11-17 NOTE — ED Provider Notes (Signed)
CSN: [id number]     Arrival date & time 11/17/14  1912 History   First MD Initiated Contact with Patient 11/17/14 1933     Chief Complaint  Patient presents with  . Diarrhea  . Abdominal Pain   HPI Patient presents to the emergency room with complaints of diarrhea. Patient has had these symptoms off and on for a year. Sometimes he has several loose stools per day. Patient notices that it often comes after he drinks milk. Eyes any trouble with vomiting. He has not had any nausea. He denies any fevers. He sometimes feels bloated on the right side of his abdomen. Past Medical History  Diagnosis Date  . Panic attack    Past Surgical History  Procedure Laterality Date  . Neck surgery    . Multiple extractions with alveoloplasty N/A 07/29/2014    Procedure: Extraction of tooth #'s 1,15,16,17,18 with alveoloplasty and gross debridement of teeth;  Surgeon: Lenn Cal, DDS;  Location: [facility];  Service: Oral Surgery;  Laterality: N/A;   Family History  Problem Relation Age of Onset  . Hypertension Maternal Grandmother    History  Substance Use Topics  . Smoking status: Never Smoker   . Smokeless tobacco: Not on file  . Alcohol Use: No    Review of Systems  Eyes: Positive for visual disturbance (vision feels blurry sometimes).  All other systems reviewed and are negative.     Allergies  Review of patient's allergies indicates no known allergies.  Home Medications   Prior to Admission medications   Medication Sig Start Date End Date Taking? Authorizing Provider  dicyclomine (BENTYL) 20 MG tablet Take 1 tablet (20 mg total) by mouth 2 (two) times daily. 11/17/14   Dorie Rank, MD  mirtazapine (REMERON SOL-TAB) 30 MG disintegrating tablet Take 1 tablet (30 mg total) by mouth at bedtime. Patient not taking: Reported on 11/17/2014 07/29/14   Delfina Redwood, MD  oxyCODONE-acetaminophen (PERCOCET/ROXICET) 5-325 MG per tablet Take 1-2 tablets by mouth every 6 (six) hours as needed for  moderate pain. Patient not taking: Reported on 11/17/2014 07/29/14   Delfina Redwood, MD   BP 109/64 mmHg  Pulse 77  Temp(Src) 98.6 F (37 C) (Oral)  Resp 18  SpO2 98% Physical Exam  Constitutional: He appears well-developed and well-nourished. No distress.  Disheveled, unkempt  HENT:  Head: Normocephalic and atraumatic.  Right Ear: External ear normal.  Left Ear: External ear normal.  Eyes: Conjunctivae are normal. Right eye exhibits no discharge. Left eye exhibits no discharge. No scleral icterus.  Neck: Neck supple. No tracheal deviation present.  Cardiovascular: Normal rate, regular rhythm and intact distal pulses.   Pulmonary/Chest: Effort normal and breath sounds normal. No stridor. No respiratory distress. He has no wheezes. He has no rales.  Abdominal: Soft. Bowel sounds are normal. He exhibits no distension. There is no tenderness. There is no rebound and no guarding.  Musculoskeletal: He exhibits no edema or tenderness.  Neurological: He is alert. He has normal strength. No cranial nerve deficit (no facial droop, extraocular movements intact, no slurred speech) or sensory deficit. He exhibits normal muscle tone. He displays no seizure activity. Coordination normal.  Skin: Skin is warm and dry. No rash noted.  Psychiatric: He has a normal mood and affect.  Nursing note and vitals reviewed.   ED Course  Procedures (including critical care time) Labs Review Labs Reviewed  COMPREHENSIVE METABOLIC PANEL - Abnormal; Notable for the following:    Calcium 8.8 (*)  All other components within normal limits  CBC - Abnormal; Notable for the following:    MCH 25.2 (*)    All other components within normal limits  URINALYSIS, ROUTINE W REFLEX MICROSCOPIC (NOT AT [facility]) - Abnormal; Notable for the following:    Specific Gravity, Urine 1.031 (*)    All other components within normal limits  LIPASE, BLOOD     MDM   Final diagnoses:  Lactose intolerance    Benign  abdominal exam.  Sx ongoing for an extended period of time.  Pt mentions noticing the symptoms when he drinks mild.  Will try having him decrease dairy/mild intake.  Outpatient follow up with PCP.  At this time there does not appear to be any evidence of an acute emergency medical condition and the patient appears stable for discharge with appropriate outpatient follow up.     Dorie Rank, MD 11/17/14 2134

## 2014-11-17 NOTE — ED Notes (Signed)
Ptstates that he has had diarrhea x 1 year and he has had R sided abdominal 'swelling'. Alert and oriented.

## 2014-11-17 NOTE — Discharge Instructions (Signed)
Lactose Intolerance, Adult Lactose intolerance is when the body is not able to digest lactose, a sugar found in milk and milk products. Lactose intolerance is caused by your body not producing enough of the enzyme lactase. When there is not enough lactase to digest the amount of lactose consumed, discomfort may be felt. Lactose intolerance is not a milk allergy. For most people, lactase deficiency is a condition that develops naturally over time. After about the age of 2, the body begins to produce less lactase. But many people may not experience symptoms until they are much older. CAUSES Things that can cause you to be lactose intolerant include:  Aging.  Being born without the ability to make lactase.  Certain digestive diseases.  Injuries to the small intestine. SYMPTOMS   Feeling sick to your stomach (nauseous).  Diarrhea.  Cramps.  Bloating.  Gas. Symptoms usually show up a half hour or 2 hours after eating or drinking products containing lactose. TREATMENT  No treatment can improve the body's ability to produce lactase. However, symptoms can be controlled through diet. A medicine may be given to you to take when you consume lactose-containing foods or drinks. The medicine contains the lactase enzyme, which help the body digest lactose better. HOME CARE INSTRUCTIONS  Eat or drink dairy products as told by your caregiver or dietician.  Take all medicine as directed by your caregiver.  Find lactose-free or lactose-reduced products at your local grocery store.  Talk to your caregiver or dietician to decide if you need any dietary supplements. The following is the amount of calcium needed from the diet:  19 to 50 years: 1000 mg  Over 50 years: 1200 mg Calcium and Lactose in Common Foods Non-Dairy Products / Calcium Content (mg)  Calcium-fortified orange juice, 1 cup / 308 to 344 mg  Sardines, with edible bones, 3 oz / 270 mg  Salmon, canned, with edible bones, 3 oz /  205 mg  Soymilk, fortified, 1 cup / 200 mg  Broccoli (raw), 1 cup / 90 mg  Orange, 1 medium / 50 mg  Pinto beans,  cup / 40 mg  Tuna, canned, 3 oz / 10 mg  Lettuce greens,  cup / 10 mg Dairy Products / Calcium Content (mg) / Lactose Content (g)  Yogurt, plain, low-fat, 1 cup / 415 mg / 5 g  Milk, reduced fat, 1 cup / 295 mg / 11 g  Swiss cheese, 1 oz / 270 mg / 1 g  Ice cream,  cup / 85 mg / 6 g  Cottage cheese,  cup / 75 mg / 2 to 3 g SEEK MEDICAL CARE IF: You have no relief from your symptoms. Document Released: 04/19/2005 Document Revised: 07/12/2011 Document Reviewed: 07/20/2013 [facility] Patient Information 2015 [place], [place]. This information is not intended to replace advice given to you by your health care provider. Make sure you discuss any questions you have with your health care provider.

## 2016-04-16 ENCOUNTER — Encounter (HOSPITAL_COMMUNITY): Payer: Self-pay | Admitting: Emergency Medicine

## 2016-04-16 ENCOUNTER — Emergency Department (HOSPITAL_COMMUNITY)
Admission: EM | Admit: 2016-04-16 | Discharge: 2016-04-17 | Disposition: A | Discharge status: Home / Self Care | Payer: Medicaid Other | Attending: Emergency Medicine | Admitting: Emergency Medicine

## 2016-04-16 DIAGNOSIS — R42 Dizziness and giddiness: Secondary | ICD-10-CM | POA: Diagnosis present

## 2016-04-16 DIAGNOSIS — E876 Hypokalemia: Secondary | ICD-10-CM | POA: Diagnosis not present

## 2016-04-16 DIAGNOSIS — R197 Diarrhea, unspecified: Secondary | ICD-10-CM | POA: Diagnosis not present

## 2016-04-16 DIAGNOSIS — Z79899 Other long term (current) drug therapy: Secondary | ICD-10-CM | POA: Diagnosis not present

## 2016-04-16 NOTE — ED Triage Notes (Signed)
Pt with multiple complaints- reports slurred speech, drooling, dizziness, R sided weakness, and frequent falls x 1 year.  Pt states he has been seen for same and told he had Parkinson's but states symptoms are getting worse.

## 2016-04-17 LAB — RAPID URINE DRUG SCREEN, HOSP PERFORMED
Amphetamines: NOT DETECTED
Barbiturates: NOT DETECTED
Benzodiazepines: NOT DETECTED
Cocaine: NOT DETECTED
Opiates: NOT DETECTED
Tetrahydrocannabinol: NOT DETECTED

## 2016-04-17 LAB — AMMONIA: Ammonia: 44 umol/L — ABNORMAL HIGH (ref 9–35)

## 2016-04-17 LAB — COMPREHENSIVE METABOLIC PANEL
ALT: 12 U/L — ABNORMAL LOW (ref 17–63)
AST: 23 U/L (ref 15–41)
Albumin: 2.6 g/dL — ABNORMAL LOW (ref 3.5–5.0)
Alkaline Phosphatase: 54 U/L (ref 38–126)
Anion gap: 6 (ref 5–15)
BUN: 8 mg/dL (ref 6–20)
CO2: 27 mmol/L (ref 22–32)
Calcium: 8.3 mg/dL — ABNORMAL LOW (ref 8.9–10.3)
Chloride: 105 mmol/L (ref 101–111)
Creatinine, Ser: 0.78 mg/dL (ref 0.61–1.24)
GFR calc Af Amer: >60 mL/min (ref >60)
GFR calc non Af Amer: >60 mL/min (ref >60)
Glucose, Bld: 94 mg/dL (ref 65–99)
Potassium: 2.9 mmol/L — ABNORMAL LOW (ref 3.5–5.1)
Sodium: 138 mmol/L (ref 135–145)
Total Bilirubin: 0.7 mg/dL (ref 0.3–1.2)
Total Protein: 5.1 g/dL — ABNORMAL LOW (ref 6.5–8.1)

## 2016-04-17 LAB — CBC WITH DIFFERENTIAL/PLATELET
Basophils Absolute: 0 10*3/uL (ref 0.0–0.1)
Basophils Relative: 0 %
Eosinophils Absolute: 0.2 10*3/uL (ref 0.0–0.7)
Eosinophils Relative: 3 %
HCT: 33.1 % — ABNORMAL LOW (ref 39.0–52.0)
Hemoglobin: 10.7 g/dL — ABNORMAL LOW (ref 13.0–17.0)
Lymphocytes Relative: 9 %
Lymphs Abs: 0.6 10*3/uL — ABNORMAL LOW (ref 0.7–4.0)
MCH: 24.9 pg — ABNORMAL LOW (ref 26.0–34.0)
MCHC: 32.3 g/dL (ref 30.0–36.0)
MCV: 77 fL — ABNORMAL LOW (ref 78.0–100.0)
Monocytes Absolute: 1.1 10*3/uL — ABNORMAL HIGH (ref 0.1–1.0)
Monocytes Relative: 17 %
Neutro Abs: 4.6 10*3/uL (ref 1.7–7.7)
Neutrophils Relative %: 71 %
Platelets: 327 10*3/uL (ref 150–400)
RBC: 4.3 MIL/uL (ref 4.22–5.81)
RDW: 14 % (ref 11.5–15.5)
WBC: 6.6 10*3/uL (ref 4.0–10.5)

## 2016-04-17 LAB — URINALYSIS, ROUTINE W REFLEX MICROSCOPIC
Bilirubin Urine: NEGATIVE
Glucose, UA: NEGATIVE mg/dL
Hgb urine dipstick: NEGATIVE
Ketones, ur: NEGATIVE mg/dL
Leukocytes, UA: NEGATIVE
Nitrite: NEGATIVE
Protein, ur: NEGATIVE mg/dL
Specific Gravity, Urine: 1.01 (ref 1.005–1.030)
pH: 7 (ref 5.0–8.0)

## 2016-04-17 LAB — ETHANOL: Alcohol, Ethyl (B): <5 mg/dL (ref <5)

## 2016-04-17 LAB — ACETAMINOPHEN LEVEL: Acetaminophen (Tylenol), Serum: <10 ug/mL — ABNORMAL LOW (ref 10–30)

## 2016-04-17 LAB — LIPASE, BLOOD: Lipase: 35 U/L (ref 11–51)

## 2016-04-17 LAB — SALICYLATE LEVEL: Salicylate Lvl: <7 mg/dL (ref 2.8–30.0)

## 2016-04-17 MED ORDER — DICYCLOMINE HCL 10 MG PO CAPS
10.0000 mg | ORAL_CAPSULE | Freq: Once | ORAL | Status: AC
  Administered 2016-04-17: 10 mg via ORAL
  Filled 2016-04-17: qty 1

## 2016-04-17 MED ORDER — DICYCLOMINE HCL 20 MG PO TABS: 20.0000 mg | ORAL_TABLET | Freq: Two times a day (BID) | ORAL | 0 refills | Status: DC

## 2016-04-17 MED ORDER — MIRTAZAPINE 30 MG PO TBDP: 30.0000 mg | ORAL_TABLET | Freq: Every day | ORAL | 1 refills | Status: DC

## 2016-04-17 MED ORDER — POTASSIUM CHLORIDE CRYS ER 20 MEQ PO TBCR
40.0000 meq | EXTENDED_RELEASE_TABLET | Freq: Once | ORAL | Status: AC
  Administered 2016-04-17: 40 meq via ORAL
  Filled 2016-04-17: qty 2

## 2016-04-17 NOTE — ED Provider Notes (Signed)
[facility] DEPT Provider Note   CSN: [id number] Arrival date & time: 04/16/16  2155   By signing my name below, I, Evelene Croon, attest that this documentation has been prepared under the direction and in the presence of Orpah Greek, MD . Electronically Signed: Evelene Croon, Scribe. 04/17/2016. 12:26 AM.  History   Chief Complaint Chief Complaint  Patient presents with  . Dizziness  . Diarrhea  . multiple complaints     The history is provided by the patient. No language interpreter was used.    HPI Comments:  Carl Carey is a 35 y.o. male who presents to the Emergency Department complaining of cramping abdominal pain,  intermittent x ~ 1 year. He has taken Bentyl in the past with relief but states he does not have anymore. He reports associated occasional diarrhea. No fever or vomiting.   Past Medical History:  Diagnosis Date  . Panic attack     Patient Active Problem List   Diagnosis Date Noted  . Fever in adult 07/25/2014  . Protein-calorie malnutrition, severe ([place]) 07/23/2014  . Parkinsonian features 07/21/2014  . Dysphagia 07/21/2014  . Microcytic anemia 07/21/2014    Past Surgical History:  Procedure Laterality Date  . MULTIPLE EXTRACTIONS WITH ALVEOLOPLASTY N/A 07/29/2014   Procedure: Extraction of tooth #'s 1,15,16,17,18 with alveoloplasty and gross debridement of teeth;  Surgeon: Lenn Cal, DDS;  Location: [facility];  Service: Oral Surgery;  Laterality: N/A;  . NECK SURGERY         Home Medications    Prior to Admission medications   Medication Sig Start Date End Date Taking? Authorizing Provider  dicyclomine (BENTYL) 20 MG tablet Take 1 tablet (20 mg total) by mouth 2 (two) times daily. 04/17/16   Orpah Greek, MD  mirtazapine (REMERON SOL-TAB) 30 MG disintegrating tablet Take 1 tablet (30 mg total) by mouth at bedtime. 04/17/16   Orpah Greek, MD  oxyCODONE-acetaminophen (PERCOCET/ROXICET) 5-325 MG per tablet  Take 1-2 tablets by mouth every 6 (six) hours as needed for moderate pain. Patient not taking: Reported on 04/17/2016 07/29/14   Delfina Redwood, MD    Family History Family History  Problem Relation Age of Onset  . Hypertension Maternal Grandmother     Social History Social History  Substance Use Topics  . Smoking status: Never Smoker  . Smokeless tobacco: Never Used  . Alcohol use No     Allergies   Patient has no known allergies.   Review of Systems Review of Systems  Constitutional: Negative for chills and fever.  Respiratory: Negative for shortness of breath.   Cardiovascular: Negative for chest pain.  Gastrointestinal: Positive for abdominal pain and diarrhea. Negative for vomiting.  All other systems reviewed and are negative.    Physical Exam Updated Vital Signs BP 122/77   Pulse 70   Temp 99.8 F (37.7 C) (Oral)   Resp 26   Ht 5' 8"  (1.727 m)   Wt 149 lb (67.6 kg)   SpO2 98%   BMI 22.66 kg/m   Physical Exam  Constitutional: He is oriented to person, place, and time. He appears well-developed and well-nourished. No distress.  HENT:  Head: Normocephalic and atraumatic.  Right Ear: Hearing normal.  Left Ear: Hearing normal.  Nose: Nose normal.  Mouth/Throat: Oropharynx is clear and moist and mucous membranes are normal.  Eyes: Conjunctivae and EOM are normal. Pupils are equal, round, and reactive to light.  Neck: Normal range of motion. Neck supple.  Cardiovascular:  Regular rhythm, S1 normal and S2 normal.  Exam reveals no gallop and no friction rub.   No murmur heard. Pulmonary/Chest: Effort normal and breath sounds normal. No respiratory distress. He exhibits no tenderness.  Abdominal: Soft. Normal appearance and bowel sounds are normal. There is no hepatosplenomegaly. There is no tenderness. There is no rebound, no guarding, no tenderness at McBurney's point and negative Murphy's sign. No hernia.  Musculoskeletal: Normal range of motion.    Neurological: He is alert and oriented to person, place, and time. He has normal strength. No cranial nerve deficit or sensory deficit. Coordination normal. GCS eye subscore is 4. GCS verbal subscore is 5. GCS motor subscore is 6.  Skin: Skin is warm, dry and intact. No rash noted. No cyanosis.  Psychiatric: His affect is not angry. His speech is rapid and/or pressured and tangential. He is hyperactive. He does not exhibit a depressed mood.  Nursing note and vitals reviewed.    ED Treatments / Results  DIAGNOSTIC STUDIES:  Oxygen Saturation is 98% on RA, normal by my interpretation.    COORDINATION OF CARE:  12:24 AM Discussed treatment plan with pt at bedside and pt agreed to plan.  Labs (all labs ordered are listed, but only abnormal results are displayed) Labs Reviewed  CBC WITH DIFFERENTIAL/PLATELET - Abnormal; Notable for the following:       Result Value   Hemoglobin 10.7 (*)    HCT 33.1 (*)    MCV 77.0 (*)    MCH 24.9 (*)    Lymphs Abs 0.6 (*)    Monocytes Absolute 1.1 (*)    All other components within normal limits  COMPREHENSIVE METABOLIC PANEL - Abnormal; Notable for the following:    Potassium 2.9 (*)    Calcium 8.3 (*)    Total Protein 5.1 (*)    Albumin 2.6 (*)    ALT 12 (*)    All other components within normal limits  AMMONIA - Abnormal; Notable for the following:    Ammonia 44 (*)    All other components within normal limits  ACETAMINOPHEN LEVEL - Abnormal; Notable for the following:    Acetaminophen (Tylenol), Serum <10 (*)    All other components within normal limits  URINALYSIS, ROUTINE W REFLEX MICROSCOPIC - Abnormal; Notable for the following:    Color, Urine STRAW (*)    All other components within normal limits  LIPASE, BLOOD  RAPID URINE DRUG SCREEN, HOSP PERFORMED  ETHANOL  SALICYLATE LEVEL    EKG  EKG Interpretation  Date/Time:  Saturday April 17 2016 01:46:34 EST Ventricular Rate:  69 PR Interval:    QRS Duration: 91 QT  Interval:  404 QTC Calculation: 433 R Axis:   71 Text Interpretation:  Sinus rhythm ST elev, probable normal early repol pattern Confirmed by Colton Engdahl  MD, Deiondra Denley ([id number]) on 04/17/2016 2:33:39 AM       Radiology No results found.  Procedures Procedures (including critical care time)  Medications Ordered in ED Medications  potassium chloride SA (K-DUR,KLOR-CON) CR tablet 40 mEq (40 mEq Oral Given 04/17/16 0200)  dicyclomine (BENTYL) capsule 10 mg (10 mg Oral Given 04/17/16 0200)     Initial Impression / Assessment and Plan / ED Course  I have reviewed the triage vital signs and the nursing notes.  Pertinent labs & imaging results that were available during my care of the patient were reviewed by me and considered in my medical decision making (see chart for details).  Clinical Course  Patient presents to the emergency department for evaluation of multiple complaints. Patient is disheveled, malodorous, appears to be homeless. He has multiple unconnected complaints. Patient exhibiting slight psychomotor agitation. He appears to be oriented. Breathing through his records reveals a previous hospitalization with similar behavior at which time he saw psychiatry. He was started on Remeron at that time. No diagnosis was given, however. Patient is not homicidal or suicidal. He denies hallucinations. I offered him psychiatric evaluation but he does not wish to see psychiatry. As he is not homicidal or suicidal, is not a candidate for involuntary commitment. Treat hypokalemia and give prescription for his medications.  Final Clinical Impressions(s) / ED Diagnoses   Final diagnoses:  Hypokalemia    New Prescriptions Current Discharge Medication List     I personally performed the services described in this documentation, which was scribed in my presence. The recorded information has been reviewed and is accurate.     Orpah Greek, MD 04/17/16 [phone]

## 2016-04-17 NOTE — ED Notes (Signed)
Pt departed in NAD.  

## 2017-12-10 ENCOUNTER — Other Ambulatory Visit: Payer: Self-pay

## 2017-12-10 ENCOUNTER — Emergency Department (HOSPITAL_COMMUNITY): Payer: Medicaid Other

## 2017-12-10 ENCOUNTER — Encounter (HOSPITAL_COMMUNITY): Payer: Self-pay | Admitting: Emergency Medicine

## 2017-12-10 ENCOUNTER — Emergency Department (HOSPITAL_COMMUNITY)
Admission: EM | Admit: 2017-12-10 | Discharge: 2017-12-11 | Disposition: A | Discharge status: Home / Self Care | Payer: Medicaid Other | Attending: Emergency Medicine | Admitting: Emergency Medicine

## 2017-12-10 DIAGNOSIS — F99 Mental disorder, not otherwise specified: Secondary | ICD-10-CM

## 2017-12-10 DIAGNOSIS — L03012 Cellulitis of left finger: Secondary | ICD-10-CM | POA: Diagnosis not present

## 2017-12-10 DIAGNOSIS — F609 Personality disorder, unspecified: Secondary | ICD-10-CM | POA: Diagnosis present

## 2017-12-10 DIAGNOSIS — F1010 Alcohol abuse, uncomplicated: Secondary | ICD-10-CM | POA: Insufficient documentation

## 2017-12-10 DIAGNOSIS — E46 Unspecified protein-calorie malnutrition: Secondary | ICD-10-CM

## 2017-12-10 DIAGNOSIS — R6889 Other general symptoms and signs: Secondary | ICD-10-CM

## 2017-12-10 DIAGNOSIS — R0602 Shortness of breath: Secondary | ICD-10-CM

## 2017-12-10 LAB — CBC
HCT: 37.4 % — ABNORMAL LOW (ref 39.0–52.0)
Hemoglobin: 11.1 g/dL — ABNORMAL LOW (ref 13.0–17.0)
MCH: 24.1 pg — ABNORMAL LOW (ref 26.0–34.0)
MCHC: 29.7 g/dL — ABNORMAL LOW (ref 30.0–36.0)
MCV: 81.3 fL (ref 78.0–100.0)
Platelets: 419 10*3/uL — ABNORMAL HIGH (ref 150–400)
RBC: 4.6 MIL/uL (ref 4.22–5.81)
RDW: 14.9 % (ref 11.5–15.5)
WBC: 8.6 10*3/uL (ref 4.0–10.5)

## 2017-12-10 MED ORDER — CEPHALEXIN 250 MG PO CAPS
500.0000 mg | ORAL_CAPSULE | Freq: Once | ORAL | Status: AC
  Administered 2017-12-10: 500 mg via ORAL
  Filled 2017-12-10: qty 2

## 2017-12-10 MED ORDER — ACETAMINOPHEN 325 MG PO TABS
650.0000 mg | ORAL_TABLET | Freq: Once | ORAL | Status: AC
  Administered 2017-12-10: 650 mg via ORAL
  Filled 2017-12-10: qty 2

## 2017-12-10 MED ORDER — SODIUM CHLORIDE 0.9 % IV BOLUS
1000.0000 mL | Freq: Once | INTRAVENOUS | Status: AC
  Administered 2017-12-10: 1000 mL via INTRAVENOUS

## 2017-12-10 MED ORDER — DICYCLOMINE HCL 10 MG PO CAPS
10.0000 mg | ORAL_CAPSULE | Freq: Once | ORAL | Status: AC
  Administered 2017-12-10: 10 mg via ORAL
  Filled 2017-12-10: qty 1

## 2017-12-10 NOTE — ED Notes (Signed)
Patient transported to X-ray 

## 2017-12-10 NOTE — ED Provider Notes (Signed)
[facility] Emergency Department Provider Note MRN:  [id number]  Arrival date & time: 12/11/17     Chief Complaint   multiple complaints   History of Present Illness   Carl Carey is a 37 y.o. year-old male with a history of anxiety presenting to the ED with chief complaint of multiple complaints.  Patient explains he has been experiencing several weeks of tooth pain causing poor p.o. intake.  States that he is lost about 10 pounds in the past several weeks because of this.  Endorsing occasional headache, shortness of breath, abdominal pain, pain and swelling of his left ring finger, weeks of diarrhea, burning with urination.  Endorsing pain of the right thigh.  His general discomfort is moderate in severity, constant.  No exacerbating or alleviating factors.  Gradual onset of symptoms.  Review of Systems  A complete 10 system review of systems was obtained and all systems are negative except as noted in the HPI and PMH.  Patient's Health History    Past Medical History:  Diagnosis Date  . Panic attack     Past Surgical History:  Procedure Laterality Date  . MULTIPLE EXTRACTIONS WITH ALVEOLOPLASTY N/A 07/29/2014   Procedure: Extraction of tooth #'s 1,15,16,17,18 with alveoloplasty and gross debridement of teeth;  Surgeon: Lenn Cal, DDS;  Location: [facility];  Service: Oral Surgery;  Laterality: N/A;  . NECK SURGERY      Family History  Problem Relation Age of Onset  . Hypertension Maternal Grandmother     Social History   Socioeconomic History  . Marital status: Single    Spouse name: Not on file  . Number of children: Not on file  . Years of education: Not on file  . Highest education level: Not on file  Occupational History  . Not on file  Social Needs  . Financial resource strain: Not on file  . Food insecurity:    Worry: Not on file    Inability: Not on file  . Transportation needs:    Medical: Not on file    Non-medical: Not on file    Tobacco Use  . Smoking status: Never Smoker  . Smokeless tobacco: Never Used  Substance and Sexual Activity  . Alcohol use: No  . Drug use: No  . Sexual activity: Not on file  Lifestyle  . Physical activity:    Days per week: Not on file    Minutes per session: Not on file  . Stress: Not on file  Relationships  . Social connections:    Talks on phone: Not on file    Gets together: Not on file    Attends religious service: Not on file    Active member of club or organization: Not on file    Attends meetings of clubs or organizations: Not on file    Relationship status: Not on file  . Intimate partner violence:    Fear of current or ex partner: Not on file    Emotionally abused: Not on file    Physically abused: Not on file    Forced sexual activity: Not on file  Other Topics Concern  . Not on file  Social History Narrative  . Not on file     Physical Exam  Vital Signs and Nursing Notes reviewed Vitals:   12/10/17 2230 12/11/17 0049  BP: 113/63 96/66  Pulse: 69 (!) 52  Resp: 13 17  Temp:  98.1 F (36.7 C)  SpO2: 100% 100%  CONSTITUTIONAL: Well-appearing, NAD NEURO:  Alert and oriented x 3, no focal deficits EYES:  eyes equal and reactive ENT/NECK:  no LAD, no JVD CARDIO: Tachycardic rate, well-perfused, normal S1 and S2 PULM:  CTAB no wheezing or rhonchi GI/GU:  normal bowel sounds, non-distended, non-tender MSK/SPINE:  No gross deformities; swelling of the left ring finger with mild erythema SKIN:  no rash, atraumatic PSYCH:  Appropriate speech and behavior  Diagnostic and Interventional Summary    EKG Interpretation  Date/Time:    Ventricular Rate:    PR Interval:    QRS Duration:   QT Interval:    QTC Calculation:   R Axis:     Text Interpretation:        Labs Reviewed  CBC - Abnormal; Notable for the following components:      Result Value   Hemoglobin 11.1 (*)    HCT 37.4 (*)    MCH 24.1 (*)    MCHC 29.7 (*)    Platelets 419 (*)     All other components within normal limits  COMPREHENSIVE METABOLIC PANEL - Abnormal; Notable for the following components:   Potassium 3.3 (*)    BUN <5 (*)    Calcium 7.9 (*)    Total Protein 5.7 (*)    Albumin 2.5 (*)    All other components within normal limits  URINALYSIS, ROUTINE W REFLEX MICROSCOPIC  RAPID URINE DRUG SCREEN, HOSP PERFORMED  ETHANOL    DG Chest 2 View  Final Result    DG Hand 2 View Left  Final Result      Medications  cephALEXin (KEFLEX) capsule 500 mg (has no administration in time range)  doxycycline (VIBRA-TABS) tablet 100 mg (has no administration in time range)  ibuprofen (ADVIL,MOTRIN) tablet 600 mg (has no administration in time range)  alum & mag hydroxide-simeth (MAALOX/MYLANTA) 200-200-20 MG/5ML suspension 30 mL (has no administration in time range)  ondansetron (ZOFRAN) tablet 4 mg (has no administration in time range)  sodium chloride 0.9 % bolus 1,000 mL (0 mLs Intravenous Stopped 12/11/17 0041)  dicyclomine (BENTYL) capsule 10 mg (10 mg Oral Given 12/10/17 2254)  acetaminophen (TYLENOL) tablet 650 mg (650 mg Oral Given 12/10/17 2253)  cephALEXin (KEFLEX) capsule 500 mg (500 mg Oral Given 12/10/17 2253)     Procedures Critical Care  ED Course and Medical Decision Making  I have reviewed the triage vital signs and the nursing notes.  Pertinent labs & imaging results that were available during my care of the patient were reviewed by me and considered in my medical decision making (see below for details). Clinical Course as of Dec 12 51  Nancy Fetter Dec 11, 5961  3062 37 year old male presenting with list of complaints for several weeks.  Tooth pain causing poor p.o. intake causing weight loss.  Shortness of breath for several weeks, abdominal pain for several weeks, right leg pain for several weeks.  Patient has rapid speech but denies SI, HI, AVH.  Does not appear to be acute harm to himself or others.  Labs largely unremarkable, mild hypokalemia and  low protein consistent with poor p.o. intake.   [MB]  0041 I am suspecting underlying psychiatric condition given the rapid, pressured, tangential speech, patient has poor insight into his medical history.  Denies SI, HI, AVH.  Willing to stay in the hospital for 2-3 nights if needed.  Currently medically cleared, does not require admission for any medical reason.  I would recommend a week of Keflex 500 mg 3 times  daily to treat for mild cellulitis of the digit.  I will consult TTS.  Here voluntarily.   [MB]    Clinical Course User Index [MB] Maudie Flakes, MD    Patient arrived with mild fever and tachycardia, quickly resolving.  No concern for sepsis.  Mild cellulitis will be well treated with 1 week of Keflex.  Mild malnutrition not severe enough for admission, concerned that underlying psychiatric condition is keeping him from caring for self.  Signed out to default provider pending TDS consult.  Barth Kirks. Sedonia Small, [place] [email or web address]  Final Clinical Impressions(s) / ED Diagnoses     ICD-10-CM   1. Cellulitis of finger of left hand L03.012   2. SOB (shortness of breath) R06.02 DG Chest 2 View    DG Chest 2 View  3. Psychiatric problem F99   4. Multiple complaints R68.89   5. Malnutrition, unspecified type [facility]) E46     ED Discharge Orders    None         Maudie Flakes, MD 12/11/17 [phone]

## 2017-12-10 NOTE — ED Triage Notes (Signed)
Patient reports multiple complaints.  Rambling on and on about problems.  Denies being suicidal.  Dressed for winter.

## 2017-12-11 ENCOUNTER — Encounter: Payer: Self-pay | Admitting: Surgery

## 2017-12-11 LAB — URINALYSIS, ROUTINE W REFLEX MICROSCOPIC
Bilirubin Urine: NEGATIVE
Glucose, UA: NEGATIVE mg/dL
Hgb urine dipstick: NEGATIVE
Ketones, ur: NEGATIVE mg/dL
Leukocytes, UA: NEGATIVE
Nitrite: NEGATIVE
Protein, ur: NEGATIVE mg/dL
Specific Gravity, Urine: 1.013 (ref 1.005–1.030)
pH: 6 (ref 5.0–8.0)

## 2017-12-11 LAB — RAPID URINE DRUG SCREEN, HOSP PERFORMED
Amphetamines: NOT DETECTED
Barbiturates: NOT DETECTED
Benzodiazepines: NOT DETECTED
Cocaine: NOT DETECTED
Opiates: NOT DETECTED
Tetrahydrocannabinol: NOT DETECTED

## 2017-12-11 LAB — COMPREHENSIVE METABOLIC PANEL
ALT: 6 U/L (ref 0–44)
AST: 19 U/L (ref 15–41)
Albumin: 2.5 g/dL — ABNORMAL LOW (ref 3.5–5.0)
Alkaline Phosphatase: 45 U/L (ref 38–126)
Anion gap: 8 (ref 5–15)
BUN: <5 mg/dL — ABNORMAL LOW (ref 6–20)
CO2: 24 mmol/L (ref 22–32)
Calcium: 7.9 mg/dL — ABNORMAL LOW (ref 8.9–10.3)
Chloride: 103 mmol/L (ref 98–111)
Creatinine, Ser: 0.79 mg/dL (ref 0.61–1.24)
GFR calc Af Amer: >60 mL/min (ref >60)
GFR calc non Af Amer: >60 mL/min (ref >60)
Glucose, Bld: 74 mg/dL (ref 70–99)
Potassium: 3.3 mmol/L — ABNORMAL LOW (ref 3.5–5.1)
Sodium: 135 mmol/L (ref 135–145)
Total Bilirubin: 0.8 mg/dL (ref 0.3–1.2)
Total Protein: 5.7 g/dL — ABNORMAL LOW (ref 6.5–8.1)

## 2017-12-11 LAB — ETHANOL: Alcohol, Ethyl (B): <10 mg/dL (ref <10)

## 2017-12-11 MED ORDER — ALUM & MAG HYDROXIDE-SIMETH 200-200-20 MG/5ML PO SUSP: 30.0000 mL | Freq: Four times a day (QID) | ORAL | Status: DC | PRN

## 2017-12-11 MED ORDER — DOXYCYCLINE HYCLATE 100 MG PO TABS
100.0000 mg | ORAL_TABLET | Freq: Two times a day (BID) | ORAL | Status: DC
  Administered 2017-12-11: 100 mg via ORAL
  Filled 2017-12-11: qty 1

## 2017-12-11 MED ORDER — DOXYCYCLINE HYCLATE 100 MG PO CAPS: 100.0000 mg | ORAL_CAPSULE | Freq: Two times a day (BID) | ORAL | 0 refills | Status: DC

## 2017-12-11 MED ORDER — ONDANSETRON HCL 4 MG PO TABS: 4.0000 mg | ORAL_TABLET | Freq: Three times a day (TID) | ORAL | Status: DC | PRN

## 2017-12-11 MED ORDER — CEPHALEXIN 250 MG PO CAPS
500.0000 mg | ORAL_CAPSULE | Freq: Four times a day (QID) | ORAL | Status: DC
  Administered 2017-12-11: 500 mg via ORAL
  Filled 2017-12-11: qty 2

## 2017-12-11 MED ORDER — IBUPROFEN 400 MG PO TABS: 600.0000 mg | ORAL_TABLET | Freq: Three times a day (TID) | ORAL | Status: DC | PRN

## 2017-12-11 MED ORDER — CEPHALEXIN 500 MG PO CAPS: 500.0000 mg | ORAL_CAPSULE | Freq: Four times a day (QID) | ORAL | 0 refills | Status: DC

## 2017-12-11 NOTE — ED Notes (Signed)
Pt discharged from ED; instructions provided and scripts given; Pt encouraged to return to ED if symptoms worsen and to f/u with PCP; Pt verbalized understanding of all instructions 

## 2017-12-11 NOTE — BH Assessment (Signed)
Tele Assessment Note   Patient Name: Carl Carey MRN: [id number] Referring Physician: Dr. Barth Kirks. Sedonia Small, MD Location of Patient:  [facility] Location of Provider: [place]  [name] is a single 37 y.o. male who voluntarily came to [facility] for evaluation for multiple physical compliants and ED provider requested TTS consult to ensure "underlying psychiatric condition is (not) keeping pt from caring for self."  Pt was rambling throughout the assessment about his physical ailments despite being redirected multiple times to answer the questions directly.  Pt denies SI/HI/A/V-hallucinations.  Pt admits substance use.  Pt states "I drink alcohol when I have some extra money but it's been a couple of days" and "its been 6 months since I smoked marijuana."     Pt denies history of MH/SA inpatient or outpatient treatment.  Pt reports being homeless and disabled.  Pt denies history of abuse.  Patient was wearing scrubs and appeared appropriately groomed.  Pt was alert throughout the assessment.  Patient made poor eye contact and had normal psychomotor activity.  Patient spoke in a normal voice with pressured speech.  Pt expressed feeling sad.  Pt's affect appeared euthymic normal mood and incongruent with stated mood. Pt's thought process was logical and coherent.  Pt presented with good insight and judgement.  Pt did not appear to be responding to internal stimuli.  Pt was able to contract for safety.  Disposition: Case discussed with [facility] provider, Darlyne Russian, PA who recommends the patient is discharged and follow up with [facility] for outpatient [facility] community resources.  LPC informed ER provider, Quincy Carnes, PA and pt's nurse, Magda Paganini, RN of the recommended disposition.  Diagnosis: Unspecified personality Disorder; Alcohol Use Disorder, Mild  Past Medical History:  Past Medical History:  Diagnosis Date  . Panic attack     Past Surgical History:   Procedure Laterality Date  . MULTIPLE EXTRACTIONS WITH ALVEOLOPLASTY N/A 07/29/2014   Procedure: Extraction of tooth #'s 1,15,16,17,18 with alveoloplasty and gross debridement of teeth;  Surgeon: Lenn Cal, DDS;  Location: [facility];  Service: Oral Surgery;  Laterality: N/A;  . NECK SURGERY      Family History:  Family History  Problem Relation Age of Onset  . Hypertension Maternal Grandmother     Social History:  reports that he has never smoked. He has never used smokeless tobacco. He reports that he does not drink alcohol or use drugs.  Additional Social History:  Alcohol / Drug Use Pain Medications: See MARs Prescriptions: See MARs Over the Counter: See MARs History of alcohol / drug use?: Yes Longest period of sobriety (when/how long): "Couple days" Substance #1 Name of Substance 1: Alcohol 1 - Age of First Use: unknown 1 - Amount (size/oz): unknown 1 - Frequency: "when I have some money" 1 - Duration: ongoing 1 - Last Use / Amount: "few days ago" Substance #2 Name of Substance 2: Marijuana 2 - Age of First Use: 37 y/o 2 - Amount (size/oz): unknown 2 - Frequency: "when I has enough money I don't money like that" 2 - Duration: ongoing 2 - Last Use / Amount: "6 months ago"  CIWA: CIWA-Ar BP: 96/66 Pulse Rate: (!) 52 COWS:    Allergies: No Known Allergies  Home Medications:  (Not in a hospital admission)  OB/GYN Status:  No LMP for male patient.  General Assessment Data Location of Assessment: [facility] ED TTS Assessment: In system Is this a Tele or Face-to-Face Assessment?: Tele Assessment Is this an  Initial Assessment or a Re-assessment for this encounter?: Initial Assessment Marital status: Single Maiden name: Kirk Is patient pregnant?: No Pregnancy Status: No Living Arrangements: Other (Comment)(Homeless for 6 months) Can pt return to current living arrangement?: Yes Admission Status: Voluntary Is patient capable of signing voluntary admission?:  Yes Referral Source: Self/Family/Friend Insurance type: [place]     Crisis Care Plan Living Arrangements: Other (Comment)(Homeless for 6 months) Legal Guardian: Other:(Self) Name of Psychiatrist: No Name of Therapist: No  Education Status Is patient currently in school?: No Is the patient employed, unemployed or receiving disability?: Receiving disability income([place])  Risk to self with the past 6 months Suicidal Ideation: No Has patient been a risk to self within the past 6 months prior to admission? : No Suicidal Intent: No Has patient had any suicidal intent within the past 6 months prior to admission? : No Is patient at risk for suicide?: No Suicidal Plan?: No Has patient had any suicidal plan within the past 6 months prior to admission? : No Access to Means: No Previous Attempts/Gestures: No Triggers for Past Attempts: None known Intentional Self Injurious Behavior: None Family Suicide History: No Persecutory voices/beliefs?: No Depression: No Substance abuse history and/or treatment for substance abuse?: No Suicide prevention information given to non-admitted patients: Not applicable  Risk to Others within the past 6 months Homicidal Ideation: No Does patient have any lifetime risk of violence toward others beyond the six months prior to admission? : No Thoughts of Harm to Others: No Current Homicidal Intent: No Current Homicidal Plan: No Access to Homicidal Means: No History of harm to others?: No Assessment of Violence: None Noted Does patient have access to weapons?: No Criminal Charges Pending?: No Does patient have a court date: No Is patient on probation?: No  Psychosis Hallucinations: None noted Delusions: None noted  Mental Status Report Appearance/Hygiene: In scrubs Eye Contact: Poor Motor Activity: Unremarkable Speech: Rapid Level of Consciousness: Alert, Quiet/awake Mood: Sad, Pleasant Affect: Appropriate to circumstance,  Sad Anxiety Level: None Thought Processes: Tangential Judgement: Partial Orientation: Person, Place, Time, Appropriate for developmental age Obsessive Compulsive Thoughts/Behaviors: None  Cognitive Functioning Concentration: Fair Memory: Recent Intact, Remote Intact Is patient IDD: No Is patient DD?: No Insight: Poor Impulse Control: Fair Appetite: Poor Have you had any weight changes? : Loss Amount of the weight change? (lbs): 5 lbs Sleep: Decreased Total Hours of Sleep: 3 Vegetative Symptoms: Decreased grooming  ADLScreening [facility] Assessment Services) Patient's cognitive ability adequate to safely complete daily activities?: Yes Patient able to express need for assistance with ADLs?: Yes Independently performs ADLs?: Yes (appropriate for developmental age)  Prior Inpatient Therapy Prior Inpatient Therapy: No  Prior Outpatient Therapy Prior Outpatient Therapy: No Does patient have an ACCT team?: No Does patient have Intensive In-House Services?  : No Does patient have [facility] services? : No Does patient have P4CC services?: No  ADL Screening (condition at time of admission) Patient's cognitive ability adequate to safely complete daily activities?: Yes Is the patient deaf or have difficulty hearing?: No Does the patient have difficulty seeing, even when wearing glasses/contacts?: No Does the patient have difficulty concentrating, remembering, or making decisions?: No Patient able to express need for assistance with ADLs?: Yes Does the patient have difficulty dressing or bathing?: No Independently performs ADLs?: Yes (appropriate for developmental age) Does the patient have difficulty walking or climbing stairs?: Yes(Pt reports difficulty on his right leg due to swelling) Weakness of Legs: None Weakness of Arms/Hands: None  Home Assistive Devices/Equipment Home  Assistive Devices/Equipment: None    Abuse/Neglect Assessment (Assessment to be complete while patient is  alone) Abuse/Neglect Assessment Can Be Completed: Yes Physical Abuse: Denies Verbal Abuse: Denies Sexual Abuse: Denies Exploitation of patient/patient's resources: Denies Self-Neglect: Denies Values / Beliefs Cultural Requests During Hospitalization: None Spiritual Requests During Hospitalization: None Consults Spiritual Care Consult Needed: No Social Work Consult Needed: No Regulatory affairs officer (For Healthcare) Does Patient Have a Medical Advance Directive?: No Would patient like information on creating a medical advance directive?: No - Patient declined       Disposition: Case discussed with [place] provider, Darlyne Russian, PA who recommends the patient is discharged and follow up with [facility] for outpatient [facility] community resources.  LPC informed ER provider, Quincy Carnes, PA and pt's nurse, Magda Paganini, RN of the recommended disposition.  Disposition Initial Assessment Completed for this Encounter: Yes  This service was provided via telemedicine using a 2-way, interactive audio and video technology.  Names of all persons participating in this telemedicine service and their role in this encounter. Name: Tamaj Jurgens Role: Patient  Name: Ala Kratz L. Lorine Iannaccone, MS, LPC, [place] Role: Triage Therapist  Name: Darlyne Russian, [place] Role: [facility] Provider  Name:  Role:     Elloise Roark L Malayla Granberry 12/11/2017 1:59 AM

## 2017-12-11 NOTE — ED Notes (Signed)
Patient wanded and cleared by security at this time. Patient in purple scrubs and belongings inventoried and placed at nurse's desk until transfer to [place].

## 2017-12-11 NOTE — Progress Notes (Signed)
CM met patient in [facility] ED waiting room, patient is chronically homeless.   Patient reports not receiving a prescription at discharge. ED CM noted patient had prescription on file from 8/11 for Doxy and Keflex. CM discussed with EDP C. Lawyer PA-C who confirms both antibiotics were prescribed. Antibiotics called in, to [facility] on [address] at request of patient. ED CM contacted [facility] and verified insurance. Patient states he is on his way to the pharmacy. CM inquired about emergency contact and activity with county agency patient declined the assistance.

## 2017-12-11 NOTE — BHH Counselor (Signed)
  [place] Disposition:   Case discussed with [facility] provider, Darlyne Russian, PA. Patient does not meet inpatient criteria therefore; pt is recommended for discharge and follow up with [facility] for outpatient [facility] community resources.  LPC informed ER provider, Quincy Carnes, PA and pt's nurse, Magda Paganini, RN of the recommended disposition.  Meiling Hendriks L. Shaquan Missey, MS, [place], [facility] Therapeutic Triage Specialist  [phone]

## 2018-11-07 ENCOUNTER — Emergency Department (HOSPITAL_COMMUNITY)
Admission: EM | Admit: 2018-11-07 | Discharge: 2018-11-09 | Disposition: A | Discharge status: Other Acute Inpatient Hospital | Payer: Medicaid Other | Attending: Emergency Medicine | Admitting: Emergency Medicine

## 2018-11-07 ENCOUNTER — Emergency Department (HOSPITAL_COMMUNITY): Payer: Medicaid Other

## 2018-11-07 ENCOUNTER — Encounter (HOSPITAL_COMMUNITY): Payer: Self-pay | Admitting: Emergency Medicine

## 2018-11-07 DIAGNOSIS — Z20828 Contact with and (suspected) exposure to other viral communicable diseases: Secondary | ICD-10-CM | POA: Insufficient documentation

## 2018-11-07 DIAGNOSIS — Z590 Homelessness: Secondary | ICD-10-CM | POA: Insufficient documentation

## 2018-11-07 DIAGNOSIS — R531 Weakness: Secondary | ICD-10-CM | POA: Insufficient documentation

## 2018-11-07 DIAGNOSIS — F29 Unspecified psychosis not due to a substance or known physiological condition: Secondary | ICD-10-CM

## 2018-11-07 DIAGNOSIS — R443 Hallucinations, unspecified: Secondary | ICD-10-CM | POA: Insufficient documentation

## 2018-11-07 DIAGNOSIS — F319 Bipolar disorder, unspecified: Secondary | ICD-10-CM | POA: Insufficient documentation

## 2018-11-07 LAB — CBC
HCT: 39.2 % (ref 39.0–52.0)
Hemoglobin: 12.3 g/dL — ABNORMAL LOW (ref 13.0–17.0)
MCH: 26 pg (ref 26.0–34.0)
MCHC: 31.4 g/dL (ref 30.0–36.0)
MCV: 82.9 fL (ref 80.0–100.0)
Platelets: 333 K/uL (ref 150–400)
RBC: 4.73 MIL/uL (ref 4.22–5.81)
RDW: 14.3 % (ref 11.5–15.5)
WBC: 6.5 K/uL (ref 4.0–10.5)
nRBC: 0 % (ref 0.0–0.2)

## 2018-11-07 LAB — BASIC METABOLIC PANEL WITH GFR
Anion gap: 7 (ref 5–15)
BUN: <5 mg/dL — ABNORMAL LOW (ref 6–20)
CO2: 24 mmol/L (ref 22–32)
Calcium: 8.7 mg/dL — ABNORMAL LOW (ref 8.9–10.3)
Chloride: 109 mmol/L (ref 98–111)
Creatinine, Ser: 0.8 mg/dL (ref 0.61–1.24)
GFR calc Af Amer: >60 mL/min (ref >60)
GFR calc non Af Amer: >60 mL/min (ref >60)
Glucose, Bld: 97 mg/dL (ref 70–99)
Potassium: 3.2 mmol/L — ABNORMAL LOW (ref 3.5–5.1)
Sodium: 140 mmol/L (ref 135–145)

## 2018-11-07 LAB — URINALYSIS, ROUTINE W REFLEX MICROSCOPIC
Bilirubin Urine: NEGATIVE
Glucose, UA: NEGATIVE mg/dL
Hgb urine dipstick: NEGATIVE
Ketones, ur: NEGATIVE mg/dL
Leukocytes,Ua: NEGATIVE
Nitrite: NEGATIVE
Protein, ur: NEGATIVE mg/dL
Specific Gravity, Urine: 1.015 (ref 1.005–1.030)
pH: 6 (ref 5.0–8.0)

## 2018-11-07 LAB — ETHANOL: Alcohol, Ethyl (B): <10 mg/dL (ref <10)

## 2018-11-07 LAB — RAPID URINE DRUG SCREEN, HOSP PERFORMED
Amphetamines: NOT DETECTED
Barbiturates: NOT DETECTED
Benzodiazepines: NOT DETECTED
Cocaine: NOT DETECTED
Opiates: NOT DETECTED
Tetrahydrocannabinol: NOT DETECTED

## 2018-11-07 MED ORDER — ACETAMINOPHEN 325 MG PO TABS
650.0000 mg | ORAL_TABLET | ORAL | Status: DC | PRN
  Administered 2018-11-08: 650 mg via ORAL
  Filled 2018-11-07: qty 2

## 2018-11-07 MED ORDER — ALUM & MAG HYDROXIDE-SIMETH 200-200-20 MG/5ML PO SUSP: 30.0000 mL | Freq: Four times a day (QID) | ORAL | Status: DC | PRN

## 2018-11-07 MED ORDER — ONDANSETRON HCL 4 MG PO TABS: 4.0000 mg | ORAL_TABLET | Freq: Three times a day (TID) | ORAL | Status: DC | PRN

## 2018-11-07 NOTE — ED Provider Notes (Signed)
[facility] EMERGENCY DEPARTMENT Provider Note   CSN: [id number] Arrival date & time: 11/07/18  1225     History   Chief Complaint Chief Complaint  Patient presents with  . Urinary Tract Infection    HPI Carl Carey is a 38 y.o. male.     The history is provided by the patient. No language interpreter was used.  Urinary Tract Infection Presenting symptoms: dysuria   Relieved by:  Nothing Worsened by:  Nothing Ineffective treatments:  None tried Associated symptoms: no abdominal pain   Mental Health Problem Presenting symptoms: agitation, disorganized thought process and hallucinations   Degree of incapacity (severity):  Moderate Context: not alcohol use and not drug abuse   Relieved by:  Nothing Associated symptoms: no abdominal pain   Pt difficult to obtain a history from.  Pt has pressured speech, tangential.  He admits to hallucinations.  He denies any medications.  Pt states he has Parkinson's and rashes all over his body.    Past Medical History:  Diagnosis Date  . Panic attack     Patient Active Problem List   Diagnosis Date Noted  . Fever in adult 07/25/2014  . Protein-calorie malnutrition, severe ([place]) 07/23/2014  . Parkinsonian features 07/21/2014  . Dysphagia 07/21/2014  . Microcytic anemia 07/21/2014    Past Surgical History:  Procedure Laterality Date  . MULTIPLE EXTRACTIONS WITH ALVEOLOPLASTY N/A 07/29/2014   Procedure: Extraction of tooth #'s 1,15,16,17,18 with alveoloplasty and gross debridement of teeth;  Surgeon: Lenn Cal, DDS;  Location: [place];  Service: Oral Surgery;  Laterality: N/A;  . NECK SURGERY          Home Medications    Prior to Admission medications   Medication Sig Start Date End Date Taking? Authorizing Provider  cephALEXin (KEFLEX) 500 MG capsule Take 1 capsule (500 mg total) by mouth 4 (four) times daily. 12/11/17   Palumbo, April, MD  dicyclomine (BENTYL) 20 MG tablet Take 1 tablet (20 mg  total) by mouth 2 (two) times daily. Patient not taking: Reported on 12/10/2017 04/17/16   Orpah Greek, MD  doxycycline (VIBRAMYCIN) 100 MG capsule Take 1 capsule (100 mg total) by mouth 2 (two) times daily. One po bid x 7 days 12/11/17   Palumbo, April, MD  mirtazapine (REMERON SOL-TAB) 30 MG disintegrating tablet Take 1 tablet (30 mg total) by mouth at bedtime. Patient not taking: Reported on 12/10/2017 04/17/16   Orpah Greek, MD  oxyCODONE-acetaminophen (PERCOCET/ROXICET) 5-325 MG per tablet Take 1-2 tablets by mouth every 6 (six) hours as needed for moderate pain. Patient not taking: Reported on 04/17/2016 07/29/14   Delfina Redwood, MD    Family History Family History  Problem Relation Age of Onset  . Hypertension Maternal Grandmother     Social History Social History   Tobacco Use  . Smoking status: Never Smoker  . Smokeless tobacco: Never Used  Substance Use Topics  . Alcohol use: Yes  . Drug use: No     Allergies   Patient has no known allergies.   Review of Systems Review of Systems  Gastrointestinal: Negative for abdominal pain.  Genitourinary: Positive for dysuria.  Psychiatric/Behavioral: Positive for agitation and hallucinations.  All other systems reviewed and are negative.    Physical Exam Updated Vital Signs BP 115/69   Pulse (!) 49   Temp 98.6 F (37 C) (Oral)   Resp 16   SpO2 100%   Physical Exam Vitals signs and nursing note reviewed.  Constitutional:      Appearance: He is well-developed.  HENT:     Head: Normocephalic and atraumatic.     Right Ear: External ear normal.     Left Ear: External ear normal.     Nose: Nose normal.     Mouth/Throat:     Mouth: Mucous membranes are moist.  Eyes:     Conjunctiva/sclera: Conjunctivae normal.  Neck:     Musculoskeletal: Neck supple.  Cardiovascular:     Rate and Rhythm: Normal rate and regular rhythm.     Heart sounds: No murmur.  Pulmonary:     Effort: Pulmonary  effort is normal. No respiratory distress.     Breath sounds: Normal breath sounds.  Abdominal:     Palpations: Abdomen is soft.     Tenderness: There is no abdominal tenderness.  Musculoskeletal: Normal range of motion.  Skin:    General: Skin is warm and dry.  Neurological:     General: No focal deficit present.     Mental Status: He is alert.  Psychiatric:        Mood and Affect: Mood normal.      ED Treatments / Results  Labs (all labs ordered are listed, but only abnormal results are displayed) Labs Reviewed  BASIC METABOLIC PANEL - Abnormal; Notable for the following components:      Result Value   Potassium 3.2 (*)    BUN <5 (*)    Calcium 8.7 (*)    All other components within normal limits  CBC - Abnormal; Notable for the following components:   Hemoglobin 12.3 (*)    All other components within normal limits  URINE CULTURE  URINALYSIS, ROUTINE W REFLEX MICROSCOPIC  ETHANOL  RAPID URINE DRUG SCREEN, HOSP PERFORMED    EKG None  Radiology No results found.  Procedures Procedures (including critical care time)  Medications Ordered in ED Medications - No data to display   Initial Impression / Assessment and Plan / ED Course  I have reviewed the triage vital signs and the nursing notes.  Pertinent labs & imaging results that were available during my care of the patient were reviewed by me and considered in my medical decision making (see chart for details).        MDM  Records reviewed.  Records report pt is homeless.  No firm psychiatric illness diagnosis.  Pt admits to recent hallucinations.  He denies any medications.  I will consult TTS.  I suspect Schizophrenia with hallucinations,  Possible manic disorder.    Final Clinical Impressions(s) / ED Diagnoses   Final diagnoses:  Psychosis, unspecified psychosis type [facility])    ED Discharge Orders    None    TTS consulted and advised inpatient admission for treatment    Sidney Ace  11/07/18 2158    Davonna Belling, MD 11/07/18 2259

## 2018-11-07 NOTE — Progress Notes (Signed)
Pt meets inpatient criteria per Priscille Loveless, NP. Referral information has been sent to the following hospitals for review:  [facility]  [facility]  [facility]  [facility]     Disposition will continue to follow for inpatient placement needs.   Audree Camel, LCSW, [place] Disposition [facility]/TTS [phone]
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
## 2018-11-07 NOTE — ED Triage Notes (Signed)
Pt with rapid and repetitive speech, reports weakness and other urinary symptoms. He is difficult to understand. A/O at triage and cooperative.

## 2018-11-07 NOTE — ED Notes (Signed)
Patient transported to CT 

## 2018-11-07 NOTE — ED Notes (Signed)
Per staffing no safety sitter available at this time. At this time pt is voluntary

## 2018-11-08 LAB — URINE CULTURE: Culture: <10000 — AB

## 2018-11-08 MED ORDER — ZOLPIDEM TARTRATE 5 MG PO TABS
5.0000 mg | ORAL_TABLET | Freq: Every evening | ORAL | Status: DC | PRN
  Administered 2018-11-08: 5 mg via ORAL
  Filled 2018-11-08 (×2): qty 1

## 2018-11-08 NOTE — ED Notes (Signed)
Pt has been wanded, changed into burgandy scrubs and belongings inventoried in lockers in [facility]

## 2018-11-08 NOTE — [facility] Counselor (Addendum)
TTS reassessment: Patient is alert and oriented x2 and appears manic. Patient continues to display rapid, tangential speech and his thoughts are disorganized. Patient states "I'm here because I have a rash all over my muscles and my scrotum. I feel like I have snake venom in my back." Patient struggled to answer assessment questions and was difficult to redirect, however was pleasant. Patient denies SI/HI/AVH. Patient continues to be recommended for inpatient.

## 2018-11-09 ENCOUNTER — Inpatient Hospital Stay (HOSPITAL_COMMUNITY)
Admission: AD | Admit: 2018-11-09 | Discharge: 2018-11-13 | DRG: 885 | Disposition: A | Discharge status: Home / Self Care | Payer: Medicaid Other | Attending: Psychiatry | Admitting: Psychiatry

## 2018-11-09 ENCOUNTER — Other Ambulatory Visit: Payer: Self-pay | Admitting: Behavioral Health

## 2018-11-09 ENCOUNTER — Encounter (HOSPITAL_COMMUNITY): Payer: Self-pay

## 2018-11-09 ENCOUNTER — Other Ambulatory Visit: Payer: Self-pay

## 2018-11-09 DIAGNOSIS — F410 Panic disorder [episodic paroxysmal anxiety] without agoraphobia: Secondary | ICD-10-CM | POA: Diagnosis present

## 2018-11-09 DIAGNOSIS — Z590 Homelessness: Secondary | ICD-10-CM

## 2018-11-09 DIAGNOSIS — F319 Bipolar disorder, unspecified: Principal | ICD-10-CM | POA: Diagnosis present

## 2018-11-09 DIAGNOSIS — F17290 Nicotine dependence, other tobacco product, uncomplicated: Secondary | ICD-10-CM | POA: Diagnosis present

## 2018-11-09 LAB — NOVEL CORONAVIRUS, NAA (HOSP ORDER, SEND-OUT TO REF LAB; TAT 18-24 HRS): SARS-CoV-2, NAA: NOT DETECTED

## 2018-11-09 MED ORDER — HALOPERIDOL 5 MG PO TABS: 5.0000 mg | ORAL_TABLET | Freq: Four times a day (QID) | ORAL | Status: DC | PRN

## 2018-11-09 MED ORDER — LORAZEPAM 2 MG/ML IJ SOLN: 2.0000 mg | Freq: Four times a day (QID) | INTRAMUSCULAR | Status: DC | PRN

## 2018-11-09 MED ORDER — DIPHENHYDRAMINE HCL 25 MG PO CAPS: 50.0000 mg | ORAL_CAPSULE | Freq: Four times a day (QID) | ORAL | Status: DC | PRN

## 2018-11-09 MED ORDER — ACETAMINOPHEN 325 MG PO TABS
650.0000 mg | ORAL_TABLET | Freq: Four times a day (QID) | ORAL | Status: DC | PRN
  Administered 2018-11-10 – 2018-11-11 (×3): 650 mg via ORAL
  Filled 2018-11-09 (×3): qty 2

## 2018-11-09 MED ORDER — DIPHENHYDRAMINE HCL 50 MG/ML IJ SOLN: 50.0000 mg | Freq: Four times a day (QID) | INTRAMUSCULAR | Status: DC | PRN

## 2018-11-09 MED ORDER — LORAZEPAM 1 MG PO TABS
2.0000 mg | ORAL_TABLET | Freq: Four times a day (QID) | ORAL | Status: DC | PRN
  Administered 2018-11-11: 2 mg via ORAL
  Filled 2018-11-09: qty 2

## 2018-11-09 MED ORDER — MAGNESIUM HYDROXIDE 400 MG/5ML PO SUSP: 30.0000 mL | Freq: Every day | ORAL | Status: DC | PRN

## 2018-11-09 MED ORDER — HALOPERIDOL LACTATE 5 MG/ML IJ SOLN: 5.0000 mg | Freq: Four times a day (QID) | INTRAMUSCULAR | Status: DC | PRN

## 2018-11-09 MED ORDER — LORAZEPAM 1 MG PO TABS: 2.0000 mg | ORAL_TABLET | Freq: Four times a day (QID) | ORAL | Status: DC | PRN

## 2018-11-09 MED ORDER — ALUM & MAG HYDROXIDE-SIMETH 200-200-20 MG/5ML PO SUSP: 30.0000 mL | ORAL | Status: DC | PRN

## 2018-11-09 MED ORDER — TRAZODONE HCL 50 MG PO TABS
50.0000 mg | ORAL_TABLET | Freq: Every evening | ORAL | Status: DC | PRN
  Administered 2018-11-09 – 2018-11-11 (×2): 50 mg via ORAL
  Filled 2018-11-09 (×14): qty 1

## 2018-11-09 NOTE — Progress Notes (Signed)
Per Dr, Dwyane Dee agitation protocol added, Haldol 5 mg po Q6hrs, Ativan 2 mg po Q6hrs and Benadryl; 50 mg po Q6hrs. Patient should have EKG done and order for EKG have been placed.

## 2018-11-09 NOTE — Progress Notes (Signed)
Patient skin assessment revealed fatty lipomas to his underarms bilaterally and scratches to his L forearm. Patient appeared fixated on a skin tag to his thigh. Otherwise skin is intact.  Belongings searched for safety with no contraband found. Valuables secured in locker with security.  Vitals obtained and were WNL. Patient signed for consents and was escorted to the unit without difficulty.

## 2018-11-09 NOTE — Plan of Care (Signed)
D: Patient is alert and cooperative. Denies SI, HI, AVH, and verbally contracts for safety. Patient reports many somatic complaints. He is hyper verbal, tangential, disorganized and hard to understand.    A: Medications administered per MD order. Support provided. Patient educated on safety on the unit and medications. Routine safety checks every 15 minutes. Patient stated understanding to tell nurse about any new physical symptoms. Patient understands to tell staff of any needs.     R: No adverse drug reactions noted. Patient verbally contracts for safety. Patient remains safe at this time and will continue to monitor.   Problem: Education: Goal: Knowledge of [facility] Education information/materials will improve Outcome: Progressing   Problem: Safety: Goal: Periods of time without injury will increase Outcome: Progressing   Patient oriented tot he unit. Patient remains safe and will continue to monitor.   [place] NOVEL CORONAVIRUS (COVID-19) DAILY CHECK-OFF SYMPTOMS - answer yes or no to each - every day NO YES  Have you had a fever in the past 24 hours?  Fever (Temp > 37.80C / 100F) X   Have you had any of these symptoms in the past 24 hours? New Cough  Sore Throat   Shortness of Breath  Difficulty Breathing  Unexplained Body Aches   X   Have you had any one of these symptoms in the past 24 hours not related to allergies?   Runny Nose  Nasal Congestion  Sneezing   X   If you have had runny nose, nasal congestion, sneezing in the past 24 hours, has it worsened?  X   EXPOSURES - check yes or no X   Have you traveled outside the state in the past 14 days?  X   Have you been in contact with someone with a confirmed diagnosis of COVID-19 or PUI in the past 14 days without wearing appropriate PPE?  X   Have you been living in the same home as a person with confirmed diagnosis of COVID-19 or a PUI (household contact)?    X   Have you been diagnosed with COVID-19?     X              What to do next: Answered NO to all: Answered YES to anything:   Proceed with unit schedule Follow the [facility] Inpatient Flowsheet.

## 2018-11-09 NOTE — Progress Notes (Signed)
Pt. meets criteria for inpatient treatment per Mordecai Maes, NP.  No appropriate beds available at [facility]. Referred out to the following hospitals:  [facility] Details [facility] Details [facility] Details [place] Details [facility] Details [facility] Details [facility] Details [facility] Details [facility] Details [facility] Details [place] Details [facility] Details [facility] Details [place] Details [place] Details  Disposition CSW will continue to follow for placement.  Areatha Keas. Judi Cong, MSW, [place] Disposition Clinical Social Work [phone] (cell) [phone] (office)

## 2018-11-09 NOTE — ED Notes (Signed)
Called [employer] to come and take pt to [facility].

## 2018-11-09 NOTE — ED Notes (Signed)
Report called to [place] rn at [facility]

## 2018-11-09 NOTE — ED Notes (Signed)
Discharged/transferred to [facility] in [facility] custody with IVC paperwork and all belongings. Pt signed for belongings from security -

## 2018-11-09 NOTE — ED Notes (Signed)
Breakfast ordered 

## 2018-11-09 NOTE — Progress Notes (Signed)
Pt accepted to  [facility], Bed 504-1 Carl Maes, NP is the accepting provider.  Carl Carey is the attending provider.  Call report to [place] ED notified.   Pt is to be IVC'd.  Pt may be transported by [employer] Pt scheduled  to arrive at [facility] as soon as IVC is served.  Carl Carey. Judi Cong, MSW, [place] Disposition Clinical Social Work [phone] (cell) [phone] (office)  CSW contacted Dr. Zenia Resides, [place] and requested that patient be IVC'd as he is experiencing mania and psychosis.

## 2018-11-10 DIAGNOSIS — F319 Bipolar disorder, unspecified: Principal | ICD-10-CM

## 2018-11-10 LAB — TSH: TSH: 2.039 u[IU]/mL (ref 0.350–4.500)

## 2018-11-10 LAB — LIPID PANEL
Cholesterol: 132 mg/dL (ref 0–200)
HDL: 47 mg/dL (ref >40)
LDL Cholesterol: 70 mg/dL (ref 0–99)
Total CHOL/HDL Ratio: 2.8 RATIO
Triglycerides: 75 mg/dL (ref <150)
VLDL: 15 mg/dL (ref 0–40)

## 2018-11-10 MED ORDER — BENZTROPINE MESYLATE 1 MG PO TABS
1.0000 mg | ORAL_TABLET | Freq: Two times a day (BID) | ORAL | Status: DC
  Administered 2018-11-10 – 2018-11-13 (×5): 1 mg via ORAL
  Filled 2018-11-10 (×13): qty 1

## 2018-11-10 MED ORDER — TEMAZEPAM 30 MG PO CAPS
30.0000 mg | ORAL_CAPSULE | Freq: Every day | ORAL | Status: DC
  Administered 2018-11-11 – 2018-11-12 (×2): 30 mg via ORAL
  Filled 2018-11-10 (×3): qty 1

## 2018-11-10 MED ORDER — HALOPERIDOL 5 MG PO TABS
5.0000 mg | ORAL_TABLET | Freq: Three times a day (TID) | ORAL | Status: DC
  Administered 2018-11-10 – 2018-11-13 (×7): 5 mg via ORAL
  Filled 2018-11-10 (×17): qty 1

## 2018-11-10 NOTE — Progress Notes (Signed)
Spirituality group facilitated by chaplain Jerene Pitch, MDiv, [facility]   Group Description  Spirituality group focused on topic of "hope."   Pts responded to topic and, engaged in facilitated dialog, expressing their understanding of topic and where they see this in their life.   Group facilitation drew on elements of client-centered, humanist / existential and psycho-dynamic group process.  Pt participation:   Did not attend group

## 2018-11-10 NOTE — Tx Team (Signed)
Interdisciplinary Treatment and Diagnostic Plan Update  11/10/2018 Time of Session: 9:04am Carl Carey MRN: [id number]  Principal Diagnosis: <principal problem not specified>  Secondary Diagnoses: Active Problems:   Bipolar 1 disorder (HCC)   Current Medications:  Current Facility-Administered Medications  Medication Dose Route Frequency Provider Last Rate Last Dose  . acetaminophen (TYLENOL) tablet 650 mg  650 mg Oral Q6H PRN Mordecai Maes, NP   650 mg at 11/10/18 0811  . alum & mag hydroxide-simeth (MAALOX/MYLANTA) 200-200-20 MG/5ML suspension 30 mL  30 mL Oral Q4H PRN Mordecai Maes, NP      . benztropine (COGENTIN) tablet 1 mg  1 mg Oral BID Johnn Hai, MD      . diphenhydrAMINE (BENADRYL) capsule 50 mg  50 mg Oral Q6H PRN Mordecai Maes, NP       Or  . diphenhydrAMINE (BENADRYL) injection 50 mg  50 mg Intramuscular Q6H PRN Mordecai Maes, NP      . haloperidol (HALDOL) tablet 5 mg  5 mg Oral Q6H PRN Mordecai Maes, NP       Or  . haloperidol lactate (HALDOL) injection 5 mg  5 mg Intramuscular Q6H PRN Mordecai Maes, NP      . haloperidol (HALDOL) tablet 5 mg  5 mg Oral TID Johnn Hai, MD      . LORazepam (ATIVAN) tablet 2 mg  2 mg Oral Q6H PRN Mordecai Maes, NP       Or  . LORazepam (ATIVAN) injection 2 mg  2 mg Intramuscular Q6H PRN Mordecai Maes, NP      . magnesium hydroxide (MILK OF MAGNESIA) suspension 30 mL  30 mL Oral Daily PRN Mordecai Maes, NP      . temazepam (RESTORIL) capsule 30 mg  30 mg Oral QHS Johnn Hai, MD      . traZODone (DESYREL) tablet 50 mg  50 mg Oral QHS,MR X 1 Lindon Romp A, NP   50 mg at 11/09/18 2120   PTA Medications: No medications prior to admission.    Patient Stressors:    Patient Strengths:    Treatment Modalities: Medication Management, Group therapy, Case management,  1 to 1 session with clinician, Psychoeducation, Recreational therapy.   Physician Treatment Plan for Primary Diagnosis: <principal  problem not specified> Long Term Goal(s): Improvement in symptoms so as ready for discharge Improvement in symptoms so as ready for discharge   Short Term Goals: Ability to disclose and discuss suicidal ideas Ability to demonstrate self-control will improve Ability to identify and develop effective coping behaviors will improve Ability to identify and develop effective coping behaviors will improve Ability to maintain clinical measurements within normal limits will improve Compliance with prescribed medications will improve  Medication Management: Evaluate patient's response, side effects, and tolerance of medication regimen.  Therapeutic Interventions: 1 to 1 sessions, Unit Group sessions and Medication administration.  Evaluation of Outcomes: Not Met  Physician Treatment Plan for Secondary Diagnosis: Active Problems:   Bipolar 1 disorder ([place])  Long Term Goal(s): Improvement in symptoms so as ready for discharge Improvement in symptoms so as ready for discharge   Short Term Goals: Ability to disclose and discuss suicidal ideas Ability to demonstrate self-control will improve Ability to identify and develop effective coping behaviors will improve Ability to identify and develop effective coping behaviors will improve Ability to maintain clinical measurements within normal limits will improve Compliance with prescribed medications will improve     Medication Management: Evaluate patient's response, side effects, and tolerance of medication regimen.  Therapeutic  Interventions: 1 to 1 sessions, Unit Group sessions and Medication administration.  Evaluation of Outcomes: Not Met   RN Treatment Plan for Primary Diagnosis: <principal problem not specified> Long Term Goal(s): Knowledge of disease and therapeutic regimen to maintain health will improve  Short Term Goals: Ability to participate in decision making will improve, Ability to verbalize feelings will improve, Ability to  disclose and discuss suicidal ideas, Ability to identify and develop effective coping behaviors will improve and Compliance with prescribed medications will improve  Medication Management: RN will administer medications as ordered by provider, will assess and evaluate patient's response and provide education to patient for prescribed medication. RN will report any adverse and/or side effects to prescribing provider.  Therapeutic Interventions: 1 on 1 counseling sessions, Psychoeducation, Medication administration, Evaluate responses to treatment, Monitor vital signs and CBGs as ordered, Perform/monitor CIWA, COWS, AIMS and Fall Risk screenings as ordered, Perform wound care treatments as ordered.  Evaluation of Outcomes: Not Met   LCSW Treatment Plan for Primary Diagnosis: <principal problem not specified> Long Term Goal(s): Safe transition to appropriate next level of care at discharge, Engage patient in therapeutic group addressing interpersonal concerns.  Short Term Goals: Engage patient in aftercare planning with referrals and resources and Increase skills for wellness and recovery  Therapeutic Interventions: Assess for all discharge needs, 1 to 1 time with Social worker, Explore available resources and support systems, Assess for adequacy in community support network, Educate family and significant other(s) on suicide prevention, Complete Psychosocial Assessment, Interpersonal group therapy.  Evaluation of Outcomes: Not Met   Progress in Treatment: Attending groups: No. Participating in groups: No. Taking medication as prescribed: Yes. Toleration medication: Yes. Family/Significant other contact made: No, will contact:  will contact if given consent to contact Patient understands diagnosis: Yes. Discussing patient identified problems/goals with staff: Yes. Medical problems stabilized or resolved: Yes. Denies suicidal/homicidal ideation: Yes. Issues/concerns per patient  self-inventory: No. Other:   New problem(s) identified: No, Describe:  None  New Short Term/Long Term Goal(s): Medication stabilization, elimination of SI thoughts, and development of a comprehensive mental wellness plan.   Patient Goals:  "Get a house and find somewhere to live and get a job"  Discharge Plan or Barriers: CSW will continue to follow up for appropriate referrals and possible discharge planning.  Reason for Continuation of Hospitalization: Mania Medication stabilization  Estimated Length of Stay: 2-3 days  Attendees: Patient: 11/10/2018   Physician: Dr. Johnn Hai, MD  11/10/2018  Nursing: Elberta Fortis, RN 11/10/2018   RN Care Manager: 11/10/2018   Social Worker: Ardelle Anton, LCSW 11/10/2018   Recreational Therapist:  11/10/2018   Other:  11/10/2018   Other:  11/10/2018   Other: 11/10/2018     Scribe for Treatment Team: Trecia Rogers, LCSW 11/10/2018 10:48 AM

## 2018-11-10 NOTE — [facility] Suicide Risk Assessment (Signed)
[facility] Admission Suicide Risk Assessment   Nursing information obtained from:  Patient Demographic factors:  Male Current Mental Status:  NA Loss Factors:  NA Historical Factors:  NA Risk Reduction Factors:  NA  Total Time spent with patient: 45 minutes Principal Problem: Untreated mania Diagnosis:  Active Problems:   Bipolar 1 disorder (HCC)  Subjective Data: Presents with numerous somatic delusions and complaints in a hyperverbal pressured and manic state of mind  Continued Clinical Symptoms:  Alcohol Use Disorder Identification Test Final Score (AUDIT): 3 The "Alcohol Use Disorders Identification Test", Guidelines for Use in Primary Care, Second Edition.  World Pharmacologist [facility]). Score between 0-7:  no or low risk or alcohol related problems. Score between 8-15:  moderate risk of alcohol related problems. Score between 16-19:  high risk of alcohol related problems. Score 20 or above:  warrants further diagnostic evaluation for alcohol dependence and treatment.   CLINICAL FACTORS: Chronic untreated mania  Musculoskeletal: Strength & Muscle Tone: within normal limits Gait & Station: normal Patient leans: N/A  Psychiatric Specialty Exam: Physical Exam  Nursing note and vitals reviewed.   Review of Systems  Neurological: Negative.   Negative for seizures however on other review of systems endorses everything he endorses tooth pain, he endorses muscle aches he endorses scrotal pain he endorses chest pain pretty much everything screen for  Blood pressure 109/60, pulse 67, temperature 98.2 F (36.8 C), temperature source Oral, resp. rate 18, height 5' 8"  (1.727 m), weight 63.5 kg, SpO2 98 %.Body mass index is 21.29 kg/m.  General Appearance: Casual  Eye Contact:  Minimal  Speech:  Garbled and Pressured  Volume:  Increased  Mood:  manic  Affect:  Full Range  Thought Process:  Disorganized, Irrelevant and Descriptions of Associations: Loose  Orientation:  Other:   Person place situation not date  Thought Content:  Illogical and Delusions  Suicidal Thoughts:  No  Homicidal Thoughts:  No  Memory:  Immediate;   Poor  Judgement:  Impaired  Insight:  Lacking  Psychomotor Activity:  Increased  Concentration:  Concentration: Poor  Recall:  Poor  Fund of Knowledge:  Poor  Language:  Poor  Akathisia:  Negative  Handed:  Right  AIMS (if indicated):     Assets:  Leisure Time Physical Health  ADL's:  Intact  Cognition:  WNL  Sleep:  Number of Hours: 6.75      COGNITIVE FEATURES THAT CONTRIBUTE TO RISK:  Polarized thinking    SUICIDE RISK:   Minimal: No identifiable suicidal ideation.  Patients presenting with no risk factors but with morbid ruminations; may be classified as minimal risk based on the severity of the depressive symptoms  PLAN OF CARE: Begin treatment for bipolar type condition and delusional believes  I certify that inpatient services furnished can reasonably be expected to improve the patient's condition.   Carl Hai, MD 11/10/2018, 10:38 AM

## 2018-11-10 NOTE — Plan of Care (Signed)
D: Pt alert and oriented on the unit. Pt denies SI/HI, A/VH. Pt was isolative to his room asleep for most of the day and did not participate in any unit groups or activities. Pt is cooperative on the unit.  A: Education, support and encouragement provided, q15 minute safety checks remain in effect. Medications administered per MD orders. R: No reactions/side effects to medicine noted. Pt denies any concerns at this time, and verbally contracts for safety. Pt ambulating on the unit with no issues. Pt remains safe on and off the unit.   Problem: Education: Goal: Verbalization of understanding the information provided will improve Outcome: Progressing   Problem: Activity: Goal: Interest or engagement in activities will improve Outcome: Not Progressing   Problem: Coping: Goal: Ability to verbalize frustrations and anger appropriately will improve Outcome: Progressing

## 2018-11-10 NOTE — H&P (Signed)
Psychiatric Admission Assessment Adult  Patient Identification: Carl Carey MRN:  [id number] Date of Evaluation:  11/10/2018 Chief Complaint:  Bipolar Psychosis Principal Diagnosis: Untreated mania Diagnosis:  Active Problems:   Bipolar 1 disorder ([place])  History of Present Illness:   [name] is 38 years of age he is not been hospitalized for psychiatric reasons by his report however he is homeless and he presented with numerous somatic complaints and delusions, he is rambling pressured and has really been in the state of mind for some period of time.  He denies ever being diagnosed with a psychiatric illness however his mother gave the collateral history that he does behave in a manic fashion those were not her words but states she "eventually slows down" but she lives in [place] and has really no contact with him apparently.  The patient himself has numerous and extensive rambling somatic delusions wanting the examiner to "look at my scrotum" "look at my teeth" showing me his teeth, reporting rashes and skin lesions reporting muscle aches muscle pains and bowel leakage inside of him so forth.  Rambling pressured and not organized fully.  Denies though taking medications for psychiatric reasons denies auditory or visual hallucinations and denies wanting to harm self or others.  Associated Signs/Symptoms: Depression Symptoms:  psychomotor agitation, (Hypo) Manic Symptoms:  Delusions, Distractibility, Flight of Ideas, Anxiety Symptoms:  Excessive Worry, Psychotic Symptoms:  Delusions, PTSD Symptoms: NA Total Time spent with patient: 45 minutes  Past Psychiatric History: untreated  Is the patient at risk to self? Yes.    Has the patient been a risk to self in the past 6 months? No.  Has the patient been a risk to self within the distant past? No.  Is the patient a risk to others? No.  Has the patient been a risk to others in the past 6 months? No.  Has the patient been a risk to  others within the distant past? No.   Alcohol Screening: 1. How often do you have a drink containing alcohol?: 2 to 4 times a month 2. How many drinks containing alcohol do you have on a typical day when you are drinking?: 3 or 4 3. How often do you have six or more drinks on one occasion?: Never AUDIT-C Score: 3 4. How often during the last year have you found that you were not able to stop drinking once you had started?: Never 5. How often during the last year have you failed to do what was normally expected from you becasue of drinking?: Never 6. How often during the last year have you needed a first drink in the morning to get yourself going after a heavy drinking session?: Never 7. How often during the last year have you had a feeling of guilt of remorse after drinking?: Never 8. How often during the last year have you been unable to remember what happened the night before because you had been drinking?: Never 9. Have you or someone else been injured as a result of your drinking?: No 10. Has a relative or friend or a doctor or another health worker been concerned about your drinking or suggested you cut down?: No Alcohol Use Disorder Identification Test Final Score (AUDIT): 3 Substance Abuse History in the last 12 months:  Yes.  possible  Consequences of Substance Abuse: NA Previous Psychotropic Medications: No  Psychological Evaluations: No  Past Medical History:  Past Medical History:  Diagnosis Date  . Panic attack     Past Surgical  History:  Procedure Laterality Date  . MULTIPLE EXTRACTIONS WITH ALVEOLOPLASTY N/A 07/29/2014   Procedure: Extraction of tooth #'s 1,15,16,17,18 with alveoloplasty and gross debridement of teeth;  Surgeon: Lenn Cal, DDS;  Location: [facility];  Service: Oral Surgery;  Laterality: N/A;  . NECK SURGERY     Family History:  Family History  Problem Relation Age of Onset  . Hypertension Maternal Grandmother    Family Psychiatric  History:  ukn Tobacco Screening: Have you used any form of tobacco in the last 30 days? (Cigarettes, Smokeless Tobacco, Cigars, and/or Pipes): Yes Tobacco use, Select all that apply: 5 or more cigarettes per day Social History:  Social History   Substance and Sexual Activity  Alcohol Use Yes     Social History   Substance and Sexual Activity  Drug Use No    Additional Social History:                           Allergies:  No Known Allergies Lab Results:  Results for orders placed or performed during the hospital encounter of 11/09/18 (from the past 48 hour(s))  Lipid panel     Status: None   Collection Time: 11/10/18  6:40 AM  Result Value Ref Range   Cholesterol 132 0 - 200 mg/dL   Triglycerides 75 <150 mg/dL   HDL 47 >40 mg/dL   Total CHOL/HDL Ratio 2.8 RATIO   VLDL 15 0 - 40 mg/dL   LDL Cholesterol 70 0 - 99 mg/dL    Comment:        Total Cholesterol/HDL:CHD Risk Coronary Heart Disease Risk Table                     Men   Women  1/2 Average Risk   3.4   3.3  Average Risk       5.0   4.4  2 X Average Risk   9.6   7.1  3 X Average Risk  23.4   11.0        Use the calculated Patient Ratio above and the CHD Risk Table to determine the patient's CHD Risk.        ATP III CLASSIFICATION (LDL):  <100     mg/dL   Optimal  100-129  mg/dL   Near or Above                    Optimal  130-159  mg/dL   Borderline  160-189  mg/dL   High  >190     mg/dL   Very High Performed at [facility] [address], [place], [place] [address]   TSH     Status: None   Collection Time: 11/10/18  6:40 AM  Result Value Ref Range   TSH 2.039 0.350 - 4.500 uIU/mL    Comment: Performed by a 3rd Generation assay with a functional sensitivity of <=0.01 uIU/mL. Performed at [facility], [place] [address] [place], [place] [address]     Blood Alcohol level:  Lab Results  Component Value Date   ETH <10 11/07/2018   ETH <10 16/10/3708    Metabolic  Disorder Labs:  No results found for: HGBA1C, MPG No results found for: PROLACTIN Lab Results  Component Value Date   CHOL 132 11/10/2018   TRIG 75 11/10/2018   HDL 47 11/10/2018   CHOLHDL 2.8 11/10/2018   VLDL 15 11/10/2018  [place] 70 11/10/2018    Current Medications: Current Facility-Administered Medications  Medication Dose Route Frequency Provider Last Rate Last Dose  . acetaminophen (TYLENOL) tablet 650 mg  650 mg Oral Q6H PRN Mordecai Maes, NP   650 mg at 11/10/18 0811  . alum & mag hydroxide-simeth (MAALOX/MYLANTA) 200-200-20 MG/5ML suspension 30 mL  30 mL Oral Q4H PRN Mordecai Maes, NP      . benztropine (COGENTIN) tablet 1 mg  1 mg Oral BID Johnn Hai, MD      . diphenhydrAMINE (BENADRYL) capsule 50 mg  50 mg Oral Q6H PRN Mordecai Maes, NP       Or  . diphenhydrAMINE (BENADRYL) injection 50 mg  50 mg Intramuscular Q6H PRN Mordecai Maes, NP      . haloperidol (HALDOL) tablet 5 mg  5 mg Oral Q6H PRN Mordecai Maes, NP       Or  . haloperidol lactate (HALDOL) injection 5 mg  5 mg Intramuscular Q6H PRN Mordecai Maes, NP      . haloperidol (HALDOL) tablet 5 mg  5 mg Oral TID Johnn Hai, MD      . LORazepam (ATIVAN) tablet 2 mg  2 mg Oral Q6H PRN Mordecai Maes, NP       Or  . LORazepam (ATIVAN) injection 2 mg  2 mg Intramuscular Q6H PRN Mordecai Maes, NP      . magnesium hydroxide (MILK OF MAGNESIA) suspension 30 mL  30 mL Oral Daily PRN Mordecai Maes, NP      . temazepam (RESTORIL) capsule 30 mg  30 mg Oral QHS Johnn Hai, MD      . traZODone (DESYREL) tablet 50 mg  50 mg Oral QHS,MR X 1 Lindon Romp A, NP   50 mg at 11/09/18 2120   PTA Medications: No medications prior to admission.    Musculoskeletal: Strength & Muscle Tone: within normal limits Gait & Station: normal Patient leans: N/A  Psychiatric Specialty Exam: Physical Exam  Nursing note and vitals reviewed.   Review of Systems  Neurological: Negative.   Negative for  seizures however on other review of systems endorses everything he endorses tooth pain, he endorses muscle aches he endorses scrotal pain he endorses chest pain pretty much everything screen for  Blood pressure 109/60, pulse 67, temperature 98.2 F (36.8 C), temperature source Oral, resp. rate 18, height 5' 8"  (1.727 m), weight 63.5 kg, SpO2 98 %.Body mass index is 21.29 kg/m.  General Appearance: Casual  Eye Contact:  Minimal  Speech:  Garbled and Pressured  Volume:  Increased  Mood:  manic  Affect:  Full Range  Thought Process:  Disorganized, Irrelevant and Descriptions of Associations: Loose  Orientation:  Other:  Person place situation not date  Thought Content:  Illogical and Delusions  Suicidal Thoughts:  No  Homicidal Thoughts:  No  Memory:  Immediate;   Poor  Judgement:  Impaired  Insight:  Lacking  Psychomotor Activity:  Increased  Concentration:  Concentration: Poor  Recall:  Poor  Fund of Knowledge:  Poor  Language:  Poor  Akathisia:  Negative  Handed:  Right  AIMS (if indicated):     Assets:  Leisure Time Physical Health  ADL's:  Intact  Cognition:  WNL  Sleep:  Number of Hours: 6.75     Treatment Plan Summary: Daily contact with patient to assess and evaluate symptoms and progress in treatment, Medication management and Plan Begin mood stabilizer/antipsychotic therapy  Observation Level/Precautions:  15 minute checks  Laboratory:  UDS  Psychotherapy: Reality based  Medications: Begin Haldol  Consultations: Not necessary  Discharge Concerns: Housing and long-term compliance  Estimated LOS: 5-10  Other: Bipolar manic with psychosis   Physician Treatment Plan for Primary Diagnosis: Chronic untreated bipolar symptomatology Long Term Goal(s): Improvement in symptoms so as ready for discharge  Short Term Goals: Ability to disclose and discuss suicidal ideas, Ability to demonstrate self-control will improve and Ability to identify and develop effective coping  behaviors will improve  Physician Treatment Plan for Secondary Diagnosis: Active Problems:   Bipolar 1 disorder ([place])  Long Term Goal(s): Improvement in symptoms so as ready for discharge  Short Term Goals: Ability to identify and develop effective coping behaviors will improve, Ability to maintain clinical measurements within normal limits will improve and Compliance with prescribed medications will improve  I certify that inpatient services furnished can reasonably be expected to improve the patient's condition.    Johnn Hai, MD 7/10/202010:41 AM

## 2018-11-10 NOTE — Progress Notes (Signed)
Patient ID: Carl Carey, male   DOB: 19-Apr-1981, 38 y.o.   MRN: [id number]  [place] NOVEL CORONAVIRUS (COVID-19) DAILY CHECK-OFF SYMPTOMS - answer yes or no to each - every day NO YES  Have you had a fever in the past 24 hours?  . Fever (Temp > 37.80C / 100F) X   Have you had any of these symptoms in the past 24 hours? . New Cough .  Sore Throat  .  Shortness of Breath .  Difficulty Breathing .  Unexplained Body Aches   X   Have you had any one of these symptoms in the past 24 hours not related to allergies?   . Runny Nose .  Nasal Congestion .  Sneezing   X   If you have had runny nose, nasal congestion, sneezing in the past 24 hours, has it worsened?  X   EXPOSURES - check yes or no X   Have you traveled outside the state in the past 14 days?  X   Have you been in contact with someone with a confirmed diagnosis of COVID-19 or PUI in the past 14 days without wearing appropriate PPE?  X   Have you been living in the same home as a person with confirmed diagnosis of COVID-19 or a PUI (household contact)?    X   Have you been diagnosed with COVID-19?    X              What to do next: Answered NO to all: Answered YES to anything:   Proceed with unit schedule Follow the [facility] Inpatient Flowsheet.

## 2018-11-11 LAB — HEMOGLOBIN A1C
Hgb A1c MFr Bld: 5.5 % (ref 4.8–5.6)
Mean Plasma Glucose: 111 mg/dL

## 2018-11-11 NOTE — [facility] Group Notes (Signed)
[facility] Group Notes: (Clinical Social Work)   11/11/2018      Type of Therapy:  Group Therapy   Participation Level:  Did Not Attend - was invited both individually by [facility] and by overhead announcement, chose not to attend.   Selmer Dominion, LCSW 11/11/2018, 1:05 PM

## 2018-11-11 NOTE — Progress Notes (Signed)
The focus of this group is to help patients establish daily goals to achieve during treatment and discuss how the patient can incorporate goal setting into their daily lives to aide in recovery. 

## 2018-11-11 NOTE — Progress Notes (Addendum)
[facility] MD Progress Note  11/11/2018 2:25 PM Carl Carey  MRN:  [id number]   Subjective:  Carl Carey reported " I am from [place] and I came to see my mother here."  Evaluation: Aquil observed responding to internal stimuli.  He presents pressured guarded and disheveled.  Was reported by staff patient is declining all medications at this time.  Patient has multiple complaints related to headaches, nausea and leg pain.  However is refusing medication to aid with symptoms.  Discussed decreasing medications as he reports dizziness while taking medication.  Patient restated " I will take 1 pill at a time."  As charted by nursing staff patient continues to refuse medication.  He denies suicidal or homicidal ideations.  Denies auditory or visual hallucinations.  Patient reports being homeless.  And is tangential and disorganized.  Reports a decreased appetite.  Reports resting okay throughout the night orders placed for Ensure nutritional supplement.  Support, encouragement and reassurance was provided.   Principal Problem: Bipolar 1 disorder ([place]) Diagnosis: Principal Problem:   Bipolar 1 disorder ([place])  Total Time spent with patient: 15 minutes  Past Psychiatric History:   Past Medical History:  Past Medical History:  Diagnosis Date  . Panic attack     Past Surgical History:  Procedure Laterality Date  . MULTIPLE EXTRACTIONS WITH ALVEOLOPLASTY N/A 07/29/2014   Procedure: Extraction of tooth #'s 1,15,16,17,18 with alveoloplasty and gross debridement of teeth;  Surgeon: Lenn Cal, DDS;  Location: [facility];  Service: Oral Surgery;  Laterality: N/A;  . NECK SURGERY     Family History:  Family History  Problem Relation Age of Onset  . Hypertension Maternal Grandmother    Family Psychiatric  History:  Social History:  Social History   Substance and Sexual Activity  Alcohol Use Yes     Social History   Substance and Sexual Activity  Drug Use No    Social History   Socioeconomic  History  . Marital status: Single    Spouse name: Not on file  . Number of children: Not on file  . Years of education: Not on file  . Highest education level: Not on file  Occupational History  . Not on file  Social Needs  . Financial resource strain: Not on file  . Food insecurity    Worry: Not on file    Inability: Not on file  . Transportation needs    Medical: Not on file    Non-medical: Not on file  Tobacco Use  . Smoking status: Never Smoker  . Smokeless tobacco: Never Used  Substance and Sexual Activity  . Alcohol use: Yes  . Drug use: No  . Sexual activity: Not on file  Lifestyle  . Physical activity    Days per week: Not on file    Minutes per session: Not on file  . Stress: Not on file  Relationships  . Social Herbalist on phone: Not on file    Gets together: Not on file    Attends religious service: Not on file    Active member of club or organization: Not on file    Attends meetings of clubs or organizations: Not on file    Relationship status: Not on file  Other Topics Concern  . Not on file  Social History Narrative  . Not on file   Additional Social History:  Sleep: Fair  Appetite:  Fair  Current Medications: Current Facility-Administered Medications  Medication Dose Route Frequency Provider Last Rate Last Dose  . acetaminophen (TYLENOL) tablet 650 mg  650 mg Oral Q6H PRN Mordecai Maes, NP   650 mg at 11/11/18 0842  . alum & mag hydroxide-simeth (MAALOX/MYLANTA) 200-200-20 MG/5ML suspension 30 mL  30 mL Oral Q4H PRN Mordecai Maes, NP      . benztropine (COGENTIN) tablet 1 mg  1 mg Oral BID Johnn Hai, MD   1 mg at 11/11/18 0842  . diphenhydrAMINE (BENADRYL) capsule 50 mg  50 mg Oral Q6H PRN Mordecai Maes, NP       Or  . diphenhydrAMINE (BENADRYL) injection 50 mg  50 mg Intramuscular Q6H PRN Mordecai Maes, NP      . haloperidol (HALDOL) tablet 5 mg  5 mg Oral Q6H PRN Mordecai Maes, NP        Or  . haloperidol lactate (HALDOL) injection 5 mg  5 mg Intramuscular Q6H PRN Mordecai Maes, NP      . haloperidol (HALDOL) tablet 5 mg  5 mg Oral TID Johnn Hai, MD   5 mg at 11/11/18 1039  . LORazepam (ATIVAN) tablet 2 mg  2 mg Oral Q6H PRN Mordecai Maes, NP       Or  . LORazepam (ATIVAN) injection 2 mg  2 mg Intramuscular Q6H PRN Mordecai Maes, NP      . magnesium hydroxide (MILK OF MAGNESIA) suspension 30 mL  30 mL Oral Daily PRN Mordecai Maes, NP      . temazepam (RESTORIL) capsule 30 mg  30 mg Oral QHS Johnn Hai, MD      . traZODone (DESYREL) tablet 50 mg  50 mg Oral QHS,MR X 1 Lindon Romp A, NP   50 mg at 11/09/18 2120    Lab Results:  Results for orders placed or performed during the hospital encounter of 11/09/18 (from the past 48 hour(s))  Hemoglobin A1c     Status: None   Collection Time: 11/10/18  6:40 AM  Result Value Ref Range   Hgb A1c MFr Bld 5.5 4.8 - 5.6 %    Comment: (NOTE)         Prediabetes: 5.7 - 6.4         Diabetes: >6.4         Glycemic control for adults with diabetes: <7.0    Mean Plasma Glucose 111 mg/dL    Comment: (NOTE) Performed At: [facility] [place], [place] [id number] Rush Farmer MD [id number]   Lipid panel     Status: None   Collection Time: 11/10/18  6:40 AM  Result Value Ref Range   Cholesterol 132 0 - 200 mg/dL   Triglycerides 75 <150 mg/dL   HDL 47 >40 mg/dL   Total CHOL/HDL Ratio 2.8 RATIO   VLDL 15 0 - 40 mg/dL   LDL Cholesterol 70 0 - 99 mg/dL    Comment:        Total Cholesterol/HDL:CHD Risk Coronary Heart Disease Risk Table                     Men   Women  1/2 Average Risk   3.4   3.3  Average Risk       5.0   4.4  2 X Average Risk   9.6   7.1  3 X Average Risk  23.4   11.0        Use  the calculated Patient Ratio above and the CHD Risk Table to determine the patient's CHD Risk.        ATP III CLASSIFICATION (LDL):  <100     mg/dL   Optimal  100-129  mg/dL   Near or  Above                    Optimal  130-159  mg/dL   Borderline  160-189  mg/dL   High  >190     mg/dL   Very High Performed at [facility] [address], [place], [place] [address]   TSH     Status: None   Collection Time: 11/10/18  6:40 AM  Result Value Ref Range   TSH 2.039 0.350 - 4.500 uIU/mL    Comment: Performed by a 3rd Generation assay with a functional sensitivity of <=0.01 uIU/mL. Performed at [facility], [place] [address] [place], [place] [address]     Blood Alcohol level:  Lab Results  Component Value Date   ETH <10 11/07/2018   ETH <10 76/19/5093    Metabolic Disorder Labs: Lab Results  Component Value Date   HGBA1C 5.5 11/10/2018   MPG 111 11/10/2018   No results found for: PROLACTIN Lab Results  Component Value Date   CHOL 132 11/10/2018   TRIG 75 11/10/2018   HDL 47 11/10/2018   CHOLHDL 2.8 11/10/2018   VLDL 15 11/10/2018   LDLCALC 70 11/10/2018    Physical Findings: AIMS: Facial and Oral Movements Muscles of Facial Expression: None, normal Lips and Perioral Area: None, normal Jaw: None, normal Tongue: None, normal,Extremity Movements Upper (arms, wrists, hands, fingers): None, normal Lower (legs, knees, ankles, toes): None, normal, Trunk Movements Neck, shoulders, hips: None, normal, Overall Severity Severity of abnormal movements (highest score from questions above): None, normal Incapacitation due to abnormal movements: None, normal Patient's awareness of abnormal movements (rate only patient's report): No Awareness, Dental Status Current problems with teeth and/or dentures?: No Does patient usually wear dentures?: No  CIWA:  CIWA-Ar Total: 0 COWS:  COWS Total Score: 0  Musculoskeletal: Strength & Muscle Tone: within normal limits Gait & Station: normal Patient leans: N/A  Psychiatric Specialty Exam: Physical Exam  ROS  Blood pressure 109/60, pulse 67, temperature 98.2 F (36.8 C), temperature  source Oral, resp. rate 18, height 5' 8"  (1.727 m), weight 63.5 kg, SpO2 98 %.Body mass index is 21.29 kg/m.  General Appearance: Disheveled  Eye Contact:  Fair  Speech:  Pressured  Volume:  Normal with fluctuations  Mood:  Anxious, Depressed, Dysphoric and Irritable  Affect:  Congruent  Thought Process:  Linear  Orientation:  Full (Time, Place, and Person)  Thought Content:  Hallucinations: Auditory, Paranoid Ideation and Rumination  Suicidal Thoughts:  No  Homicidal Thoughts:  No  Memory:  Immediate;   Fair Recent;   Fair  Judgement:  Fair  Insight:  Lacking  Psychomotor Activity:  Normal  Concentration:  Concentration: Fair  Recall:  [employer] of Knowledge:  Fair  Language:  Fair  Akathisia:  No  Handed:  Right  AIMS (if indicated):     Assets:  Communication Skills Desire for Improvement Resilience Social Support  ADL's:  Intact  Cognition:  WNL  Sleep:  Number of Hours: 6     Treatment Plan Summary: Daily contact with patient to assess and evaluate symptoms and progress in treatment and Medication management   Continue her current treatment plan on 11/11/2018  as listed below except were noted  Bipolar disorder  Continue Haldol 5 mg p.o. 3 times daily Continue Restoril 30 mg p.o. nightly Agitation protocol see chart  CSW to continue working on discharge disposition Patient encouraged to participate within the therapeutic milieu   Derrill [place], NP 11/11/2018, 2:25 PM   Attest to NP Progress Note

## 2018-11-11 NOTE — Progress Notes (Signed)
D:  Carl Carey has been asleep all shift.  Breathing even and unlabored and appears to be in no physical distress.   A:  1:1 with RN for support and encouragement.  Q 15 minute checks maintained for safety.  Encouraged participation in group and unit activities.   R:  Carl Carey remains safe on the unit.  We will continue to monitor the progress towards his goals.

## 2018-11-11 NOTE — B[facility] Counselor (Signed)
Adult Comprehensive Assessment  Patient ID: Carl Carey, male   DOB: November 09, 1980, 38 y.o.   MRN: [id number]  Information Source: Information source: Patient  Current Stressors:  Patient states their primary concerns and needs for treatment are:: Rashes and eye problems, pain, dizziness, scab on leg Patient states their goals for this hospitilization and ongoing recovery are:: Get better so I can move around. Educational / Learning stressors: Denies stressors Employment / Job issues: On disability Family Relationships: Denies stressors but has some conflict. Financial / Lack of resources (include bankruptcy): Denies stressors Housing / Lack of housing: "Eat each other's food" Physical health (include injuries & life threatening diseases): Pretty stressful - see above Social relationships: Denies stressors - no friendships Substance abuse: Denies stressors Bereavement / Loss: Denies stressors  Living/Environment/Situation:  Living Arrangements: Parent, Other relatives(While pt says he lives with mother and brother, mother was contacted by TTS and said he is able to come live there with her, but chooses instead to be homeless.) Living conditions (as described by patient or guardian): Stays in the living room on the couch Who else lives in the home?: Mother, brother How long has patient lived in current situation?: 1 year What is atmosphere in current home: Comfortable  Family History:  Marital status: Single Does patient have children?: No  Childhood History:  By whom was/is the patient raised?: Mother, Grandparents Additional childhood history information: Grandparents from 7-11yo.  Mother until 37yo. Does patient have siblings?: Yes Number of Siblings: 2 Description of patient's current relationship with siblings: Brother, sister - not close Did patient suffer any verbal/emotional/physical/sexual abuse as a child?: No Did patient suffer from severe childhood neglect?: No Has  patient ever been sexually abused/assaulted/raped as an adolescent or adult?: No Was the patient ever a victim of a crime or a disaster?: No Witnessed domestic violence?: No Has patient been effected by domestic violence as an adult?: No  Education:  Highest grade of school patient has completed: 9th grade - talks about his suspensions Currently a student?: No Learning disability?: No  Employment/Work Situation:   Employment situation: On disability Why is patient on disability: talks about his head and shoulders and other medical issues How long has patient been on disability: 3 years Did You Receive Any Psychiatric Treatment/Services While in the [employer]?: (No Economist)  Pensions consultant:   Museum/gallery curator resources: Armed forces training and education officer, Medicaid Does patient have a Programmer, applications or guardian?: No  Alcohol/Substance Abuse:   What has been your use of drugs/alcohol within the last 12 months?: Alcohol monthly Alcohol/Substance Abuse Treatment Hx: Denies past history Has alcohol/substance abuse ever caused legal problems?: No  Social Support System:   Heritage manager System: None Type of faith/religion: Does not go to church  Leisure/Recreation:      Strengths/Needs:   What is the patient's perception of their strengths?: My left leg Patient states these barriers may affect/interfere with their treatment: None Patient states these barriers may affect their return to the community: None Other important information patient would like considered in planning for their treatment: None  Discharge Plan:   Currently receiving community mental health services: Yes (From Whom)(Dr. Marcelline Deist) Patient states concerns and preferences for aftercare planning are: Wants a doctor's appointment set up. Patient states they will know when they are safe and ready for discharge when: "I might feel welcome at certain locations." Does patient have access to transportation?: No Does  patient have financial barriers related to discharge medications?: No Plan for no access to transportation  at discharge: Plans to walk (is homeless, but states he lives with his mother in [place]) Will patient be returning to same living situation after discharge?: Yes  Summary/Recommendations:   Summary and Recommendations (to be completed by the evaluator): Patient is a 38yo male hospitalized with multiple somatic complaints, appearing to be in a manic state, hyperverbal, disorganized and tangential.  He also indicated that he feels and sees things crawling on his skin, believes he has cancer in his mouth.  TTS contacted patient's mother, Carl Carey [phone], who states that patient has no history of mental illness and he has never tried to harm himself or others.  She states that the only thing that she knows that is wrong with him is Parkinson's.  Mother states that she lives in [place] and states that patient can stay with her, but he chooses to live on the streets. She states that he has always talked fast and hard to follow at times, but states that he "eventually slows down."  He reports drinking alcohol occasionally.  He would benefit from crisis stabilization, group therapy, psychoeducation, medication assessment, peer supports, and discharge planning.  It is recommended that he adhere to the medications and follow-up plans at discharge.  Maretta Los. 11/11/2018

## 2018-11-11 NOTE — Progress Notes (Signed)
   11/11/18 0842  COVID-19 Daily Checkoff  Have you had a fever (temp > 37.80C/100F)  in the past 24 hours?  No  If you have had runny nose, nasal congestion, sneezing in the past 24 hours, has it worsened? No  COVID-19 EXPOSURE  Have you traveled outside the state in the past 14 days? No  Have you been in contact with someone with a confirmed diagnosis of COVID-19 or PUI in the past 14 days without wearing appropriate PPE? No  Have you been living in the same home as a person with confirmed diagnosis of COVID-19 or a PUI (household contact)? No  Have you been diagnosed with COVID-19? No

## 2018-11-11 NOTE — Progress Notes (Addendum)
D: Patient presents elevated, refusing psychiatric medications, somatically preoccupied. He has multiple somatic complaints. His speech is pressured, and mood elevated/agitated. He slept fair last night, and received medication to help him sleep. His appetite is poor, energy low and concentration poor. He rates his depression, hopelessness and anxiety 8.5/10. He complains of diarrhea and dizziness. He complains of cramps in shoulders, stomach cramps and pain in right eye. Patient denies SI/HI/AVH. He later agreed to take his haldol. A: Patient checked q15 min, and checks reviewed. Reviewed medication changes with patient and educated on side effects. Educated patient on importance of attending group therapy sessions and educated on several coping skills. Encouarged participation in milieu through recreation therapy and attending meals with peers. Support and encouragement provided. Fluids offered. R: Patient receptive to education on medications, and is medication compliant. Patient contracts for safety on the unit. His goal is "to get up and stand and to walk out to get lunch."

## 2018-11-12 NOTE — Progress Notes (Signed)
   11/12/18 0848  COVID-19 Daily Checkoff  Have you had a fever (temp > 37.80C/100F)  in the past 24 hours?  No  If you have had runny nose, nasal congestion, sneezing in the past 24 hours, has it worsened? No  COVID-19 EXPOSURE  Have you traveled outside the state in the past 14 days? No  Have you been in contact with someone with a confirmed diagnosis of COVID-19 or PUI in the past 14 days without wearing appropriate PPE? No  Have you been living in the same home as a person with confirmed diagnosis of COVID-19 or a PUI (household contact)? No  Have you been diagnosed with COVID-19? No

## 2018-11-12 NOTE — Progress Notes (Signed)
D: Patient isolates to room. When assessed, he has pressured, rapid speech and can be difficult to understand. He refused morning haldol and cogentin because "it makes me too drowsy". He has been napping frequently during the day. He denies SI and AVH.  A: Provided support and encouragement. Fluids offered. Educated on medications and side effects.  R: Patient resting calmly in bed.

## 2018-11-12 NOTE — [facility] Group Notes (Signed)
[facility] Group Notes: (Clinical Social Work)   11/12/2018      Type of Therapy:  Group Therapy   Participation Level:  Did Not Attend - was invited both individually by [facility] and by overhead announcement, chose not to attend.   Selmer Dominion, LCSW 11/12/2018, 11:33 AM

## 2018-11-12 NOTE — Progress Notes (Addendum)
[facility] MD Progress Note  11/12/2018 1:14 PM Carl Carey  MRN:  [id number]   Subjective:  Briston reported " I came to the hospital for pain medication and now ready to leave."   Evaluation: Carl Carey seen resting in bedroom.  He is awake,alert and oriented to person and place.  Patient is requesting to be discharged today as he states he has a dermatology appointment on 11/13/2018.  Patient continues to need much encouragement with taking prescribed medication however was reported that patient is taking medications today.  He denies suicidal or homicidal ideations.  Denies auditory or visual hallucinations.  Patient continues to present with pressures rambled speech.  Patient is disheveled and malodorous.  Reports a good appetite.  States he is resting well throughout the night.  Support, reassurance and encouragement was provided.   Principal Problem: Bipolar 1 disorder ([place]) Diagnosis: Principal Problem:   Bipolar 1 disorder ([place])  Total Time spent with patient: 15 minutes  Past Psychiatric History:   Past Medical History:  Past Medical History:  Diagnosis Date  . Panic attack     Past Surgical History:  Procedure Laterality Date  . MULTIPLE EXTRACTIONS WITH ALVEOLOPLASTY N/A 07/29/2014   Procedure: Extraction of tooth #'s 1,15,16,17,18 with alveoloplasty and gross debridement of teeth;  Surgeon: Lenn Cal, DDS;  Location: [place];  Service: Oral Surgery;  Laterality: N/A;  . NECK SURGERY     Family History:  Family History  Problem Relation Age of Onset  . Hypertension Maternal Grandmother    Family Psychiatric  History:  Social History:  Social History   Substance and Sexual Activity  Alcohol Use Yes     Social History   Substance and Sexual Activity  Drug Use No    Social History   Socioeconomic History  . Marital status: Single    Spouse name: Not on file  . Number of children: Not on file  . Years of education: Not on file  . Highest education level: Not on  file  Occupational History  . Not on file  Social Needs  . Financial resource strain: Not on file  . Food insecurity    Worry: Not on file    Inability: Not on file  . Transportation needs    Medical: Not on file    Non-medical: Not on file  Tobacco Use  . Smoking status: Never Smoker  . Smokeless tobacco: Never Used  Substance and Sexual Activity  . Alcohol use: Yes  . Drug use: No  . Sexual activity: Not on file  Lifestyle  . Physical activity    Days per week: Not on file    Minutes per session: Not on file  . Stress: Not on file  Relationships  . Social Herbalist on phone: Not on file    Gets together: Not on file    Attends religious service: Not on file    Active member of club or organization: Not on file    Attends meetings of clubs or organizations: Not on file    Relationship status: Not on file  Other Topics Concern  . Not on file  Social History Narrative  . Not on file   Additional Social History:                         Sleep: Fair  Appetite:  Fair  Current Medications: Current Facility-Administered Medications  Medication Dose Route Frequency Provider Last Rate Last Dose  .  acetaminophen (TYLENOL) tablet 650 mg  650 mg Oral Q6H PRN Mordecai Maes, NP   650 mg at 11/11/18 0842  . alum & mag hydroxide-simeth (MAALOX/MYLANTA) 200-200-20 MG/5ML suspension 30 mL  30 mL Oral Q4H PRN Mordecai Maes, NP      . benztropine (COGENTIN) tablet 1 mg  1 mg Oral BID Johnn Hai, MD   1 mg at 11/11/18 1653  . diphenhydrAMINE (BENADRYL) capsule 50 mg  50 mg Oral Q6H PRN Mordecai Maes, NP       Or  . diphenhydrAMINE (BENADRYL) injection 50 mg  50 mg Intramuscular Q6H PRN Mordecai Maes, NP      . haloperidol (HALDOL) tablet 5 mg  5 mg Oral Q6H PRN Mordecai Maes, NP       Or  . haloperidol lactate (HALDOL) injection 5 mg  5 mg Intramuscular Q6H PRN Mordecai Maes, NP      . haloperidol (HALDOL) tablet 5 mg  5 mg Oral TID Johnn Hai, MD   5 mg at 11/12/18 1211  . LORazepam (ATIVAN) tablet 2 mg  2 mg Oral Q6H PRN Mordecai Maes, NP   2 mg at 11/11/18 2120   Or  . LORazepam (ATIVAN) injection 2 mg  2 mg Intramuscular Q6H PRN Mordecai Maes, NP      . magnesium hydroxide (MILK OF MAGNESIA) suspension 30 mL  30 mL Oral Daily PRN Mordecai Maes, NP      . temazepam (RESTORIL) capsule 30 mg  30 mg Oral QHS Johnn Hai, MD   30 mg at 11/11/18 2120  . traZODone (DESYREL) tablet 50 mg  50 mg Oral QHS,MR X 1 Lindon Romp A, NP   50 mg at 11/11/18 2120    Lab Results:  No results found for this or any previous visit (from the past 48 hour(s)).  Blood Alcohol level:  Lab Results  Component Value Date   ETH <10 11/07/2018   ETH <10 48/18/5631    Metabolic Disorder Labs: Lab Results  Component Value Date   HGBA1C 5.5 11/10/2018   MPG 111 11/10/2018   No results found for: PROLACTIN Lab Results  Component Value Date   CHOL 132 11/10/2018   TRIG 75 11/10/2018   HDL 47 11/10/2018   CHOLHDL 2.8 11/10/2018   VLDL 15 11/10/2018   LDLCALC 70 11/10/2018    Physical Findings: AIMS: Facial and Oral Movements Muscles of Facial Expression: None, normal Lips and Perioral Area: None, normal Jaw: None, normal Tongue: None, normal,Extremity Movements Upper (arms, wrists, hands, fingers): None, normal Lower (legs, knees, ankles, toes): None, normal, Trunk Movements Neck, shoulders, hips: None, normal, Overall Severity Severity of abnormal movements (highest score from questions above): None, normal Incapacitation due to abnormal movements: None, normal Patient's awareness of abnormal movements (rate only patient's report): No Awareness, Dental Status Current problems with teeth and/or dentures?: No Does patient usually wear dentures?: No  CIWA:  CIWA-Ar Total: 0 COWS:  COWS Total Score: 0  Musculoskeletal: Strength & Muscle Tone: within normal limits Gait & Station: normal Patient leans: N/A  Psychiatric  Specialty Exam: Physical Exam  Nursing note and vitals reviewed.   Review of Systems  Psychiatric/Behavioral: Positive for hallucinations. Negative for suicidal ideas. The patient is nervous/anxious.   All other systems reviewed and are negative.   Blood pressure 109/60, pulse 67, temperature 98.2 F (36.8 C), temperature source Oral, resp. rate 18, height 5' 8"  (1.727 m), weight 63.5 kg, SpO2 98 %.Body mass index is 21.29 kg/m.  General  Appearance: Disheveled  Eye Contact:  Fair  Speech:  Pressured  Volume:  Normal with fluctuations  Mood:  Anxious, Depressed, Dysphoric and Irritable  Affect:  Congruent  Thought Process:  Linear  Orientation:  Full (Time, Place, and Person)  Thought Content:  Hallucinations: Auditory, Paranoid Ideation and Rumination  Suicidal Thoughts:  No  Homicidal Thoughts:  No  Memory:  Immediate;   Fair Recent;   Fair  Judgement:  Fair  Insight:  Lacking  Psychomotor Activity:  Normal  Concentration:  Concentration: Fair  Recall:  [employer] of Knowledge:  Fair  Language:  Fair  Akathisia:  No  Handed:  Right  AIMS (if indicated):     Assets:  Communication Skills Desire for Improvement Resilience Social Support  ADL's:  Intact  Cognition:  WNL  Sleep:  Number of Hours: 6.3     Treatment Plan Summary: Daily contact with patient to assess and evaluate symptoms and progress in treatment and Medication management   Continue her current treatment plan on 11/12/2018 as listed below except were noted  Bipolar disorder  Continue Haldol 5 mg p.o. 3 times daily Continue Restoril 30 mg p.o. nightly Agitation protocol see chart  CSW to continue working on discharge disposition Patient encouraged to participate within the therapeutic milieu   Derrill [place], NP 11/12/2018, 1:14 PM   Attest to NP Progress Note

## 2018-11-12 NOTE — Progress Notes (Signed)
DAR NOTE: Patient presents with anxious affect and depressed mood, pressured speech, poor hygrine and body odor. Pt complained of hearing voices and has been observed responding to internal stimuli. Denies pain and visual hallucinations.  Maintained on routine safety checks.  Medications given as prescribed.  Support and encouragement offered as needed.  Will continue to monitor.

## 2018-11-12 NOTE — Progress Notes (Signed)
D: Patient refused evening dose of haldol and cogentin. "[name]" was his only response.

## 2018-11-12 NOTE — Progress Notes (Signed)
D: Pt denies SI/HI/AVH. Pt stated he was ok. Better. Pt paranoid on the unit this evening. Pt had minimal interaction on the milieu, pt guarded and forwards little this evening.   A: Pt was offered support and encouragement. Pt was given scheduled medications. Pt was encourage to attend groups. Q 15 minute checks were done for safety.   R:Pt attends groups and interacts well with peers and staff. Pt is taking medication. Pt receptive to treatment and safety maintained on unit.

## 2018-11-13 MED ORDER — HALOPERIDOL DECANOATE 100 MG/ML IM SOLN
100.0000 mg | INTRAMUSCULAR | Status: DC
  Administered 2018-11-13: 100 mg via INTRAMUSCULAR
  Filled 2018-11-13: qty 1

## 2018-11-13 MED ORDER — BENZTROPINE MESYLATE 1 MG PO TABS: 1.0000 mg | ORAL_TABLET | Freq: Two times a day (BID) | ORAL | 2 refills | Status: DC

## 2018-11-13 MED ORDER — HALOPERIDOL 5 MG PO TABS: 15.0000 mg | ORAL_TABLET | Freq: Every day | ORAL | 2 refills | Status: DC

## 2018-11-13 MED ORDER — HALOPERIDOL DECANOATE 100 MG/ML IM SOLN: 100.0000 mg | INTRAMUSCULAR | 11 refills | Status: DC

## 2018-11-13 MED ORDER — TRAZODONE HCL 150 MG PO TABS: 150.0000 mg | ORAL_TABLET | Freq: Every evening | ORAL | 1 refills | Status: DC | PRN

## 2018-11-13 NOTE — Tx Team (Signed)
Interdisciplinary Treatment and Diagnostic Plan Update  11/13/2018 Time of Session: 11:02am Carl Carey MRN: [id number]  Principal Diagnosis: Bipolar 1 disorder [facility])  Secondary Diagnoses: Principal Problem:   Bipolar 1 disorder ([place])   Current Medications:  Current Facility-Administered Medications  Medication Dose Route Frequency Provider Last Rate Last Dose  . acetaminophen (TYLENOL) tablet 650 mg  650 mg Oral Q6H PRN Mordecai Maes, NP   650 mg at 11/11/18 0842  . alum & mag hydroxide-simeth (MAALOX/MYLANTA) 200-200-20 MG/5ML suspension 30 mL  30 mL Oral Q4H PRN Mordecai Maes, NP      . benztropine (COGENTIN) tablet 1 mg  1 mg Oral BID Johnn Hai, MD   1 mg at 11/13/18 0814  . diphenhydrAMINE (BENADRYL) capsule 50 mg  50 mg Oral Q6H PRN Mordecai Maes, NP       Or  . diphenhydrAMINE (BENADRYL) injection 50 mg  50 mg Intramuscular Q6H PRN Mordecai Maes, NP      . haloperidol (HALDOL) tablet 5 mg  5 mg Oral Q6H PRN Mordecai Maes, NP       Or  . haloperidol lactate (HALDOL) injection 5 mg  5 mg Intramuscular Q6H PRN Mordecai Maes, NP      . haloperidol (HALDOL) tablet 5 mg  5 mg Oral TID Johnn Hai, MD   5 mg at 11/13/18 6010  . haloperidol decanoate (HALDOL DECANOATE) 100 MG/ML injection 100 mg  100 mg Intramuscular Q30 days Johnn Hai, MD   100 mg at 11/13/18 9323  . LORazepam (ATIVAN) tablet 2 mg  2 mg Oral Q6H PRN Mordecai Maes, NP   2 mg at 11/11/18 2120   Or  . LORazepam (ATIVAN) injection 2 mg  2 mg Intramuscular Q6H PRN Mordecai Maes, NP      . magnesium hydroxide (MILK OF MAGNESIA) suspension 30 mL  30 mL Oral Daily PRN Mordecai Maes, NP      . temazepam (RESTORIL) capsule 30 mg  30 mg Oral QHS Johnn Hai, MD   30 mg at 11/12/18 2136  . traZODone (DESYREL) tablet 50 mg  50 mg Oral QHS,MR X 1 Lindon Romp A, NP   50 mg at 11/11/18 2120   PTA Medications: No medications prior to admission.    Patient Stressors:    Patient Strengths:     Treatment Modalities: Medication Management, Group therapy, Case management,  1 to 1 session with clinician, Psychoeducation, Recreational therapy.   Physician Treatment Plan for Primary Diagnosis: Bipolar 1 disorder ([place]) Long Term Goal(s): Improvement in symptoms so as ready for discharge Improvement in symptoms so as ready for discharge   Short Term Goals: Ability to disclose and discuss suicidal ideas Ability to demonstrate self-control will improve Ability to identify and develop effective coping behaviors will improve Ability to identify and develop effective coping behaviors will improve Ability to maintain clinical measurements within normal limits will improve Compliance with prescribed medications will improve  Medication Management: Evaluate patient's response, side effects, and tolerance of medication regimen.  Therapeutic Interventions: 1 to 1 sessions, Unit Group sessions and Medication administration.  Evaluation of Outcomes: Adequate for Discharge  Physician Treatment Plan for Secondary Diagnosis: Principal Problem:   Bipolar 1 disorder ([place])  Long Term Goal(s): Improvement in symptoms so as ready for discharge Improvement in symptoms so as ready for discharge   Short Term Goals: Ability to disclose and discuss suicidal ideas Ability to demonstrate self-control will improve Ability to identify and develop effective coping behaviors will improve Ability to identify and  develop effective coping behaviors will improve Ability to maintain clinical measurements within normal limits will improve Compliance with prescribed medications will improve     Medication Management: Evaluate patient's response, side effects, and tolerance of medication regimen.  Therapeutic Interventions: 1 to 1 sessions, Unit Group sessions and Medication administration.  Evaluation of Outcomes: Adequate for Discharge   RN Treatment Plan for Primary Diagnosis: Bipolar 1 disorder  ([place]) Long Term Goal(s): Knowledge of disease and therapeutic regimen to maintain health will improve  Short Term Goals: Ability to participate in decision making will improve, Ability to verbalize feelings will improve, Ability to disclose and discuss suicidal ideas, Ability to identify and develop effective coping behaviors will improve and Compliance with prescribed medications will improve  Medication Management: RN will administer medications as ordered by provider, will assess and evaluate patient's response and provide education to patient for prescribed medication. RN will report any adverse and/or side effects to prescribing provider.  Therapeutic Interventions: 1 on 1 counseling sessions, Psychoeducation, Medication administration, Evaluate responses to treatment, Monitor vital signs and CBGs as ordered, Perform/monitor CIWA, COWS, AIMS and Fall Risk screenings as ordered, Perform wound care treatments as ordered.  Evaluation of Outcomes: Adequate for Discharge   LCSW Treatment Plan for Primary Diagnosis: Bipolar 1 disorder ([place]) Long Term Goal(s): Safe transition to appropriate next level of care at discharge, Engage patient in therapeutic group addressing interpersonal concerns.  Short Term Goals: Engage patient in aftercare planning with referrals and resources and Increase skills for wellness and recovery  Therapeutic Interventions: Assess for all discharge needs, 1 to 1 time with Social worker, Explore available resources and support systems, Assess for adequacy in community support network, Educate family and significant other(s) on suicide prevention, Complete Psychosocial Assessment, Interpersonal group therapy.  Evaluation of Outcomes: Adequate for Discharge   Progress in Treatment: Attending groups: No. Participating in groups: No. Taking medication as prescribed: Yes. Toleration medication: Yes. Family/Significant other contact made: Yes, individual(s) contacted:  pt's  mother Patient understands diagnosis: Yes. Discussing patient identified problems/goals with staff: Yes. Medical problems stabilized or resolved: Yes. Denies suicidal/homicidal ideation: Yes. Issues/concerns per patient self-inventory: No. Other:   New problem(s) identified: No, Describe:  None  New Short Term/Long Term Goal(s): Medication stabilization, elimination of SI thoughts, and development of a comprehensive mental wellness plan.   Patient Goals:    Discharge Plan or Barriers: [facility] and  and Wellness for PCP  Reason for Continuation of Hospitalization: Patient is discharging today.   Estimated Length of Stay: Patient is discharging today.   Attendees: Patient: 11/13/2018   Physician: Dr. Johnn Hai, MD 11/13/2018   Nursing: Sharl Ma, RN 11/13/2018   RN Care Manager: 11/13/2018  Social Worker: Ardelle Anton, LCSW 11/13/2018   Recreational Therapist:  11/13/2018   Other:  11/13/2018   Other:  11/13/2018   Other: 11/13/2018      Scribe for Treatment Team: Trecia Rogers, LCSW 11/13/2018 11:44 AM
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
## 2018-11-13 NOTE — Progress Notes (Signed)
Recreation Therapy Notes  Date: 7.13.20 Time: 1013 Location: 500 [address] Dayroom  Group Topic: Coping Skills  Goal Area(s) Addresses:  Patient will identify positive coping skills. Patient will identify benefits of using coping skills post d/c.  Behavioral Response:  Minimal  Intervention:  [employer], dry erase marker, worksheet  Activity:  Mind Map.  Patients and LRT filled out first 8 boxes together with anger, substance abuse, being sad, anxiety, verbal abuse, torture and finances.  Patients will then come up with at least three coping skills per situation.  LRT will then write the coping skills on the board so patience can fill in the blank spots on their worksheets.  Education: Radiographer, therapeutic, Dentist.   Education Outcome: Acknowledges understanding/In group clarification offered/Needs additional education.   Clinical Observations/Feedback:  Pt was quiet but engaged when prompted.  Pt identified a coping skill as watching a comedy or something to make you feel good.   Victorino Sparrow, LRT/CTRS         Ria Comment, Oleva Koo A 11/13/2018 11:01 AM

## 2018-11-13 NOTE — Progress Notes (Signed)
Pt received both written and verbal discharge instructions. Pt verbalized understanding of discharge instructions. Pt agreed to f/u appt and med regimen. Pt received SRA, AVS, transitional record and prescriptions. Pt received belongings from room and locker. Pt safely discharged to the lobby. Pt expressed catching the bus home.

## 2018-11-13 NOTE — Plan of Care (Signed)
  Problem: Education: Goal: Knowledge of [facility] Education information/materials will improve Outcome: Adequate for Discharge Goal: Emotional status will improve Outcome: Adequate for Discharge Goal: Mental status will improve Outcome: Adequate for Discharge Goal: Verbalization of understanding the information provided will improve Outcome: Adequate for Discharge   Problem: Activity: Goal: Interest or engagement in activities will improve Outcome: Adequate for Discharge Goal: Sleeping patterns will improve Outcome: Adequate for Discharge   Problem: Coping: Goal: Ability to verbalize frustrations and anger appropriately will improve Outcome: Adequate for Discharge Goal: Ability to demonstrate self-control will improve Outcome: Adequate for Discharge   Problem: Health Behavior/Discharge Planning: Goal: Identification of resources available to assist in meeting health care needs will improve Outcome: Adequate for Discharge Goal: Compliance with treatment plan for underlying cause of condition will improve Outcome: Adequate for Discharge

## 2018-11-13 NOTE — [facility] Suicide Risk Assessment (Signed)
[facility] INPATIENT:  Family/Significant Other Suicide Prevention Education  Suicide Prevention Education:  Education Completed; Pt's mother, Kinley Ferrentino, has been identified by the patient as the family member/significant other with whom the patient will be residing, and identified as the person(s) who will aid the patient in the event of a mental health crisis (suicidal ideations/suicide attempt).  With written consent from the patient, the family member/significant other has been provided the following suicide prevention education, prior to the and/or following the discharge of the patient.  The suicide prevention education provided includes the following:  Suicide risk factors  Suicide prevention and interventions  National Suicide Hotline telephone number  [facility] assessment telephone number  [facility] Emergency Assistance [place] and/or Residential Mobile Crisis Unit telephone number  Request made of family/significant other to:  Remove weapons (e.g., guns, rifles, knives), all items previously/currently identified as safety concern.    Remove drugs/medications (over-the-counter, prescriptions, illicit drugs), all items previously/currently identified as a safety concern.  The family member/significant other verbalizes understanding of the suicide prevention education information provided.  The family member/significant other agrees to remove the items of safety concern listed above.  CSW contacted pt's mother, Shafer Swamy. Pt's mother stated that she did not know that he was being discharged but she is okay with that. She stated that he will be coming back to live with her but she does not have transportation so he will need to take the bus home. Pt's mother denied having any questions or concerns.   Trecia Rogers 11/13/2018, 10:55 AM

## 2018-11-13 NOTE — Progress Notes (Signed)
  [facility] Adult Case Management Discharge Plan :  Will you be returning to the same living situation after discharge:  Yes,  with mom At discharge, do you have transportation home?: Yes,  Bus Do you have the ability to pay for your medications: Yes,  medicaid  Release of information consent forms completed and in the chart;  Patient's signature needed at discharge.  Patient to Follow up at: Follow-up [facility] Follow up on 12/05/2018.   Why: Primary care appointment is Tuesday, 8/4 ayt 3:30p.  Office will contact you prior to appt with how the appt will be conducted via virtual or telephone.  Contact information: [place] [phone] [place] Follow up on 11/15/2018.   Why: Telephonic hospital follow up appointment is Wednesday, 7/15 at 11:00a.  Provider will contact you.  Contact information: [address] [place] [address] [phone]           Next level of care provider has access to [place] and Suicide Prevention discussed: Yes,  pt's mother  Have you used any form of tobacco in the last 30 days? (Cigarettes, Smokeless Tobacco, Cigars, and/or Pipes): Yes  Has patient been referred to the Quitline?: Patient refused referral  Patient has been referred for addiction treatment: Yes  Trecia Rogers, LCSW 11/13/2018, 11:47 AM

## 2018-11-13 NOTE — [facility] Suicide Risk Assessment (Signed)
[facility] Discharge Suicide Risk Assessment   Principal Problem: Bipolar 1 disorder [facility]) Discharge Diagnoses: Principal Problem:   Bipolar 1 disorder ([place])   Total Time spent with patient: 45 minutes  Musculoskeletal: Strength & Muscle Tone: within normal limits Gait & Station: normal Patient leans: N/A  Psychiatric Specialty Exam: ROS  Blood pressure 109/60, pulse 67, temperature 98.2 F (36.8 C), temperature source Oral, resp. rate 18, height 5' 8"  (1.727 m), weight 63.5 kg, SpO2 98 %.Body mass index is 21.29 kg/m.  General Appearance: Casual  Eye Contact::  Good  Speech:  Clear and Coherent and but rapid  Volume:  Normal  Mood:  Euthymic  Affect:  Full Range  Thought Process:  Coherent, Goal Directed and Descriptions of Associations: Circumstantial  Orientation:  Full (Time, Place, and Person)  Thought Content:  Tangential  Suicidal Thoughts:  No  Homicidal Thoughts:  No  Memory: 3/3  Judgement:  Fair  Insight:  Fair  Psychomotor Activity:  Normal  Concentration:  Good  Recall:  Good  Fund of Knowledge:Good  Language: Good  Akathisia:  Negative  Handed:  Right  AIMS (if indicated):     Assets:  Communication Skills Desire for Improvement Physical Health Resilience  Sleep:  Number of Hours: 6.75  Cognition: WNL  ADL's:  Intact   Mental Status Per Nursing Assessment::   On Admission:  NA  Demographic Factors:  Male and Unemployed  Loss Factors: Decrease in vocational status  Historical Factors: NA  Risk Reduction Factors:   Sense of responsibility to family  Continued Clinical Symptoms:  Somatic focus without delusions Cognitive Features That Contribute To Risk:  Polarized thinking    Suicide Risk:  Minimal: No identifiable suicidal ideation.  Patients presenting with no risk factors but with morbid ruminations; may be classified as minimal risk based on the severity of the depressive symptoms    Plan Of Care/Follow-up recommendations:   Activity:  full  Cedar Roseman, MD 11/13/2018, 7:51 AM

## 2018-11-22 ENCOUNTER — Other Ambulatory Visit: Payer: Self-pay

## 2018-11-22 ENCOUNTER — Emergency Department (HOSPITAL_COMMUNITY)
Admission: EM | Admit: 2018-11-22 | Discharge: 2018-11-23 | Disposition: A | Discharge status: Other Acute Inpatient Hospital | Payer: Medicaid Other | Attending: Emergency Medicine | Admitting: Emergency Medicine

## 2018-11-22 ENCOUNTER — Encounter (HOSPITAL_COMMUNITY): Payer: Self-pay | Admitting: Emergency Medicine

## 2018-11-22 DIAGNOSIS — Z79899 Other long term (current) drug therapy: Secondary | ICD-10-CM | POA: Diagnosis not present

## 2018-11-22 DIAGNOSIS — Z590 Homelessness: Secondary | ICD-10-CM | POA: Insufficient documentation

## 2018-11-22 DIAGNOSIS — H5713 Ocular pain, bilateral: Secondary | ICD-10-CM | POA: Insufficient documentation

## 2018-11-22 DIAGNOSIS — F3010 Manic episode without psychotic symptoms, unspecified: Secondary | ICD-10-CM | POA: Diagnosis not present

## 2018-11-22 DIAGNOSIS — R531 Weakness: Secondary | ICD-10-CM | POA: Insufficient documentation

## 2018-11-22 DIAGNOSIS — F319 Bipolar disorder, unspecified: Secondary | ICD-10-CM | POA: Diagnosis not present

## 2018-11-22 DIAGNOSIS — R51 Headache: Secondary | ICD-10-CM | POA: Diagnosis present

## 2018-11-22 NOTE — ED Triage Notes (Signed)
Pt reports headache, swelling to R eye, toothache on L side x 1-2 years. Pt has rapid repetitive speech. A&O x4

## 2018-11-23 ENCOUNTER — Encounter (HOSPITAL_COMMUNITY): Payer: Self-pay | Admitting: Clinical

## 2018-11-23 LAB — SALICYLATE LEVEL: Salicylate Lvl: <7 mg/dL (ref 2.8–30.0)

## 2018-11-23 LAB — RAPID URINE DRUG SCREEN, HOSP PERFORMED
Amphetamines: NOT DETECTED
Barbiturates: NOT DETECTED
Benzodiazepines: NOT DETECTED
Cocaine: NOT DETECTED
Opiates: NOT DETECTED
Tetrahydrocannabinol: NOT DETECTED

## 2018-11-23 LAB — CBC WITH DIFFERENTIAL/PLATELET
Abs Immature Granulocytes: 0.02 10*3/uL (ref 0.00–0.07)
Basophils Absolute: 0.1 10*3/uL (ref 0.0–0.1)
Basophils Relative: 1 %
Eosinophils Absolute: 0.6 10*3/uL — ABNORMAL HIGH (ref 0.0–0.5)
Eosinophils Relative: 8 %
HCT: 40.3 % (ref 39.0–52.0)
Hemoglobin: 12.6 g/dL — ABNORMAL LOW (ref 13.0–17.0)
Immature Granulocytes: 0 %
Lymphocytes Relative: 13 %
Lymphs Abs: 0.9 10*3/uL (ref 0.7–4.0)
MCH: 25.9 pg — ABNORMAL LOW (ref 26.0–34.0)
MCHC: 31.3 g/dL (ref 30.0–36.0)
MCV: 82.8 fL (ref 80.0–100.0)
Monocytes Absolute: 0.8 10*3/uL (ref 0.1–1.0)
Monocytes Relative: 12 %
Neutro Abs: 4.4 10*3/uL (ref 1.7–7.7)
Neutrophils Relative %: 66 %
Platelets: 321 10*3/uL (ref 150–400)
RBC: 4.87 MIL/uL (ref 4.22–5.81)
RDW: 13.7 % (ref 11.5–15.5)
WBC: 6.6 10*3/uL (ref 4.0–10.5)
nRBC: 0 % (ref 0.0–0.2)

## 2018-11-23 LAB — COMPREHENSIVE METABOLIC PANEL
ALT: 21 U/L (ref 0–44)
AST: 29 U/L (ref 15–41)
Albumin: 3 g/dL — ABNORMAL LOW (ref 3.5–5.0)
Alkaline Phosphatase: 74 U/L (ref 38–126)
Anion gap: 9 (ref 5–15)
BUN: 5 mg/dL — ABNORMAL LOW (ref 6–20)
CO2: 27 mmol/L (ref 22–32)
Calcium: 8.8 mg/dL — ABNORMAL LOW (ref 8.9–10.3)
Chloride: 100 mmol/L (ref 98–111)
Creatinine, Ser: 0.92 mg/dL (ref 0.61–1.24)
GFR calc Af Amer: >60 mL/min (ref >60)
GFR calc non Af Amer: >60 mL/min (ref >60)
Glucose, Bld: 75 mg/dL (ref 70–99)
Potassium: 3.3 mmol/L — ABNORMAL LOW (ref 3.5–5.1)
Sodium: 136 mmol/L (ref 135–145)
Total Bilirubin: 0.4 mg/dL (ref 0.3–1.2)
Total Protein: 5.8 g/dL — ABNORMAL LOW (ref 6.5–8.1)

## 2018-11-23 LAB — URINALYSIS, ROUTINE W REFLEX MICROSCOPIC
Bilirubin Urine: NEGATIVE
Glucose, UA: NEGATIVE mg/dL
Hgb urine dipstick: NEGATIVE
Ketones, ur: NEGATIVE mg/dL
Leukocytes,Ua: NEGATIVE
Nitrite: NEGATIVE
Protein, ur: NEGATIVE mg/dL
Specific Gravity, Urine: 1.001 — ABNORMAL LOW (ref 1.005–1.030)
pH: 6 (ref 5.0–8.0)

## 2018-11-23 LAB — ACETAMINOPHEN LEVEL: Acetaminophen (Tylenol), Serum: <10 ug/mL — ABNORMAL LOW (ref 10–30)

## 2018-11-23 LAB — ETHANOL: Alcohol, Ethyl (B): <10 mg/dL (ref <10)

## 2018-11-23 MED ORDER — ACETAMINOPHEN 325 MG PO TABS
650.0000 mg | ORAL_TABLET | Freq: Once | ORAL | Status: AC
  Administered 2018-11-23: 650 mg via ORAL
  Filled 2018-11-23: qty 2

## 2018-11-23 NOTE — ED Notes (Signed)
Lunch tray ordered 

## 2018-11-23 NOTE — ED Notes (Signed)
The pt has numerus complaints form the top of his head to the bottom of heis feet for 3-4 days

## 2018-11-23 NOTE — ED Provider Notes (Signed)
[facility] transported pt to [facility].  Pt seen by me at discharge ambulates without difficulty   Sidney Ace 11/23/18 1939    Virgel Manifold, MD 11/24/18 [phone]

## 2018-11-23 NOTE — ED Notes (Signed)
Patient returned from CT

## 2018-11-23 NOTE — ED Notes (Addendum)
Error- no admission ED TO INPATIENT HANDOFF REPORT  ED Nurse Name and Phone #:  [phone]  S Name/Age/Gender Carl Carey 38 y.o. male Room/Bed: 093G/182X  Code Status   Code Status: Prior  Home/SNF/Other Home Patient oriented to: self, person, place, time Is this baseline? Yes   Triage Complete: Triage complete  Chief Complaint multi sx  Triage Note Pt reports headache, swelling to R eye, toothache on L side x 1-2 years. Pt has rapid repetitive speech. A&O x4   Allergies No Known Allergies  Level of Care/Admitting Diagnosis ED Disposition    None      B Medical/Surgery History Past Medical History:  Diagnosis Date  . Panic attack    Past Surgical History:  Procedure Laterality Date  . MULTIPLE EXTRACTIONS WITH ALVEOLOPLASTY N/A 07/29/2014   Procedure: Extraction of tooth #'s 1,15,16,17,18 with alveoloplasty and gross debridement of teeth;  Surgeon: Lenn Cal, DDS;  Location: [facility];  Service: Oral Surgery;  Laterality: N/A;  . NECK SURGERY       A IV Location/Drains/Wounds Patient Lines/Drains/Airways Status   Active Line/Drains/Airways    None          Intake/Output Last 24 hours No intake or output data in the 24 hours ending 11/23/18 1153  Labs/Imaging Results for orders placed or performed during the hospital encounter of 11/22/18 (from the past 48 hour(s))  Comprehensive metabolic panel     Status: Abnormal   Collection Time: 11/23/18  3:40 AM  Result Value Ref Range   Sodium 136 135 - 145 mmol/L   Potassium 3.3 (L) 3.5 - 5.1 mmol/L   Chloride 100 98 - 111 mmol/L   CO2 27 22 - 32 mmol/L   Glucose, Bld 75 70 - 99 mg/dL   BUN 5 (L) 6 - 20 mg/dL   Creatinine, Ser 0.92 0.61 - 1.24 mg/dL   Calcium 8.8 (L) 8.9 - 10.3 mg/dL   Total Protein 5.8 (L) 6.5 - 8.1 g/dL   Albumin 3.0 (L) 3.5 - 5.0 g/dL   AST 29 15 - 41 U/L   ALT 21 0 - 44 U/L   Alkaline Phosphatase 74 38 - 126 U/L   Total Bilirubin 0.4 0.3 - 1.2 mg/dL   GFR calc non Af Amer  >60 >60 mL/min   GFR calc Af Amer >60 >60 mL/min   Anion gap 9 5 - 15    Comment: Performed at [facility], [address], [place], [place] [address]  Ethanol     Status: None   Collection Time: 11/23/18  3:40 AM  Result Value Ref Range   Alcohol, Ethyl (B) <10 <10 mg/dL    Comment: (NOTE) Lowest detectable limit for serum alcohol is 10 mg/dL. For medical purposes only. Performed at [facility], [place] [address], [place], [place] [address]   CBC with Diff     Status: Abnormal   Collection Time: 11/23/18  3:40 AM  Result Value Ref Range   WBC 6.6 4.0 - 10.5 K/uL   RBC 4.87 4.22 - 5.81 MIL/uL   Hemoglobin 12.6 (L) 13.0 - 17.0 g/dL   HCT 40.3 39.0 - 52.0 %   MCV 82.8 80.0 - 100.0 fL   MCH 25.9 (L) 26.0 - 34.0 pg   MCHC 31.3 30.0 - 36.0 g/dL   RDW 13.7 11.5 - 15.5 %   Platelets 321 150 - 400 K/uL   nRBC 0.0 0.0 - 0.2 %   Neutrophils Relative % 66 %  Neutro Abs 4.4 1.7 - 7.7 K/uL   Lymphocytes Relative 13 %   Lymphs Abs 0.9 0.7 - 4.0 K/uL   Monocytes Relative 12 %   Monocytes Absolute 0.8 0.1 - 1.0 K/uL   Eosinophils Relative 8 %   Eosinophils Absolute 0.6 (H) 0.0 - 0.5 K/uL   Basophils Relative 1 %   Basophils Absolute 0.1 0.0 - 0.1 K/uL   Immature Granulocytes 0 %   Abs Immature Granulocytes 0.02 0.00 - 0.07 K/uL    Comment: Performed at [facility] [address], [place], [place] [address]  Acetaminophen level     Status: Abnormal   Collection Time: 11/23/18  3:40 AM  Result Value Ref Range   Acetaminophen (Tylenol), Serum <10 (L) 10 - 30 ug/mL    Comment: (NOTE) Therapeutic concentrations vary significantly. A range of 10-30 ug/mL  may be an effective concentration for many patients. However, some  are best treated at concentrations outside of this range. Acetaminophen concentrations >150 ug/mL at 4 hours after ingestion  and >50 ug/mL at 12 hours after ingestion are often associated with  toxic reactions. Performed at [facility],  [place] [address], [place], [place] [address]   Salicylate level     Status: None   Collection Time: 11/23/18  3:40 AM  Result Value Ref Range   Salicylate Lvl <5.0 2.8 - 30.0 mg/dL    Comment: Performed at [facility] [address], [place], [place] [address]  Urine rapid drug screen (hosp performed)     Status: None   Collection Time: 11/23/18  3:46 AM  Result Value Ref Range   Opiates NONE DETECTED NONE DETECTED   Cocaine NONE DETECTED NONE DETECTED   Benzodiazepines NONE DETECTED NONE DETECTED   Amphetamines NONE DETECTED NONE DETECTED   Tetrahydrocannabinol NONE DETECTED NONE DETECTED   Barbiturates NONE DETECTED NONE DETECTED    Comment: (NOTE) DRUG SCREEN FOR MEDICAL PURPOSES ONLY.  IF CONFIRMATION IS NEEDED FOR ANY PURPOSE, NOTIFY LAB WITHIN 5 DAYS. LOWEST DETECTABLE LIMITS FOR URINE DRUG SCREEN Drug Class                     Cutoff (ng/mL) Amphetamine and metabolites    1000 Barbiturate and metabolites    200 Benzodiazepine                 829 Tricyclics and metabolites     300 Opiates and metabolites        300 Cocaine and metabolites        300 THC                            50 Performed at [facility], [place] [address] [place], [place] [address]   Urinalysis, Routine w reflex microscopic     Status: Abnormal   Collection Time: 11/23/18  3:46 AM  Result Value Ref Range   Color, Urine COLORLESS (A) YELLOW   APPearance CLEAR CLEAR   Specific Gravity, Urine 1.001 (L) 1.005 - 1.030   pH 6.0 5.0 - 8.0   Glucose, UA NEGATIVE NEGATIVE mg/dL   Hgb urine dipstick NEGATIVE NEGATIVE   Bilirubin Urine NEGATIVE NEGATIVE   Ketones, ur NEGATIVE NEGATIVE mg/dL   Protein, ur NEGATIVE NEGATIVE mg/dL   Nitrite NEGATIVE NEGATIVE   Leukocytes,Ua NEGATIVE NEGATIVE    Comment: Performed at [facility] [address] [place],  [address]  No results found.  Pending Labs Unresulted Labs (From admission, onward)   None      Vitals/Pain Today's  Vitals   11/23/18 1045 11/23/18 1100 11/23/18 1106 11/23/18 1138  BP: 124/66 (!) 102/48  (!) 102/48  Pulse: 75 63  78  Resp:    18  Temp:    98.6 F (37 C)  TempSrc:    Oral  SpO2: 93% 95%  99%  PainSc:   6      Isolation Precautions No active isolations  Medications Medications  acetaminophen (TYLENOL) tablet 650 mg (650 mg Oral Given 11/23/18 0949)    Mobility walks Low fall risk   Focused Assessments Cardiac Assessment Handoff:  Cardiac Rhythm: Normal sinus rhythm Lab Results  Component Value Date   CKTOTAL 218 07/22/2014   TROPONINI <0.03 07/22/2014   No results found for: DDIMER Does the Patient currently have chest pain? No

## 2018-11-23 NOTE — Progress Notes (Signed)
Pt meets inpatient criteria per Mordecai Maes, NP. Referral information has been sent to the following hospitals for review:  [facility]     Disposition will continue to assist with inpatient placement needs.   Audree Camel, LCSW, [place] Disposition [facility]/TTS [phone]

## 2018-11-23 NOTE — BHH Counselor (Signed)
This clinician has made numerous attempts to reach [facility] ED RN to initiate tele-psych. TTS will stop clock on assessment and continue to try to reach RN for tele-psych.

## 2018-11-23 NOTE — ED Notes (Signed)
[facility] transportation called

## 2018-11-23 NOTE — ED Notes (Signed)
Dinner tray ordered.

## 2018-11-23 NOTE — ED Notes (Signed)
Pt unable to sign discharge signature.

## 2018-11-23 NOTE — BH Assessment (Signed)
Tele Assessment Note   Patient Name: Carl Carey MRN: [id number] Referring Physician: Leonette [facility] Location of Patient: [facility] ED Location of Provider: [facility] Department  Carl Carey is an 38 y.o. male presenting voluntarily to [facility] ED with numerous somatic complaints. Per EDP: "Carl Carey is a 38 y.o. male with a history of homelessness, parkinsonian features, bipolar 1 disorder who presents to the emergency department with a chief complaint of headache.  The patient reports that he has been having a right temporal headache for the last 11 months.  He states " there is swelling on right forehead into the right eye."  He reports that he has been feeling fatigued and weak all over which has been going on for several months."  Upon this clinician's exam patient is noted to have pressured, tangential speech. He is a poor historian due to [place].  He struggles to answer assessment questions appropriately. When asked why he is in the hospital patient rambles incoherently for the most part but also says "My brain is weak. I'm bipolar in my brain. I feel weak." Patient reports numerous somatic complaints including swelling and pain in his eye, a cyst, a chipped tooth, and diarrhea. Patient denies SI/HI/AVH but does endorse feeling paranoid. He denies any substance use and UDS is negative. Patient states he "has not slept in 2 or 3 years" and would like medication to help him sleep. Patient was discharged from [facility] in 11/2018. Patient gave verbal consent for TTS to contact his mother, Carl Carey.  Per collateral: Patient is diagnosed with Parkinson's disease and has no other mental illness. She reports that patient has been staying with her some recently. She reports patient has never been suicidal or homicidal and there is no family history of mental illness. She unable to offer anymore information.  Diagnosis: F31.2 Bipolar I, current episode manic, with psychotic features.   Past Medical  History:  Past Medical History:  Diagnosis Date  . Panic attack     Past Surgical History:  Procedure Laterality Date  . MULTIPLE EXTRACTIONS WITH ALVEOLOPLASTY N/A 07/29/2014   Procedure: Extraction of tooth #'s 1,15,16,17,18 with alveoloplasty and gross debridement of teeth;  Surgeon: Lenn Cal, DDS;  Location: [facility];  Service: Oral Surgery;  Laterality: N/A;  . NECK SURGERY      Family History:  Family History  Problem Relation Age of Onset  . Hypertension Maternal Grandmother     Social History:  reports that he has never smoked. He has never used smokeless tobacco. He reports current alcohol use. He reports that he does not use drugs.  Additional Social History:     CIWA: CIWA-Ar BP: (!) 102/48 Pulse Rate: 78 COWS:    Allergies: No Known Allergies  Home Medications: (Not in a hospital admission)   OB/GYN Status:  No LMP for male patient.  General Assessment Data Assessment unable to be completed: Yes Reason for not completing assessment: unable to reach RN Location of Assessment: [facility] ED TTS Assessment: In system Is this a Tele or Face-to-Face Assessment?: Tele Assessment Is this an Initial Assessment or a Re-assessment for this encounter?: Initial Assessment Patient Accompanied by:: N/A Language Other than English: No Living Arrangements: Homeless/Shelter What gender do you identify as?: Male Marital status: Single Maiden name: Thrun Pregnancy Status: No Living Arrangements: Parent, Other relatives Can pt return to current living arrangement?: Yes Admission Status: Voluntary Is patient capable of signing voluntary admission?: Yes Referral Source: Self/Family/Friend Insurance type: Medicaid  Crisis Care Plan Living Arrangements: Parent, Other relatives Legal Guardian: (self) Name of Psychiatrist: none Name of Therapist: none  Education Status Is patient currently in school?: No Highest grade of school patient has completed: 9th grade -  talks about his suspensions Is the patient employed, unemployed or receiving disability?: Receiving disability income  Risk to self with the past 6 months Suicidal Ideation: No Has patient been a risk to self within the past 6 months prior to admission? : No Suicidal Intent: No Has patient had any suicidal intent within the past 6 months prior to admission? : No Is patient at risk for suicide?: No Suicidal Plan?: No Has patient had any suicidal plan within the past 6 months prior to admission? : No Access to Means: No What has been your use of drugs/alcohol within the last 12 months?: denies Previous Attempts/Gestures: No How many times?: 0 Other Self Harm Risks: none noted Triggers for Past Attempts: None known Intentional Self Injurious Behavior: None Family Suicide History: No Recent stressful life event(s): (none noted) Persecutory voices/beliefs?: No Depression: No Depression Symptoms: Insomnia Substance abuse history and/or treatment for substance abuse?: No Suicide prevention information given to non-admitted patients: Not applicable  Risk to Others within the past 6 months Homicidal Ideation: No Does patient have any lifetime risk of violence toward others beyond the six months prior to admission? : No Thoughts of Harm to Others: No Current Homicidal Intent: No Current Homicidal Plan: No Access to Homicidal Means: No Identified Victim: none History of harm to others?: No Assessment of Violence: None Noted Does patient have access to weapons?: No Criminal Charges Pending?: No Does patient have a court date: No Is patient on probation?: No  Psychosis Hallucinations: Auditory Delusions: Persecutory, Unspecified, Somatic  Mental Status Report Appearance/Hygiene: Unremarkable Eye Contact: Fair Motor Activity: Freedom of movement Speech: Pressured, Tangential, Rapid Level of Consciousness: Alert Mood: Anxious Affect: Appropriate to circumstance Anxiety Level:  Moderate Thought Processes: Tangential Judgement: Impaired Orientation: Person, Place, Time, Situation Obsessive Compulsive Thoughts/Behaviors: None  Cognitive Functioning Concentration: Poor Memory: Recent Intact, Remote Intact Is patient IDD: No Insight: Poor Impulse Control: Poor Appetite: Good Have you had any weight changes? : No Change Sleep: Decreased Total Hours of Sleep: 0 Vegetative Symptoms: None  ADLScreening [facility] Assessment Services) Patient's cognitive ability adequate to safely complete daily activities?: Yes Patient able to express need for assistance with ADLs?: Yes Independently performs ADLs?: Yes (appropriate for developmental age)  Prior Inpatient Therapy Prior Inpatient Therapy: Yes Prior Therapy Dates: 11/2018 Prior Therapy Facilty/Provider(s): [facility] Reason for Treatment: manic  Prior Outpatient Therapy Prior Outpatient Therapy: No Does patient have an ACCT team?: No Does patient have Intensive In-House Services?  : No Does patient have [facility] services? : No Does patient have P4CC services?: No  ADL Screening (condition at time of admission) Patient's cognitive ability adequate to safely complete daily activities?: Yes Is the patient deaf or have difficulty hearing?: No Does the patient have difficulty seeing, even when wearing glasses/contacts?: No Does the patient have difficulty concentrating, remembering, or making decisions?: No Patient able to express need for assistance with ADLs?: Yes Does the patient have difficulty dressing or bathing?: No Independently performs ADLs?: Yes (appropriate for developmental age) Does the patient have difficulty walking or climbing stairs?: No Weakness of Legs: None Weakness of Arms/Hands: None  Home Assistive Devices/Equipment Home Assistive Devices/Equipment: None  Therapy Consults (therapy consults require a physician order) PT Evaluation Needed: No OT Evalulation Needed: No SLP Evaluation  Needed:  No Abuse/Neglect Assessment (Assessment to be complete while patient is alone) Physical Abuse: Denies Verbal Abuse: Denies Sexual Abuse: Denies Exploitation of patient/patient's resources: Denies Self-Neglect: Denies Values / Beliefs Cultural Requests During Hospitalization: None Spiritual Requests During Hospitalization: None Consults Spiritual Care Consult Needed: No Social Work Consult Needed: No Regulatory affairs officer (For Healthcare) Does Patient Have a Medical Advance Directive?: No Would patient like information on creating a medical advance directive?: No - Patient declined          Disposition: Mordecai Maes, NP recommends in patient treatment. Disposition Initial Assessment Completed for this Encounter: Yes  This service was provided via telemedicine using a 2-way, interactive audio and video technology.  Names of all persons participating in this telemedicine service and their role in this encounter. Name: [name] Role: patient  Name: Orvis Brill, LCSW Role: TTS  Name:  Role:   Name:  Role:     Orvis Brill 11/23/2018 12:21 PM

## 2018-11-23 NOTE — ED Provider Notes (Signed)
7:09 AM Handoff from [facility] PA-C at shift change.   H/o bipolar, homelessness. Recent [facility] admit. Currently very disorganized. Awaiting TTS consult.   BP 99/65   Pulse (!) 51   Temp 97.8 F (36.6 C) (Oral)   Resp 16   SpO2 100%   Plan: f/u on TTS reccs. Pt is medically cleared.   12:49 PM TTS reccs inpatient treatment. Pending placement.   BP (!) 102/48 (BP Location: Right Arm)   Pulse 78   Temp 98.6 F (37 C) (Oral)   Resp 18   SpO2 99%     Carlisle Cater, PA-C 11/23/18 1249    Quintella Reichert, MD 11/24/18 [phone]

## 2018-11-23 NOTE — ED Notes (Signed)
Breakfast tray ordered 

## 2018-11-23 NOTE — ED Notes (Signed)
Pt asleep.

## 2018-11-23 NOTE — ED Provider Notes (Signed)
[facility] EMERGENCY DEPARTMENT Provider Note   CSN: [id number] Arrival date & time: 11/22/18  1900    History   Chief Complaint Chief Complaint  Patient presents with   Headache    HPI Carl Carey is a 38 y.o. male with a history of homelessness, parkinsonian features, bipolar 1 disorder who presents to the emergency department with a chief complaint of headache.  The patient reports that he has been having a right temporal headache for the last 11 months.  He states " there is swelling on right forehead into the right eye."  He reports that he has been feeling fatigued and weak all over which has been going on for several months.   No vomiting, abdominal pain, chest pain, shortness of breath, fever, chills.   The patient states that he was living with his mother, but had to leave the home because "it was crowded."   Speech is rapid, pressured, and tangential.  Level 5 caveat secondary to psychiatric disorder.     The history is provided by the patient. No language interpreter was used.    Past Medical History:  Diagnosis Date   Panic attack     Patient Active Problem List   Diagnosis Date Noted   Bipolar 1 disorder ([place]) 11/09/2018   Fever in adult 07/25/2014   Protein-calorie malnutrition, severe ([place]) 07/23/2014   Parkinsonian features 07/21/2014   Dysphagia 07/21/2014   Microcytic anemia 07/21/2014    Past Surgical History:  Procedure Laterality Date   MULTIPLE EXTRACTIONS WITH ALVEOLOPLASTY N/A 07/29/2014   Procedure: Extraction of tooth #'s [phone] with alveoloplasty and gross debridement of teeth;  Surgeon: Lenn Cal, DDS;  Location: [facility];  Service: Oral Surgery;  Laterality: N/A;   NECK SURGERY          Home Medications    Prior to Admission medications   Medication Sig Start Date End Date Taking? Authorizing Provider  benztropine (COGENTIN) 1 MG tablet Take 1 tablet (1 mg total) by mouth 2 (two) times  daily. 11/13/18  Yes Johnn Hai, MD  haloperidol (HALDOL) 5 MG tablet Take 3 tablets (15 mg total) by mouth at bedtime. 11/13/18  Yes Johnn Hai, MD  haloperidol decanoate (HALDOL DECANOATE) 100 MG/ML injection Inject 1 mL (100 mg total) into the muscle every 30 (thirty) days. Due on/approx 8/13 11/13/18  Yes Johnn Hai, MD  traZODone (DESYREL) 150 MG tablet Take 1 tablet (150 mg total) by mouth at bedtime and may repeat dose one time if needed. 11/13/18  Yes Johnn Hai, MD    Family History Family History  Problem Relation Age of Onset   Hypertension Maternal Grandmother     Social History Social History   Tobacco Use   Smoking status: Never Smoker   Smokeless tobacco: Never Used  Substance Use Topics   Alcohol use: Yes   Drug use: No     Allergies   Patient has no known allergies.   Review of Systems Review of Systems  Unable to perform ROS: Psychiatric disorder  Constitutional: Positive for fatigue.  Eyes: Positive for pain.  Neurological: Positive for weakness (generalized) and headaches.     Physical Exam Updated Vital Signs BP 99/65    Pulse (!) 51    Temp 97.8 F (36.6 C) (Oral)    Resp 16    SpO2 100%   Physical Exam Constitutional:      Appearance: He is well-developed.     Comments: Disheveled  HENT:  Head: Normocephalic and atraumatic.     Jaw: There is normal jaw occlusion.     Comments: Head is atraumatic.  No hematoma.  No tenderness over the bilateral frontal or maxillary sinuses.    Nose: Nose normal.     Right Nostril: No septal hematoma.     Left Nostril: No septal hematoma.     Right Sinus: No maxillary sinus tenderness or frontal sinus tenderness.     Left Sinus: No maxillary sinus tenderness or frontal sinus tenderness.     Mouth/Throat:     Pharynx: Oropharynx is clear. Uvula midline.  Eyes:     General:        Right eye: No discharge.        Left eye: No discharge.     Conjunctiva/sclera: Conjunctivae normal.     Pupils:  Pupils are equal, round, and reactive to light.  Neck:     Musculoskeletal: Neck supple.  Cardiovascular:     Rate and Rhythm: Normal rate.     Pulses: Normal pulses.     Heart sounds: Normal heart sounds. No murmur. No friction rub. No gallop.   Pulmonary:     Effort: Pulmonary effort is normal. No respiratory distress.     Breath sounds: No stridor. No wheezing, rhonchi or rales.  Chest:     Chest wall: No tenderness.  Abdominal:     General: There is no distension.     Palpations: There is no mass.     Tenderness: There is no abdominal tenderness. There is no right CVA tenderness, left CVA tenderness, guarding or rebound.     Hernia: No hernia is present.  Neurological:     Mental Status: He is alert and oriented to person, place, and time.     Cranial Nerves: No cranial nerve deficit.     Comments: Moves all 4 extremities.  Good strength against resistance to all 4 extremities.  Follows simple commands.  Psychiatric:        Speech: Speech is rapid and pressured and tangential.        Behavior: Behavior normal.      ED Treatments / Results  Labs (all labs ordered are listed, but only abnormal results are displayed) Labs Reviewed  COMPREHENSIVE METABOLIC PANEL - Abnormal; Notable for the following components:      Result Value   Potassium 3.3 (*)    BUN 5 (*)    Calcium 8.8 (*)    Total Protein 5.8 (*)    Albumin 3.0 (*)    All other components within normal limits  CBC WITH DIFFERENTIAL/PLATELET - Abnormal; Notable for the following components:   Hemoglobin 12.6 (*)    MCH 25.9 (*)    Eosinophils Absolute 0.6 (*)    All other components within normal limits  ACETAMINOPHEN LEVEL - Abnormal; Notable for the following components:   Acetaminophen (Tylenol), Serum <10 (*)    All other components within normal limits  URINALYSIS, ROUTINE W REFLEX MICROSCOPIC - Abnormal; Notable for the following components:   Color, Urine COLORLESS (*)    Specific Gravity, Urine 1.001  (*)    All other components within normal limits  ETHANOL  RAPID URINE DRUG SCREEN, HOSP PERFORMED  SALICYLATE LEVEL    EKG None  Radiology No results found.  Procedures Procedures (including critical care time)  Medications Ordered in ED Medications  acetaminophen (TYLENOL) tablet 650 mg (has no administration in time range)     Initial Impression / Assessment and Plan /  ED Course  I have reviewed the triage vital signs and the nursing notes.  Pertinent labs & imaging results that were available during my care of the patient were reviewed by me and considered in my medical decision making (see chart for details).         38 year old male with a history of homelessness, parkinsonian features, bipolar 1 disorder presenting with multiple, chronic somatic complaints.  Physical exam is unremarkable.  Low suspicion for conjunctivitis, hematoma, CVA, or sepsis.  Patient had a normal head CT earlier this month.  Per chart review, it sounds as if the patient suffers from homelessness.  He was recently discharged from behavioral health to his mother's home, but it sounds as if he no longer resides there.  History is limited as the patient's speech is rapid, pressured, and tangential.  Labs are grossly reassuring.  Given patient's history of bipolar 1 disorder, TTS consult has been ordered.  TTS consult is pending.  Patient care transferred to PA Geiple at the end of my shift pending TTS consult.  Ultimately, the patient may require social work consult for disposition.  Patient presentation, ED course, and plan of care discussed with review of all pertinent labs and imaging. Please see his/her note for further details regarding further ED course and disposition.   Final Clinical Impressions(s) / ED Diagnoses   Final diagnoses:  None    ED Discharge Orders    None       Joanne Gavel, PA-C 11/23/18 0813    Fatima Blank, MD 11/24/18 2021

## 2018-11-23 NOTE — ED Notes (Signed)
Pt discharged to [facility] via [facility]. All belongings sent with pt. Report called to [employer]. Pt stable and ambulatory at discharge. Pt verbalized understanding of plan of care.

## 2018-11-23 NOTE — Progress Notes (Signed)
Pt accepted to [facility]; [facility]    Dr. Abbey Chatters is the accepting/attending provider.   Call report to [phone]   Richardson Landry @ [facility] ED notified.    Pt is voluntary and will be transported by [facility].  Pt may arrive to [facility] as soon as transportation can be arranged.   Audree Camel, LCSW, [place] Disposition [facility] [facility]/TTS [phone]

## 2018-11-23 NOTE — ED Notes (Signed)
Pt is homeless he has no family to call

## 2018-12-05 ENCOUNTER — Other Ambulatory Visit: Payer: Self-pay

## 2018-12-05 ENCOUNTER — Observation Stay (HOSPITAL_COMMUNITY): Admission: AD | Admit: 2018-12-05 | Payer: Medicaid Other | Source: Intra-hospital | Admitting: Psychiatry

## 2018-12-05 ENCOUNTER — Emergency Department (HOSPITAL_COMMUNITY)
Admission: EM | Admit: 2018-12-05 | Discharge: 2018-12-05 | Disposition: A | Discharge status: Home / Self Care | Payer: Medicaid Other | Attending: Emergency Medicine | Admitting: Emergency Medicine

## 2018-12-05 ENCOUNTER — Encounter (HOSPITAL_COMMUNITY): Payer: Self-pay | Admitting: Emergency Medicine

## 2018-12-05 ENCOUNTER — Ambulatory Visit: Payer: Medicaid Other | Attending: Nurse Practitioner | Admitting: Nurse Practitioner

## 2018-12-05 DIAGNOSIS — F17210 Nicotine dependence, cigarettes, uncomplicated: Secondary | ICD-10-CM | POA: Diagnosis not present

## 2018-12-05 DIAGNOSIS — F3112 Bipolar disorder, current episode manic without psychotic features, moderate: Secondary | ICD-10-CM | POA: Insufficient documentation

## 2018-12-05 DIAGNOSIS — F319 Bipolar disorder, unspecified: Secondary | ICD-10-CM | POA: Diagnosis present

## 2018-12-05 DIAGNOSIS — F419 Anxiety disorder, unspecified: Secondary | ICD-10-CM | POA: Diagnosis present

## 2018-12-05 DIAGNOSIS — F309 Manic episode, unspecified: Secondary | ICD-10-CM

## 2018-12-05 DIAGNOSIS — Z79899 Other long term (current) drug therapy: Secondary | ICD-10-CM | POA: Diagnosis not present

## 2018-12-05 LAB — CBC WITH DIFFERENTIAL/PLATELET
Abs Immature Granulocytes: 0.01 10*3/uL (ref 0.00–0.07)
Basophils Absolute: 0 10*3/uL (ref 0.0–0.1)
Basophils Relative: 1 %
Eosinophils Absolute: 0 10*3/uL (ref 0.0–0.5)
Eosinophils Relative: 1 %
HCT: 44.4 % (ref 39.0–52.0)
Hemoglobin: 13.6 g/dL (ref 13.0–17.0)
Immature Granulocytes: 0 %
Lymphocytes Relative: 11 %
Lymphs Abs: 0.7 10*3/uL (ref 0.7–4.0)
MCH: 25.5 pg — ABNORMAL LOW (ref 26.0–34.0)
MCHC: 30.6 g/dL (ref 30.0–36.0)
MCV: 83.3 fL (ref 80.0–100.0)
Monocytes Absolute: 1 10*3/uL (ref 0.1–1.0)
Monocytes Relative: 14 %
Neutro Abs: 5.1 10*3/uL (ref 1.7–7.7)
Neutrophils Relative %: 73 %
Platelets: 309 10*3/uL (ref 150–400)
RBC: 5.33 MIL/uL (ref 4.22–5.81)
RDW: 13.8 % (ref 11.5–15.5)
WBC: 6.9 10*3/uL (ref 4.0–10.5)
nRBC: 0 % (ref 0.0–0.2)

## 2018-12-05 LAB — BASIC METABOLIC PANEL
Anion gap: 9 (ref 5–15)
BUN: 8 mg/dL (ref 6–20)
CO2: 26 mmol/L (ref 22–32)
Calcium: 8.3 mg/dL — ABNORMAL LOW (ref 8.9–10.3)
Chloride: 98 mmol/L (ref 98–111)
Creatinine, Ser: 0.99 mg/dL (ref 0.61–1.24)
GFR calc Af Amer: >60 mL/min (ref >60)
GFR calc non Af Amer: >60 mL/min (ref >60)
Glucose, Bld: 136 mg/dL — ABNORMAL HIGH (ref 70–99)
Potassium: 3.1 mmol/L — ABNORMAL LOW (ref 3.5–5.1)
Sodium: 133 mmol/L — ABNORMAL LOW (ref 135–145)

## 2018-12-05 LAB — RAPID URINE DRUG SCREEN, HOSP PERFORMED
Amphetamines: NOT DETECTED
Barbiturates: NOT DETECTED
Benzodiazepines: NOT DETECTED
Cocaine: NOT DETECTED
Opiates: NOT DETECTED
Tetrahydrocannabinol: NOT DETECTED

## 2018-12-05 LAB — ETHANOL: Alcohol, Ethyl (B): <10 mg/dL (ref <10)

## 2018-12-05 MED ORDER — BENZTROPINE MESYLATE 1 MG PO TABS
1.0000 mg | ORAL_TABLET | Freq: Two times a day (BID) | ORAL | Status: DC
  Administered 2018-12-05: 1 mg via ORAL
  Filled 2018-12-05: qty 1

## 2018-12-05 MED ORDER — POTASSIUM CHLORIDE CRYS ER 20 MEQ PO TBCR
40.0000 meq | EXTENDED_RELEASE_TABLET | Freq: Once | ORAL | Status: AC
  Administered 2018-12-05: 40 meq via ORAL
  Filled 2018-12-05: qty 2

## 2018-12-05 MED ORDER — TRAZODONE HCL 50 MG PO TABS: 150.0000 mg | ORAL_TABLET | Freq: Every evening | ORAL | Status: DC | PRN

## 2018-12-05 MED ORDER — HALOPERIDOL 5 MG PO TABS: 15.0000 mg | ORAL_TABLET | Freq: Every day | ORAL | Status: DC

## 2018-12-05 NOTE — ED Notes (Signed)
Lab in room. Pt changing to paper scrubs now
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
## 2018-12-05 NOTE — ED Triage Notes (Addendum)
Pt here stating he is anxious, pt reports his right leg and right arm has "anxiety movement and is shaking", pt c/o depression, pt c/o swelling to right eye, pt c/o rash to right testicle and buttocks, pt speaks rapid and repetitive during triage and does not answer all questions appropriately, pt a&o x 4

## 2018-12-05 NOTE — Discharge Instructions (Addendum)
Mixed you take your medicines.  Follow-up with [facility] or [facility] once you be seen

## 2018-12-05 NOTE — ED Notes (Signed)
Pt is not wanting to go to [facility].  Carl Carey a [facility] informed and agrees that pt is ok to discharge home.  Will ensure pt had meds and resources.

## 2018-12-05 NOTE — Progress Notes (Addendum)
[facility] Physician Extender, Mordecai Maes, NP, asked that  Patient be given potassium and have an EKG prior to transferring to [facility] Observation unit.  CSW spoke to patient's RN, Daphyne, who indicates that patient does not want to come to [facility] Observation unit.  CSW advised that it is up to the ED provider as to  whether or not patient could be discharged or IVC'd.  Patient does not have criteria for IVC.  Areatha Keas. Judi Cong, MSW, [place] Disposition Clinical Social Work [phone] (cell) [phone] (office)  Per patient's RN, patient is to be discharged by EDP, Dr. Thurnell Garbe.

## 2018-12-05 NOTE — Consult Note (Signed)
Telepsych Consultation   Reason for Consult:  Mania  Referring Physician:  EDP Location of Patient: [facility] Location of Provider: [facility]  Patient Identification: Carl Carey MRN:  [id number] Principal Diagnosis: Bipolar 1 disorder ([place]) Diagnosis:  Principal Problem:   Bipolar 1 disorder ([place])   Total Time spent with patient: 15 minutes  Subjective: " I came to the hospital because I was having swelling inside my eye, abdominal pain, and a pimple or rahs on my scrotum." .  HPI:  [name] is an 38 y.o. male.  -Clinician reviewed note by Dr. Dina Rich.  This is a 38 year old male with history of bipolar disorder, parkinsonian features, anxiety who presents with multiple complaints. He initially stated in triage that he was anxious and right leg was anxious and shaking. He is very pressured in his speech and difficult to obtain history from. He reports that he has a rash in his groin and his armpits. He also refers multiple times to "the bloods and crypts in [place] in [place]." He will not elaborate. He denies hallucinations or delusions. Denies alcohol or drug use. He does not have a stable home situation. He reports that he has not slept any something for sleep. When asked how long it has been since he has slept, he states "about a year and a half."   Patient is difficult to understand initially.  He stammers when he speaks and often he will speak incoherently and need clarification.  He will go into tangents about gangs in [place], [place].  A question can be asked and he will start talking about something totally different.  Patient complains about pain on his right side and being unable to sleep at all because of restlessness on that side of body.  Patient is homeless.  He says his mother, [name] is his foster mother.  He has no support from family.  Patient denies any current SI, HI or A/V hallucinations.  He had some  auditory hallucinations noted on the assessment from 07/23 but reports none now.  Patient says he rarely uses THC or ETOH.  He does admit to some tobacco use.    Patient says he has lost 15 lbs recently.  He says he has not slept "in a year and a half."  He says that he does not have a good appetite.  His complaints are somatic mainly.  He says that he does not want inpatient care at this time.  He mentioned several times about wanting a medication that would assist him with sleeping.    Patient has no outpatient provider.  He was at [facility] July 9-13, '20   Psychiatric evaluation: This is a 38 y.o. male who presents with concerns as noted in HPI above. During this evaluation, he is alert and oriented x3, calm and cooperative. He denies active or passive suicidal thoughts, homicidal ideas, or AVH.  He has been diagnosed with Bipolar disorder along with mania and psychosis and today his speech is rambled and pressured, there is some delusions as well as paranoia. He continues to talk about "the bloods and crypts in [place] in [place]."  He reports he has not been taking his medication because he feels like they are poison . He does not appear internally preoccupied. He endorses he has not been sleeping well.     Past Psychiatric History: bipolar disorder, mania and psychosis, anxiety. Discharged from [facility] 11/13/2018.  Risk to Self: Suicidal Ideation: No Suicidal Intent: No Is  patient at risk for suicide?: No Suicidal Plan?: No Access to Means: No What has been your use of drugs/alcohol within the last 12 months?: Occasional ETOH use How many times?: 0 Other Self Harm Risks: None noted Triggers for Past Attempts: None known Intentional Self Injurious Behavior: None Risk to Others: Homicidal Ideation: No Thoughts of Harm to Others: No Current Homicidal Intent: No Current Homicidal Plan: No Access to Homicidal Means: No Identified Victim: No one History of harm to others?: No Assessment  of Violence: None Noted Violent Behavior Description: None reported Does patient have access to weapons?: No Criminal Charges Pending?: No Does patient have a court date: No Prior Inpatient Therapy: Prior Inpatient Therapy: Yes Prior Therapy Dates: 11/2018 Prior Therapy Facilty/Provider(s): [facility] Reason for Treatment: manic Prior Outpatient Therapy: Prior Outpatient Therapy: No Does patient have an ACCT team?: No Does patient have Intensive In-House Services?  : No Does patient have [facility] services? : No Does patient have P4CC services?: No  Past Medical History:  Past Medical History:  Diagnosis Date  . Panic attack     Past Surgical History:  Procedure Laterality Date  . MULTIPLE EXTRACTIONS WITH ALVEOLOPLASTY N/A 07/29/2014   Procedure: Extraction of tooth #'s 1,15,16,17,18 with alveoloplasty and gross debridement of teeth;  Surgeon: Lenn Cal, DDS;  Location: [facility];  Service: Oral Surgery;  Laterality: N/A;  . NECK SURGERY     Family History:  Family History  Problem Relation Age of Onset  . Hypertension Maternal Grandmother    Family Psychiatric  History:  Social History:  Social History   Substance and Sexual Activity  Alcohol Use Yes   Comment: socially     Social History   Substance and Sexual Activity  Drug Use No    Social History   Socioeconomic History  . Marital status: Single    Spouse name: Not on file  . Number of children: Not on file  . Years of education: Not on file  . Highest education level: Not on file  Occupational History  . Not on file  Social Needs  . Financial resource strain: Not on file  . Food insecurity    Worry: Not on file    Inability: Not on file  . Transportation needs    Medical: Not on file    Non-medical: Not on file  Tobacco Use  . Smoking status: Current Some Day Smoker    Types: Cigarettes  . Smokeless tobacco: Never Used  Substance and Sexual Activity  . Alcohol use: Yes    Comment: socially  .  Drug use: No  . Sexual activity: Not on file  Lifestyle  . Physical activity    Days per week: Not on file    Minutes per session: Not on file  . Stress: Not on file  Relationships  . Social Herbalist on phone: Not on file    Gets together: Not on file    Attends religious service: Not on file    Active member of club or organization: Not on file    Attends meetings of clubs or organizations: Not on file    Relationship status: Not on file  Other Topics Concern  . Not on file  Social History Narrative  . Not on file   Additional Social History:    Allergies:  No Known Allergies  Labs:  Results for orders placed or performed during the hospital encounter of 12/05/18 (from the past 48 hour(s))  CBC  with Differential     Status: Abnormal   Collection Time: 12/05/18  2:43 AM  Result Value Ref Range   WBC 6.9 4.0 - 10.5 K/uL   RBC 5.33 4.22 - 5.81 MIL/uL   Hemoglobin 13.6 13.0 - 17.0 g/dL   HCT 44.4 39.0 - 52.0 %   MCV 83.3 80.0 - 100.0 fL   MCH 25.5 (L) 26.0 - 34.0 pg   MCHC 30.6 30.0 - 36.0 g/dL   RDW 13.8 11.5 - 15.5 %   Platelets 309 150 - 400 K/uL   nRBC 0.0 0.0 - 0.2 %   Neutrophils Relative % 73 %   Neutro Abs 5.1 1.7 - 7.7 K/uL   Lymphocytes Relative 11 %   Lymphs Abs 0.7 0.7 - 4.0 K/uL   Monocytes Relative 14 %   Monocytes Absolute 1.0 0.1 - 1.0 K/uL   Eosinophils Relative 1 %   Eosinophils Absolute 0.0 0.0 - 0.5 K/uL   Basophils Relative 1 %   Basophils Absolute 0.0 0.0 - 0.1 K/uL   Immature Granulocytes 0 %   Abs Immature Granulocytes 0.01 0.00 - 0.07 K/uL    Comment: Performed at [facility], [address] [place], [place] [address]  Basic metabolic panel     Status: Abnormal   Collection Time: 12/05/18  2:43 AM  Result Value Ref Range   Sodium 133 (L) 135 - 145 mmol/L   Potassium 3.1 (L) 3.5 - 5.1 mmol/L   Chloride 98 98 - 111 mmol/L   CO2 26 22 - 32 mmol/L   Glucose, Bld 136 (H) 70 - 99 mg/dL   BUN 8 6 - 20 mg/dL   Creatinine, Ser  0.99 0.61 - 1.24 mg/dL   Calcium 8.3 (L) 8.9 - 10.3 mg/dL   GFR calc non Af Amer >60 >60 mL/min   GFR calc Af Amer >60 >60 mL/min   Anion gap 9 5 - 15    Comment: Performed at [facility], [address], [place], [place] [address]  Ethanol     Status: None   Collection Time: 12/05/18  2:43 AM  Result Value Ref Range   Alcohol, Ethyl (B) <10 <10 mg/dL    Comment: (NOTE) Lowest detectable limit for serum alcohol is 10 mg/dL. For medical purposes only. Performed at [facility], [address], [place], [place] [address]   Rapid urine drug screen (hospital performed)     Status: None   Collection Time: 12/05/18  3:34 AM  Result Value Ref Range   Opiates NONE DETECTED NONE DETECTED   Cocaine NONE DETECTED NONE DETECTED   Benzodiazepines NONE DETECTED NONE DETECTED   Amphetamines NONE DETECTED NONE DETECTED   Tetrahydrocannabinol NONE DETECTED NONE DETECTED   Barbiturates NONE DETECTED NONE DETECTED    Comment: (NOTE) DRUG SCREEN FOR MEDICAL PURPOSES ONLY.  IF CONFIRMATION IS NEEDED FOR ANY PURPOSE, NOTIFY LAB WITHIN 5 DAYS. LOWEST DETECTABLE LIMITS FOR URINE DRUG SCREEN Drug Class                     Cutoff (ng/mL) Amphetamine and metabolites    1000 Barbiturate and metabolites    200 Benzodiazepine                 993 Tricyclics and metabolites     300 Opiates and metabolites        300 Cocaine and metabolites        300 THC  89 Performed at [facility], [address], [place], [place] [address]     Medications:  Current Facility-Administered Medications  Medication Dose Route Frequency Provider Last Rate Last Dose  . benztropine (COGENTIN) tablet 1 mg  1 mg Oral BID Francine Graven, DO      . haloperidol (HALDOL) tablet 15 mg  15 mg Oral QHS Francine Graven, DO      . traZODone (DESYREL) tablet 150 mg  150 mg Oral QHS,MR X 1 Francine Graven, DO       Current Outpatient Medications  Medication Sig Dispense Refill  . benztropine  (COGENTIN) 1 MG tablet Take 1 tablet (1 mg total) by mouth 2 (two) times daily. 60 tablet 2  . haloperidol (HALDOL) 5 MG tablet Take 3 tablets (15 mg total) by mouth at bedtime. 90 tablet 2  . haloperidol decanoate (HALDOL DECANOATE) 100 MG/ML injection Inject 1 mL (100 mg total) into the muscle every 30 (thirty) days. Due on/approx 8/13 1 mL 11  . traZODone (DESYREL) 150 MG tablet Take 1 tablet (150 mg total) by mouth at bedtime and may repeat dose one time if needed. 90 tablet 1    Musculoskeletal: Unable to assess as evaluation via telepsych.   Psychiatric Specialty Exam: Physical Exam  Nursing note reviewed. Constitutional: He is oriented to person, place, and time.  Neurological: He is alert and oriented to person, place, and time.    ROS  Blood pressure 132/79, pulse (!) 109, temperature 98.8 F (37.1 C), temperature source Oral, resp. rate 17, height 5' 8"  (1.727 m), weight 65.3 kg, SpO2 96 %.Body mass index is 21.9 kg/m.  General Appearance: Casual  Eye Contact:  Good  Speech:  Garbled and Pressured  Volume:  Normal  Mood:  manic  Affect:  Full Range  Thought Process:  Disorganized and Descriptions of Associations: Loose  Orientation:  Full (Time, Place, and Person)  Thought Content:  Delusions and Paranoid Ideation  Suicidal Thoughts:  No  Homicidal Thoughts:  No  Memory:  Immediate;   Fair Recent;   Fair  Judgement:  Impaired  Insight:  Shallow  Psychomotor Activity:  Normal  Concentration:  Concentration: Fair and Attention Span: Fair  Recall:  Poor  Fund of Knowledge:  Poor  Language:  Poor  Akathisia:  Negative  Handed:  Right  AIMS (if indicated):     Assets:  Leisure Time Physical Health  ADL's:  Intact  Cognition:  WNL  Sleep:        Treatment Plan Summary: Daily contact with patient to assess and evaluate symptoms and progress in treatment  Disposition: Patient presents with somatic delusions as well as some paranoia. He seems to be somatic  although he admits that he has not been taking his medications. Reviewed chart and it seems as though his medications have been restarted which would have been my recommendation. I am further recommending that he he starts the medication and be observed overnight. He will be reassed by psychiatry int he morning to see if he is more stable.He denies SI, HI or AVH at this time.     Nurse Dapheny was updated on current disposition     This service was provided via telemedicine using a 2-way, interactive audio and video technology.  Names of all persons participating in this telemedicine service and their role in this encounter. Name: Mordecai Maes Role: FNP-C  Name: [name]  Role: Patient    Mordecai Maes, NP 12/05/2018 9:01 AM

## 2018-12-05 NOTE — ED Provider Notes (Signed)
[facility] EMERGENCY DEPARTMENT Provider Note   CSN: [id number] Arrival date & time: 12/05/18  0146    History   Chief Complaint Chief Complaint  Patient presents with  . Anxiety    HPI Carl Carey is a 38 y.o. male.     HPI  This is a 38 year old male with history of bipolar disorder, parkinsonian features, anxiety who presents with multiple complaints.  He initially stated in triage that he was anxious and right leg was anxious and shaking.  He is very pressured in his speech and difficult to obtain history from.  He reports that he has a rash in his groin and his armpits.  He also refers multiple times to "the bloods and crypts in [place] in [place]."  He will not elaborate.  He denies hallucinations or delusions.  Denies alcohol or drug use.  He does not have a stable home situation.  He reports that he has not slept any something for sleep.  When asked how long it has been since he has slept, he states "about a year and a half."    Patient was seen and evaluated twice in July with 2 admissions 1 to behavioral health in 1 to [facility] in [place].  Patient reports that he spent "about 5 days at [facility]."  Last admission was 7/23.  He is not currently on any medications.  Past Medical History:  Diagnosis Date  . Panic attack     Patient Active Problem List   Diagnosis Date Noted  . Bipolar 1 disorder ([place]) 11/09/2018  . Fever in adult 07/25/2014  . Protein-calorie malnutrition, severe ([place]) 07/23/2014  . Parkinsonian features 07/21/2014  . Dysphagia 07/21/2014  . Microcytic anemia 07/21/2014    Past Surgical History:  Procedure Laterality Date  . MULTIPLE EXTRACTIONS WITH ALVEOLOPLASTY N/A 07/29/2014   Procedure: Extraction of tooth #'s 1,15,16,17,18 with alveoloplasty and gross debridement of teeth;  Surgeon: Lenn Cal, DDS;  Location: [facility];  Service: Oral Surgery;  Laterality: N/A;  . NECK SURGERY          Home Medications     Prior to Admission medications   Medication Sig Start Date End Date Taking? Authorizing Provider  benztropine (COGENTIN) 1 MG tablet Take 1 tablet (1 mg total) by mouth 2 (two) times daily. 11/13/18   Johnn Hai, MD  haloperidol (HALDOL) 5 MG tablet Take 3 tablets (15 mg total) by mouth at bedtime. 11/13/18   Johnn Hai, MD  haloperidol decanoate (HALDOL DECANOATE) 100 MG/ML injection Inject 1 mL (100 mg total) into the muscle every 30 (thirty) days. Due on/approx 8/13 11/13/18   Johnn Hai, MD  traZODone (DESYREL) 150 MG tablet Take 1 tablet (150 mg total) by mouth at bedtime and may repeat dose one time if needed. 11/13/18   Johnn Hai, MD    Family History Family History  Problem Relation Age of Onset  . Hypertension Maternal Grandmother     Social History Social History   Tobacco Use  . Smoking status: Current Some Day Smoker    Types: Cigarettes  . Smokeless tobacco: Never Used  Substance Use Topics  . Alcohol use: Yes    Comment: socially  . Drug use: No     Allergies   Patient has no known allergies.   Review of Systems Review of Systems  Constitutional: Negative for fever.  Respiratory: Negative for shortness of breath.   Cardiovascular: Negative for chest pain.  Genitourinary: Negative for dysuria.  Skin: Positive  for rash.  Psychiatric/Behavioral: Positive for agitation and sleep disturbance. Negative for behavioral problems, hallucinations and suicidal ideas. The patient is nervous/anxious and is hyperactive.   All other systems reviewed and are negative.    Physical Exam Updated Vital Signs BP 132/79 (BP Location: Right Arm)   Pulse (!) 109   Temp 98.8 F (37.1 C) (Oral)   Resp 17   Ht 1.727 m (5' 8" )   Wt 65.3 kg   SpO2 96%   BMI 21.90 kg/m   Physical Exam Vitals signs and nursing note reviewed.  Constitutional:      Appearance: He is well-developed.     Comments: Disheveled appearing, disorganized  HENT:     Head: Normocephalic and  atraumatic.  Eyes:     Pupils: Pupils are equal, round, and reactive to light.  Cardiovascular:     Rate and Rhythm: Normal rate and regular rhythm.  Pulmonary:     Effort: Pulmonary effort is normal.     Breath sounds: Normal breath sounds.  Abdominal:     General: Bowel sounds are normal.     Palpations: Abdomen is soft.     Tenderness: There is no abdominal tenderness.  Musculoskeletal:     Right lower leg: No edema.     Left lower leg: No edema.  Lymphadenopathy:     Cervical: No cervical adenopathy.  Skin:    General: Skin is warm and dry.     Comments: Patient appears to have hidradenitis in the bilateral axilla and groin, no drainable abscesses at this time  Neurological:     Mental Status: He is alert and oriented to person, place, and time.  Psychiatric:     Comments: Disorganized thought content, pressured speech, stutters      ED Treatments / Results  Labs (all labs ordered are listed, but only abnormal results are displayed) Labs Reviewed  CBC WITH DIFFERENTIAL/PLATELET - Abnormal; Notable for the following components:      Result Value   MCH 25.5 (*)    All other components within normal limits  BASIC METABOLIC PANEL - Abnormal; Notable for the following components:   Sodium 133 (*)    Potassium 3.1 (*)    Glucose, Bld 136 (*)    Calcium 8.3 (*)    All other components within normal limits  ETHANOL  RAPID URINE DRUG SCREEN, HOSP PERFORMED    EKG None  Radiology No results found.  Procedures Procedures (including critical care time)  Medications Ordered in ED Medications - No data to display   Initial Impression / Assessment and Plan / ED Course  I have reviewed the triage vital signs and the nursing notes.  Pertinent labs & imaging results that were available during my care of the patient were reviewed by me and considered in my medical decision making (see chart for details).        Patient presents with multiple somatic complaints.   Very pressured and disorganized.  Appears manic.  He has had 3 evaluations this month for the same.  He is overall nontoxic-appearing and vital signs are reassuring.  Physical exam is notable for some hidradenitis in the axilla and groin area which is likely the rash he is talking about.  There is no drainable abscess.  Lab work obtained and reviewed.  Patient is medically clear for TTS evaluation.  4:10 AM Recommendation for reevaluation in the morning.  Final Clinical Impressions(s) / ED Diagnoses   Final diagnoses:  Mania ([place])  ED Discharge Orders    None       Horton, Barbette Hair, MD 12/05/18 [phone]

## 2018-12-05 NOTE — ED Notes (Signed)
Pt noted pacing around room and mumbling.

## 2018-12-05 NOTE — ED Notes (Signed)
Spoke with Mordecai Maes, NP, pt is not SI/HI and not a harm to anyone or himself.  Pt medications need to be restarted and may d/c home tomorrow.

## 2018-12-05 NOTE — ED Notes (Signed)
TTS in progress 

## 2018-12-05 NOTE — ED Notes (Signed)
Per Joselyn Arrow at [facility], pt has been accepted to obs pending negative covid test.  If covid negative, pt can go to [facility] room 204.  Dr. Dwyane Dee is the attending, the nurse is Maudie Mercury, RN.  Phone number  for report is [phone]

## 2018-12-05 NOTE — ED Notes (Signed)
Pt reports being homeless. Pt wearing multiples of each item of clothing. Pt states that he hasn't slept in 6 days. Pt given meal tray also

## 2018-12-05 NOTE — ED Notes (Signed)
Pt wanded. 3 full bags of belongings placed in floor of locker room

## 2019-01-13 ENCOUNTER — Emergency Department (HOSPITAL_COMMUNITY): Payer: Medicaid Other

## 2019-01-13 ENCOUNTER — Other Ambulatory Visit: Payer: Self-pay

## 2019-01-13 ENCOUNTER — Inpatient Hospital Stay (HOSPITAL_COMMUNITY)
Admission: EM | Admit: 2019-01-13 | Discharge: 2019-01-25 | DRG: 871 | Disposition: A | Discharge status: Home / Self Care | Payer: Medicaid Other | Attending: Internal Medicine | Admitting: Internal Medicine

## 2019-01-13 ENCOUNTER — Encounter (HOSPITAL_COMMUNITY): Payer: Self-pay | Admitting: Pulmonary Disease

## 2019-01-13 DIAGNOSIS — F3173 Bipolar disorder, in partial remission, most recent episode manic: Secondary | ICD-10-CM

## 2019-01-13 DIAGNOSIS — E441 Mild protein-calorie malnutrition: Secondary | ICD-10-CM | POA: Diagnosis present

## 2019-01-13 DIAGNOSIS — R4182 Altered mental status, unspecified: Secondary | ICD-10-CM | POA: Diagnosis not present

## 2019-01-13 DIAGNOSIS — R64 Cachexia: Secondary | ICD-10-CM | POA: Diagnosis present

## 2019-01-13 DIAGNOSIS — K209 Esophagitis, unspecified: Secondary | ICD-10-CM | POA: Diagnosis present

## 2019-01-13 DIAGNOSIS — A419 Sepsis, unspecified organism: Secondary | ICD-10-CM | POA: Diagnosis present

## 2019-01-13 DIAGNOSIS — E1165 Type 2 diabetes mellitus with hyperglycemia: Secondary | ICD-10-CM | POA: Diagnosis present

## 2019-01-13 DIAGNOSIS — R6521 Severe sepsis with septic shock: Secondary | ICD-10-CM | POA: Diagnosis present

## 2019-01-13 DIAGNOSIS — E872 Acidosis, unspecified: Secondary | ICD-10-CM

## 2019-01-13 DIAGNOSIS — J9601 Acute respiratory failure with hypoxia: Secondary | ICD-10-CM

## 2019-01-13 DIAGNOSIS — Z79899 Other long term (current) drug therapy: Secondary | ICD-10-CM

## 2019-01-13 DIAGNOSIS — Z6820 Body mass index (BMI) 20.0-20.9, adult: Secondary | ICD-10-CM

## 2019-01-13 DIAGNOSIS — R740 Nonspecific elevation of levels of transaminase and lactic acid dehydrogenase [LDH]: Secondary | ICD-10-CM | POA: Diagnosis not present

## 2019-01-13 DIAGNOSIS — G4700 Insomnia, unspecified: Secondary | ICD-10-CM | POA: Diagnosis not present

## 2019-01-13 DIAGNOSIS — K529 Noninfective gastroenteritis and colitis, unspecified: Secondary | ICD-10-CM | POA: Diagnosis not present

## 2019-01-13 DIAGNOSIS — J189 Pneumonia, unspecified organism: Secondary | ICD-10-CM | POA: Diagnosis not present

## 2019-01-13 DIAGNOSIS — K626 Ulcer of anus and rectum: Secondary | ICD-10-CM | POA: Diagnosis present

## 2019-01-13 DIAGNOSIS — E878 Other disorders of electrolyte and fluid balance, not elsewhere classified: Secondary | ICD-10-CM | POA: Diagnosis not present

## 2019-01-13 DIAGNOSIS — Z20828 Contact with and (suspected) exposure to other viral communicable diseases: Secondary | ICD-10-CM | POA: Diagnosis present

## 2019-01-13 DIAGNOSIS — G20 Parkinson's disease: Secondary | ICD-10-CM | POA: Diagnosis present

## 2019-01-13 DIAGNOSIS — J159 Unspecified bacterial pneumonia: Secondary | ICD-10-CM | POA: Diagnosis present

## 2019-01-13 DIAGNOSIS — F319 Bipolar disorder, unspecified: Secondary | ICD-10-CM | POA: Diagnosis present

## 2019-01-13 DIAGNOSIS — E876 Hypokalemia: Secondary | ICD-10-CM | POA: Diagnosis not present

## 2019-01-13 DIAGNOSIS — K635 Polyp of colon: Secondary | ICD-10-CM | POA: Diagnosis present

## 2019-01-13 DIAGNOSIS — K5010 Crohn's disease of large intestine without complications: Secondary | ICD-10-CM | POA: Diagnosis present

## 2019-01-13 DIAGNOSIS — K259 Gastric ulcer, unspecified as acute or chronic, without hemorrhage or perforation: Secondary | ICD-10-CM | POA: Diagnosis present

## 2019-01-13 DIAGNOSIS — F410 Panic disorder [episodic paroxysmal anxiety] without agoraphobia: Secondary | ICD-10-CM | POA: Diagnosis present

## 2019-01-13 DIAGNOSIS — Z720 Tobacco use: Secondary | ICD-10-CM | POA: Diagnosis not present

## 2019-01-13 DIAGNOSIS — F17210 Nicotine dependence, cigarettes, uncomplicated: Secondary | ICD-10-CM | POA: Diagnosis present

## 2019-01-13 DIAGNOSIS — N179 Acute kidney failure, unspecified: Secondary | ICD-10-CM | POA: Diagnosis present

## 2019-01-13 DIAGNOSIS — D72829 Elevated white blood cell count, unspecified: Secondary | ICD-10-CM | POA: Diagnosis not present

## 2019-01-13 DIAGNOSIS — R739 Hyperglycemia, unspecified: Secondary | ICD-10-CM

## 2019-01-13 DIAGNOSIS — R509 Fever, unspecified: Secondary | ICD-10-CM | POA: Diagnosis not present

## 2019-01-13 DIAGNOSIS — D509 Iron deficiency anemia, unspecified: Secondary | ICD-10-CM | POA: Diagnosis present

## 2019-01-13 DIAGNOSIS — R197 Diarrhea, unspecified: Secondary | ICD-10-CM | POA: Diagnosis not present

## 2019-01-13 HISTORY — DX: Bipolar disorder, unspecified: F31.9

## 2019-01-13 HISTORY — DX: Parkinson's disease: G20

## 2019-01-13 HISTORY — DX: Parkinsonism, unspecified: G20.C

## 2019-01-13 HISTORY — DX: Type 2 diabetes mellitus without complications: E11.9

## 2019-01-13 HISTORY — DX: Unspecified protein-calorie malnutrition: E46

## 2019-01-13 LAB — POCT I-STAT 7, (LYTES, BLD GAS, ICA,H+H)
Acid-base deficit: 19 mmol/L — ABNORMAL HIGH (ref 0.0–2.0)
Acid-base deficit: 13 mmol/L — ABNORMAL HIGH (ref 0.0–2.0)
Bicarbonate: 9.5 mmol/L — ABNORMAL LOW (ref 20.0–28.0)
Bicarbonate: 13.9 mmol/L — ABNORMAL LOW (ref 20.0–28.0)
Calcium, Ion: 1.25 mmol/L (ref 1.15–1.40)
Calcium, Ion: 1.17 mmol/L (ref 1.15–1.40)
HCT: 47 % (ref 39.0–52.0)
HCT: 40 % (ref 39.0–52.0)
Hemoglobin: 16 g/dL (ref 13.0–17.0)
Hemoglobin: 13.6 g/dL (ref 13.0–17.0)
O2 Saturation: 99 %
O2 Saturation: 95 %
Patient temperature: 96.3
Patient temperature: 98.3
Potassium: 4.8 mmol/L (ref 3.5–5.1)
Potassium: 3 mmol/L — ABNORMAL LOW (ref 3.5–5.1)
Sodium: 130 mmol/L — ABNORMAL LOW (ref 135–145)
Sodium: 135 mmol/L (ref 135–145)
TCO2: 10 mmol/L — ABNORMAL LOW (ref 22–32)
TCO2: 15 mmol/L — ABNORMAL LOW (ref 22–32)
pCO2 arterial: 28.9 mmHg — ABNORMAL LOW (ref 32.0–48.0)
pCO2 arterial: 34.3 mmHg (ref 32.0–48.0)
pH, Arterial: 7.114 — CL (ref 7.350–7.450)
pH, Arterial: 7.216 — ABNORMAL LOW (ref 7.350–7.450)
pO2, Arterial: 175 mmHg — ABNORMAL HIGH (ref 83.0–108.0)
pO2, Arterial: 88 mmHg (ref 83.0–108.0)

## 2019-01-13 LAB — COMPREHENSIVE METABOLIC PANEL
ALT: 42 U/L (ref 0–44)
AST: 119 U/L — ABNORMAL HIGH (ref 15–41)
Albumin: 3.2 g/dL — ABNORMAL LOW (ref 3.5–5.0)
Alkaline Phosphatase: 111 U/L (ref 38–126)
Anion gap: 24 — ABNORMAL HIGH (ref 5–15)
BUN: 5 mg/dL — ABNORMAL LOW (ref 6–20)
CO2: 10 mmol/L — ABNORMAL LOW (ref 22–32)
Calcium: 8.8 mg/dL — ABNORMAL LOW (ref 8.9–10.3)
Chloride: 96 mmol/L — ABNORMAL LOW (ref 98–111)
Creatinine, Ser: 1.59 mg/dL — ABNORMAL HIGH (ref 0.61–1.24)
GFR calc Af Amer: >60 mL/min (ref >60)
GFR calc non Af Amer: 55 mL/min — ABNORMAL LOW (ref >60)
Glucose, Bld: 401 mg/dL — ABNORMAL HIGH (ref 70–99)
Potassium: 3.5 mmol/L (ref 3.5–5.1)
Sodium: 130 mmol/L — ABNORMAL LOW (ref 135–145)
Total Bilirubin: 0.7 mg/dL (ref 0.3–1.2)
Total Protein: 6.9 g/dL (ref 6.5–8.1)

## 2019-01-13 LAB — RAPID URINE DRUG SCREEN, HOSP PERFORMED
Amphetamines: NOT DETECTED
Barbiturates: NOT DETECTED
Benzodiazepines: NOT DETECTED
Cocaine: NOT DETECTED
Opiates: NOT DETECTED
Tetrahydrocannabinol: NOT DETECTED

## 2019-01-13 LAB — CBC WITH DIFFERENTIAL/PLATELET
Abs Immature Granulocytes: 1.7 10*3/uL — ABNORMAL HIGH (ref 0.00–0.07)
Basophils Absolute: 0.1 10*3/uL (ref 0.0–0.1)
Basophils Relative: 1 %
Eosinophils Absolute: 0 10*3/uL (ref 0.0–0.5)
Eosinophils Relative: 0 %
HCT: 44.6 % (ref 39.0–52.0)
Hemoglobin: 13.9 g/dL (ref 13.0–17.0)
Immature Granulocytes: 8 %
Lymphocytes Relative: 4 %
Lymphs Abs: 0.9 10*3/uL (ref 0.7–4.0)
MCH: 26.3 pg (ref 26.0–34.0)
MCHC: 31.2 g/dL (ref 30.0–36.0)
MCV: 84.5 fL (ref 80.0–100.0)
Monocytes Absolute: 0.8 10*3/uL (ref 0.1–1.0)
Monocytes Relative: 4 %
Neutro Abs: 17.9 10*3/uL — ABNORMAL HIGH (ref 1.7–7.7)
Neutrophils Relative %: 83 %
Platelets: 667 10*3/uL — ABNORMAL HIGH (ref 150–400)
RBC: 5.28 MIL/uL (ref 4.22–5.81)
RDW: 13.3 % (ref 11.5–15.5)
WBC: 21.4 10*3/uL — ABNORMAL HIGH (ref 4.0–10.5)
nRBC: 0 % (ref 0.0–0.2)

## 2019-01-13 LAB — GLUCOSE, CAPILLARY
Glucose-Capillary: 113 mg/dL — ABNORMAL HIGH (ref 70–99)
Glucose-Capillary: 77 mg/dL (ref 70–99)

## 2019-01-13 LAB — LACTIC ACID, PLASMA
Lactic Acid, Venous: >11 mmol/L (ref 0.5–1.9)
Lactic Acid, Venous: 7 mmol/L (ref 0.5–1.9)
Lactic Acid, Venous: 3.9 mmol/L (ref 0.5–1.9)
Lactic Acid, Venous: 1.9 mmol/L (ref 0.5–1.9)

## 2019-01-13 LAB — URINALYSIS, ROUTINE W REFLEX MICROSCOPIC
Bacteria, UA: NONE SEEN
Bilirubin Urine: NEGATIVE
Glucose, UA: >=500 mg/dL — AB
Ketones, ur: NEGATIVE mg/dL
Leukocytes,Ua: NEGATIVE
Nitrite: NEGATIVE
Protein, ur: 100 mg/dL — AB
Specific Gravity, Urine: 1.014 (ref 1.005–1.030)
pH: 5 (ref 5.0–8.0)

## 2019-01-13 LAB — LIPASE, BLOOD: Lipase: 50 U/L (ref 11–51)

## 2019-01-13 LAB — APTT: aPTT: 27 seconds (ref 24–36)

## 2019-01-13 LAB — SARS CORONAVIRUS 2 BY RT PCR (HOSPITAL ORDER, PERFORMED IN ~~LOC~~ HOSPITAL LAB): SARS Coronavirus 2: NEGATIVE

## 2019-01-13 LAB — C DIFFICILE QUICK SCREEN W PCR REFLEX
C Diff antigen: NEGATIVE
C Diff interpretation: NOT DETECTED
C Diff toxin: NEGATIVE

## 2019-01-13 LAB — ETHANOL: Alcohol, Ethyl (B): <10 mg/dL (ref <10)

## 2019-01-13 LAB — CBG MONITORING, ED
Glucose-Capillary: 403 mg/dL — ABNORMAL HIGH (ref 70–99)
Glucose-Capillary: 106 mg/dL — ABNORMAL HIGH (ref 70–99)

## 2019-01-13 LAB — PROCALCITONIN: Procalcitonin: 0.1 ng/mL

## 2019-01-13 LAB — PROTIME-INR
INR: 1.1 (ref 0.8–1.2)
Prothrombin Time: 14.1 seconds (ref 11.4–15.2)

## 2019-01-13 LAB — TROPONIN I (HIGH SENSITIVITY): Troponin I (High Sensitivity): 98 ng/L — ABNORMAL HIGH (ref <18)

## 2019-01-13 MED ORDER — POTASSIUM CHLORIDE 10 MEQ/100ML IV SOLN
10.0000 meq | INTRAVENOUS | Status: AC
  Administered 2019-01-13 – 2019-01-14 (×4): 10 meq via INTRAVENOUS
  Filled 2019-01-13 (×4): qty 100

## 2019-01-13 MED ORDER — SODIUM CHLORIDE 0.9 % IV SOLN
500.0000 mg | Freq: Once | INTRAVENOUS | Status: AC
  Administered 2019-01-13: 16:00:00 500 mg via INTRAVENOUS
  Filled 2019-01-13: qty 500

## 2019-01-13 MED ORDER — THIAMINE HCL 100 MG/ML IJ SOLN
100.0000 mg | Freq: Every day | INTRAMUSCULAR | Status: AC
  Administered 2019-01-13 – 2019-01-15 (×3): 100 mg via INTRAVENOUS
  Filled 2019-01-13 (×3): qty 2

## 2019-01-13 MED ORDER — ONDANSETRON HCL 4 MG/2ML IJ SOLN
INTRAMUSCULAR | Status: AC
  Administered 2019-01-13: 16:00:00 4 mg
  Filled 2019-01-13: qty 2

## 2019-01-13 MED ORDER — ACETAMINOPHEN 325 MG PO TABS
650.0000 mg | ORAL_TABLET | Freq: Four times a day (QID) | ORAL | Status: DC | PRN
  Administered 2019-01-14 – 2019-01-22 (×12): 650 mg via ORAL
  Filled 2019-01-13 (×12): qty 2

## 2019-01-13 MED ORDER — SODIUM CHLORIDE 0.9 % IV SOLN
1.0000 g | Freq: Once | INTRAVENOUS | Status: AC
  Administered 2019-01-13: 1 g via INTRAVENOUS
  Filled 2019-01-13: qty 10

## 2019-01-13 MED ORDER — SODIUM CHLORIDE 0.9 % IV BOLUS
1000.0000 mL | Freq: Once | INTRAVENOUS | Status: AC
  Administered 2019-01-13: 16:00:00 1000 mL via INTRAVENOUS

## 2019-01-13 MED ORDER — DEXTROSE IN LACTATED RINGERS 5 % IV SOLN: INTRAVENOUS | Status: DC

## 2019-01-13 MED ORDER — LACTATED RINGERS IV BOLUS (SEPSIS)
500.0000 mL | Freq: Once | INTRAVENOUS | Status: AC
  Administered 2019-01-13: 500 mL via INTRAVENOUS

## 2019-01-13 MED ORDER — SODIUM CHLORIDE 0.9 % IV SOLN
500.0000 mg | INTRAVENOUS | Status: DC
  Administered 2019-01-14 – 2019-01-17 (×4): 500 mg via INTRAVENOUS
  Filled 2019-01-13 (×5): qty 500

## 2019-01-13 MED ORDER — HEPARIN SODIUM (PORCINE) 5000 UNIT/ML IJ SOLN
5000.0000 [IU] | Freq: Three times a day (TID) | INTRAMUSCULAR | Status: DC
  Administered 2019-01-13 – 2019-01-25 (×36): 5000 [IU] via SUBCUTANEOUS
  Filled 2019-01-13 (×34): qty 1

## 2019-01-13 MED ORDER — ACETAMINOPHEN 650 MG RE SUPP
650.0000 mg | Freq: Four times a day (QID) | RECTAL | Status: DC | PRN
  Filled 2019-01-13: qty 1

## 2019-01-13 MED ORDER — LORAZEPAM 2 MG/ML IJ SOLN
0.5000 mg | Freq: Once | INTRAMUSCULAR | Status: AC
  Administered 2019-01-13: 0.5 mg via INTRAVENOUS
  Filled 2019-01-13: qty 1

## 2019-01-13 MED ORDER — SODIUM CHLORIDE 0.9 % IV SOLN
1.0000 g | INTRAVENOUS | Status: DC
  Filled 2019-01-13: qty 10

## 2019-01-13 MED ORDER — LACTATED RINGERS IV SOLN
INTRAVENOUS | Status: DC
  Administered 2019-01-13 – 2019-01-14 (×2): via INTRAVENOUS

## 2019-01-13 MED ORDER — SODIUM CHLORIDE 0.9 % IV BOLUS
1000.0000 mL | Freq: Once | INTRAVENOUS | Status: AC
  Administered 2019-01-13: 1000 mL via INTRAVENOUS

## 2019-01-13 MED ORDER — LORAZEPAM 2 MG/ML IJ SOLN
0.5000 mg | INTRAMUSCULAR | Status: DC | PRN
  Administered 2019-01-13 – 2019-01-15 (×3): 0.5 mg via INTRAVENOUS
  Filled 2019-01-13 (×3): qty 1

## 2019-01-13 MED ORDER — METOPROLOL TARTRATE 5 MG/5ML IV SOLN
5.0000 mg | Freq: Once | INTRAVENOUS | Status: AC
  Administered 2019-01-13: 20:00:00 5 mg via INTRAVENOUS
  Filled 2019-01-13: qty 5

## 2019-01-13 MED ORDER — SODIUM CHLORIDE 0.9% FLUSH
3.0000 mL | Freq: Two times a day (BID) | INTRAVENOUS | Status: DC
  Administered 2019-01-15 – 2019-01-25 (×9): 3 mL via INTRAVENOUS

## 2019-01-13 MED ORDER — INSULIN ASPART 100 UNIT/ML ~~LOC~~ SOLN
1.0000 [IU] | SUBCUTANEOUS | Status: DC
  Administered 2019-01-16: 01:00:00 1 [IU] via SUBCUTANEOUS
  Administered 2019-01-16: 2 [IU] via SUBCUTANEOUS
  Administered 2019-01-17 – 2019-01-18 (×2): 1 [IU] via SUBCUTANEOUS

## 2019-01-13 MED ORDER — ONDANSETRON HCL 4 MG/2ML IJ SOLN
4.0000 mg | Freq: Four times a day (QID) | INTRAMUSCULAR | Status: DC | PRN
  Administered 2019-01-17: 4 mg via INTRAVENOUS
  Filled 2019-01-13: qty 2

## 2019-01-13 NOTE — Progress Notes (Addendum)
eLink Physician-Brief Progress Note Patient Name: Carl Carey DOB: [birth year] MRN: [id number]   Date of Service  01/13/2019  HPI/Events of Note  Notified by RN that patient arrives from the ED tachycardic with HR in the 150s, RR in the 30s. Pt is getting 3rd unit of IVF.  38/M found by family lethargic near a pool of vomitus.  Pt has elevated lactate and leukocytosis.  CXR with infiltrates. COVID negative.  BP 130s, HR 150s.   eICU Interventions  Continue IVF bolus.  Continue insulin for glucose control. Continue with Azithromycin and Ceftriaxone. Give Lopressor 94m IV. Replete K.  Heparin for DVT Prophylaxis.      Intervention Category Major Interventions: Arrhythmia - evaluation and management  VElsie Lincoln9/04/2019, 8:25 PM   8:58 PM BP 112/70.  HR now in the 110s.  Hold Lopressor.  Pt also with no urine output. Plan> Check bladder scan.  Insert foley cath if >500cc in the bladder. Need to monitor I&Os in setting of AKI.  Finishing IVF bolus. Start on LR@ 1229mhr thereafter.

## 2019-01-13 NOTE — Progress Notes (Signed)
Notified bedside nurse of need to draw lactic acid @ 1709.

## 2019-01-13 NOTE — H&P (Signed)
NAME:  Carl Carey, MRN:  [id number], DOB:  18-Jun-1980, LOS: 0 ADMISSION DATE:  01/13/2019, CONSULTATION DATE:  01/13/2019 REFERRING MD:  Alvino Chapel - ED [facility], CHIEF COMPLAINT:  Respiratory failure.   Brief History   38 year old man with septic shock and respiratory failure  History of present illness   Extensive psychiatric history with admissions for BAD.   Recently seen at [facility] for abdominal pain 01/04/2019. Presented with several months of diarrhea and decreased appetite. Stool culture and C diff were negative.  He was febrile at that time but CT consistent with non-specific enteritis.  He was treated with fluids and discharged.  Found today by family, lethargic near pool of emesis. Significantly altered in ED. Markedly elevated lactate, leukocytosis.  Code SEPSIS - given fluids, started on CAP coverage.  Past Medical History   Past Medical History:  Diagnosis Date  . Bipolar 1 disorder ([place])   . Diabetes ([place])   . Malnutrition ([place])   . Panic attack   . Parkinsonism ([place])     Significant Hospital Events   Admitted 9/12  Consults:  PCCM  Procedures:  BiPAP - 9/12  Significant Diagnostic Tests:  CXR - shows diffuse opacities right lower lung, possible consolidation at left base.  Micro Data:  SARS-CoV2 - negative.  Antimicrobials:  Azithromycin 9/12 Ceftriaxone 9/12   Interim history/subjective:  Looks better than on arrival according to RN. Patient more interactive. Continues to have diarrhea. Denies pain.  Objective   Blood pressure (!) 148/97, pulse (!) 133, temperature (!) 96.3 F (35.7 C), temperature source Temporal, resp. rate (!) 36, height 5' 8"  (1.727 m), weight 65 kg, SpO2 95 %.    Vent Mode: BIPAP FiO2 (%):  [60 %-100 %] 60 % PEEP:  [6 cmH20] 6 cmH20   Intake/Output Summary (Last 24 hours) at 01/13/2019 1724 Last data filed at 01/13/2019 1655 Gross per 24 hour  Intake 1350 ml  Output -  Net 1350 ml   Filed Weights   01/13/19 1400  Weight:  65 kg    Examination: General: Cachexic man in moderate respiratory distress. HENT: Moist oral mucosa, no icterus. Lungs: Chest crackles on right side. Moderate accessory muscle use but patient still speaking in short sentences.  Cardiovascular: JVP 2cm ASA. HS normal, Capillary refill 2-3sec. No edema Abdomen: Soft, non-tender, no organomegaly or masses. Extremities: No active joints. Neuro: Awake and interactive, pressured speech. No focal deficits. GU: Voiding spontaneously.  I performed a bedside echo showing normal LV/RV function. Heart is underfilled. No valvular lesions. Near complete collapse of IVC. Interstitial pattern R lung. No consolidation.  Resolved Hospital Problem list   n/a  Assessment & Plan:   Critically ill due to hypotension and shock of unclear etiology requiring aggressive fluid resuscitation. Clinical perfusion appears to have improved at this time. Follow serial lactates Continue IV fluids Consider vasopressors if become hypotensive.  Critically ill due to acute hypoxic respiratory failure with increase work of breathing requiring due to metabolic acidosis requiring BiPAP Patient is improving clinically and acidosis should resolve with sepsis resuscitation. Continue BiPAP for now. Follow ABG Remains at high risk for intubation.  Unilateral interstitial pattern consistent with aspiration pneumonia as suspected on CXR.  Continue current antibiotics as ceftriaxone will provide adequate anaerobic coverage.  CAP remains in the differential.  Elevated lactate Possibly due to hypovolemia or sepsis  Seizure with syncope leading to aspiration (from hypovolemia) also possible. Follow lactate clearance. Lack of abdominal pain argues against intraabdominal catastrophe.  Chronic  diarrhea with recent investigation at [facility]. Suspect hypovolemia may have lead to collapse with aspiration. Will repeat acute diarrhea workup  Will need chronic diarrhea workup.  Consider GI consultation.  Hyperglycemic state. Unlikely DKA as acidosis can be completely accounted for by lactate.  Corrected with fluids alone.  Best practice:  Diet: NPO Pain/Anxiety/Delirium protocol (if indicated): Lorazepam for BIPAP tolerance VAP protocol (if indicated): N/A DVT prophylaxis: Heparin tid GI prophylaxis: Not indicated  Glucose control: Phase 1 Mobility: Ad lib. Code Status: Full Family Communication: Mother updated by phone. Disposition: ICU  Labs   CBC: Recent Labs  Lab 01/13/19 1509 01/13/19 1518  WBC 21.4*  --   NEUTROABS 17.9*  --   HGB 13.9 16.0  HCT 44.6 47.0  MCV 84.5  --   PLT 667*  --     Basic Metabolic Panel: Recent Labs  Lab 01/13/19 1509 01/13/19 1518  NA 130* 130*  K 3.5 4.8  CL 96*  --   CO2 10*  --   GLUCOSE 401*  --   BUN 5*  --   CREATININE 1.59*  --   CALCIUM 8.8*  --    GFR: Estimated Creatinine Clearance: 59 mL/min (A) (by C-G formula based on SCr of 1.59 mg/dL (H)). Recent Labs  Lab 01/13/19 1509  WBC 21.4*  LATICACIDVEN >11.0*    Liver Function Tests: Recent Labs  Lab 01/13/19 1509  AST 119*  ALT 42  ALKPHOS 111  BILITOT 0.7  PROT 6.9  ALBUMIN 3.2*   No results for input(s): LIPASE, AMYLASE in the last 168 hours. No results for input(s): AMMONIA in the last 168 hours.  ABG    Component Value Date/Time   PHART 7.114 (LL) 01/13/2019 1518   PCO2ART 28.9 (L) 01/13/2019 1518   PO2ART 175.0 (H) 01/13/2019 1518   HCO3 9.5 (L) 01/13/2019 1518   TCO2 10 (L) 01/13/2019 1518   ACIDBASEDEF 19.0 (H) 01/13/2019 1518   O2SAT 99.0 01/13/2019 1518     Coagulation Profile: Recent Labs  Lab 01/13/19 1509  INR 1.1    Cardiac Enzymes: No results for input(s): CKTOTAL, CKMB, CKMBINDEX, TROPONINI in the last 168 hours.  HbA1C: Hgb A1c MFr Bld  Date/Time Value Ref Range Status  11/10/2018 06:40 AM 5.5 4.8 - 5.6 % Final    Comment:    (NOTE)         Prediabetes: 5.7 - 6.4         Diabetes: >6.4          Glycemic control for adults with diabetes: <7.0     CBG: Recent Labs  Lab 01/13/19 1459  GLUCAP 403*    Review of Systems:   Review of Systems  All other systems reviewed and are negative.    Past Medical History  He,  has a past medical history of Panic attack.   Surgical History    Past Surgical History:  Procedure Laterality Date  . MULTIPLE EXTRACTIONS WITH ALVEOLOPLASTY N/A 07/29/2014   Procedure: Extraction of tooth #'s 1,15,16,17,18 with alveoloplasty and gross debridement of teeth;  Surgeon: Lenn Cal, DDS;  Location: [facility];  Service: Oral Surgery;  Laterality: N/A;  . NECK SURGERY       Social History   reports that he has been smoking cigarettes. He has never used smokeless tobacco. He reports current alcohol use. He reports that he does not use drugs.   Family History   His family history includes Hypertension in his maternal grandmother.  Allergies No Known Allergies   Home Medications  Prior to Admission medications   Medication Sig Start Date End Date Taking? Authorizing Provider  benztropine (COGENTIN) 1 MG tablet Take 1 tablet (1 mg total) by mouth 2 (two) times daily. Patient not taking: Reported on 12/05/2018 11/13/18   Johnn Hai, MD  haloperidol (HALDOL) 5 MG tablet Take 3 tablets (15 mg total) by mouth at bedtime. Patient not taking: Reported on 12/05/2018 11/13/18   Johnn Hai, MD  haloperidol decanoate (HALDOL DECANOATE) 100 MG/ML injection Inject 1 mL (100 mg total) into the muscle every 30 (thirty) days. Due on/approx 8/13 Patient not taking: Reported on 12/05/2018 11/13/18   Johnn Hai, MD  traZODone (DESYREL) 150 MG tablet Take 1 tablet (150 mg total) by mouth at bedtime and may repeat dose one time if needed. Patient not taking: Reported on 12/05/2018 11/13/18   Johnn Hai, MD      CRITICAL CARE Performed by: Kipp Brood   Total critical care time: 45 minutes  Critical care time was exclusive of separately billable  procedures and treating other patients.  Critical care was necessary to treat or prevent imminent or life-threatening deterioration.  Critical care was time spent personally by me on the following activities: development of treatment plan with patient and/or surrogate as well as nursing, discussions with consultants, evaluation of patient's response to treatment, examination of patient, obtaining history from patient or surrogate, ordering and performing treatments and interventions, ordering and review of laboratory studies, ordering and review of radiographic studies, pulse oximetry, re-evaluation of patient's condition and participation in multidisciplinary rounds.  Kipp Brood, MD [facility] ICU Physician [place]  Pager: [phone] Mobile: [phone] After hours: [phone].  01/13/2019, 5:24 PM

## 2019-01-13 NOTE — ED Notes (Signed)
Unable to draw 2nd set of cultures. Pt stuck multiple times without success

## 2019-01-13 NOTE — ED Triage Notes (Signed)
Patient arrived in acute resp. Distress from home. EMS reports that patient found lying in floor with 1 episode of emesis. Patient with sats initially 70-80s with HR 160s. Patient arrived on CPAP-denied pain. Patient alert, course breath sounds, no hx of injury and no hx of pulmonary illness.

## 2019-01-13 NOTE — Progress Notes (Signed)
Critical ABG results given to Dr Alvino Chapel at 1520. PH- 7.11 PCO2- 28.9 PO2-175 HCO3-  9.5  Kathie Dike  RRT

## 2019-01-13 NOTE — ED Notes (Signed)
Pt had 1 episode of vomiting. Bipap removed and replaced. 4 mg of Zofran given at 1614 per verbal order by MD Alvino Chapel

## 2019-01-13 NOTE — ED Provider Notes (Signed)
[facility] EMERGENCY DEPARTMENT Provider Note   CSN: [id number] Arrival date & time: 01/13/19  1450     History   Chief Complaint Chief Complaint  Patient presents with  . SOB/ Resp distress    HPI Carl Carey is a 38 y.o. male.    Level 5 caveat due to altered mental status and respiratory distress.  Reportedly family found the patient was found lying on the floor on his side next to some emesis.  Sats in the 70s with a heart rate up to about 160.  Patient cannot really tell me much what happened both being somewhat altered and due to severe respiratory distress on BiPAP.  Sugar of 400 for EMS.  Blood pressure maintained.  Recent admission to [facility] for gastroenteritis and some hyponatremia.  Reportedly had enteritis on CT scan at that time.  Patient requires BiPAP to keep his oxygen levels up here.  As patient's mental status improved states he has been doing well his abdomen is not hurting. HPI  Past Medical History:  Diagnosis Date  . Panic attack     Patient Active Problem List   Diagnosis Date Noted  . Bipolar 1 disorder ([place]) 11/09/2018  . Fever in adult 07/25/2014  . Protein-calorie malnutrition, severe ([place]) 07/23/2014  . Parkinsonian features 07/21/2014  . Dysphagia 07/21/2014  . Microcytic anemia 07/21/2014    Past Surgical History:  Procedure Laterality Date  . MULTIPLE EXTRACTIONS WITH ALVEOLOPLASTY N/A 07/29/2014   Procedure: Extraction of tooth #'s 1,15,16,17,18 with alveoloplasty and gross debridement of teeth;  Surgeon: Lenn Cal, DDS;  Location: [facility];  Service: Oral Surgery;  Laterality: N/A;  . NECK SURGERY          Home Medications    Prior to Admission medications   Medication Sig Start Date End Date Taking? Authorizing Provider  benztropine (COGENTIN) 1 MG tablet Take 1 tablet (1 mg total) by mouth 2 (two) times daily. Patient not taking: Reported on 12/05/2018 11/13/18   Johnn Hai, MD  haloperidol  (HALDOL) 5 MG tablet Take 3 tablets (15 mg total) by mouth at bedtime. Patient not taking: Reported on 12/05/2018 11/13/18   Johnn Hai, MD  haloperidol decanoate (HALDOL DECANOATE) 100 MG/ML injection Inject 1 mL (100 mg total) into the muscle every 30 (thirty) days. Due on/approx 8/13 Patient not taking: Reported on 12/05/2018 11/13/18   Johnn Hai, MD  traZODone (DESYREL) 150 MG tablet Take 1 tablet (150 mg total) by mouth at bedtime and may repeat dose one time if needed. Patient not taking: Reported on 12/05/2018 11/13/18   Johnn Hai, MD    Family History Family History  Problem Relation Age of Onset  . Hypertension Maternal Grandmother     Social History Social History   Tobacco Use  . Smoking status: Current Some Day Smoker    Types: Cigarettes  . Smokeless tobacco: Never Used  Substance Use Topics  . Alcohol use: Yes    Comment: socially  . Drug use: No     Allergies   Patient has no known allergies.   Review of Systems Review of Systems  Unable to perform ROS: Severe respiratory distress     Physical Exam Updated Vital Signs BP (!) 157/93   Pulse (!) 135   Temp (!) 96.3 F (35.7 C) (Temporal)   Resp (!) 36   Ht 5' 8"  (1.727 m)   Wt 65 kg   SpO2 97%   BMI 21.79 kg/m   Physical  Exam Vitals signs and nursing note reviewed.  Constitutional:      Comments: Difficult understand.  Mild somnolence.  HENT:     Head: Atraumatic.  Eyes:     Pupils: Pupils are equal, round, and reactive to light.  Cardiovascular:     Rate and Rhythm: Tachycardia present.  Pulmonary:     Comments: Harsh breath sides of the rhonchi bilaterally mostly on the right base. Abdominal:     Tenderness: There is no abdominal tenderness.  Musculoskeletal:     Right lower leg: No edema.     Left lower leg: No edema.  Skin:    General: Skin is warm.     Capillary Refill: Capillary refill takes less than 2 seconds.  Neurological:     Comments: Awake but difficult to understand.   Mild somnolence.  Moving all extremities.      ED Treatments / Results  Labs (all labs ordered are listed, but only abnormal results are displayed) Labs Reviewed  COMPREHENSIVE METABOLIC PANEL - Abnormal; Notable for the following components:      Result Value   Sodium 130 (*)    Chloride 96 (*)    CO2 10 (*)    Glucose, Bld 401 (*)    BUN 5 (*)    Creatinine, Ser 1.59 (*)    Calcium 8.8 (*)    Albumin 3.2 (*)    AST 119 (*)    GFR calc non Af Amer 55 (*)    Anion gap 24 (*)    All other components within normal limits  CBC WITH DIFFERENTIAL/PLATELET - Abnormal; Notable for the following components:   WBC 21.4 (*)    Platelets 667 (*)    Neutro Abs 17.9 (*)    Abs Immature Granulocytes 1.70 (*)    All other components within normal limits  LACTIC ACID, PLASMA - Abnormal; Notable for the following components:   Lactic Acid, Venous >11.0 (*)    All other components within normal limits  CBG MONITORING, ED - Abnormal; Notable for the following components:   Glucose-Capillary 403 (*)    All other components within normal limits  POCT I-STAT 7, (LYTES, BLD GAS, ICA,H+H) - Abnormal; Notable for the following components:   pH, Arterial 7.114 (*)    pCO2 arterial 28.9 (*)    pO2, Arterial 175.0 (*)    Bicarbonate 9.5 (*)    TCO2 10 (*)    Acid-base deficit 19.0 (*)    Sodium 130 (*)    All other components within normal limits  SARS CORONAVIRUS 2 (HOSPITAL ORDER, [facility])  CULTURE, BLOOD (ROUTINE X 2)  CULTURE, BLOOD (ROUTINE X 2)  URINE CULTURE  ETHANOL  RAPID URINE DRUG SCREEN, HOSP PERFORMED  LACTIC ACID, PLASMA  URINALYSIS, ROUTINE W REFLEX MICROSCOPIC  APTT  PROTIME-INR  I-STAT ARTERIAL BLOOD GAS, ED    EKG EKG Interpretation  Date/Time:  Saturday January 13 2019 14:59:52 EDT Ventricular Rate:  159 PR Interval:    QRS Duration: 86 QT Interval:  263 QTC Calculation: 428 R Axis:   75 Text Interpretation:  Sinus tachycardia  Multiple premature complexes, vent & supraven Aberrant conduction of SV complex(es) Probable left atrial enlargement Probable left ventricular hypertrophy Borderline T abnormalities, inferior leads Confirmed by Davonna Belling [phone]) on 01/13/2019 3:03:15 PM   Radiology Dg Chest Portable 1 View  Result Date: 01/13/2019 CLINICAL DATA:  Acute shortness of breath.  Vomiting. EXAM: PORTABLE CHEST 1 VIEW COMPARISON:  12/10/2017 FINDINGS:  Diffuse airspace opacities within the mid and lower RIGHT lung noted. Equivocal medial LEFT basilar opacity noted. The cardiomediastinal silhouette is unremarkable. No pleural effusion, pneumothorax or acute bony abnormality. IMPRESSION: Diffuse airspace opacities within the mid and lower RIGHT lung and possibly within the LEFT lung base, suspicious for pneumonia/infection or possibly aspiration. Electronically Signed   By: Margarette Canada M.D.   On: 01/13/2019 15:12    Procedures Procedures (including critical care time)  Medications Ordered in ED Medications  cefTRIAXone (ROCEPHIN) 1 g in sodium chloride 0.9 % 100 mL IVPB (0 g Intravenous Stopped 01/13/19 1613)  azithromycin (ZITHROMAX) 500 mg in sodium chloride 0.9 % 250 mL IVPB (500 mg Intravenous New Bag/Given 01/13/19 1532)  LORazepam (ATIVAN) injection 0.5 mg (0.5 mg Intravenous Given 01/13/19 1515)  sodium chloride 0.9 % bolus 1,000 mL (1,000 mLs Intravenous New Bag/Given 01/13/19 1530)  sodium chloride 0.9 % bolus 1,000 mL (1,000 mLs Intravenous New Bag/Given 01/13/19 1613)  ondansetron (ZOFRAN) 4 MG/2ML injection (4 mg  Given 01/13/19 1614)     Initial Impression / Assessment and Plan / ED Course  I have reviewed the triage vital signs and the nursing notes.  Pertinent labs & imaging results that were available during my care of the patient were reviewed by me and considered in my medical decision making (see chart for details).        Patient brought in with respiratory failure.  Bilateral pneumonia on  x-ray.  Potentially could have aspiration with that since found on the side after vomiting.  Persistent tachycardia.  Lactate of greater than 11.  Antibiotics given.  Fluid bolus given.  Code sepsis called but had already had orders for the fluid bolus of greater than 30/kg.  Has sugar of 400 and is not known to be diabetic.  Also anion gap.  Could be metabolic but also potentially could be DKA.  With multiple organ systems involved with mild AKI also.  Will discuss with intensivist about admission to ICU.  CRITICAL CARE Performed by: Davonna Belling Total critical care time: 30 minutes Critical care time was exclusive of separately billable procedures and treating other patients. Critical care was necessary to treat or prevent imminent or life-threatening deterioration. Critical care was time spent personally by me on the following activities: development of treatment plan with patient and/or surrogate as well as nursing, discussions with consultants, evaluation of patient's response to treatment, examination of patient, obtaining history from patient or surrogate, ordering and performing treatments and interventions, ordering and review of laboratory studies, ordering and review of radiographic studies, pulse oximetry and re-evaluation of patient's condition.   Final Clinical Impressions(s) / ED Diagnoses   Final diagnoses:  Acute respiratory failure with hypoxia (HCC)  Multifocal pneumonia  AKI (acute kidney injury) ([place])  Lactic acidosis  Hyperglycemia    ED Discharge Orders    None       Davonna Belling, MD 01/13/19 1641

## 2019-01-14 DIAGNOSIS — E878 Other disorders of electrolyte and fluid balance, not elsewhere classified: Secondary | ICD-10-CM

## 2019-01-14 DIAGNOSIS — J9601 Acute respiratory failure with hypoxia: Secondary | ICD-10-CM

## 2019-01-14 DIAGNOSIS — R6521 Severe sepsis with septic shock: Secondary | ICD-10-CM

## 2019-01-14 DIAGNOSIS — K529 Noninfective gastroenteritis and colitis, unspecified: Secondary | ICD-10-CM

## 2019-01-14 DIAGNOSIS — R740 Nonspecific elevation of levels of transaminase and lactic acid dehydrogenase [LDH]: Secondary | ICD-10-CM

## 2019-01-14 LAB — CBC
HCT: 38.8 % — ABNORMAL LOW (ref 39.0–52.0)
Hemoglobin: 12.6 g/dL — ABNORMAL LOW (ref 13.0–17.0)
MCH: 26.1 pg (ref 26.0–34.0)
MCHC: 32.5 g/dL (ref 30.0–36.0)
MCV: 80.5 fL (ref 80.0–100.0)
Platelets: 440 10*3/uL — ABNORMAL HIGH (ref 150–400)
RBC: 4.82 MIL/uL (ref 4.22–5.81)
RDW: 13.2 % (ref 11.5–15.5)
WBC: 46.8 10*3/uL — ABNORMAL HIGH (ref 4.0–10.5)
nRBC: 0 % (ref 0.0–0.2)

## 2019-01-14 LAB — RESPIRATORY PANEL BY PCR

## 2019-01-14 LAB — PROTIME-INR
INR: 1.3 — ABNORMAL HIGH (ref 0.8–1.2)
Prothrombin Time: 15.7 seconds — ABNORMAL HIGH (ref 11.4–15.2)

## 2019-01-14 LAB — GLUCOSE, CAPILLARY
Glucose-Capillary: 113 mg/dL — ABNORMAL HIGH (ref 70–99)
Glucose-Capillary: 98 mg/dL (ref 70–99)
Glucose-Capillary: 113 mg/dL — ABNORMAL HIGH (ref 70–99)
Glucose-Capillary: 102 mg/dL — ABNORMAL HIGH (ref 70–99)
Glucose-Capillary: 98 mg/dL (ref 70–99)

## 2019-01-14 LAB — COMPREHENSIVE METABOLIC PANEL
ALT: 36 U/L (ref 0–44)
AST: 50 U/L — ABNORMAL HIGH (ref 15–41)
Albumin: 2.2 g/dL — ABNORMAL LOW (ref 3.5–5.0)
Alkaline Phosphatase: 70 U/L (ref 38–126)
Anion gap: 7 (ref 5–15)
BUN: <5 mg/dL — ABNORMAL LOW (ref 6–20)
CO2: 21 mmol/L — ABNORMAL LOW (ref 22–32)
Calcium: 7.7 mg/dL — ABNORMAL LOW (ref 8.9–10.3)
Chloride: 109 mmol/L (ref 98–111)
Creatinine, Ser: 0.86 mg/dL (ref 0.61–1.24)
GFR calc Af Amer: >60 mL/min (ref >60)
GFR calc non Af Amer: >60 mL/min (ref >60)
Glucose, Bld: 110 mg/dL — ABNORMAL HIGH (ref 70–99)
Potassium: 3.2 mmol/L — ABNORMAL LOW (ref 3.5–5.1)
Sodium: 137 mmol/L (ref 135–145)
Total Bilirubin: 1.1 mg/dL (ref 0.3–1.2)
Total Protein: 4.9 g/dL — ABNORMAL LOW (ref 6.5–8.1)

## 2019-01-14 LAB — MRSA PCR SCREENING: MRSA by PCR: NEGATIVE

## 2019-01-14 LAB — GASTROINTESTINAL PANEL BY PCR, STOOL (REPLACES STOOL CULTURE)

## 2019-01-14 LAB — PHOSPHORUS: Phosphorus: 2.9 mg/dL (ref 2.5–4.6)

## 2019-01-14 LAB — HIV ANTIBODY (ROUTINE TESTING W REFLEX): HIV Screen 4th Generation wRfx: NONREACTIVE

## 2019-01-14 LAB — MAGNESIUM: Magnesium: 1.6 mg/dL — ABNORMAL LOW (ref 1.7–2.4)

## 2019-01-14 LAB — STREP PNEUMONIAE URINARY ANTIGEN: Strep Pneumo Urinary Antigen: NEGATIVE

## 2019-01-14 LAB — PROCALCITONIN: Procalcitonin: 4.38 ng/mL

## 2019-01-14 MED ORDER — POTASSIUM CHLORIDE 10 MEQ/100ML IV SOLN
10.0000 meq | INTRAVENOUS | Status: AC
  Administered 2019-01-14 (×4): 10 meq via INTRAVENOUS
  Filled 2019-01-14 (×4): qty 100

## 2019-01-14 MED ORDER — LACTATED RINGERS IV BOLUS
1000.0000 mL | Freq: Once | INTRAVENOUS | Status: AC
  Administered 2019-01-14: 19:00:00 1000 mL via INTRAVENOUS

## 2019-01-14 MED ORDER — LOPERAMIDE HCL 1 MG/7.5ML PO SUSP
1.0000 mg | Freq: Once | ORAL | Status: AC
  Administered 2019-01-14: 1 mg via ORAL
  Filled 2019-01-14: qty 7.5

## 2019-01-14 MED ORDER — LACTATED RINGERS IV BOLUS
1000.0000 mL | Freq: Once | INTRAVENOUS | Status: AC
  Administered 2019-01-14: 1000 mL via INTRAVENOUS

## 2019-01-14 MED ORDER — CHLORHEXIDINE GLUCONATE CLOTH 2 % EX PADS
6.0000 | MEDICATED_PAD | Freq: Every day | CUTANEOUS | Status: DC
  Administered 2019-01-14 – 2019-01-25 (×11): 6 via TOPICAL

## 2019-01-14 MED ORDER — SODIUM CHLORIDE 0.9 % IV BOLUS
1000.0000 mL | Freq: Once | INTRAVENOUS | Status: AC
  Administered 2019-01-14: 21:00:00 1000 mL via INTRAVENOUS

## 2019-01-14 MED ORDER — DEXTROSE IN LACTATED RINGERS 5 % IV SOLN
INTRAVENOUS | Status: DC
  Administered 2019-01-14 (×2): via INTRAVENOUS

## 2019-01-14 MED ORDER — MAGNESIUM SULFATE 2 GM/50ML IV SOLN
2.0000 g | Freq: Once | INTRAVENOUS | Status: AC
  Administered 2019-01-14: 16:00:00 2 g via INTRAVENOUS
  Filled 2019-01-14: qty 50

## 2019-01-14 NOTE — Progress Notes (Addendum)
NAME:  Carl Carey, MRN:  [id number], DOB:  30-Oct-1980, LOS: 1 ADMISSION DATE:  01/13/2019, CONSULTATION DATE:  01/13/2019 REFERRING MD:  Alvino Chapel - ED [facility], CHIEF COMPLAINT:  Respiratory failure.   Brief History    Extensive psychiatric history with admissions for BAD.   Recently seen at [facility] for abdominal pain 01/04/2019. Presented with several months of diarrhea and decreased appetite. Stool culture and C diff were negative.  He was febrile at that time but CT consistent with non-specific enteritis.  He was treated with fluids and discharged.  Found today by family, lethargic near pool of emesis. Significantly altered in ED. Markedly elevated lactate, leukocytosis.  Code SEPSIS - given fluids, started on CAP coverage. CCM - performed a bedside echo showing normal LV/RV function. Heart is underfilled. No valvular lesions. Near complete collapse of IVC. Interstitial pattern R lung. No consolidation.   Past Medical History   Past Medical History:  Diagnosis Date   Bipolar 1 disorder ([place])    Diabetes ([place])    Malnutrition ([place])    Panic attack    Parkinsonism ([place])     Significant Hospital Events   Admitted 9/12 - Looks better than on arrival according to RN. Patient more interactive. Continues to have diarrhea. Denies pain.  Consults:  PCCM  Procedures:  BiPAP - 9/12  Significant Diagnostic Tests:  CXR - shows diffuse opacities right lower lung, possible consolidation at left base.  Micro Data:  9/12  SARS-CoV2 - negative. 9/12 - stool PCR 9/13 - RVP 9/13 - urine strep 9/14 - urine leg  Antimicrobials:  Azithromycin 9/12 Ceftriaxone 9/12   Interim history/subjective:    01/14/2019 - AKI resolved. Lactic acidosis resolvee PCT this Am is 4+. . Mild low K +. LFT better.  On LR. Off pressors (never on). Off bipap x 8h. RN thinks patient meets transfer eligibility criteria.  However, febrile to 101.49F and WBC 40 . On 4L [place] and comfortable at 100%pulse ox. HR 120/min  sinus  Diarrhea + - dark green/blackish per RN.    Objective   Blood pressure 107/71, pulse (!) 124, temperature (!) 101.6 F (38.7 C), temperature source Oral, resp. rate 15, height 5' 8"  (1.727 m), weight 65 kg, SpO2 95 %.    Vent Mode: BIPAP FiO2 (%):  [60 %-100 %] 60 % PEEP:  [6 cmH20] 6 cmH20   Intake/Output Summary (Last 24 hours) at 01/14/2019 0842 Last data filed at 01/14/2019 0800 Gross per 24 hour  Intake 4191.66 ml  Output 3425 ml  Net 766.66 ml   Filed Weights   01/13/19 1400  Weight: 65 kg    General Appearance:  Looks stable. Lying lateral and trying to sleep with blanket on Head:  Normocephalic, without obvious abnormality, atraumatic Eyes:  PERRL - yes, conjunctiva/corneas - muddy     Ears:  Normal external ear canals, both ears Nose:  G tube - no but has 4L [place] without distress . Pulse ox 100% Throat:  ETT TUBE - no , OG tube - *no Neck:  Supple,  No enlargement/tenderness/nodules Lungs: No distress. Scattered crackles Heart:  S1 and S2 normal, no murmur, CVP - no.  Pressors - no Abdomen:  Soft, no masses, no organomegaly Genitalia / Rectal:  Not done Extremities:  Extremities- intact Skin:  ntact in exposed areas . Sacral area - has tegaderm Neurologic:  Sedation - none -> RASS - sleeping . Moves all 4s - yes. CAM-ICU - not testeed . Orientation - not tested, stutters,  Resolved Hospital Problem list   n/a  Assessment & Plan:    Lactic acidosis  01/14/2019 - resolved  Plan monitor   Circulatory shock = Suspect hypovolemia may have lead to collapse with aspiration.  01/14/2019 - not on pressors. Per RN Not needed pressrs since arrival to ICU 01/13/2019  Plan MAP goal > 65 Cotninue d5LR but at 75cc/h Fluid bolus x 1   Acute hypoxic respiratory failure with increase work of breathing requiring due to metabolic acidosis requiring BiPAP  01/14/2019 - normal WOB with 4L [place] at 100%. Bipap off x 8h   plan O2 for pule ox > 88% - probably  can be weaned off to [place]  01/14/2019 - sounds mostly like aspiration pneumonia. . Still febrile but lactate resolved  Plan  - change ceftriazone to Unasyn - continue azithromycin but dc if urine legionella negative or improving with unasyn coverage     - monitor QTc (428 msec on 01/13/2019)  - monitor PCT  Abd pain and Chronic diarrhea with recent investigation at [facility].  01/14/2019 - resolved lactic acidosis and no abd tenderness  Plan  - await stool pcr for diarrha - Will need chronic diarrhea workup. - Consider GI consultation in hospital depending on course   Baseline psych history - lives with mom  Plan - restart psycgh meds when availble  Electrolyte imbalance  01/14/2019 - low K  Plan  - K repletion in progress - check mag and phos - add to AM labs and morning labs   Transaminitis  9/13 - improving  Plan  - monitor  Best practice:  Diet: NPO- can try to advance food depends on his condition when gets up from sleep Pain/Anxiety/Delirium protocol (if indicated): none VAP protocol (if indicated): N/A DVT prophylaxis: Heparin tid GI prophylaxis: Not indicated  Glucose control: Phase 1 Mobility: Ad lib. Code Status: Full Family Communication: Mother updated by phone. Disposition: move to tele 01/14/2019 and cc   PCCM off and TRH primary from 01/15/2019. - d/w Adikari      ATTESTATION & SIGNATURE    Dr. Brand Males, M.D., [facility].C.P Pulmonary and Critical Care Medicine Staff Physician [facility] [place] [facility] Pager: [phone], If no answer or between  15:00h - 7:00h: call [phone]  01/14/2019 8:44 AM     LABS forf REFERENCE    PULMONARY Recent Labs  Lab 01/13/19 1518 01/13/19 1816  PHART 7.114* 7.216*  PCO2ART 28.9* 34.3  PO2ART 175.0* 88.0  HCO3 9.5* 13.9*  TCO2 10* 15*  O2SAT 99.0 95.0    CBC Recent Labs  Lab 01/13/19 1509 01/13/19 1518 01/13/19 1816  01/14/19 0511  HGB 13.9 16.0 13.6 12.6*  HCT 44.6 47.0 40.0 38.8*  WBC 21.4*  --   --  46.8*  PLT 667*  --   --  440*    COAGULATION Recent Labs  Lab 01/13/19 1509 01/14/19 0511  INR 1.1 1.3*    CARDIAC  No results for input(s): TROPONINI in the last 168 hours. No results for input(s): PROBNP in the last 168 hours.   CHEMISTRY Recent Labs  Lab 01/13/19 1509 01/13/19 1518 01/13/19 1816 01/14/19 0511  NA 130* 130* 135 137  K 3.5 4.8 3.0* 3.2*  CL 96*  --   --  109  CO2 10*  --   --  21*  GLUCOSE 401*  --   --  110*  BUN 5*  --   --  <  5*  CREATININE 1.59*  --   --  0.86  CALCIUM 8.8*  --   --  7.7*   Estimated Creatinine Clearance: 109.2 mL/min (by C-G formula based on SCr of 0.86 mg/dL).   LIVER Recent Labs  Lab 01/13/19 1509 01/14/19 0511  AST 119* 50*  ALT 42 36  ALKPHOS 111 70  BILITOT 0.7 1.1  PROT 6.9 4.9*  ALBUMIN 3.2* 2.2*  INR 1.1 1.3*     INFECTIOUS Recent Labs  Lab 01/13/19 1721 01/13/19 1942 01/13/19 2259 01/14/19 0511  LATICACIDVEN 7.0* 3.9* 1.9  --   PROCALCITON 0.10  --   --  4.38     ENDOCRINE CBG (last 3)  Recent Labs    01/13/19 2345 01/14/19 0325 01/14/19 0754  GLUCAP 113* 113* 98         IMAGING x48h  - image(s) personally visualized  -   highlighted in bold Dg Chest Portable 1 View  Result Date: 01/13/2019 CLINICAL DATA:  Acute shortness of breath.  Vomiting. EXAM: PORTABLE CHEST 1 VIEW COMPARISON:  12/10/2017 FINDINGS: Diffuse airspace opacities within the mid and lower RIGHT lung noted. Equivocal medial LEFT basilar opacity noted. The cardiomediastinal silhouette is unremarkable. No pleural effusion, pneumothorax or acute bony abnormality. IMPRESSION: Diffuse airspace opacities within the mid and lower RIGHT lung and possibly within the LEFT lung base, suspicious for pneumonia/infection or possibly aspiration. Electronically Signed   By: Margarette Canada M.D.   On: 01/13/2019 15:12

## 2019-01-14 NOTE — Progress Notes (Signed)
Received pt from ED. Pt on bipap, rr 30s, st 140s, appears calm. Notified elink, camera visual done by elink nurse Jody. Jeral Fruit will inform md of pt condition.

## 2019-01-14 NOTE — Progress Notes (Signed)
   01/14/19 2304  Vitals  Temp (!) 100.9 F (38.3 C)  Temp Source Oral  BP 135/75  MAP (mmHg) 91  BP Location Right Arm  BP Method Automatic  Patient Position (if appropriate) Sitting  Pulse Rate (!) 123  Pulse Rate Source Dinamap  Resp 14  Oxygen Therapy  SpO2 98 %  O2 Device Nasal Cannula  O2 Flow Rate (L/min) 2 L/min  MEWS Score  MEWS RR 0  MEWS Pulse 2  MEWS Systolic 0  MEWS LOC 0  MEWS Temp 1  MEWS Score 3  MEWS Score Color Yellow  MEWS Assessment  Is this an acute change? No   Pt arrived to [address] [place] from [place] at 2300.  Pt is Alert and Oriented X4.  Has a studder when he speaks.  His MEWS score is a 5 right now.  According to notes MD is aware of patients condition.  Temperature is down from 103.1.  Pt is still Tachycardic.  Skin is in good condition. Oriented patient to unit.  Set him up on telemetry.  Will continue to monitor patient.

## 2019-01-14 NOTE — Progress Notes (Signed)
eLink Physician-Brief Progress Note Patient Name: Carl Carey DOB: 17-Dec-1980 MRN: [id number]   Date of Service  01/14/2019  HPI/Events of Note  Multiple issues: Fever to 103.1 F - Patient is currently getting Azithromycin and Tylenol, 2. Tachycardia - HR = 132. Sinus Tachycardia and 3. K+ replaced this AM and no repeat K+ lab ordered.   eICU Interventions  Will order: 1. BMP now. 2. Bolus with 0.9 NaCl 1 liter IV over 1 hour now. 3. BMP now.  4. Blood cultures X 2 now.      Intervention Category Major Interventions: Infection - evaluation and management  Sommer,Steven Eugene 01/14/2019, 9:03 PM

## 2019-01-14 NOTE — Progress Notes (Signed)
.  [facility] ADULT ICU REPLACEMENT PROTOCOL FOR AM LAB REPLACEMENT ONLY  The patient does apply for the [facility] Adult ICU Electrolyte Replacment Protocol based on the criteria listed below:   1. Is GFR >/= 40 ml/min? Yes.    Patient's GFR today is >60 2. Is urine output >/= 0.5 ml/kg/hr for the last 6 hours? Yes.   Patient's UOP is 8.01 ml/kg/hr 3. Is BUN < 60 mg/dL? Yes.    Patient's BUN today is <5 4. Abnormal electrolyte(s): K+ 3.2 5. Ordered repletion with: protocol 6. If a panic level lab has been reported, has the CCM MD in charge been notified? Yes.  .   Physician:  Dr. Willaim Bane, [place] 01/14/2019 6:20 AM

## 2019-01-14 NOTE — Progress Notes (Signed)
Attempted to update mother on patients condition, unable to reach her did not leave a voicemail because voicemail box not set up

## 2019-01-15 ENCOUNTER — Inpatient Hospital Stay (HOSPITAL_COMMUNITY): Payer: Medicaid Other

## 2019-01-15 DIAGNOSIS — R509 Fever, unspecified: Secondary | ICD-10-CM

## 2019-01-15 DIAGNOSIS — D72829 Elevated white blood cell count, unspecified: Secondary | ICD-10-CM

## 2019-01-15 DIAGNOSIS — F319 Bipolar disorder, unspecified: Secondary | ICD-10-CM

## 2019-01-15 DIAGNOSIS — F3173 Bipolar disorder, in partial remission, most recent episode manic: Secondary | ICD-10-CM

## 2019-01-15 DIAGNOSIS — J159 Unspecified bacterial pneumonia: Secondary | ICD-10-CM

## 2019-01-15 DIAGNOSIS — R4182 Altered mental status, unspecified: Secondary | ICD-10-CM

## 2019-01-15 LAB — CBC WITH DIFFERENTIAL/PLATELET
Abs Immature Granulocytes: 0.13 10*3/uL — ABNORMAL HIGH (ref 0.00–0.07)
Basophils Absolute: 0.1 10*3/uL (ref 0.0–0.1)
Basophils Relative: 0 %
Eosinophils Absolute: 0 10*3/uL (ref 0.0–0.5)
Eosinophils Relative: 0 %
HCT: 30.5 % — ABNORMAL LOW (ref 39.0–52.0)
Hemoglobin: 10.2 g/dL — ABNORMAL LOW (ref 13.0–17.0)
Immature Granulocytes: 1 %
Lymphocytes Relative: 1 %
Lymphs Abs: 0.3 10*3/uL — ABNORMAL LOW (ref 0.7–4.0)
MCH: 26.6 pg (ref 26.0–34.0)
MCHC: 33.4 g/dL (ref 30.0–36.0)
MCV: 79.6 fL — ABNORMAL LOW (ref 80.0–100.0)
Monocytes Absolute: 0.8 10*3/uL (ref 0.1–1.0)
Monocytes Relative: 3 %
Neutro Abs: 23 10*3/uL — ABNORMAL HIGH (ref 1.7–7.7)
Neutrophils Relative %: 95 %
Platelets: 351 10*3/uL (ref 150–400)
RBC: 3.83 MIL/uL — ABNORMAL LOW (ref 4.22–5.81)
RDW: 13.7 % (ref 11.5–15.5)
WBC: 24.2 10*3/uL — ABNORMAL HIGH (ref 4.0–10.5)
nRBC: 0 % (ref 0.0–0.2)

## 2019-01-15 LAB — BASIC METABOLIC PANEL
Anion gap: 7 (ref 5–15)
BUN: <5 mg/dL — ABNORMAL LOW (ref 6–20)
CO2: 22 mmol/L (ref 22–32)
Calcium: 7.2 mg/dL — ABNORMAL LOW (ref 8.9–10.3)
Chloride: 106 mmol/L (ref 98–111)
Creatinine, Ser: 0.7 mg/dL (ref 0.61–1.24)
GFR calc Af Amer: >60 mL/min (ref >60)
GFR calc non Af Amer: >60 mL/min (ref >60)
Glucose, Bld: 137 mg/dL — ABNORMAL HIGH (ref 70–99)
Potassium: 3.2 mmol/L — ABNORMAL LOW (ref 3.5–5.1)
Sodium: 135 mmol/L (ref 135–145)

## 2019-01-15 LAB — PROCALCITONIN: Procalcitonin: 3.34 ng/mL

## 2019-01-15 LAB — GLUCOSE, CAPILLARY
Glucose-Capillary: 152 mg/dL — ABNORMAL HIGH (ref 70–99)
Glucose-Capillary: 115 mg/dL — ABNORMAL HIGH (ref 70–99)
Glucose-Capillary: 96 mg/dL (ref 70–99)
Glucose-Capillary: 107 mg/dL — ABNORMAL HIGH (ref 70–99)
Glucose-Capillary: 94 mg/dL (ref 70–99)

## 2019-01-15 LAB — LACTIC ACID, PLASMA: Lactic Acid, Venous: 1.2 mmol/L (ref 0.5–1.9)

## 2019-01-15 LAB — HEPATIC FUNCTION PANEL
ALT: 28 U/L (ref 0–44)
AST: 53 U/L — ABNORMAL HIGH (ref 15–41)
Albumin: 1.9 g/dL — ABNORMAL LOW (ref 3.5–5.0)
Alkaline Phosphatase: 58 U/L (ref 38–126)
Bilirubin, Direct: 0.2 mg/dL (ref 0.0–0.2)
Indirect Bilirubin: 0.5 mg/dL (ref 0.3–0.9)
Total Bilirubin: 0.7 mg/dL (ref 0.3–1.2)
Total Protein: 4.5 g/dL — ABNORMAL LOW (ref 6.5–8.1)

## 2019-01-15 LAB — PROTIME-INR
INR: 1.4 — ABNORMAL HIGH (ref 0.8–1.2)
Prothrombin Time: 17.2 seconds — ABNORMAL HIGH (ref 11.4–15.2)

## 2019-01-15 LAB — URINE CULTURE: Culture: 60000 — AB

## 2019-01-15 LAB — ECHOCARDIOGRAM COMPLETE
Height: 68 in
Weight: 2292.78 oz

## 2019-01-15 LAB — MAGNESIUM: Magnesium: 1.7 mg/dL (ref 1.7–2.4)

## 2019-01-15 LAB — PHOSPHORUS: Phosphorus: 1.4 mg/dL — ABNORMAL LOW (ref 2.5–4.6)

## 2019-01-15 MED ORDER — SODIUM CHLORIDE 0.9 % IV SOLN
1.0000 g | Freq: Every day | INTRAVENOUS | Status: DC
  Administered 2019-01-15 – 2019-01-17 (×3): 1 g via INTRAVENOUS
  Filled 2019-01-15 (×3): qty 10

## 2019-01-15 MED ORDER — K PHOS MONO-SOD PHOS DI & MONO 155-852-130 MG PO TABS
250.0000 mg | ORAL_TABLET | Freq: Two times a day (BID) | ORAL | Status: AC
  Administered 2019-01-15 – 2019-01-17 (×4): 250 mg via ORAL
  Filled 2019-01-15 (×4): qty 1

## 2019-01-15 MED ORDER — POTASSIUM CHLORIDE CRYS ER 20 MEQ PO TBCR
40.0000 meq | EXTENDED_RELEASE_TABLET | ORAL | Status: AC
  Administered 2019-01-15 (×2): 40 meq via ORAL
  Filled 2019-01-15 (×2): qty 2

## 2019-01-15 MED ORDER — SODIUM CHLORIDE 0.9 % IV BOLUS: 500.0000 mL | Freq: Once | INTRAVENOUS | Status: DC

## 2019-01-15 MED ORDER — HYDROXYZINE HCL 25 MG PO TABS
25.0000 mg | ORAL_TABLET | Freq: Four times a day (QID) | ORAL | Status: DC | PRN
  Administered 2019-01-16: 25 mg via ORAL
  Filled 2019-01-15: qty 1

## 2019-01-15 MED ORDER — CITALOPRAM HYDROBROMIDE 20 MG PO TABS
20.0000 mg | ORAL_TABLET | Freq: Every day | ORAL | Status: DC
  Administered 2019-01-15 – 2019-01-24 (×10): 20 mg via ORAL
  Filled 2019-01-15 (×10): qty 1

## 2019-01-15 MED ORDER — INFLUENZA VAC SPLIT QUAD 0.5 ML IM SUSY
0.5000 mL | PREFILLED_SYRINGE | INTRAMUSCULAR | Status: DC
  Filled 2019-01-15: qty 0.5

## 2019-01-15 MED ORDER — SODIUM CHLORIDE 0.9 % IV BOLUS
1000.0000 mL | Freq: Once | INTRAVENOUS | Status: AC
  Administered 2019-01-15: 1000 mL via INTRAVENOUS

## 2019-01-15 MED ORDER — BENZTROPINE MESYLATE 1 MG PO TABS
1.0000 mg | ORAL_TABLET | Freq: Every day | ORAL | Status: DC
  Administered 2019-01-16 – 2019-01-25 (×10): 1 mg via ORAL
  Filled 2019-01-15 (×10): qty 1

## 2019-01-15 MED ORDER — IBUPROFEN 400 MG PO TABS
400.0000 mg | ORAL_TABLET | Freq: Four times a day (QID) | ORAL | Status: DC | PRN
  Administered 2019-01-15 – 2019-01-16 (×3): 400 mg via ORAL
  Filled 2019-01-15 (×3): qty 1

## 2019-01-15 MED ORDER — GUAIFENESIN 100 MG/5ML PO SOLN
5.0000 mL | ORAL | Status: DC | PRN
  Administered 2019-01-15: 100 mg via ORAL
  Filled 2019-01-15: qty 5

## 2019-01-15 MED ORDER — BENZTROPINE MESYLATE 1 MG PO TABS
1.0000 mg | ORAL_TABLET | Freq: Two times a day (BID) | ORAL | Status: DC
  Administered 2019-01-15: 1 mg via ORAL
  Filled 2019-01-15: qty 1

## 2019-01-15 MED ORDER — ARIPIPRAZOLE 10 MG PO TABS
10.0000 mg | ORAL_TABLET | Freq: Every day | ORAL | Status: DC
  Administered 2019-01-15 – 2019-01-24 (×10): 10 mg via ORAL
  Filled 2019-01-15 (×12): qty 1

## 2019-01-15 MED ORDER — TRAZODONE HCL 50 MG PO TABS
150.0000 mg | ORAL_TABLET | Freq: Once | ORAL | Status: AC
  Administered 2019-01-15: 150 mg via ORAL
  Filled 2019-01-15: qty 3

## 2019-01-15 MED ORDER — HALOPERIDOL 5 MG PO TABS
15.0000 mg | ORAL_TABLET | Freq: Every day | ORAL | Status: DC
  Filled 2019-01-15: qty 3

## 2019-01-15 MED ORDER — TRAZODONE HCL 50 MG PO TABS
200.0000 mg | ORAL_TABLET | Freq: Every day | ORAL | Status: DC
  Administered 2019-01-15 – 2019-01-24 (×10): 200 mg via ORAL
  Filled 2019-01-15 (×10): qty 4

## 2019-01-15 MED ORDER — TRAZODONE HCL 50 MG PO TABS: 150.0000 mg | ORAL_TABLET | Freq: Every evening | ORAL | Status: DC | PRN

## 2019-01-15 MED ORDER — SODIUM CHLORIDE 0.9 % IV SOLN
INTRAVENOUS | Status: DC
  Administered 2019-01-15 – 2019-01-21 (×10): via INTRAVENOUS

## 2019-01-15 NOTE — Evaluation (Signed)
Clinical/Bedside Swallow Evaluation Patient Details  Name: Carl Carey MRN: [id number] Date of Birth: Oct 03, 1980  Today's Date: 01/15/2019 Time: SLP Start Time (ACUTE ONLY): 0854 SLP Stop Time (ACUTE ONLY): 0912 SLP Time Calculation (min) (ACUTE ONLY): 18 min  Past Medical History:  Past Medical History:  Diagnosis Date  . Bipolar 1 disorder ([place])   . Diabetes ([place])   . Malnutrition ([place])   . Panic attack   . Parkinsonism [facility])    Past Surgical History:  Past Surgical History:  Procedure Laterality Date  . MULTIPLE EXTRACTIONS WITH ALVEOLOPLASTY N/A 07/29/2014   Procedure: Extraction of tooth #'s 1,15,16,17,18 with alveoloplasty and gross debridement of teeth;  Surgeon: Lenn Cal, DDS;  Location: [facility];  Service: Oral Surgery;  Laterality: N/A;  . NECK SURGERY     HPI:  Pt is a 38 yo male presenting with AMS and vomiting, admitted with septic shock and respiratory failure requiring BiPAP. CXR concerning for PNA and possible aspiration. Pt was evaluated by SLP in 2016, initially NPO but then advancing to Dys 2 solids, thin liquids primarily due to mentation. PMH: parkinsonism, panic attack, malnutrition, DM, bipolar 1 d/o   Assessment / Plan / Recommendation Clinical Impression  Pt provides varying descriptions of dysphagia. He denies any sensory changes during oral motor exam, with strength and ROM appearing mildly reduced yet symmetrical. Later, he describes feeling numb in his face, neck, arms, and legs bilaterally. When questioned again, he describes it as weakness > numbness. He has a stuttering quality to his speech, which he reports as an intermittent problem that occurs primarily when he is in the hospital. Pt consumed a variety of consistencies without overt signs of aspiration or dysphagia. He clears his mouth well after each bite and there is no anterior loss observed. Despite slow but seemingly adequate function, pt subjectively says that he cannot chew and asks me to  insert a "muscle in his gums" to help him. Will adjust his diet to Dys 2 textures per his preference, still allowing thin liquids. Will f/u for tolerance and potential to advance.   SLP Visit Diagnosis: Dysphagia, oral phase (R13.11)    Aspiration Risk  Mild aspiration risk    Diet Recommendation Dysphagia 2 (Fine chop);Thin liquid   Liquid Administration via: Cup;Straw Medication Administration: Whole meds with liquid Supervision: Patient able to self feed;Intermittent supervision to cue for compensatory strategies Compensations: Small sips/bites;Slow rate;Minimize environmental distractions Postural Changes: Seated upright at 90 degrees    Other  Recommendations Oral Care Recommendations: Oral care BID   Follow up Recommendations (tba)      Frequency and Duration min 2x/week  2 weeks       Prognosis Prognosis for Safe Diet Advancement: Good Barriers to Reach Goals: Cognitive deficits      Swallow Study   General HPI: Pt is a 38 yo male presenting with AMS and vomiting, admitted with septic shock and respiratory failure requiring BiPAP. CXR concerning for PNA and possible aspiration. Pt was evaluated by SLP in 2016, initially NPO but then advancing to Dys 2 solids, thin liquids primarily due to mentation. PMH: parkinsonism, panic attack, malnutrition, DM, bipolar 1 d/o Type of Study: Bedside Swallow Evaluation Previous Swallow Assessment: see HPI Diet Prior to this Study: Regular;Thin liquids Temperature Spikes Noted: Yes Respiratory Status: Nasal cannula History of Recent Intubation: No Behavior/Cognition: Alert;Cooperative;Other (Comment)(flat affect; stuttering) Oral Cavity Assessment: Within Functional Limits Oral Care Completed by SLP: No Oral Cavity - Dentition: Poor condition;Missing dentition Vision: Functional for  self-feeding Self-Feeding Abilities: Able to feed self Patient Positioning: Upright in bed Baseline Vocal Quality: Normal Volitional Swallow: Able to  elicit    Oral/Motor/Sensory Function Overall Oral Motor/Sensory Function: Generalized oral weakness   Ice Chips Ice chips: Not tested   Thin Liquid Thin Liquid: Within functional limits Presentation: Straw    Nectar Thick Nectar Thick Liquid: Not tested   Honey Thick Honey Thick Liquid: Not tested   Puree Puree: Within functional limits Presentation: Self Fed;Spoon   Solid     Solid: Within functional limits Presentation: [name] 01/15/2019,9:23 AM  Pollyann Glen, M.A. [place] Acute Environmental education officer [phone] Office [phone]

## 2019-01-15 NOTE — Progress Notes (Signed)
Patient ID: Carl Carey, male   DOB: 1980/08/03, 38 y.o.   MRN: [id number]  PROGRESS NOTE    Samir Ishaq  TKW:[id number] DOB: [birth year] DOA: 01/13/2019 PCP: Patient, No Pcp Per   Brief Narrative:  38 year old male with history of bipolar disorder presented on 01/13/2019 with shortness of breath and respiratory distress along with altered mental status.  He was apparently found by family, lethargic but vomiting.  Patient had recent admission to [facility] for gastroenteritis and hyponatremia and reportedly had enteritis on CT scan at that time with negative stool culture/stool for C. difficile and GI PCR.  On presentation, he was found to have elevated lactic with leukocytosis.  He was started on IV fluids and broad-spectrum antibiotics for possible pneumonia.  Assessment & Plan:   Sepsis: Present on admission Community-acquired bacterial pneumonia Leukocytosis Acute hypoxic respiratory failure: Requiring BiPAP initially -Patient was started on Rocephin and Zithromax.  Currently only on Zithromax.  Will restart Rocephin. -Cultures negative so far.  Urine streptococcal antigen negative.  Respiratory panel PCR negative.  COVID-19 test negative. -Follow urine Legionella antigen. -Still spiking temperatures.  Procalcitonin 3.34 today. -Currently on 2 L oxygen via nasal cannula. -Patient had a recent CAT scan of his chest abdomen pelvis on 01/04/2019 at OSH which only showed some nonspecific enteritis.  History of bipolar disorder -Patient is on multiple medications. will restart his home meds.  Will request psychiatry to evaluate and adjust medications if needed.   Abdominal pain/diarrhea -Questionable cause.  Negative work-up so far including stool for C. difficile/GI PCR and cultures.  Had recent CT which has shown nonspecific enteritis -Might need outpatient GI eval  Hypokalemia -Replace.  Repeat a.m. labs  Hypophosphatemia -Replace  Hypoalbuminemia -Nutrition consult   Microcytic anemia -Questionable cause.  Monitor.  No signs of bleeding.   DVT prophylaxis: Heparin Code Status: Full Family Communication: Spoke to mom/Carol on phone on 01/15/19 Disposition Plan: Home in 1 to 2 days if clinically improves  Consultants: PCCM.  Requesting psychiatric evaluation  Procedures: None  Antimicrobials:  Anti-infectives (From admission, onward)   Start     Dose/Rate Route Frequency Ordered Stop   01/15/19 0800  cefTRIAXone (ROCEPHIN) 1 g in sodium chloride 0.9 % 100 mL IVPB     1 g 200 mL/hr over 30 Minutes Intravenous Daily 01/15/19 0714     01/14/19 1400  azithromycin (ZITHROMAX) 500 mg in sodium chloride 0.9 % 250 mL IVPB     500 mg 250 mL/hr over 60 Minutes Intravenous Every 24 hours 01/13/19 2330     01/14/19 1000  cefTRIAXone (ROCEPHIN) 1 g in sodium chloride 0.9 % 100 mL IVPB  Status:  Discontinued     1 g 200 mL/hr over 30 Minutes Intravenous Every 24 hours 01/13/19 2330 01/14/19 0918   01/13/19 1515  cefTRIAXone (ROCEPHIN) 1 g in sodium chloride 0.9 % 100 mL IVPB     1 g 200 mL/hr over 30 Minutes Intravenous  Once 01/13/19 1502 01/13/19 1613   01/13/19 1515  azithromycin (ZITHROMAX) 500 mg in sodium chloride 0.9 % 250 mL IVPB     500 mg 250 mL/hr over 60 Minutes Intravenous  Once 01/13/19 1502 01/13/19 1655       Subjective: Patient seen and examined at bedside.  He is a very poor historian.  Looks up on calling his name, answers some questions, intermittently stutters.  Nursing staff reports overnight fevers.  No overnight vomiting or worsening abdominal pain.  Objective: Vitals:   01/14/19 2234 01/14/19  2304 01/14/19 2311 01/15/19 0530  BP:  135/75  113/62  Pulse:  (!) 123  (!) 120  Resp:  14 14 14   Temp: (!) 101 F (38.3 C) (!) 100.9 F (38.3 C)  (!) 100.7 F (38.2 C)  TempSrc: Oral Oral    SpO2:  98%  97%  Weight:      Height:        Intake/Output Summary (Last 24 hours) at 01/15/2019 0904 Last data filed at 01/15/2019 0600  Gross per 24 hour  Intake 5365.59 ml  Output 3755 ml  Net 1610.59 ml   Filed Weights   01/13/19 1400  Weight: 65 kg    Examination:  General exam: Appears calm and comfortable.  Looks older than stated age.  Poor historian. Respiratory system: Bilateral decreased breath sounds at bases with some scattered crackles Cardiovascular system: S1 & S2 heard, tachycardic  gastrointestinal system: Abdomen is nondistended, soft and nontender. Normal bowel sounds heard. Extremities: No cyanosis, clubbing, edema  Central nervous system: Alert and oriented but is a poor historian. No focal neurological deficits. Moving extremities Skin: No rashes, lesions or ulcers Psychiatry: Could not be assessed because of mental status.     Data Reviewed: I have personally reviewed following labs and imaging studies  CBC: Recent Labs  Lab 01/13/19 1509 01/13/19 1518 01/13/19 1816 01/14/19 0511 01/15/19 0358  WBC 21.4*  --   --  46.8* 24.2*  NEUTROABS 17.9*  --   --   --  23.0*  HGB 13.9 16.0 13.6 12.6* 10.2*  HCT 44.6 47.0 40.0 38.8* 30.5*  MCV 84.5  --   --  80.5 79.6*  PLT 667*  --   --  440* 086   Basic Metabolic Panel: Recent Labs  Lab 01/13/19 1509 01/13/19 1518 01/13/19 1816 01/14/19 0511 01/15/19 0358  NA 130* 130* 135 137 135  K 3.5 4.8 3.0* 3.2* 3.2*  CL 96*  --   --  109 106  CO2 10*  --   --  21* 22  GLUCOSE 401*  --   --  110* 137*  BUN 5*  --   --  <5* <5*  CREATININE 1.59*  --   --  0.86 0.70  CALCIUM 8.8*  --   --  7.7* 7.2*  MG  --   --   --  1.6* 1.7  PHOS  --   --   --  2.9 1.4*   GFR: Estimated Creatinine Clearance: 117.4 mL/min (by C-G formula based on SCr of 0.7 mg/dL). Liver Function Tests: Recent Labs  Lab 01/13/19 1509 01/14/19 0511 01/15/19 0358  AST 119* 50* 53*  ALT 42 36 28  ALKPHOS 111 70 58  BILITOT 0.7 1.1 0.7  PROT 6.9 4.9* 4.5*  ALBUMIN 3.2* 2.2* 1.9*   Recent Labs  Lab 01/13/19 1721  LIPASE 50   No results for input(s): AMMONIA  in the last 168 hours. Coagulation Profile: Recent Labs  Lab 01/13/19 1509 01/14/19 0511 01/15/19 0358  INR 1.1 1.3* 1.4*   Cardiac Enzymes: No results for input(s): CKTOTAL, CKMB, CKMBINDEX, TROPONINI in the last 168 hours. BNP (last 3 results) No results for input(s): PROBNP in the last 8760 hours. HbA1C: No results for input(s): HGBA1C in the last 72 hours. CBG: Recent Labs  Lab 01/14/19 1150 01/14/19 1552 01/14/19 1945 01/15/19 0246 01/15/19 0859  GLUCAP 113* 102* 98 152* 115*   Lipid Profile: No results for input(s): CHOL, HDL, LDLCALC, TRIG, CHOLHDL, LDLDIRECT in the  last 72 hours. Thyroid Function Tests: No results for input(s): TSH, T4TOTAL, FREET4, T3FREE, THYROIDAB in the last 72 hours. Anemia Panel: No results for input(s): VITAMINB12, FOLATE, FERRITIN, TIBC, IRON, RETICCTPCT in the last 72 hours. Sepsis Labs: Recent Labs  Lab 01/13/19 1721 01/13/19 1942 01/13/19 2259 01/14/19 0511 01/15/19 0358  PROCALCITON 0.10  --   --  4.38 3.34  LATICACIDVEN 7.0* 3.9* 1.9  --  1.2    Recent Results (from the past 240 hour(s))  Culture, blood (routine x 2)     Status: None (Preliminary result)   Collection Time: 01/13/19  3:27 PM   Specimen: BLOOD RIGHT HAND  Result Value Ref Range Status   Specimen Description BLOOD RIGHT HAND  Final   Special Requests   Final    BOTTLES DRAWN AEROBIC AND ANAEROBIC Blood Culture results may not be optimal due to an inadequate volume of blood received in culture bottles   Culture   Final    NO GROWTH 1 DAY Performed at [facility], [place] [address] [place], [place] [address]    Report Status PENDING  Incomplete  SARS Coronavirus 2 [facility] order, Performed in [facility] hospital lab) Nasopharyngeal Nasopharyngeal Swab     Status: None   Collection Time: 01/13/19  3:37 PM   Specimen: Nasopharyngeal Swab  Result Value Ref Range Status   SARS Coronavirus 2 NEGATIVE NEGATIVE Final    Comment: (NOTE) If result is  NEGATIVE SARS-CoV-2 target nucleic acids are NOT DETECTED. The SARS-CoV-2 RNA is generally detectable in upper and lower  respiratory specimens during the acute phase of infection. The lowest  concentration of SARS-CoV-2 viral copies this assay can detect is 250  copies / mL. A negative result does not preclude SARS-CoV-2 infection  and should not be used as the sole basis for treatment or other  patient management decisions.  A negative result may occur with  improper specimen collection / handling, submission of specimen other  than nasopharyngeal swab, presence of viral mutation(s) within the  areas targeted by this assay, and inadequate number of viral copies  (<250 copies / mL). A negative result must be combined with clinical  observations, patient history, and epidemiological information. If result is POSITIVE SARS-CoV-2 target nucleic acids are DETECTED. The SARS-CoV-2 RNA is generally detectable in upper and lower  respiratory specimens dur ing the acute phase of infection.  Positive  results are indicative of active infection with SARS-CoV-2.  Clinical  correlation with patient history and other diagnostic information is  necessary to determine patient infection status.  Positive results do  not rule out bacterial infection or co-infection with other viruses. If result is PRESUMPTIVE POSTIVE SARS-CoV-2 nucleic acids MAY BE PRESENT.   A presumptive positive result was obtained on the submitted specimen  and confirmed on repeat testing.  While 2019 novel coronavirus  (SARS-CoV-2) nucleic acids may be present in the submitted sample  additional confirmatory testing may be necessary for epidemiological  and / or clinical management purposes  to differentiate between  SARS-CoV-2 and other Sarbecovirus currently known to infect humans.  If clinically indicated additional testing with an alternate test  methodology [phone]) is advised. The SARS-CoV-2 RNA is generally  detectable  in upper and lower respiratory sp ecimens during the acute  phase of infection. The expected result is Negative. Fact Sheet for Patients:  StrictlyIdeas.no Fact Sheet for Healthcare Providers: BankingDealers.co.za This test is not yet approved or cleared by the [place] FDA and has been  authorized for detection and/or diagnosis of SARS-CoV-2 by FDA under an Emergency Use Authorization (EUA).  This EUA will remain in effect (meaning this test can be used) for the duration of the COVID-19 declaration under Section 564(b)(1) of the Act, 21 U.S.C. section 360bbb-3(b)(1), unless the authorization is terminated or revoked sooner. Performed at [facility], [place] [address] [place], [place] [address]   Urine culture     Status: None (Preliminary result)   Collection Time: 01/13/19  4:56 PM   Specimen: In/Out Cath Urine  Result Value Ref Range Status   Specimen Description IN/OUT CATH URINE  Final   Special Requests NONE  Final   Culture   Final    CULTURE REINCUBATED FOR BETTER GROWTH Performed at [facility], [place] [address] [place], [place] [address]    Report Status PENDING  Incomplete  C difficile quick scan w PCR reflex     Status: None   Collection Time: 01/13/19  5:35 PM   Specimen: STOOL  Result Value Ref Range Status   C Diff antigen NEGATIVE NEGATIVE Final   C Diff toxin NEGATIVE NEGATIVE Final   C Diff interpretation No C. difficile detected.  Final    Comment: Performed at [facility], [place] [address], [place], [place] [address]  Gastrointestinal Panel by PCR , Stool     Status: None   Collection Time: 01/13/19  5:36 PM   Specimen: STOOL  Result Value Ref Range Status   Campylobacter species NOT DETECTED NOT DETECTED Final   Plesimonas shigelloides NOT DETECTED NOT DETECTED Final   Salmonella species NOT DETECTED NOT DETECTED Final   Yersinia enterocolitica NOT DETECTED NOT DETECTED Final   Vibrio  species NOT DETECTED NOT DETECTED Final   Vibrio cholerae NOT DETECTED NOT DETECTED Final   Enteroaggregative E coli (EAEC) NOT DETECTED NOT DETECTED Final   Enteropathogenic E coli (EPEC) NOT DETECTED NOT DETECTED Final   Enterotoxigenic E coli (ETEC) NOT DETECTED NOT DETECTED Final   Shiga like toxin producing E coli (STEC) NOT DETECTED NOT DETECTED Final   Shigella/Enteroinvasive E coli (EIEC) NOT DETECTED NOT DETECTED Final   Cryptosporidium NOT DETECTED NOT DETECTED Final   Cyclospora cayetanensis NOT DETECTED NOT DETECTED Final   Entamoeba histolytica NOT DETECTED NOT DETECTED Final   Giardia lamblia NOT DETECTED NOT DETECTED Final   Adenovirus F40/41 NOT DETECTED NOT DETECTED Final   Astrovirus NOT DETECTED NOT DETECTED Final   Norovirus GI/GII NOT DETECTED NOT DETECTED Final   Rotavirus A NOT DETECTED NOT DETECTED Final   Sapovirus (I, II, IV, and V) NOT DETECTED NOT DETECTED Final    Comment: Performed at [facility], [place] [place], [place] [address]  Culture, blood (routine x 2)     Status: None (Preliminary result)   Collection Time: 01/13/19  7:30 PM   Specimen: BLOOD RIGHT ARM  Result Value Ref Range Status   Specimen Description BLOOD RIGHT ARM  Final   Special Requests   Final    BOTTLES DRAWN AEROBIC AND ANAEROBIC Blood Culture results may not be optimal due to an inadequate volume of blood received in culture bottles   Culture   Final    NO GROWTH < 24 HOURS Performed at [facility], [address], [place],  [address]    Report Status PENDING  Incomplete  MRSA PCR Screening     Status: None   Collection Time: 01/14/19  2:01 AM   Specimen:  Nasal Mucosa; Nasopharyngeal  Result Value Ref Range Status   MRSA by PCR NEGATIVE NEGATIVE Final    Comment:        The GeneXpert MRSA Assay (FDA approved for NASAL specimens only), is one component of a comprehensive MRSA colonization surveillance program. It is not intended to diagnose  MRSA infection nor to guide or monitor treatment for MRSA infections. Performed at [facility], [place] [address], [place], [place] [address]   Respiratory Panel by PCR     Status: None   Collection Time: 01/14/19  9:14 AM   Specimen: Nasopharyngeal Swab; Respiratory  Result Value Ref Range Status   Adenovirus NOT DETECTED NOT DETECTED Final   Coronavirus 229E NOT DETECTED NOT DETECTED Final    Comment: (NOTE) The Coronavirus on the Respiratory Panel, DOES NOT test for the novel  Coronavirus (2019 nCoV)    Coronavirus HKU1 NOT DETECTED NOT DETECTED Final   Coronavirus NL63 NOT DETECTED NOT DETECTED Final   Coronavirus OC43 NOT DETECTED NOT DETECTED Final   Metapneumovirus NOT DETECTED NOT DETECTED Final   Rhinovirus / Enterovirus NOT DETECTED NOT DETECTED Final   Influenza A NOT DETECTED NOT DETECTED Final   Influenza B NOT DETECTED NOT DETECTED Final   Parainfluenza Virus 1 NOT DETECTED NOT DETECTED Final   Parainfluenza Virus 2 NOT DETECTED NOT DETECTED Final   Parainfluenza Virus 3 NOT DETECTED NOT DETECTED Final   Parainfluenza Virus 4 NOT DETECTED NOT DETECTED Final   Respiratory Syncytial Virus NOT DETECTED NOT DETECTED Final   Bordetella pertussis NOT DETECTED NOT DETECTED Final   Chlamydophila pneumoniae NOT DETECTED NOT DETECTED Final   Mycoplasma pneumoniae NOT DETECTED NOT DETECTED Final    Comment: Performed at [facility], [place], [place], [place] [address]         Radiology Studies: Dg Chest Port 1 View  Result Date: 01/15/2019 CLINICAL DATA:  Acute respiratory failure with hypoxia. EXAM: PORTABLE CHEST 1 VIEW COMPARISON:  Radiographs of January 13, 2019. FINDINGS: The heart size and mediastinal contours are within normal limits. No pneumothorax or pleural effusion is noted. Left lung is clear. Stable right lower lobe airspace opacity is noted with increased right upper lobe airspace opacity. The visualized skeletal structures are  unremarkable. IMPRESSION: Increased right upper lobe airspace opacity is noted concerning for worsening pneumonia. Stable right lower lobe opacity is noted. Electronically Signed   By: Marijo Conception M.D.   On: 01/15/2019 08:20   Dg Chest Portable 1 View  Result Date: 01/13/2019 CLINICAL DATA:  Acute shortness of breath.  Vomiting. EXAM: PORTABLE CHEST 1 VIEW COMPARISON:  12/10/2017 FINDINGS: Diffuse airspace opacities within the mid and lower RIGHT lung noted. Equivocal medial LEFT basilar opacity noted. The cardiomediastinal silhouette is unremarkable. No pleural effusion, pneumothorax or acute bony abnormality. IMPRESSION: Diffuse airspace opacities within the mid and lower RIGHT lung and possibly within the LEFT lung base, suspicious for pneumonia/infection or possibly aspiration. Electronically Signed   By: Margarette Canada M.D.   On: 01/13/2019 15:12        Scheduled Meds: . Chlorhexidine Gluconate Cloth  6 each Topical Daily  . heparin  5,000 Units Subcutaneous Q8H  . insulin aspart  1-3 Units Subcutaneous Q4H  . potassium chloride  40 mEq Oral Q4H  . sodium chloride flush  3 mL Intravenous Q12H  . thiamine injection  100 mg Intravenous Daily   Continuous Infusions: . azithromycin 500 mg (01/14/19 1358)  . cefTRIAXone (ROCEPHIN)  IV    . dextrose 5% lactated ringers 75 mL/hr at 01/15/19 0600     LOS: 2 days        Aline August, MD [facility] 01/15/2019, 9:04 AM

## 2019-01-15 NOTE — Progress Notes (Signed)
Echocardiogram 2D Echocardiogram has been performed.  Carl Carey 01/15/2019, 2:51 PM

## 2019-01-15 NOTE — Progress Notes (Signed)
eLink Physician-Brief Progress Note Patient Name: Carl Carey DOB: Jul 24, 1980 MRN: [id number]   Date of Service  01/15/2019  HPI/Events of Note  Multiple issues: 1. Sinus Tachycardia - HR in 130's. (Sinus Tachycardia likely related to fever) and 2. Patient requests home Trazodone for sleep.   eICU Interventions  Will order: 1. Bolus with 0.9 NaCl 1 liter IV over 1 hour now. 2. Give already ordered Tylenol for fever.  3. Trazodone 150 mg PO X 1 for sleep.     Intervention Category Major Interventions: Arrhythmia - evaluation and management  Sommer,Steven Eugene 01/15/2019, 2:12 AM

## 2019-01-15 NOTE — Progress Notes (Signed)
Right hand on patient is very swollen.  Looks like IV must of infiltrated during NS bolus.  Will take IV out and use IV on Left Forearm.

## 2019-01-15 NOTE — Progress Notes (Signed)
Pt. Still running temps and still tachycardic.  He says he can not sleep and is asking for trazadone.  Called [facility] to give them an update on pt status.  MD is ordering Trazadone and another liter of fluid.

## 2019-01-15 NOTE — Consult Note (Signed)
Telepsych Consultation   Reason for Consult:  "Bipolar disorder/on multiple meds/?meds adjustment" Referring Physician:  Dr. Aline August Location of Patient: [facility] Location of Provider: [facility]  Patient Identification: Carl Carey MRN:  [id number] Principal Diagnosis: Bipolar disorder, in partial remission, most recent episode manic ([place]) Diagnosis:  Active Problems:   Septic shock ([place])   Total Time spent with patient: 1 hour  Subjective:   Carl Carey is a 38 y.o. male patient admitted with community acquired bacterial pneumonia.  HPI:   Per chart review, patient was admitted with shortness of breath and altered mental status. He was found to have community acquired bacterial pneumonia. He was started on IV fluids and broad spectrum antibiotics.   Of note, patient was last admitted to [facility] in 11/2018 for psychosis. He was diagnosed with bipolar 1 disorder. Discharge medications included Trazodone 150 mg qhs, Haldol 15 mg qhs, Cogentin 1 mg BID and Haldol Decanoate 100 mg monthly. Collateral obtained from his mother reported that he talks fast and can be difficult to follow at baseline. He was admitted to [facility] on 9/2 from [place] for enteritis and hyponatremia. It appears that he was improved and psychiatrically cleared following medical clearance. Discharge medications included Abilify 10 mg qhs, Cogentin 1 mg daily, Buspar 10 mg TID, Celexa 20 mg qhs, Trazodone 200 mg qhs and Atarax 25 mg q 6 hours PRN.   On interview, Carl Carey reports that he is feeling better but still continues to have some shortness of breath. He reports no problems with his mental health at this time. He reports compliance with Haldol and Trazodone. He does not mention the medications he was previously discharged on from [facility] and this recent admission was discovered after speaking to the patient. He reports poor sleep. He denies SI, HI or AVH. He denies a history of  suicide attempts.   Past Psychiatric History: Bipolar 1 disorder  Risk to Self:  None. Denies SI.  Risk to Others:  None. Denies HI.  Prior Inpatient Therapy:  He was last hospitalized at [facility] at the beginning of this month.  Prior Outpatient Therapy:  [facility] and [place].   Past Medical History:  Past Medical History:  Diagnosis Date  . Bipolar 1 disorder ([place])   . Diabetes ([place])   . Malnutrition ([place])   . Panic attack   . Parkinsonism [facility])     Past Surgical History:  Procedure Laterality Date  . MULTIPLE EXTRACTIONS WITH ALVEOLOPLASTY N/A 07/29/2014   Procedure: Extraction of tooth #'s 1,15,16,17,18 with alveoloplasty and gross debridement of teeth;  Surgeon: Lenn Cal, DDS;  Location: [place];  Service: Oral Surgery;  Laterality: N/A;  . NECK SURGERY     Family History:  Family History  Problem Relation Age of [place]  . Hypertension Maternal Grandmother    Family Psychiatric  History: Denies  Social History:  Social History   Substance and Sexual Activity  Alcohol Use Yes   Comment: socially     Social History   Substance and Sexual Activity  Drug Use No    Social History   Socioeconomic History  . Marital status: Single    Spouse name: Not on file  . Number of children: Not on file  . Years of education: Not on file  . Highest education level: Not on file  Occupational History  . Not on file  Social Needs  . Financial resource strain: Not on file  .  Food insecurity    Worry: Not on file    Inability: Not on file  . Transportation needs    Medical: Not on file    Non-medical: Not on file  Tobacco Use  . Smoking status: Current Some Day Smoker    Types: Cigarettes  . Smokeless tobacco: Never Used  Substance and Sexual Activity  . Alcohol use: Yes    Comment: socially  . Drug use: No  . Sexual activity: Not on file  Lifestyle  . Physical activity    Days per week: Not on file    Minutes per session:  Not on file  . Stress: Not on file  Relationships  . Social Herbalist on phone: Not on file    Gets together: Not on file    Attends religious service: Not on file    Active member of club or organization: Not on file    Attends meetings of clubs or organizations: Not on file    Relationship status: Not on file  Other Topics Concern  . Not on file  Social History Narrative  . Not on file   Additional Social History: He lives in [place] with his mother. He denies alcohol or illicit substance use.     Allergies:  No Known Allergies  Labs:  Results for orders placed or performed during the hospital encounter of 01/13/19 (from the past 48 hour(s))  Urine rapid drug screen (hosp performed)     Status: None   Collection Time: 01/13/19  4:56 PM  Result Value Ref Range   Opiates NONE DETECTED NONE DETECTED   Cocaine NONE DETECTED NONE DETECTED   Benzodiazepines NONE DETECTED NONE DETECTED   Amphetamines NONE DETECTED NONE DETECTED   Tetrahydrocannabinol NONE DETECTED NONE DETECTED   Barbiturates NONE DETECTED NONE DETECTED    Comment: (NOTE) DRUG SCREEN FOR MEDICAL PURPOSES ONLY.  IF CONFIRMATION IS NEEDED FOR ANY PURPOSE, NOTIFY LAB WITHIN 5 DAYS. LOWEST DETECTABLE LIMITS FOR URINE DRUG SCREEN Drug Class                     Cutoff (ng/mL) Amphetamine and metabolites    1000 Barbiturate and metabolites    200 Benzodiazepine                 536 Tricyclics and metabolites     300 Opiates and metabolites        300 Cocaine and metabolites        300 THC                            50 Performed at [facility], [place] [address] [place], [place] [address]   Urinalysis, Routine w reflex microscopic     Status: Abnormal   Collection Time: 01/13/19  4:56 PM  Result Value Ref Range   Color, Urine YELLOW YELLOW   APPearance HAZY (A) CLEAR   Specific Gravity, Urine 1.014 1.005 - 1.030   pH 5.0 5.0 - 8.0   Glucose, UA >=500 (A) NEGATIVE mg/dL   Hgb urine  dipstick MODERATE (A) NEGATIVE   Bilirubin Urine NEGATIVE NEGATIVE   Ketones, ur NEGATIVE NEGATIVE mg/dL   Protein, ur 100 (A) NEGATIVE mg/dL   Nitrite NEGATIVE NEGATIVE   Leukocytes,Ua NEGATIVE NEGATIVE   RBC / HPF 0-5 0 - 5 RBC/hpf   WBC, UA 0-5 0 - 5 WBC/hpf   Bacteria, UA NONE SEEN NONE SEEN  Squamous Epithelial / LPF 0-5 0 - 5   Mucus PRESENT    Hyaline Casts, UA PRESENT     Comment: Performed at [facility], [place] [address], [place], [place] [address]  Urine culture     Status: Abnormal   Collection Time: 01/13/19  4:56 PM   Specimen: In/Out Cath Urine  Result Value Ref Range   Specimen Description IN/OUT CATH URINE    Special Requests      NONE Performed at [facility], [place] [address], [place], [place] [address]    Culture (A)     60,000 COLONIES/mL MULTIPLE SPECIES PRESENT, SUGGEST RECOLLECTION   Report Status 01/15/2019 FINAL   Ethanol     Status: None   Collection Time: 01/13/19  4:57 PM  Result Value Ref Range   Alcohol, Ethyl (B) <10 <10 mg/dL    Comment: (NOTE) Lowest detectable limit for serum alcohol is 10 mg/dL. For medical purposes only. Performed at [facility], [place] [address], [place], [place] [address]   Lactic acid, plasma     Status: Abnormal   Collection Time: 01/13/19  5:21 PM  Result Value Ref Range   Lactic Acid, Venous 7.0 (HH) 0.5 - 1.9 mmol/L    Comment: CRITICAL VALUE NOTED.  VALUE IS CONSISTENT WITH PREVIOUSLY REPORTED AND CALLED VALUE. Performed at [facility], [place] [address], [place], [place] [address]   Troponin I (High Sensitivity)     Status: Abnormal   Collection Time: 01/13/19  5:21 PM  Result Value Ref Range   Troponin I (High Sensitivity) 98 (H) <18 ng/L    Comment: (NOTE) Elevated high sensitivity troponin I (hsTnI) values and significant  changes across serial measurements may suggest ACS but many other  chronic and acute conditions are known to elevate hsTnI results.  Refer to the "Links" section  for chest pain algorithms and additional  guidance. Performed at [facility], [place] [address] [place], [place] [address]   Lipase, blood     Status: None   Collection Time: 01/13/19  5:21 PM  Result Value Ref Range   Lipase 50 11 - 51 U/L    Comment: Performed at [facility], [place] [address], [place], [place] [address]  Procalcitonin - Baseline     Status: None   Collection Time: 01/13/19  5:21 PM  Result Value Ref Range   Procalcitonin 0.10 ng/mL    Comment:        Interpretation: PCT (Procalcitonin) <= 0.5 ng/mL: Systemic infection (sepsis) is not likely. Local bacterial infection is possible. (NOTE)       Sepsis PCT Algorithm           Lower Respiratory Tract                                      Infection PCT Algorithm    ----------------------------     ----------------------------         PCT < 0.25 ng/mL                PCT < 0.10 ng/mL         Strongly encourage             Strongly discourage   discontinuation of antibiotics    initiation of antibiotics    ----------------------------     -----------------------------  PCT 0.25 - 0.50 ng/mL            PCT 0.10 - 0.25 ng/mL               OR       >80% decrease in PCT            Discourage initiation of                                            antibiotics      Encourage discontinuation           of antibiotics    ----------------------------     -----------------------------         PCT >= 0.50 ng/mL              PCT 0.26 - 0.50 ng/mL               AND        <80% decrease in PCT             Encourage initiation of                                             antibiotics       Encourage continuation           of antibiotics    ----------------------------     -----------------------------        PCT >= 0.50 ng/mL                  PCT > 0.50 ng/mL               AND         increase in PCT                  Strongly encourage                                      initiation of antibiotics    Strongly  encourage escalation           of antibiotics                                     -----------------------------                                           PCT <= 0.25 ng/mL                                                 OR                                        > 80% decrease in PCT  Discontinue / Do not initiate                                             antibiotics Performed at [facility], [place] [address], [place], [place] [address]   C difficile quick scan w PCR reflex     Status: None   Collection Time: 01/13/19  5:35 PM   Specimen: STOOL  Result Value Ref Range   C Diff antigen NEGATIVE NEGATIVE   C Diff toxin NEGATIVE NEGATIVE   C Diff interpretation No C. difficile detected.     Comment: Performed at [facility], [place] [address], [place], [place] [address]  Gastrointestinal Panel by PCR , Stool     Status: None   Collection Time: 01/13/19  5:36 PM   Specimen: STOOL  Result Value Ref Range   Campylobacter species NOT DETECTED NOT DETECTED   Plesimonas shigelloides NOT DETECTED NOT DETECTED   Salmonella species NOT DETECTED NOT DETECTED   Yersinia enterocolitica NOT DETECTED NOT DETECTED   Vibrio species NOT DETECTED NOT DETECTED   Vibrio cholerae NOT DETECTED NOT DETECTED   Enteroaggregative E coli (EAEC) NOT DETECTED NOT DETECTED   Enteropathogenic E coli (EPEC) NOT DETECTED NOT DETECTED   Enterotoxigenic E coli (ETEC) NOT DETECTED NOT DETECTED   Shiga like toxin producing E coli (STEC) NOT DETECTED NOT DETECTED   Shigella/Enteroinvasive E coli (EIEC) NOT DETECTED NOT DETECTED   Cryptosporidium NOT DETECTED NOT DETECTED   Cyclospora cayetanensis NOT DETECTED NOT DETECTED   Entamoeba histolytica NOT DETECTED NOT DETECTED   Giardia lamblia NOT DETECTED NOT DETECTED   Adenovirus F40/41 NOT DETECTED NOT DETECTED   Astrovirus NOT DETECTED NOT DETECTED   Norovirus GI/GII NOT DETECTED NOT DETECTED   Rotavirus A NOT DETECTED NOT  DETECTED   Sapovirus (I, II, IV, and V) NOT DETECTED NOT DETECTED    Comment: Performed at [facility], [address] [place], [place] [address]  I-STAT 7, (LYTES, BLD GAS, ICA, H+H)     Status: Abnormal   Collection Time: 01/13/19  6:16 PM  Result Value Ref Range   pH, Arterial 7.216 (L) 7.350 - 7.450   pCO2 arterial 34.3 32.0 - 48.0 mmHg   pO2, Arterial 88.0 83.0 - 108.0 mmHg   Bicarbonate 13.9 (L) 20.0 - 28.0 mmol/L   TCO2 15 (L) 22 - 32 mmol/L   O2 Saturation 95.0 %   Acid-base deficit 13.0 (H) 0.0 - 2.0 mmol/L   Sodium 135 135 - 145 mmol/L   Potassium 3.0 (L) 3.5 - 5.1 mmol/L   Calcium, Ion 1.17 1.15 - 1.40 mmol/L   HCT 40.0 39.0 - 52.0 %   Hemoglobin 13.6 13.0 - 17.0 g/dL   Patient temperature 98.3 F    Collection site RADIAL, ALLEN'S TEST ACCEPTABLE    Drawn by Operator    Sample type ARTERIAL   CBG monitoring, ED     Status: Abnormal   Collection Time: 01/13/19  6:42 PM  Result Value Ref Range   Glucose-Capillary 106 (H) 70 - 99 mg/dL  Glucose, capillary     Status: None   Collection Time: 01/13/19  7:27 PM  Result Value Ref Range   Glucose-Capillary 77 70 - 99 mg/dL   Comment 1 Notify RN   Culture, blood (routine x 2)     Status: None (Preliminary result)  Collection Time: 01/13/19  7:30 PM   Specimen: BLOOD RIGHT ARM  Result Value Ref Range   Specimen Description BLOOD RIGHT ARM    Special Requests      BOTTLES DRAWN AEROBIC AND ANAEROBIC Blood Culture results may not be optimal due to an inadequate volume of blood received in culture bottles   Culture      NO GROWTH 2 DAYS Performed at [facility], [place] [address] [place], [place] [address]    Report Status PENDING   HIV antibody (Routine Testing)     Status: None   Collection Time: 01/13/19  7:42 PM  Result Value Ref Range   HIV Screen 4th Generation wRfx Non Reactive Non Reactive    Comment: (NOTE) Performed At: [facility] [address] [place], [place] [id number] Rush Farmer MD [id number]   Lactic acid, plasma     Status: Abnormal   Collection Time: 01/13/19  7:42 PM  Result Value Ref Range   Lactic Acid, Venous 3.9 (HH) 0.5 - 1.9 mmol/L    Comment: CRITICAL VALUE NOTED.  VALUE IS CONSISTENT WITH PREVIOUSLY REPORTED AND CALLED VALUE. Performed at [facility], [place] [address], [place], [place] [address]   Lactic acid, plasma     Status: None   Collection Time: 01/13/19 10:59 PM  Result Value Ref Range   Lactic Acid, Venous 1.9 0.5 - 1.9 mmol/L    Comment: Performed at [place] [address], [place], [place] [address]  Glucose, capillary     Status: Abnormal   Collection Time: 01/13/19 11:45 PM  Result Value Ref Range   Glucose-Capillary 113 (H) 70 - 99 mg/dL  MRSA PCR Screening     Status: None   Collection Time: 01/14/19  2:01 AM   Specimen: Nasal Mucosa; Nasopharyngeal  Result Value Ref Range   MRSA by PCR NEGATIVE NEGATIVE    Comment:        The GeneXpert MRSA Assay (FDA approved for NASAL specimens only), is one component of a comprehensive MRSA colonization surveillance program. It is not intended to diagnose MRSA infection nor to guide or monitor treatment for MRSA infections. Performed at [facility], [place] [address], [place], [place] [address]   Glucose, capillary     Status: Abnormal   Collection Time: 01/14/19  3:25 AM  Result Value Ref Range   Glucose-Capillary 113 (H) 70 - 99 mg/dL  Protime-INR     Status: Abnormal   Collection Time: 01/14/19  5:11 AM  Result Value Ref Range   Prothrombin Time 15.7 (H) 11.4 - 15.2 seconds   INR 1.3 (H) 0.8 - 1.2    Comment: (NOTE) INR goal varies based on device and disease states. Performed at [facility], [place] [address] [place], [place] [address]   Comprehensive metabolic panel     Status: Abnormal   Collection Time: 01/14/19  5:11 AM  Result Value Ref Range   Sodium 137 135 - 145 mmol/L   Potassium 3.2 (L) 3.5 - 5.1 mmol/L   Chloride 109 98 -  111 mmol/L   CO2 21 (L) 22 - 32 mmol/L   Glucose, Bld 110 (H) 70 - 99 mg/dL   BUN <5 (L) 6 - 20 mg/dL   Creatinine, Ser 0.86 0.61 - 1.24 mg/dL   Calcium 7.7 (L) 8.9 - 10.3 mg/dL   Total Protein 4.9 (L) 6.5 - 8.1 g/dL   Albumin 2.2 (L) 3.5 - 5.0 g/dL  AST 50 (H) 15 - 41 U/L   ALT 36 0 - 44 U/L   Alkaline Phosphatase 70 38 - 126 U/L   Total Bilirubin 1.1 0.3 - 1.2 mg/dL   GFR calc non Af Amer >60 >60 mL/min   GFR calc Af Amer >60 >60 mL/min   Anion gap 7 5 - 15    Comment: Performed at [place] [address] [place], [place] [address]  CBC     Status: Abnormal   Collection Time: 01/14/19  5:11 AM  Result Value Ref Range   WBC 46.8 (H) 4.0 - 10.5 K/uL   RBC 4.82 4.22 - 5.81 MIL/uL   Hemoglobin 12.6 (L) 13.0 - 17.0 g/dL   HCT 38.8 (L) 39.0 - 52.0 %   MCV 80.5 80.0 - 100.0 fL   MCH 26.1 26.0 - 34.0 pg   MCHC 32.5 30.0 - 36.0 g/dL   RDW 13.2 11.5 - 15.5 %   Platelets 440 (H) 150 - 400 K/uL   nRBC 0.0 0.0 - 0.2 %    Comment: Performed at [facility], [place] [address] [place], [place] [address]  Procalcitonin     Status: None   Collection Time: 01/14/19  5:11 AM  Result Value Ref Range   Procalcitonin 4.38 ng/mL    Comment:        Interpretation: PCT > 2 ng/mL: Systemic infection (sepsis) is likely, unless other causes are known. (NOTE)       Sepsis PCT Algorithm           Lower Respiratory Tract                                      Infection PCT Algorithm    ----------------------------     ----------------------------         PCT < 0.25 ng/mL                PCT < 0.10 ng/mL         Strongly encourage             Strongly discourage   discontinuation of antibiotics    initiation of antibiotics    ----------------------------     -----------------------------       PCT 0.25 - 0.50 ng/mL            PCT 0.10 - 0.25 ng/mL               OR       >80% decrease in PCT            Discourage initiation of                                            antibiotics       Encourage discontinuation           of antibiotics    ----------------------------     -----------------------------         PCT >= 0.50 ng/mL              PCT 0.26 - 0.50 ng/mL               AND       <80% decrease in PCT  Encourage initiation of                                             antibiotics       Encourage continuation           of antibiotics    ----------------------------     -----------------------------        PCT >= 0.50 ng/mL                  PCT > 0.50 ng/mL               AND         increase in PCT                  Strongly encourage                                      initiation of antibiotics    Strongly encourage escalation           of antibiotics                                     -----------------------------                                           PCT <= 0.25 ng/mL                                                 OR                                        > 80% decrease in PCT                                     Discontinue / Do not initiate                                             antibiotics Performed at [facility], [address] [place], [place] [address]   Magnesium     Status: Abnormal   Collection Time: 01/14/19  5:11 AM  Result Value Ref Range   Magnesium 1.6 (L) 1.7 - 2.4 mg/dL    Comment: Performed at [place] [address] [place], [place] [address]  Phosphorus     Status: None   Collection Time: 01/14/19  5:11 AM  Result Value Ref Range   Phosphorus 2.9 2.5 - 4.6 mg/dL    Comment: Performed at [place] [address] [place], [place] [address]  Glucose, capillary     Status: None   Collection Time: 01/14/19  7:54 AM  Result  Value Ref Range   Glucose-Capillary 98 70 - 99 mg/dL  Strep pneumoniae urinary antigen     Status: None   Collection Time: 01/14/19  8:47 AM  Result Value Ref Range   Strep Pneumo Urinary Antigen NEGATIVE NEGATIVE    Comment:        Infection due to S.  pneumoniae cannot be absolutely ruled out since the antigen present may be below the detection limit of the test. Performed at [facility], [place] [address], [place], [place] [address]   Respiratory Panel by PCR     Status: None   Collection Time: 01/14/19  9:14 AM   Specimen: Nasopharyngeal Swab; Respiratory  Result Value Ref Range   Adenovirus NOT DETECTED NOT DETECTED   Coronavirus 229E NOT DETECTED NOT DETECTED    Comment: (NOTE) The Coronavirus on the Respiratory Panel, DOES NOT test for the novel  Coronavirus (2019 nCoV)    Coronavirus HKU1 NOT DETECTED NOT DETECTED   Coronavirus NL63 NOT DETECTED NOT DETECTED   Coronavirus OC43 NOT DETECTED NOT DETECTED   Metapneumovirus NOT DETECTED NOT DETECTED   Rhinovirus / Enterovirus NOT DETECTED NOT DETECTED   Influenza A NOT DETECTED NOT DETECTED   Influenza B NOT DETECTED NOT DETECTED   Parainfluenza Virus 1 NOT DETECTED NOT DETECTED   Parainfluenza Virus 2 NOT DETECTED NOT DETECTED   Parainfluenza Virus 3 NOT DETECTED NOT DETECTED   Parainfluenza Virus 4 NOT DETECTED NOT DETECTED   Respiratory Syncytial Virus NOT DETECTED NOT DETECTED   Bordetella pertussis NOT DETECTED NOT DETECTED   Chlamydophila pneumoniae NOT DETECTED NOT DETECTED   Mycoplasma pneumoniae NOT DETECTED NOT DETECTED    Comment: Performed at [facility], [place] [address], [place], [place] [address]  Glucose, capillary     Status: Abnormal   Collection Time: 01/14/19 11:50 AM  Result Value Ref Range   Glucose-Capillary 113 (H) 70 - 99 mg/dL  Glucose, capillary     Status: Abnormal   Collection Time: 01/14/19  3:52 PM  Result Value Ref Range   Glucose-Capillary 102 (H) 70 - 99 mg/dL  Glucose, capillary     Status: None   Collection Time: 01/14/19  7:45 PM  Result Value Ref Range   Glucose-Capillary 98 70 - 99 mg/dL  Culture, blood (Routine X 2) w Reflex to ID Panel     Status: None (Preliminary result)   Collection Time: 01/14/19  9:23 PM    Specimen: BLOOD  Result Value Ref Range   Specimen Description BLOOD RIGHT ARM    Special Requests      BOTTLES DRAWN AEROBIC AND ANAEROBIC Blood Culture adequate volume   Culture      NO GROWTH < 24 HOURS Performed at [facility], [address] [place], [place] [address]    Report Status PENDING   Culture, blood (Routine X 2) w Reflex to ID Panel     Status: None (Preliminary result)   Collection Time: 01/14/19  9:23 PM   Specimen: BLOOD  Result Value Ref Range   Specimen Description BLOOD LEFT HAND    Special Requests      BOTTLES DRAWN AEROBIC AND ANAEROBIC Blood Culture adequate volume   Culture      NO GROWTH < 24 HOURS Performed at [facility], [place] [address], [place], [place] [address]    Report Status PENDING   Glucose, capillary     Status: Abnormal   Collection Time: 01/15/19  2:46 AM  Result Value Ref Range   Glucose-Capillary 152 (H) 70 - 99 mg/dL   Comment 1 Notify RN    Comment 2 Document in Chart   Lactic acid, plasma     Status: None   Collection Time: 01/15/19  3:58 AM  Result Value Ref Range   Lactic Acid, Venous 1.2 0.5 - 1.9 mmol/L    Comment: Performed at [place] [address] [place], [place] [address]  BMET in AM     Status: Abnormal   Collection Time: 01/15/19  3:58 AM  Result Value Ref Range   Sodium 135 135 - 145 mmol/L   Potassium 3.2 (L) 3.5 - 5.1 mmol/L   Chloride 106 98 - 111 mmol/L   CO2 22 22 - 32 mmol/L   Glucose, Bld 137 (H) 70 - 99 mg/dL   BUN <5 (L) 6 - 20 mg/dL   Creatinine, Ser 0.70 0.61 - 1.24 mg/dL   Calcium 7.2 (L) 8.9 - 10.3 mg/dL   GFR calc non Af Amer >60 >60 mL/min   GFR calc Af Amer >60 >60 mL/min   Anion gap 7 5 - 15    Comment: Performed at [facility], [place] [address] [place], [place] [address]  CBC with Differential/Platelet     Status: Abnormal   Collection Time: 01/15/19  3:58 AM  Result Value Ref Range   WBC 24.2 (H) 4.0 - 10.5 K/uL   RBC 3.83 (L) 4.22 - 5.81 MIL/uL    Hemoglobin 10.2 (L) 13.0 - 17.0 g/dL   HCT 30.5 (L) 39.0 - 52.0 %   MCV 79.6 (L) 80.0 - 100.0 fL   MCH 26.6 26.0 - 34.0 pg   MCHC 33.4 30.0 - 36.0 g/dL   RDW 13.7 11.5 - 15.5 %   Platelets 351 150 - 400 K/uL   nRBC 0.0 0.0 - 0.2 %   Neutrophils Relative % 95 %   Neutro Abs 23.0 (H) 1.7 - 7.7 K/uL   Lymphocytes Relative 1 %   Lymphs Abs 0.3 (L) 0.7 - 4.0 K/uL   Monocytes Relative 3 %   Monocytes Absolute 0.8 0.1 - 1.0 K/uL   Eosinophils Relative 0 %   Eosinophils Absolute 0.0 0.0 - 0.5 K/uL   Basophils Relative 0 %   Basophils Absolute 0.1 0.0 - 0.1 K/uL   Immature Granulocytes 1 %   Abs Immature Granulocytes 0.13 (H) 0.00 - 0.07 K/uL    Comment: Performed at [place] [address], [place], [place] [address]  Magnesium     Status: None   Collection Time: 01/15/19  3:58 AM  Result Value Ref Range   Magnesium 1.7 1.7 - 2.4 mg/dL    Comment: Performed at [facility] [address] [place], [place] [address]  Phosphorus     Status: Abnormal   Collection Time: 01/15/19  3:58 AM  Result Value Ref Range   Phosphorus 1.4 (L) 2.5 - 4.6 mg/dL    Comment: Performed at [facility] [address] [place], [place] [address]  Hepatic function panel     Status: Abnormal   Collection Time: 01/15/19  3:58 AM  Result Value Ref Range   Total Protein 4.5 (L) 6.5 - 8.1 g/dL   Albumin 1.9 (L) 3.5 - 5.0 g/dL   AST 53 (H) 15 - 41 U/L   ALT 28 0 - 44 U/L   Alkaline Phosphatase 58 38 - 126 U/L   Total  Bilirubin 0.7 0.3 - 1.2 mg/dL   Bilirubin, Direct 0.2 0.0 - 0.2 mg/dL   Indirect Bilirubin 0.5 0.3 - 0.9 mg/dL    Comment: Performed at [facility], [place] [address], [place], [place] [address]  Protime-INR     Status: Abnormal   Collection Time: 01/15/19  3:58 AM  Result Value Ref Range   Prothrombin Time 17.2 (H) 11.4 - 15.2 seconds   INR 1.4 (H) 0.8 - 1.2    Comment: (NOTE) INR goal varies based on device and disease states. Performed at [facility], [place] [address], [place], [place] [address]   Procalcitonin     Status: None   Collection Time: 01/15/19  3:58 AM  Result Value Ref Range   Procalcitonin 3.34 ng/mL    Comment:        Interpretation: PCT > 2 ng/mL: Systemic infection (sepsis) is likely, unless other causes are known. (NOTE)       Sepsis PCT Algorithm           Lower Respiratory Tract                                      Infection PCT Algorithm    ----------------------------     ----------------------------         PCT < 0.25 ng/mL                PCT < 0.10 ng/mL         Strongly encourage             Strongly discourage   discontinuation of antibiotics    initiation of antibiotics    ----------------------------     -----------------------------       PCT 0.25 - 0.50 ng/mL            PCT 0.10 - 0.25 ng/mL               OR       >80% decrease in PCT            Discourage initiation of                                            antibiotics      Encourage discontinuation           of antibiotics    ----------------------------     -----------------------------         PCT >= 0.50 ng/mL              PCT 0.26 - 0.50 ng/mL               AND       <80% decrease in PCT              Encourage initiation of                                             antibiotics       Encourage continuation           of antibiotics    ----------------------------     -----------------------------        PCT >=  0.50 ng/mL                  PCT > 0.50 ng/mL               AND         increase in PCT                  Strongly encourage                                      initiation of antibiotics    Strongly encourage escalation           of antibiotics                                     -----------------------------                                           PCT <= 0.25 ng/mL                                                 OR                                        > 80% decrease in PCT                                     Discontinue /  Do not initiate                                             antibiotics Performed at [facility], [address] [place], [place] [address]   Glucose, capillary     Status: Abnormal   Collection Time: 01/15/19  8:59 AM  Result Value Ref Range   Glucose-Capillary 115 (H) 70 - 99 mg/dL  Glucose, capillary     Status: None   Collection Time: 01/15/19 11:56 AM  Result Value Ref Range   Glucose-Capillary 96 70 - 99 mg/dL    Medications:  Current Facility-Administered Medications  Medication Dose Route Frequency Provider Last Rate Last Dose  . 0.9 %  sodium chloride infusion   Intravenous Continuous Alekh, Kshitiz, MD 125 mL/hr at 01/15/19 1017    . acetaminophen (TYLENOL) tablet 650 mg  650 mg Oral Q6H PRN Kipp Brood, MD   650 mg at 01/15/19 1158   Or  . acetaminophen (TYLENOL) suppository 650 mg  650 mg Rectal Q6H PRN Kipp Brood, MD      . azithromycin (ZITHROMAX) 500 mg in sodium chloride 0.9 % 250 mL IVPB  500 mg Intravenous Q24H Elsie Lincoln, MD 250 mL/hr at 01/15/19 1454 500 mg at 01/15/19 1454  . benztropine (COGENTIN) tablet 1 mg  1 mg Oral BID Aline August, MD   1 mg  at 01/15/19 1158  . cefTRIAXone (ROCEPHIN) 1 g in sodium chloride 0.9 % 100 mL IVPB  1 g Intravenous Daily Starla Link, Kshitiz, MD 200 mL/hr at 01/15/19 0911 1 g at 01/15/19 0911  . Chlorhexidine Gluconate Cloth 2 % PADS 6 each  6 each Topical Daily Kipp Brood, MD   6 each at 01/15/19 0910  . heparin injection 5,000 Units  5,000 Units Subcutaneous Q8H Kipp Brood, MD   5,000 Units at 01/15/19 1454  . ibuprofen (ADVIL) tablet 400 mg  400 mg Oral Q6H PRN Aline August, MD   400 mg at 01/15/19 1305  . insulin aspart (novoLOG) injection 1-3 Units  1-3 Units Subcutaneous Q4H Kipp Brood, MD   Stopped at 01/15/19 0523  . LORazepam (ATIVAN) injection 0.5 mg  0.5 mg Intravenous Q4H PRN Kipp Brood, MD   0.5 mg at 01/15/19 1222  . ondansetron (ZOFRAN) injection 4 mg  4 mg Intravenous Q6H PRN Elsie Lincoln, MD      . phosphorus (K PHOS NEUTRAL) tablet 250 mg  250 mg Oral BID Aline August, MD   250 mg at 01/15/19 1158  . sodium chloride flush (NS) 0.9 % injection 3 mL  3 mL Intravenous Q12H Kipp Brood, MD   3 mL at 01/15/19 0911  . traZODone (DESYREL) tablet 150 mg  150 mg Oral QHS,MR X 1 Alekh, Kshitiz, MD        Musculoskeletal: Strength & Muscle Tone: No atrophy noted. Gait & Station: UTA since patient is lying in bed. Patient leans: N/A  Psychiatric Specialty Exam: Physical Exam  Nursing note and vitals reviewed. Constitutional: He is oriented to person, place, and time. He appears well-developed and well-nourished.  HENT:  Head: Normocephalic and atraumatic.  Neck: Normal range of motion.  Respiratory: Effort normal.  Musculoskeletal: Normal range of motion.  Neurological: He is alert and oriented to person, place, and time.  Psychiatric: He has a normal mood and affect. His speech is normal and behavior is normal. Judgment and thought content normal. Cognition and memory are normal.    Review of Systems  Respiratory: Positive for shortness of breath.   Psychiatric/Behavioral: Negative for depression, hallucinations, substance abuse and suicidal ideas. The patient has insomnia.   All other systems reviewed and are negative.   Blood pressure 119/61, pulse (!) 105, temperature 98.5 F (36.9 C), temperature source Oral, resp. rate 16, height 5' 8"  (1.727 m), weight 65 kg, SpO2 97 %.Body mass index is 21.79 kg/m.  General Appearance: Fairly Groomed, young, African American male, wearing a hospital gown with nasal cannula who is lying in bed. NAD.   Eye Contact:  Good  Speech:  Clear and Coherent and Normal Rate with frequent stuttering.   Volume:  Normal  Mood:  Euthymic  Affect:  Appropriate and Congruent  Thought Process:  Goal Directed, Linear and Descriptions of Associations: Intact  Orientation:  Full (Time, Place, and Person)  Thought Content:  Logical   Suicidal Thoughts:  No  Homicidal Thoughts:  No  Memory:  Immediate;   Good Recent;   Good Remote;   Good  Judgement:  Fair  Insight:  Fair  Psychomotor Activity:  Normal  Concentration:  Concentration: Good and Attention Span: Good  Recall:  Good  Fund of Knowledge:  Good  Language:  Good  Akathisia:  No  Handed:  Right  AIMS (if indicated):   N/A  Assets:  Communication Skills Desire for Improvement Housing Resilience Social Support  ADL's:  Intact  Cognition:  WNL  Sleep:   Poor   Assessment:  Kazimir Hartnett is a 38 y.o. male who was admitted with shortness of breath and altered mental status. He was found to have community acquired bacterial pneumonia. He was started on IV fluids and broad spectrum antibiotics. Patient presents with a euthymic affect. He denies SI, HI or AVH. He does not appear to be responding to internal stimuli and is organized in thought process. Recommend restarting home medications and follow up with outpatient provider.   Treatment Plan Summary: -Restart prior home medications including Abilify 10 mg qhs for mood stabilization/psychosis, Cogentin 1 mg daily, Celexa 20 mg qhs for mood and Trazodone 200 mg qhs for insomnia.  -Hold Buspar since it is unclear if patient has been compliant with his medications and taking multiple serotonergic medications.  -Continue follow up with outpatient provider for medication management.  -Psychiatry will sign off on patient at this time. Please consult psychiatry again as needed.   Disposition: No evidence of imminent risk to self or others at present.   Patient does not meet criteria for psychiatric inpatient admission.  This service was provided via telemedicine using a 2-way, interactive audio and video technology.  Names of all persons participating in this telemedicine service and their role in this encounter. Name: Buford Dresser, DO Role: Psychiatrist   Name: Carl Carey Role: Patient     Faythe Dingwall, DO 01/15/2019 3:43 PM

## 2019-01-16 ENCOUNTER — Inpatient Hospital Stay (HOSPITAL_COMMUNITY): Payer: Medicaid Other

## 2019-01-16 LAB — COMPREHENSIVE METABOLIC PANEL
ALT: 27 U/L (ref 0–44)
AST: 56 U/L — ABNORMAL HIGH (ref 15–41)
Albumin: 1.8 g/dL — ABNORMAL LOW (ref 3.5–5.0)
Alkaline Phosphatase: 60 U/L (ref 38–126)
Anion gap: 7 (ref 5–15)
BUN: 7 mg/dL (ref 6–20)
CO2: 19 mmol/L — ABNORMAL LOW (ref 22–32)
Calcium: 7.5 mg/dL — ABNORMAL LOW (ref 8.9–10.3)
Chloride: 110 mmol/L (ref 98–111)
Creatinine, Ser: 0.81 mg/dL (ref 0.61–1.24)
GFR calc Af Amer: >60 mL/min (ref >60)
GFR calc non Af Amer: >60 mL/min (ref >60)
Glucose, Bld: 131 mg/dL — ABNORMAL HIGH (ref 70–99)
Potassium: 3.8 mmol/L (ref 3.5–5.1)
Sodium: 136 mmol/L (ref 135–145)
Total Bilirubin: 0.6 mg/dL (ref 0.3–1.2)
Total Protein: 4.8 g/dL — ABNORMAL LOW (ref 6.5–8.1)

## 2019-01-16 LAB — MAGNESIUM: Magnesium: 1.7 mg/dL (ref 1.7–2.4)

## 2019-01-16 LAB — CBC WITH DIFFERENTIAL/PLATELET
Abs Immature Granulocytes: 0.08 10*3/uL — ABNORMAL HIGH (ref 0.00–0.07)
Basophils Absolute: 0 10*3/uL (ref 0.0–0.1)
Basophils Relative: 0 %
Eosinophils Absolute: 0 10*3/uL (ref 0.0–0.5)
Eosinophils Relative: 0 %
HCT: 29.8 % — ABNORMAL LOW (ref 39.0–52.0)
Hemoglobin: 10 g/dL — ABNORMAL LOW (ref 13.0–17.0)
Immature Granulocytes: 1 %
Lymphocytes Relative: 2 %
Lymphs Abs: 0.2 10*3/uL — ABNORMAL LOW (ref 0.7–4.0)
MCH: 27.1 pg (ref 26.0–34.0)
MCHC: 33.6 g/dL (ref 30.0–36.0)
MCV: 80.8 fL (ref 80.0–100.0)
Monocytes Absolute: 0.5 10*3/uL (ref 0.1–1.0)
Monocytes Relative: 4 %
Neutro Abs: 10.8 10*3/uL — ABNORMAL HIGH (ref 1.7–7.7)
Neutrophils Relative %: 93 %
Platelets: 294 10*3/uL (ref 150–400)
RBC: 3.69 MIL/uL — ABNORMAL LOW (ref 4.22–5.81)
RDW: 14 % (ref 11.5–15.5)
WBC: 11.7 10*3/uL — ABNORMAL HIGH (ref 4.0–10.5)
nRBC: 0 % (ref 0.0–0.2)

## 2019-01-16 LAB — GLUCOSE, CAPILLARY
Glucose-Capillary: 127 mg/dL — ABNORMAL HIGH (ref 70–99)
Glucose-Capillary: 181 mg/dL — ABNORMAL HIGH (ref 70–99)
Glucose-Capillary: 86 mg/dL (ref 70–99)
Glucose-Capillary: 89 mg/dL (ref 70–99)
Glucose-Capillary: 94 mg/dL (ref 70–99)
Glucose-Capillary: 95 mg/dL (ref 70–99)

## 2019-01-16 LAB — SARS CORONAVIRUS 2 (TAT 6-24 HRS): SARS Coronavirus 2: NEGATIVE

## 2019-01-16 LAB — LEGIONELLA PNEUMOPHILA SEROGP 1 UR AG: L. pneumophila Serogp 1 Ur Ag: NEGATIVE

## 2019-01-16 LAB — PHOSPHORUS: Phosphorus: 1.8 mg/dL — ABNORMAL LOW (ref 2.5–4.6)

## 2019-01-16 LAB — PROCALCITONIN: Procalcitonin: 3.3 ng/mL

## 2019-01-16 LAB — LACTIC ACID, PLASMA: Lactic Acid, Venous: 0.9 mmol/L (ref 0.5–1.9)

## 2019-01-16 MED ORDER — IOHEXOL 300 MG/ML  SOLN
100.0000 mL | Freq: Once | INTRAMUSCULAR | Status: AC | PRN
  Administered 2019-01-16: 100 mL via INTRAVENOUS

## 2019-01-16 NOTE — Progress Notes (Signed)
MEWS 6. Temp 102.1. HR 113. Respirations 27.  Tylenol given. Ibuprofen given. Bolus ordered. Reviewed chart. Pt has been pan cultured. Spikes fevers occasionally. MEWS up due to temp, RR, and HR. Lactate 0.9. CBC with much improved WBCC. On abx. Continue MEWS protocol.  KJKG, NP [facility]

## 2019-01-16 NOTE — Evaluation (Signed)
Physical Therapy Evaluation Patient Details Name: Carl Carey MRN: [id number] DOB: 1980-08-19 Today's Date: 01/16/2019   History of Present Illness  38 year old male with history of bipolar disorder presented on 01/13/2019 with shortness of breath and respiratory distress along with altered mental status.  He was apparently found by family, lethargic but vomiting.  Patient had recent admission to [facility] for gastroenteritis and hyponatremia.  Admitted for CAP.  Clinical Impression  Patient presents with decreased mobility due to deficits listed in PT problem list.  Able to rise from bed with S, but minguard needed for safety with ambulation and pt preferring to use RW today.  Feel he will benefit from skilled PT in the acute setting prior to d/c home with family (though pt reporting possibly not a clean/safe environment, may need SW consult.)    Follow Up Recommendations Supervision - Intermittent;No PT follow up    Equipment Recommendations  None recommended by PT    Recommendations for Other Services Other (comment)(social worker consult)     Precautions / Restrictions Precautions Precautions: Fall      Mobility  Bed Mobility Overal bed mobility: Modified Independent                Transfers Overall transfer level: Needs assistance Equipment used: Rolling walker (2 wheeled) Transfers: Sit to/from Stand Sit to Stand: Supervision         General transfer comment: patient requesting to use the walker, stable and able to rise unsupported  Ambulation/Gait Ambulation/Gait assistance: Min guard Gait Distance (Feet): 400 Feet Assistive device: Rolling walker (2 wheeled) Gait Pattern/deviations: Step-through pattern;Decreased stride length     General Gait Details: assist to maneuver walker around obstacles in the hallway, able to walk in room no device with wide BOS  Stairs            Wheelchair Mobility    Modified Rankin (Stroke Patients Only)        Balance Overall balance assessment: Needs assistance   Sitting balance-Leahy Scale: Good     Standing balance support: Single extremity supported;No upper extremity supported Standing balance-Leahy Scale: Fair Standing balance comment: prefers UE support for ambulation right now                             Pertinent Vitals/Pain Pain Assessment: Faces Faces Pain Scale: Hurts little more Pain Location: aching in his legs Pain Descriptors / Indicators: Grimacing;Aching Pain Intervention(s): Monitored during session;Repositioned    Home Living Family/patient expects to be discharged to:: Private residence Living Arrangements: Parent Available Help at Discharge: Family Type of Home: House Home Access: Stairs to enter   Technical brewer of Steps: 2 Home Layout: One level   Additional Comments: reports he sleeps on the couch and that there is not a shower for him to use, washes up in the sink; also reports there is a lot of dirty bags around in the house.    Prior Function Level of Independence: Independent               Hand Dominance        Extremity/Trunk Assessment   Upper Extremity Assessment Upper Extremity Assessment: Overall WFL for tasks assessed    Lower Extremity Assessment Lower Extremity Assessment: Generalized weakness       Communication   Communication: Expressive difficulties(stutters, rushes)  Cognition Arousal/Alertness: Awake/alert Behavior During Therapy: WFL for tasks assessed/performed Overall Cognitive Status: No family/caregiver present to determine baseline cognitive functioning  General Comments: alert and oriented, some, likely baseline, cognitive challenges, possibly poor education      General Comments General comments (skin integrity, edema, etc.): reports some tremors at times (discussed possibly medication related), also noted R side facial numbness and  noted did not puff out cheek on that side with valsalva, other cranial nerve testing [facility]    Exercises     Assessment/Plan    PT Assessment Patient needs continued PT services  PT Problem List Decreased balance;Decreased activity tolerance;Decreased mobility;Decreased knowledge of use of DME;Decreased safety awareness;Decreased strength       PT Treatment Interventions DME instruction;Stair training;Therapeutic activities;Balance training;Therapeutic exercise;Functional mobility training;Gait training;Patient/family education    PT Goals (Current goals can be found in the Care Plan section)  Acute Rehab PT Goals Patient Stated Goal: to get SW to see him to help with housing PT Goal Formulation: With patient Time For Goal Achievement: 01/30/19 Potential to Achieve Goals: Good    Frequency Min 3X/week   Barriers to discharge        Co-evaluation               AM-PAC PT "6 Clicks" Mobility  Outcome Measure Help needed turning from your back to your side while in a flat bed without using bedrails?: None Help needed moving from lying on your back to sitting on the side of a flat bed without using bedrails?: None Help needed moving to and from a bed to a chair (including a wheelchair)?: A Little Help needed standing up from a chair using your arms (e.g., wheelchair or bedside chair)?: A Little Help needed to walk in hospital room?: A Little Help needed climbing 3-5 steps with a railing? : A Little 6 Click Score: 20    End of Session   Activity Tolerance: Patient tolerated treatment well Patient left: in bed;with call bell/phone within reach Nurse Communication: Other (comment)(need for OOB and linen change when back from CT) PT Visit Diagnosis: Other abnormalities of gait and mobility (R26.89);Other symptoms and signs involving the nervous system (R29.898)    Time: 9767-3419 PT Time Calculation (min) (ACUTE ONLY): 35 min   Charges:   PT Evaluation $PT Eval Moderate  Complexity: 1 Mod PT Treatments $Gait Training: 8-22 mins        Magda Kiel, [place] [facility] [phone] 01/16/2019   Reginia Naas 01/16/2019, 1:49 PM

## 2019-01-16 NOTE — Plan of Care (Signed)
  Problem: Clinical Measurements: Goal: Ability to maintain clinical measurements within normal limits will improve Outcome: Progressing Goal: Will remain free from infection Outcome: Progressing Goal: Diagnostic test results will improve Outcome: Progressing Goal: Respiratory complications will improve Outcome: Progressing Goal: Cardiovascular complication will be avoided Outcome: Progressing   Problem: Nutrition: Goal: Adequate nutrition will be maintained Outcome: Progressing   Problem: Safety: Goal: Ability to remain free from injury will improve Outcome: Progressing   Problem: Pain Managment: Goal: General experience of comfort will improve Outcome: Progressing

## 2019-01-16 NOTE — Progress Notes (Signed)
Patient ID: Carl Carey, male   DOB: [birth year], 38 y.o.   MRN: [id number]  PROGRESS NOTE    Carl Carey  ION:[id number] DOB: 21-Dec-1980 DOA: 01/13/2019 PCP: Patient, No Pcp Per   Brief Narrative:  38 year old male with history of bipolar disorder presented on 01/13/2019 with shortness of breath and respiratory distress along with altered mental status.  He was apparently found by family, lethargic but vomiting.  Patient had recent admission to [facility] for gastroenteritis and hyponatremia and reportedly had enteritis on CT scan at that time with negative stool culture/stool for C. difficile and GI PCR.  On presentation, he was found to have elevated lactic with leukocytosis.  He was started on IV fluids and broad-spectrum antibiotics for possible pneumonia.  Assessment & Plan:   Sepsis: Present on admission Community-acquired bacterial pneumonia Leukocytosis Acute hypoxic respiratory failure: Requiring BiPAP initially -Currently on Rocephin and Zithromax. -Blood cultures negative so far.  Urine cultures growing 60,000 colonies of multiple species. urine streptococcal antigen negative.  Respiratory panel PCR negative.  COVID-19 test negative. -Follow urine Legionella antigen. -Still spiking temperatures.  Procalcitonin still at 3.3 today.  -Currently on 2 L oxygen via nasal cannula. -Patient had a recent CAT scan of his chest abdomen pelvis on 01/04/2019 at OSH which only showed some nonspecific enteritis. -2D echo showed EF of 50 to 55% with no valvular vegetation visualized. -Leukocytosis is much improved. -We will get CT of the chest abdomen and pelvis.  History of bipolar disorder -Patient is on multiple medications.  Medications have been restarted as per psychiatry recommendations.  Outpatient follow-up with psychiatry.  Abdominal pain/diarrhea -Questionable cause.  Negative work-up so far including stool for C. difficile/GI PCR and cultures.  Had recent CT which has shown  nonspecific enteritis -Might need outpatient GI eval  Hypokalemia -Improved.  Hypophosphatemia -Replace  Hypoalbuminemia -Nutrition consult  Microcytic anemia -Questionable cause.  Monitor.  No signs of bleeding.   DVT prophylaxis: Heparin Code Status: Full Family Communication: Spoke to mom/Carol on phone on 01/16/19 Disposition Plan: Home in 1 to 2 days if clinically improves  Consultants: PCCM.  Requesting psychiatric evaluation  Procedures:  Echo IMPRESSIONS    1. The left ventricle has low normal systolic function, with an ejection fraction of 50-55%. The cavity size was normal. Left ventricular diastolic parameters were normal. No evidence of left ventricular regional wall motion abnormalities.  2. The aortic root is normal in size and structure.  3. The aortic valve is tricuspid. No stenosis of the aortic valve.  4. No evidence of mitral valve stenosis. No significant mitral regurgitation.  5. The right ventricle has normal systolic function. The cavity was normal. There is no increase in right ventricular wall thickness.  6. Normal IVC size. No complete TR doppler jet so unable to estimate PA systolic pressure.  7. No valvular vegetation was visualized.  Antimicrobials:  Anti-infectives (From admission, onward)   Start     Dose/Rate Route Frequency Ordered Stop   01/15/19 0800  cefTRIAXone (ROCEPHIN) 1 g in sodium chloride 0.9 % 100 mL IVPB     1 g 200 mL/hr over 30 Minutes Intravenous Daily 01/15/19 0714     01/14/19 1400  azithromycin (ZITHROMAX) 500 mg in sodium chloride 0.9 % 250 mL IVPB     500 mg 250 mL/hr over 60 Minutes Intravenous Every 24 hours 01/13/19 2330     01/14/19 1000  cefTRIAXone (ROCEPHIN) 1 g in sodium chloride 0.9 % 100 mL IVPB  Status:  Discontinued  1 g 200 mL/hr over 30 Minutes Intravenous Every 24 hours 01/13/19 2330 01/14/19 0918   01/13/19 1515  cefTRIAXone (ROCEPHIN) 1 g in sodium chloride 0.9 % 100 mL IVPB     1 g 200 mL/hr  over 30 Minutes Intravenous  Once 01/13/19 1502 01/13/19 1613   01/13/19 1515  azithromycin (ZITHROMAX) 500 mg in sodium chloride 0.9 % 250 mL IVPB     500 mg 250 mL/hr over 60 Minutes Intravenous  Once 01/13/19 1502 01/13/19 1655      Subjective: Patient seen and examined at bedside.  He is a very poor historian.  Still having overnight high fevers.  No worsening shortness of breath, abdominal pain or diarrhea.  Objective: Vitals:   01/16/19 0037 01/16/19 0112 01/16/19 0305 01/16/19 0517  BP:  (!) 94/48  101/65  Pulse:  99  89  Resp:  20    Temp: (!) 101.5 F (38.6 C) 99.2 F (37.3 C) 98.3 F (36.8 C) 98.2 F (36.8 C)  TempSrc: Oral Oral Oral Oral  SpO2:  99%  100%  Weight:      Height:        Intake/Output Summary (Last 24 hours) at 01/16/2019 0734 Last data filed at 01/16/2019 0300 Gross per 24 hour  Intake 1189.58 ml  Output 900 ml  Net 289.58 ml   Filed Weights   01/13/19 1400  Weight: 65 kg    Examination:  General exam: No acute distress.  Looks older than stated age.  Poor historian. Respiratory system: Bilateral decreased breath sounds at bases with scattered crackles.  No wheezing  cardiovascular system: Rate controlled, S1-S2 heard gastrointestinal system: Abdomen is nondistended, soft and nontender. Normal bowel sounds heard. Extremities: No cyanosis, edema  Central nervous system: Alert and oriented but is a poor historian. No focal neurological deficits. Moving extremities Skin: No rashes, lesions or ulcers Psychiatry: Flat affect    Data Reviewed: I have personally reviewed following labs and imaging studies  CBC: Recent Labs  Lab 01/13/19 1509 01/13/19 1518 01/13/19 1816 01/14/19 0511 01/15/19 0358 01/16/19 0015  WBC 21.4*  --   --  46.8* 24.2* 11.7*  NEUTROABS 17.9*  --   --   --  23.0* 10.8*  HGB 13.9 16.0 13.6 12.6* 10.2* 10.0*  HCT 44.6 47.0 40.0 38.8* 30.5* 29.8*  MCV 84.5  --   --  80.5 79.6* 80.8  PLT 667*  --   --  440* 351 917    Basic Metabolic Panel: Recent Labs  Lab 01/13/19 1509 01/13/19 1518 01/13/19 1816 01/14/19 0511 01/15/19 0358 01/16/19 0015  NA 130* 130* 135 137 135 136  K 3.5 4.8 3.0* 3.2* 3.2* 3.8  CL 96*  --   --  109 106 110  CO2 10*  --   --  21* 22 19*  GLUCOSE 401*  --   --  110* 137* 131*  BUN 5*  --   --  <5* <5* 7  CREATININE 1.59*  --   --  0.86 0.70 0.81  CALCIUM 8.8*  --   --  7.7* 7.2* 7.5*  MG  --   --   --  1.6* 1.7 1.7  PHOS  --   --   --  2.9 1.4* 1.8*   GFR: Estimated Creatinine Clearance: 115.9 mL/min (by C-G formula based on SCr of 0.81 mg/dL). Liver Function Tests: Recent Labs  Lab 01/13/19 1509 01/14/19 0511 01/15/19 0358 01/16/19 0015  AST 119* 50* 53* 56*  ALT  42 36 28 27  ALKPHOS 111 70 58 60  BILITOT 0.7 1.1 0.7 0.6  PROT 6.9 4.9* 4.5* 4.8*  ALBUMIN 3.2* 2.2* 1.9* 1.8*   Recent Labs  Lab 01/13/19 1721  LIPASE 50   No results for input(s): AMMONIA in the last 168 hours. Coagulation Profile: Recent Labs  Lab 01/13/19 1509 01/14/19 0511 01/15/19 0358  INR 1.1 1.3* 1.4*   Cardiac Enzymes: No results for input(s): CKTOTAL, CKMB, CKMBINDEX, TROPONINI in the last 168 hours. BNP (last 3 results) No results for input(s): PROBNP in the last 8760 hours. HbA1C: No results for input(s): HGBA1C in the last 72 hours. CBG: Recent Labs  Lab 01/15/19 1156 01/15/19 1644 01/15/19 2025 01/16/19 0026 01/16/19 0521  GLUCAP 96 107* 94 127* 86   Lipid Profile: No results for input(s): CHOL, HDL, LDLCALC, TRIG, CHOLHDL, LDLDIRECT in the last 72 hours. Thyroid Function Tests: No results for input(s): TSH, T4TOTAL, FREET4, T3FREE, THYROIDAB in the last 72 hours. Anemia Panel: No results for input(s): VITAMINB12, FOLATE, FERRITIN, TIBC, IRON, RETICCTPCT in the last 72 hours. Sepsis Labs: Recent Labs  Lab 01/13/19 1721 01/13/19 1942 01/13/19 2259 01/14/19 0511 01/15/19 0358 01/16/19 0015  PROCALCITON 0.10  --   --  4.38 3.34 3.30  LATICACIDVEN 7.0*  3.9* 1.9  --  1.2 0.9    Recent Results (from the past 240 hour(s))  Culture, blood (routine x 2)     Status: None (Preliminary result)   Collection Time: 01/13/19  3:27 PM   Specimen: BLOOD RIGHT HAND  Result Value Ref Range Status   Specimen Description BLOOD RIGHT HAND  Final   Special Requests   Final    BOTTLES DRAWN AEROBIC AND ANAEROBIC Blood Culture results may not be optimal due to an inadequate volume of blood received in culture bottles   Culture   Final    NO GROWTH 2 DAYS Performed at [facility], [place] [address] [place], [place] [address]    Report Status PENDING  Incomplete  SARS Coronavirus 2 [facility] order, Performed in [facility] hospital lab) Nasopharyngeal Nasopharyngeal Swab     Status: None   Collection Time: 01/13/19  3:37 PM   Specimen: Nasopharyngeal Swab  Result Value Ref Range Status   SARS Coronavirus 2 NEGATIVE NEGATIVE Final    Comment: (NOTE) If result is NEGATIVE SARS-CoV-2 target nucleic acids are NOT DETECTED. The SARS-CoV-2 RNA is generally detectable in upper and lower  respiratory specimens during the acute phase of infection. The lowest  concentration of SARS-CoV-2 viral copies this assay can detect is 250  copies / mL. A negative result does not preclude SARS-CoV-2 infection  and should not be used as the sole basis for treatment or other  patient management decisions.  A negative result may occur with  improper specimen collection / handling, submission of specimen other  than nasopharyngeal swab, presence of viral mutation(s) within the  areas targeted by this assay, and inadequate number of viral copies  (<250 copies / mL). A negative result must be combined with clinical  observations, patient history, and epidemiological information. If result is POSITIVE SARS-CoV-2 target nucleic acids are DETECTED. The SARS-CoV-2 RNA is generally detectable in upper and lower  respiratory specimens dur ing the acute phase of infection.   Positive  results are indicative of active infection with SARS-CoV-2.  Clinical  correlation with patient history and other diagnostic information is  necessary to determine patient infection status.  Positive results do  not rule  out bacterial infection or co-infection with other viruses. If result is PRESUMPTIVE POSTIVE SARS-CoV-2 nucleic acids MAY BE PRESENT.   A presumptive positive result was obtained on the submitted specimen  and confirmed on repeat testing.  While 2019 novel coronavirus  (SARS-CoV-2) nucleic acids may be present in the submitted sample  additional confirmatory testing may be necessary for epidemiological  and / or clinical management purposes  to differentiate between  SARS-CoV-2 and other Sarbecovirus currently known to infect humans.  If clinically indicated additional testing with an alternate test  methodology [phone]) is advised. The SARS-CoV-2 RNA is generally  detectable in upper and lower respiratory sp ecimens during the acute  phase of infection. The expected result is Negative. Fact Sheet for Patients:  StrictlyIdeas.no Fact Sheet for Healthcare Providers: BankingDealers.co.za This test is not yet approved or cleared by the [place] FDA and has been authorized for detection and/or diagnosis of SARS-CoV-2 by FDA under an Emergency Use Authorization (EUA).  This EUA will remain in effect (meaning this test can be used) for the duration of the COVID-19 declaration under Section 564(b)(1) of the Act, 21 U.S.C. section 360bbb-3(b)(1), unless the authorization is terminated or revoked sooner. Performed at [facility], [place] [address] [place], [place] [address]   Urine culture     Status: Abnormal   Collection Time: 01/13/19  4:56 PM   Specimen: In/Out Cath Urine  Result Value Ref Range Status   Specimen Description IN/OUT CATH URINE  Final   Special Requests   Final    NONE Performed at  [facility], [place] [address] [place], [place] [address]    Culture (A)  Final    60,000 COLONIES/mL MULTIPLE SPECIES PRESENT, SUGGEST RECOLLECTION   Report Status 01/15/2019 FINAL  Final  C difficile quick scan w PCR reflex     Status: None   Collection Time: 01/13/19  5:35 PM   Specimen: STOOL  Result Value Ref Range Status   C Diff antigen NEGATIVE NEGATIVE Final   C Diff toxin NEGATIVE NEGATIVE Final   C Diff interpretation No C. difficile detected.  Final    Comment: Performed at [facility], [place] [address], [place], [place] [address]  Gastrointestinal Panel by PCR , Stool     Status: None   Collection Time: 01/13/19  5:36 PM   Specimen: STOOL  Result Value Ref Range Status   Campylobacter species NOT DETECTED NOT DETECTED Final   Plesimonas shigelloides NOT DETECTED NOT DETECTED Final   Salmonella species NOT DETECTED NOT DETECTED Final   Yersinia enterocolitica NOT DETECTED NOT DETECTED Final   Vibrio species NOT DETECTED NOT DETECTED Final   Vibrio cholerae NOT DETECTED NOT DETECTED Final   Enteroaggregative E coli (EAEC) NOT DETECTED NOT DETECTED Final   Enteropathogenic E coli (EPEC) NOT DETECTED NOT DETECTED Final   Enterotoxigenic E coli (ETEC) NOT DETECTED NOT DETECTED Final   Shiga like toxin producing E coli (STEC) NOT DETECTED NOT DETECTED Final   Shigella/Enteroinvasive E coli (EIEC) NOT DETECTED NOT DETECTED Final   Cryptosporidium NOT DETECTED NOT DETECTED Final   Cyclospora cayetanensis NOT DETECTED NOT DETECTED Final   Entamoeba histolytica NOT DETECTED NOT DETECTED Final   Giardia lamblia NOT DETECTED NOT DETECTED Final   Adenovirus F40/41 NOT DETECTED NOT DETECTED Final   Astrovirus NOT DETECTED NOT DETECTED Final   Norovirus GI/GII NOT DETECTED NOT DETECTED Final   Rotavirus A NOT DETECTED NOT DETECTED Final   Sapovirus (I,  II, IV, and V) NOT DETECTED NOT DETECTED Final    Comment: Performed at [facility], [place]  [place], [place] [address]  Culture, blood (routine x 2)     Status: None (Preliminary result)   Collection Time: 01/13/19  7:30 PM   Specimen: BLOOD RIGHT ARM  Result Value Ref Range Status   Specimen Description BLOOD RIGHT ARM  Final   Special Requests   Final    BOTTLES DRAWN AEROBIC AND ANAEROBIC Blood Culture results may not be optimal due to an inadequate volume of blood received in culture bottles   Culture   Final    NO GROWTH 2 DAYS Performed at [facility], [place] [address], [place], [place] [address]    Report Status PENDING  Incomplete  MRSA PCR Screening     Status: None   Collection Time: 01/14/19  2:01 AM   Specimen: Nasal Mucosa; Nasopharyngeal  Result Value Ref Range Status   MRSA by PCR NEGATIVE NEGATIVE Final    Comment:        The GeneXpert MRSA Assay (FDA approved for NASAL specimens only), is one component of a comprehensive MRSA colonization surveillance program. It is not intended to diagnose MRSA infection nor to guide or monitor treatment for MRSA infections. Performed at [facility], [place] [address] [place] [address], [place], [place] [address]   Respiratory Panel by PCR     Status: None   Collection Time: 01/14/19  9:14 AM   Specimen: Nasopharyngeal Swab; Respiratory  Result Value Ref Range Status   Adenovirus NOT DETECTED NOT DETECTED Final   Coronavirus 229E NOT DETECTED NOT DETECTED Final    Comment: (NOTE) The Coronavirus on the Respiratory Panel, DOES NOT test for the novel  Coronavirus (2019 nCoV)    Coronavirus HKU1 NOT DETECTED NOT DETECTED Final   Coronavirus NL63 NOT DETECTED NOT DETECTED Final   Coronavirus OC43 NOT DETECTED NOT DETECTED Final   Metapneumovirus NOT DETECTED NOT DETECTED Final   Rhinovirus / Enterovirus NOT DETECTED NOT DETECTED Final   Influenza A NOT DETECTED NOT DETECTED Final   Influenza B NOT DETECTED NOT DETECTED Final   Parainfluenza Virus 1 NOT DETECTED NOT DETECTED Final   Parainfluenza Virus 2 NOT DETECTED NOT  DETECTED Final   Parainfluenza Virus 3 NOT DETECTED NOT DETECTED Final   Parainfluenza Virus 4 NOT DETECTED NOT DETECTED Final   Respiratory Syncytial Virus NOT DETECTED NOT DETECTED Final   Bordetella pertussis NOT DETECTED NOT DETECTED Final   Chlamydophila pneumoniae NOT DETECTED NOT DETECTED Final   Mycoplasma pneumoniae NOT DETECTED NOT DETECTED Final    Comment: Performed at [facility], [place]. [address] [place], [place] [address]  Culture, blood (Routine X 2) w Reflex to ID Panel     Status: None (Preliminary result)   Collection Time: 01/14/19  9:23 PM   Specimen: BLOOD  Result Value Ref Range Status   Specimen Description BLOOD RIGHT ARM  Final   Special Requests   Final    BOTTLES DRAWN AEROBIC AND ANAEROBIC Blood Culture adequate volume   Culture   Final    NO GROWTH < 24 HOURS Performed at [facility], [place] [address] [place] [address] [place], [place] [address]    Report Status PENDING  Incomplete  Culture, blood (Routine X 2) w Reflex to ID Panel     Status: None (Preliminary result)   Collection Time: 01/14/19  9:23 PM   Specimen: BLOOD  Result Value Ref Range  Status   Specimen Description BLOOD LEFT HAND  Final   Special Requests   Final    BOTTLES DRAWN AEROBIC AND ANAEROBIC Blood Culture adequate volume   Culture   Final    NO GROWTH < 24 HOURS Performed at [facility], [address], [place], [place] [address]    Report Status PENDING  Incomplete         Radiology Studies: Dg Chest Port 1 View  Result Date: 01/15/2019 CLINICAL DATA:  Acute respiratory failure with hypoxia. EXAM: PORTABLE CHEST 1 VIEW COMPARISON:  Radiographs of January 13, 2019. FINDINGS: The heart size and mediastinal contours are within normal limits. No pneumothorax or pleural effusion is noted. Left lung is clear. Stable right lower lobe airspace opacity is noted with increased right upper lobe airspace opacity. The visualized skeletal structures are unremarkable. IMPRESSION:  Increased right upper lobe airspace opacity is noted concerning for worsening pneumonia. Stable right lower lobe opacity is noted. Electronically Signed   By: Marijo Conception M.D.   On: 01/15/2019 08:20        Scheduled Meds: . ARIPiprazole  10 mg Oral QHS  . benztropine  1 mg Oral Daily  . Chlorhexidine Gluconate Cloth  6 each Topical Daily  . citalopram  20 mg Oral QHS  . heparin  5,000 Units Subcutaneous Q8H  . influenza vac split quadrivalent PF  0.5 mL Intramuscular Tomorrow-1000  . insulin aspart  1-3 Units Subcutaneous Q4H  . phosphorus  250 mg Oral BID  . sodium chloride flush  3 mL Intravenous Q12H  . traZODone  200 mg Oral QHS   Continuous Infusions: . sodium chloride 125 mL/hr at 01/15/19 2108  . azithromycin 500 mg (01/15/19 1454)  . cefTRIAXone (ROCEPHIN)  IV 1 g (01/15/19 0911)  . sodium chloride 125 mL/hr at 01/16/19 0118     LOS: 3 days        Aline August, MD [facility] 01/16/2019, 7:34 AM

## 2019-01-16 NOTE — Progress Notes (Signed)
[facility] notifeied that patient is r temp 102.1 and hr 113 and resp 27-30 Ibu phren 400 mg given and patient was sponged with cool wash cloths and Ice placed. New orders received

## 2019-01-16 NOTE — Progress Notes (Signed)
RN notified charge nurse Jocelyn Lamer RN) of pt's rapid covid test order. She verbalized she will attempt to complete test prior to night shift beginning if possible.   RN spoke to [facility] MD. He ordered test due to pt continuing to have a low grade fever controlled by tylenol and motrin. He would like 2 negative covid tests to verify result.   If Jocelyn Lamer RN unable to do test Alekh MD notified that task would be passed on to night shift, and at latest SWAT test would complete test tomorrow morning.

## 2019-01-17 LAB — CBC WITH DIFFERENTIAL/PLATELET
Abs Immature Granulocytes: 0.03 10*3/uL (ref 0.00–0.07)
Basophils Absolute: 0 10*3/uL (ref 0.0–0.1)
Basophils Relative: 1 %
Eosinophils Absolute: 0 10*3/uL (ref 0.0–0.5)
Eosinophils Relative: 0 %
HCT: 27.7 % — ABNORMAL LOW (ref 39.0–52.0)
Hemoglobin: 9.3 g/dL — ABNORMAL LOW (ref 13.0–17.0)
Immature Granulocytes: 0 %
Lymphocytes Relative: 4 %
Lymphs Abs: 0.3 10*3/uL — ABNORMAL LOW (ref 0.7–4.0)
MCH: 26.6 pg (ref 26.0–34.0)
MCHC: 33.6 g/dL (ref 30.0–36.0)
MCV: 79.4 fL — ABNORMAL LOW (ref 80.0–100.0)
Monocytes Absolute: 0.5 10*3/uL (ref 0.1–1.0)
Monocytes Relative: 7 %
Neutro Abs: 6.1 10*3/uL (ref 1.7–7.7)
Neutrophils Relative %: 88 %
Platelets: 279 10*3/uL (ref 150–400)
RBC: 3.49 MIL/uL — ABNORMAL LOW (ref 4.22–5.81)
RDW: 13.8 % (ref 11.5–15.5)
WBC: 7 10*3/uL (ref 4.0–10.5)
nRBC: 0 % (ref 0.0–0.2)

## 2019-01-17 LAB — PHOSPHORUS: Phosphorus: 2.4 mg/dL — ABNORMAL LOW (ref 2.5–4.6)

## 2019-01-17 LAB — COMPREHENSIVE METABOLIC PANEL
ALT: 31 U/L (ref 0–44)
AST: 73 U/L — ABNORMAL HIGH (ref 15–41)
Albumin: 1.7 g/dL — ABNORMAL LOW (ref 3.5–5.0)
Alkaline Phosphatase: 71 U/L (ref 38–126)
Anion gap: 8 (ref 5–15)
BUN: <5 mg/dL — ABNORMAL LOW (ref 6–20)
CO2: 23 mmol/L (ref 22–32)
Calcium: 7.5 mg/dL — ABNORMAL LOW (ref 8.9–10.3)
Chloride: 105 mmol/L (ref 98–111)
Creatinine, Ser: 0.78 mg/dL (ref 0.61–1.24)
GFR calc Af Amer: >60 mL/min (ref >60)
GFR calc non Af Amer: >60 mL/min (ref >60)
Glucose, Bld: 105 mg/dL — ABNORMAL HIGH (ref 70–99)
Potassium: 3.3 mmol/L — ABNORMAL LOW (ref 3.5–5.1)
Sodium: 136 mmol/L (ref 135–145)
Total Bilirubin: 0.5 mg/dL (ref 0.3–1.2)
Total Protein: 4.6 g/dL — ABNORMAL LOW (ref 6.5–8.1)

## 2019-01-17 LAB — GLUCOSE, CAPILLARY
Glucose-Capillary: 107 mg/dL — ABNORMAL HIGH (ref 70–99)
Glucose-Capillary: 121 mg/dL — ABNORMAL HIGH (ref 70–99)
Glucose-Capillary: 78 mg/dL (ref 70–99)
Glucose-Capillary: 85 mg/dL (ref 70–99)
Glucose-Capillary: 89 mg/dL (ref 70–99)

## 2019-01-17 LAB — C-REACTIVE PROTEIN: CRP: 14.2 mg/dL — ABNORMAL HIGH (ref <1.0)

## 2019-01-17 LAB — LACTATE DEHYDROGENASE: LDH: 162 U/L (ref 98–192)

## 2019-01-17 LAB — FERRITIN: Ferritin: 156 ng/mL (ref 24–336)

## 2019-01-17 LAB — MAGNESIUM: Magnesium: 1.6 mg/dL — ABNORMAL LOW (ref 1.7–2.4)

## 2019-01-17 MED ORDER — POTASSIUM CHLORIDE CRYS ER 20 MEQ PO TBCR
40.0000 meq | EXTENDED_RELEASE_TABLET | Freq: Once | ORAL | Status: AC
  Administered 2019-01-17: 40 meq via ORAL
  Filled 2019-01-17: qty 2

## 2019-01-17 MED ORDER — MAGNESIUM SULFATE 2 GM/50ML IV SOLN
2.0000 g | Freq: Once | INTRAVENOUS | Status: AC
  Administered 2019-01-17: 2 g via INTRAVENOUS
  Filled 2019-01-17: qty 50

## 2019-01-17 NOTE — Progress Notes (Signed)
09/15  2154  Oral temp of 101.2 . PRN Tylenol given with relief Temp of 98.2 @ 0522   09/16 0133   Pt with ~ 400 cc of non bloody emesis . PRN Zofran given with Relief

## 2019-01-17 NOTE — Progress Notes (Signed)
  Speech Language Pathology Treatment: Dysphagia  Patient Details Name: Carl Carey MRN: [id number] DOB: 07-01-1980 Today's Date: 01/17/2019 Time: 9485-4627 SLP Time Calculation (min) (ACUTE ONLY): 11 min  Assessment / Plan / Recommendation Clinical Impression  Pt was encountered asleep in bed with covers pulled over his head.  Pt presented with low vocal intensity and stuttering.  Pt reported that he consumed approximately half of his breakfast this AM, but that he was still having difficulty masticating solids.  Pt was observed with trials of Dysphagia 2 solids and thin liquid via straw sip.  He masticated solid exclusively on the R side and exhibited prolonged, but effective mastication.  Pt required an independent liquid wash to initiate a swallow.  Pt stated that it was difficult to masticate on his R side, but that it was even harder on the L side because of a self-reported broken tooth.  Discussed the option of downgrading diet to Dysphagia 1 (puree) for increased comfort; however, pt stated that he would prefer to remain on the Dysphagia 2 (fine chop) diet at this time.  Recommend continuation of current diet (Dys 2 solids & thin liquids) with intermittent supervision to ensure use of safe swallow strategies.     HPI HPI: Pt is a 38 yo male presenting with AMS and vomiting, admitted with septic shock and respiratory failure requiring BiPAP. CXR concerning for PNA and possible aspiration. Pt was evaluated by SLP in 2016, initially NPO but then advancing to Dys 2 solids, thin liquids primarily due to mentation. PMH: parkinsonism, panic attack, malnutrition, DM, bipolar 1 d/o      SLP Plan  Continue with current plan of care       Recommendations  Diet recommendations: Dysphagia 2 (fine chop);Thin liquid Liquids provided via: Cup;Straw Medication Administration: Whole meds with liquid Supervision: Intermittent supervision to cue for compensatory strategies Compensations: Small  sips/bites;Slow rate;Minimize environmental distractions Postural Changes and/or Swallow Maneuvers: Seated upright 90 degrees                Oral Care Recommendations: Oral care BID Follow up Recommendations: (TBD) SLP Visit Diagnosis: Dysphagia, oral phase (R13.11) Plan: Continue with current plan of care       Bretta Bang, M.S., [place] Office: [phone]          [place] 01/17/2019, 12:32 PM

## 2019-01-17 NOTE — Progress Notes (Signed)
PROGRESS NOTE    Carl Carey  DTO:[id number] DOB: 1980-06-18 DOA: 01/13/2019 PCP: Patient, No Pcp Per   Brief Narrative:  38 year old male with history of bipolar disorder presented on 01/13/2019 with shortness of breath and respiratory distress along with altered mental status.  He was apparently found by family, lethargic but vomiting.  Patient had recent admission to [facility] for gastroenteritis and hyponatremia and reportedly had enteritis on CT scan at that time with negative stool culture/stool for C. difficile and GI PCR.  On presentation, he was found to have elevated lactic with leukocytosis.  He was started on IV fluids and broad-spectrum antibiotics for possible pneumonia.   Assessment & Plan:   Principal Problem:   Bipolar disorder, in partial remission, most recent episode manic ([place]) Active Problems:   Septic shock (HCC)   Sepsis: Present on admission Community-acquired bacterial pneumonia Leukocytosis: -Likely secondary to the sepsis.  WBC count improving. Acute hypoxic respiratory failure: Required BiPAP initially -Currently on Rocephin and Zithromax. -Blood cultures negative so far.  Urine cultures growing 60,000 colonies of multiple species. urine streptococcal antigen negative.  Respiratory panel PCR negative.  COVID-19 test negative. - Urine Legionella antigen negative. -Still spiking temperatures.  Procalcitonin still at 3.3 yesterday.  Will order for a.m.  -Currently on 2 L oxygen via nasal cannula. -Patient had a recent CAT scan of his chest abdomen pelvis on 01/04/2019 at OSH which only showed some nonspecific enteritis. -2D echo showed EF of 50 to 55% with no valvular vegetation visualized. -Leukocytosis is much improved. -CT of the chest abdomen and pelvis showing multiple lung opacities bilaterally, predominantly right lung consistent with multifocal pneumonia. -Also noted was incidental finding of diffuse wall thickening of esophagus, severe wall  thickening of terminal ileum concerning for inflammation.  No diarrhea reported at this time. -He may need to follow-up with outpatient gastroenterology.  History of bipolar disorder -Patient is on multiple medications.  Medications have been restarted as per psychiatry recommendations.  -Psychiatry consulted.  Continue medications per psychiatry recommendation.  Follow-up outpatient.  Abdominal pain/diarrhea -Questionable cause.  Negative work-up so far including stool for C. difficile/GI PCR and cultures.  Had recent CT which has shown nonspecific enteritis -Might need outpatient GI eval  Hypokalemia - Replacement ordered, monitor.  Hypophosphatemia -Replace, monitor.  Hypoalbuminemia -Nutrition consult  Microcytic anemia -Questionable cause.  Monitor.  No signs of bleeding.   DVT prophylaxis: Heparin Code Status: Full Family Communication: Unable to reach family Disposition Plan: Home in 1 to 2 days if clinically improves  Consultants: PCCM. Psychiatry   Procedures:   None  Antimicrobials:   Azithromycin, ceftriaxone (9/12)   Subjective: Patient is a poor historian.  Had fever spike yesterday.  No report of abdominal pain or diarrhea.  Objective: Vitals:   01/16/19 1607 01/16/19 2129 01/17/19 0522 01/17/19 1232  BP: 119/74 129/78 103/64 99/63  Pulse: 86 95 84 66  Resp: 18 19 16 15   Temp: 98.3 F (36.8 C) (!) 101.2 F (38.4 C) 98.6 F (37 C) 98.3 F (36.8 C)  TempSrc:  Oral Oral Oral  SpO2: 100% 100% 100% 100%  Weight:      Height:        Intake/Output Summary (Last 24 hours) at 01/17/2019 1510 Last data filed at 01/17/2019 1154 Gross per 24 hour  Intake 903 ml  Output 801 ml  Net 102 ml   Filed Weights   01/13/19 1400  Weight: 65 kg    Examination:  General exam: Poor historian, not  in any acute distress Respiratory system: Decreased breath sounds lower lobes, rhonchi, few scattered crackles.  No wheezing. Cardiovascular system: S1 &  S2 heard, No murmur. No pedal edema. Gastrointestinal system: Abdomen is nondistended, soft and nontender. Normal bowel sounds heard. Central nervous system: Alert but poor historian.  Not following commands but moving all extremities. Skin: No rashes, lesions or ulcers Psychiatry: Flat affect    Data Reviewed: I have personally reviewed following labs and imaging studies  CBC: Recent Labs  Lab 01/13/19 1509  01/13/19 1816 01/14/19 0511 01/15/19 0358 01/16/19 0015 01/17/19 0249  WBC 21.4*  --   --  46.8* 24.2* 11.7* 7.0  NEUTROABS 17.9*  --   --   --  23.0* 10.8* 6.1  HGB 13.9   < > 13.6 12.6* 10.2* 10.0* 9.3*  HCT 44.6   < > 40.0 38.8* 30.5* 29.8* 27.7*  MCV 84.5  --   --  80.5 79.6* 80.8 79.4*  PLT 667*  --   --  440* 351 294 279   < > = values in this interval not displayed.   Basic Metabolic Panel: Recent Labs  Lab 01/13/19 1509  01/13/19 1816 01/14/19 0511 01/15/19 0358 01/16/19 0015 01/17/19 0249  NA 130*   < > 135 137 135 136 136  K 3.5   < > 3.0* 3.2* 3.2* 3.8 3.3*  CL 96*  --   --  109 106 110 105  CO2 10*  --   --  21* 22 19* 23  GLUCOSE 401*  --   --  110* 137* 131* 105*  BUN 5*  --   --  <5* <5* 7 <5*  CREATININE 1.59*  --   --  0.86 0.70 0.81 0.78  CALCIUM 8.8*  --   --  7.7* 7.2* 7.5* 7.5*  MG  --   --   --  1.6* 1.7 1.7 1.6*  PHOS  --   --   --  2.9 1.4* 1.8* 2.4*   < > = values in this interval not displayed.   GFR: Estimated Creatinine Clearance: 117.4 mL/min (by C-G formula based on SCr of 0.78 mg/dL). Liver Function Tests: Recent Labs  Lab 01/13/19 1509 01/14/19 0511 01/15/19 0358 01/16/19 0015 01/17/19 0249  AST 119* 50* 53* 56* 73*  ALT 42 36 28 27 31   ALKPHOS 111 70 58 60 71  BILITOT 0.7 1.1 0.7 0.6 0.5  PROT 6.9 4.9* 4.5* 4.8* 4.6*  ALBUMIN 3.2* 2.2* 1.9* 1.8* 1.7*   Recent Labs  Lab 01/13/19 1721  LIPASE 50   No results for input(s): AMMONIA in the last 168 hours. Coagulation Profile: Recent Labs  Lab 01/13/19 1509  01/14/19 0511 01/15/19 0358  INR 1.1 1.3* 1.4*   Cardiac Enzymes: No results for input(s): CKTOTAL, CKMB, CKMBINDEX, TROPONINI in the last 168 hours. BNP (last 3 results) No results for input(s): PROBNP in the last 8760 hours. HbA1C: No results for input(s): HGBA1C in the last 72 hours. CBG: Recent Labs  Lab 01/16/19 2028 01/17/19 0037 01/17/19 0346 01/17/19 0904 01/17/19 1231  GLUCAP 95 107* 89 121* 78   Lipid Profile: No results for input(s): CHOL, HDL, LDLCALC, TRIG, CHOLHDL, LDLDIRECT in the last 72 hours. Thyroid Function Tests: No results for input(s): TSH, T4TOTAL, FREET4, T3FREE, THYROIDAB in the last 72 hours. Anemia Panel: Recent Labs    01/17/19 0249  FERRITIN 156   Sepsis Labs: Recent Labs  Lab 01/13/19 1721 01/13/19 1942 01/13/19 2259 01/14/19 0511 01/15/19 1610  01/16/19 0015  PROCALCITON 0.10  --   --  4.38 3.34 3.30  LATICACIDVEN 7.0* 3.9* 1.9  --  1.2 0.9    Recent Results (from the past 240 hour(s))  Culture, blood (routine x 2)     Status: None (Preliminary result)   Collection Time: 01/13/19  3:27 PM   Specimen: BLOOD RIGHT HAND  Result Value Ref Range Status   Specimen Description BLOOD RIGHT HAND  Final   Special Requests   Final    BOTTLES DRAWN AEROBIC AND ANAEROBIC Blood Culture results may not be optimal due to an inadequate volume of blood received in culture bottles   Culture   Final    NO GROWTH 4 DAYS Performed at [facility], [place] [address], [place], [place] [address]    Report Status PENDING  Incomplete  SARS Coronavirus 2 [facility] order, Performed in [facility] hospital lab) Nasopharyngeal Nasopharyngeal Swab     Status: None   Collection Time: 01/13/19  3:37 PM   Specimen: Nasopharyngeal Swab  Result Value Ref Range Status   SARS Coronavirus 2 NEGATIVE NEGATIVE Final    Comment: (NOTE) If result is NEGATIVE SARS-CoV-2 target nucleic acids are NOT DETECTED. The SARS-CoV-2 RNA is generally detectable in upper and  lower  respiratory specimens during the acute phase of infection. The lowest  concentration of SARS-CoV-2 viral copies this assay can detect is 250  copies / mL. A negative result does not preclude SARS-CoV-2 infection  and should not be used as the sole basis for treatment or other  patient management decisions.  A negative result may occur with  improper specimen collection / handling, submission of specimen other  than nasopharyngeal swab, presence of viral mutation(s) within the  areas targeted by this assay, and inadequate number of viral copies  (<250 copies / mL). A negative result must be combined with clinical  observations, patient history, and epidemiological information. If result is POSITIVE SARS-CoV-2 target nucleic acids are DETECTED. The SARS-CoV-2 RNA is generally detectable in upper and lower  respiratory specimens dur ing the acute phase of infection.  Positive  results are indicative of active infection with SARS-CoV-2.  Clinical  correlation with patient history and other diagnostic information is  necessary to determine patient infection status.  Positive results do  not rule out bacterial infection or co-infection with other viruses. If result is PRESUMPTIVE POSTIVE SARS-CoV-2 nucleic acids MAY BE PRESENT.   A presumptive positive result was obtained on the submitted specimen  and confirmed on repeat testing.  While 2019 novel coronavirus  (SARS-CoV-2) nucleic acids may be present in the submitted sample  additional confirmatory testing may be necessary for epidemiological  and / or clinical management purposes  to differentiate between  SARS-CoV-2 and other Sarbecovirus currently known to infect humans.  If clinically indicated additional testing with an alternate test  methodology [phone]) is advised. The SARS-CoV-2 RNA is generally  detectable in upper and lower respiratory sp ecimens during the acute  phase of infection. The expected result is  Negative. Fact Sheet for Patients:  StrictlyIdeas.no Fact Sheet for Healthcare Providers: BankingDealers.co.za This test is not yet approved or cleared by the [place] FDA and has been authorized for detection and/or diagnosis of SARS-CoV-2 by FDA under an Emergency Use Authorization (EUA).  This EUA will remain in effect (meaning this test can be used) for the duration of the COVID-19 declaration under Section 564(b)(1) of the Act, 21 U.S.C. section 360bbb-3(b)(1), unless the authorization is  terminated or revoked sooner. Performed at [facility], [place] [address] [place], [place] [address]   Urine culture     Status: Abnormal   Collection Time: 01/13/19  4:56 PM   Specimen: In/Out Cath Urine  Result Value Ref Range Status   Specimen Description IN/OUT CATH URINE  Final   Special Requests   Final    NONE Performed at [facility], [place] [address] [place], [place] [address]    Culture (A)  Final    60,000 COLONIES/mL MULTIPLE SPECIES PRESENT, SUGGEST RECOLLECTION   Report Status 01/15/2019 FINAL  Final  C difficile quick scan w PCR reflex     Status: None   Collection Time: 01/13/19  5:35 PM   Specimen: STOOL  Result Value Ref Range Status   C Diff antigen NEGATIVE NEGATIVE Final   C Diff toxin NEGATIVE NEGATIVE Final   C Diff interpretation No C. difficile detected.  Final    Comment: Performed at [facility], [place] [address] [place], [place] [address]  Gastrointestinal Panel by PCR , Stool     Status: None   Collection Time: 01/13/19  5:36 PM   Specimen: STOOL  Result Value Ref Range Status   Campylobacter species NOT DETECTED NOT DETECTED Final   Plesimonas shigelloides NOT DETECTED NOT DETECTED Final   Salmonella species NOT DETECTED NOT DETECTED Final   Yersinia enterocolitica NOT DETECTED NOT DETECTED Final   Vibrio species NOT DETECTED NOT DETECTED Final   Vibrio cholerae NOT DETECTED NOT DETECTED  Final   Enteroaggregative E coli (EAEC) NOT DETECTED NOT DETECTED Final   Enteropathogenic E coli (EPEC) NOT DETECTED NOT DETECTED Final   Enterotoxigenic E coli (ETEC) NOT DETECTED NOT DETECTED Final   Shiga like toxin producing E coli (STEC) NOT DETECTED NOT DETECTED Final   Shigella/Enteroinvasive E coli (EIEC) NOT DETECTED NOT DETECTED Final   Cryptosporidium NOT DETECTED NOT DETECTED Final   Cyclospora cayetanensis NOT DETECTED NOT DETECTED Final   Entamoeba histolytica NOT DETECTED NOT DETECTED Final   Giardia lamblia NOT DETECTED NOT DETECTED Final   Adenovirus F40/41 NOT DETECTED NOT DETECTED Final   Astrovirus NOT DETECTED NOT DETECTED Final   Norovirus GI/GII NOT DETECTED NOT DETECTED Final   Rotavirus A NOT DETECTED NOT DETECTED Final   Sapovirus (I, II, IV, and V) NOT DETECTED NOT DETECTED Final    Comment: Performed at [facility], [place] [place], [place] [address]  Culture, blood (routine x 2)     Status: None (Preliminary result)   Collection Time: 01/13/19  7:30 PM   Specimen: BLOOD RIGHT ARM  Result Value Ref Range Status   Specimen Description BLOOD RIGHT ARM  Final   Special Requests   Final    BOTTLES DRAWN AEROBIC AND ANAEROBIC Blood Culture results may not be optimal due to an inadequate volume of blood received in culture bottles   Culture   Final    NO GROWTH 4 DAYS Performed at [facility], [place] [address], [place], [place] [address]    Report Status PENDING  Incomplete  MRSA PCR Screening     Status: None   Collection Time: 01/14/19  2:01 AM   Specimen: Nasal Mucosa; Nasopharyngeal  Result Value Ref Range Status   MRSA by PCR NEGATIVE NEGATIVE Final    Comment:        The GeneXpert MRSA Assay (FDA approved for NASAL specimens only), is one component of a comprehensive MRSA  colonization surveillance program. It is not intended to diagnose MRSA infection nor to guide or monitor treatment for MRSA infections. Performed at  [facility], [place] [address], [place], [place] [address]   Respiratory Panel by PCR     Status: None   Collection Time: 01/14/19  9:14 AM   Specimen: Nasopharyngeal Swab; Respiratory  Result Value Ref Range Status   Adenovirus NOT DETECTED NOT DETECTED Final   Coronavirus 229E NOT DETECTED NOT DETECTED Final    Comment: (NOTE) The Coronavirus on the Respiratory Panel, DOES NOT test for the novel  Coronavirus (2019 nCoV)    Coronavirus HKU1 NOT DETECTED NOT DETECTED Final   Coronavirus NL63 NOT DETECTED NOT DETECTED Final   Coronavirus OC43 NOT DETECTED NOT DETECTED Final   Metapneumovirus NOT DETECTED NOT DETECTED Final   Rhinovirus / Enterovirus NOT DETECTED NOT DETECTED Final   Influenza A NOT DETECTED NOT DETECTED Final   Influenza B NOT DETECTED NOT DETECTED Final   Parainfluenza Virus 1 NOT DETECTED NOT DETECTED Final   Parainfluenza Virus 2 NOT DETECTED NOT DETECTED Final   Parainfluenza Virus 3 NOT DETECTED NOT DETECTED Final   Parainfluenza Virus 4 NOT DETECTED NOT DETECTED Final   Respiratory Syncytial Virus NOT DETECTED NOT DETECTED Final   Bordetella pertussis NOT DETECTED NOT DETECTED Final   Chlamydophila pneumoniae NOT DETECTED NOT DETECTED Final   Mycoplasma pneumoniae NOT DETECTED NOT DETECTED Final    Comment: Performed at [facility], [place]. [address] [place], [place] [address]  Culture, blood (Routine X 2) w Reflex to ID Panel     Status: None (Preliminary result)   Collection Time: 01/14/19  9:23 PM   Specimen: BLOOD  Result Value Ref Range Status   Specimen Description BLOOD RIGHT ARM  Final   Special Requests   Final    BOTTLES DRAWN AEROBIC AND ANAEROBIC Blood Culture adequate volume   Culture   Final    NO GROWTH 3 DAYS Performed at [facility], [place] [address], [place], [place] [address]    Report Status PENDING  Incomplete  Culture, blood (Routine X 2) w Reflex to ID Panel     Status: None (Preliminary result)   Collection Time:  01/14/19  9:23 PM   Specimen: BLOOD  Result Value Ref Range Status   Specimen Description BLOOD LEFT HAND  Final   Special Requests   Final    BOTTLES DRAWN AEROBIC AND ANAEROBIC Blood Culture adequate volume   Culture   Final    NO GROWTH 3 DAYS Performed at [facility], [place] [address] [place], [place] [address]    Report Status PENDING  Incomplete  SARS CORONAVIRUS 2 (TAT 6-24 HRS) Nasopharyngeal Nasopharyngeal Swab     Status: None   Collection Time: 01/16/19  5:20 PM   Specimen: Nasopharyngeal Swab  Result Value Ref Range Status   SARS Coronavirus 2 NEGATIVE NEGATIVE Final    Comment: (NOTE) SARS-CoV-2 target nucleic acids are NOT DETECTED. The SARS-CoV-2 RNA is generally detectable in upper and lower respiratory specimens during the acute phase of infection. Negative results do not preclude SARS-CoV-2 infection, do not rule out co-infections with other pathogens, and should not be used as the sole basis for treatment or other patient management decisions. Negative results must be combined with clinical observations, patient history, and epidemiological information. The expected result is Negative. Fact Sheet for Patients: SugarRoll.be Fact Sheet for Healthcare Providers: https://www.woods-mathews.com/ This test is not yet approved  or cleared by the [place] and  has been authorized for detection and/or diagnosis of SARS-CoV-2 by FDA under an Emergency Use Authorization (EUA). This EUA will remain  in effect (meaning this test can be used) for the duration of the COVID-19 declaration under Section 56 4(b)(1) of the Act, 21 U.S.C. section 360bbb-3(b)(1), unless the authorization is terminated or revoked sooner. Performed at [facility], [place] [address] [place],  [address]          Radiology Studies: Ct Chest W Contrast  Result Date: 01/16/2019 CLINICAL DATA:  Fever of unknown origin.  Shortness  of breath. EXAM: CT CHEST, ABDOMEN, AND PELVIS WITH CONTRAST TECHNIQUE: Multidetector CT imaging of the chest, abdomen and pelvis was performed following the standard protocol during bolus administration of intravenous contrast. CONTRAST:  116m OMNIPAQUE IOHEXOL 300 MG/ML  SOLN COMPARISON:  None. FINDINGS: CT CHEST FINDINGS Cardiovascular: No significant vascular findings. Normal heart size. No pericardial effusion. Mediastinum/Nodes: Thyroid gland is unremarkable. No adenopathy is noted. Diffuse wall thickening of esophagus is noted concerning for inflammation. Lungs/Pleura: No pneumothorax or pleural effusion is noted. Multiple ill-defined airspace opacities are noted throughout both lungs most consistent with multifocal pneumonia. Mild left basilar opacities are noted concerning for pneumonia. Musculoskeletal: No chest wall mass or suspicious bone lesions identified. CT ABDOMEN PELVIS FINDINGS Hepatobiliary: No focal liver abnormality is seen. No gallstones, gallbladder wall thickening, or biliary dilatation. Pancreas: Unremarkable. No pancreatic ductal dilatation or surrounding inflammatory changes. Spleen: Normal in size without focal abnormality. Adrenals/Urinary Tract: Adrenal glands appear normal. Right renal cyst is noted. No hydronephrosis or renal obstruction is noted. Urinary bladder is unremarkable. No renal or ureteral calculi are noted. Stomach/Bowel: The stomach appears normal. There is no evidence of bowel obstruction. The appendix appears normal. Severe wall thickening of the terminal ileum is noted consistent with inflammation, such as Crohn's disease. Vascular/Lymphatic: No significant vascular findings are present. No enlarged abdominal or pelvic lymph nodes. Reproductive: Prostate is unremarkable. Other: No abdominal wall hernia or abnormality. No abdominopelvic ascites. Musculoskeletal: No acute or significant osseous findings. IMPRESSION: Multiple lung opacities are noted bilaterally, but  predominantly and diffusely in the right lung, most consistent with multifocal pneumonia. Diffuse wall thickening of the esophagus is noted concerning for inflammation; endoscopy is recommended for further evaluation. Severe wall thickening of terminal ileum is noted concerning for inflammation, such as inflammatory bowel disease or Crohn's disease. Electronically Signed   By: JMarijo ConceptionM.D.   On: 01/16/2019 14:03   Ct Abdomen Pelvis W Contrast  Result Date: 01/16/2019 CLINICAL DATA:  Fever of unknown origin.  Shortness of breath. EXAM: CT CHEST, ABDOMEN, AND PELVIS WITH CONTRAST TECHNIQUE: Multidetector CT imaging of the chest, abdomen and pelvis was performed following the standard protocol during bolus administration of intravenous contrast. CONTRAST:  1059mOMNIPAQUE IOHEXOL 300 MG/ML  SOLN COMPARISON:  None. FINDINGS: CT CHEST FINDINGS Cardiovascular: No significant vascular findings. Normal heart size. No pericardial effusion. Mediastinum/Nodes: Thyroid gland is unremarkable. No adenopathy is noted. Diffuse wall thickening of esophagus is noted concerning for inflammation. Lungs/Pleura: No pneumothorax or pleural effusion is noted. Multiple ill-defined airspace opacities are noted throughout both lungs most consistent with multifocal pneumonia. Mild left basilar opacities are noted concerning for pneumonia. Musculoskeletal: No chest wall mass or suspicious bone lesions identified. CT ABDOMEN PELVIS FINDINGS Hepatobiliary: No focal liver abnormality is seen. No gallstones, gallbladder wall thickening, or biliary dilatation. Pancreas: Unremarkable. No pancreatic ductal dilatation or surrounding inflammatory changes. Spleen: Normal  in size without focal abnormality. Adrenals/Urinary Tract: Adrenal glands appear normal. Right renal cyst is noted. No hydronephrosis or renal obstruction is noted. Urinary bladder is unremarkable. No renal or ureteral calculi are noted. Stomach/Bowel: The stomach appears  normal. There is no evidence of bowel obstruction. The appendix appears normal. Severe wall thickening of the terminal ileum is noted consistent with inflammation, such as Crohn's disease. Vascular/Lymphatic: No significant vascular findings are present. No enlarged abdominal or pelvic lymph nodes. Reproductive: Prostate is unremarkable. Other: No abdominal wall hernia or abnormality. No abdominopelvic ascites. Musculoskeletal: No acute or significant osseous findings. IMPRESSION: Multiple lung opacities are noted bilaterally, but predominantly and diffusely in the right lung, most consistent with multifocal pneumonia. Diffuse wall thickening of the esophagus is noted concerning for inflammation; endoscopy is recommended for further evaluation. Severe wall thickening of terminal ileum is noted concerning for inflammation, such as inflammatory bowel disease or Crohn's disease. Electronically Signed   By: Marijo Conception M.D.   On: 01/16/2019 14:03        Scheduled Meds:  ARIPiprazole  10 mg Oral QHS   benztropine  1 mg Oral Daily   Chlorhexidine Gluconate Cloth  6 each Topical Daily   citalopram  20 mg Oral QHS   heparin  5,000 Units Subcutaneous Q8H   influenza vac split quadrivalent PF  0.5 mL Intramuscular Tomorrow-1000   insulin aspart  1-3 Units Subcutaneous Q4H   sodium chloride flush  3 mL Intravenous Q12H   traZODone  200 mg Oral QHS   Continuous Infusions:  sodium chloride 75 mL/hr at 01/17/19 0141   azithromycin 500 mg (01/17/19 1450)   cefTRIAXone (ROCEPHIN)  IV 1 g (01/17/19 1021)   sodium chloride 125 mL/hr at 01/16/19 0118     LOS: 4 days   Yaakov Guthrie, MD [facility] Pager on [place]  If 7PM-7AM, please contact night-coverage [email or web address] Password [email or web address] 01/17/2019, 3:10 PM

## 2019-01-17 NOTE — Progress Notes (Signed)
PT Cancellation Note  Patient Details Name: Carl Carey MRN: [id number] DOB: 09-05-1980   Cancelled Treatment:    Reason Eval/Treat Not Completed: Fatigue/lethargy limiting ability to participate(pt stated he's feeling poorly, too tired/weak to attempt getting up. Will follow.)  Philomena Doheny PT 01/17/2019  Acute Rehabilitation Services Pager [phone] Office [phone]

## 2019-01-18 DIAGNOSIS — Z720 Tobacco use: Secondary | ICD-10-CM

## 2019-01-18 DIAGNOSIS — R509 Fever, unspecified: Secondary | ICD-10-CM

## 2019-01-18 DIAGNOSIS — F3173 Bipolar disorder, in partial remission, most recent episode manic: Secondary | ICD-10-CM

## 2019-01-18 DIAGNOSIS — J189 Pneumonia, unspecified organism: Secondary | ICD-10-CM

## 2019-01-18 LAB — CBC WITH DIFFERENTIAL/PLATELET
Abs Immature Granulocytes: 0.03 10*3/uL (ref 0.00–0.07)
Basophils Absolute: 0 10*3/uL (ref 0.0–0.1)
Basophils Relative: 1 %
Eosinophils Absolute: 0.1 10*3/uL (ref 0.0–0.5)
Eosinophils Relative: 1 %
HCT: 29.1 % — ABNORMAL LOW (ref 39.0–52.0)
Hemoglobin: 9.6 g/dL — ABNORMAL LOW (ref 13.0–17.0)
Immature Granulocytes: 1 %
Lymphocytes Relative: 11 %
Lymphs Abs: 0.5 10*3/uL — ABNORMAL LOW (ref 0.7–4.0)
MCH: 26.7 pg (ref 26.0–34.0)
MCHC: 33 g/dL (ref 30.0–36.0)
MCV: 80.8 fL (ref 80.0–100.0)
Monocytes Absolute: 0.6 10*3/uL (ref 0.1–1.0)
Monocytes Relative: 13 %
Neutro Abs: 3.1 10*3/uL (ref 1.7–7.7)
Neutrophils Relative %: 73 %
Platelets: 292 10*3/uL (ref 150–400)
RBC: 3.6 MIL/uL — ABNORMAL LOW (ref 4.22–5.81)
RDW: 14.1 % (ref 11.5–15.5)
WBC: 4.3 10*3/uL (ref 4.0–10.5)
nRBC: 0 % (ref 0.0–0.2)

## 2019-01-18 LAB — BASIC METABOLIC PANEL
Anion gap: 7 (ref 5–15)
BUN: <5 mg/dL — ABNORMAL LOW (ref 6–20)
CO2: 24 mmol/L (ref 22–32)
Calcium: 7.6 mg/dL — ABNORMAL LOW (ref 8.9–10.3)
Chloride: 107 mmol/L (ref 98–111)
Creatinine, Ser: 0.74 mg/dL (ref 0.61–1.24)
GFR calc Af Amer: >60 mL/min (ref >60)
GFR calc non Af Amer: >60 mL/min (ref >60)
Glucose, Bld: 105 mg/dL — ABNORMAL HIGH (ref 70–99)
Potassium: 4 mmol/L (ref 3.5–5.1)
Sodium: 138 mmol/L (ref 135–145)

## 2019-01-18 LAB — CULTURE, BLOOD (ROUTINE X 2)
Culture: NO GROWTH
Culture: NO GROWTH

## 2019-01-18 LAB — GLUCOSE, CAPILLARY
Glucose-Capillary: 110 mg/dL — ABNORMAL HIGH (ref 70–99)
Glucose-Capillary: 84 mg/dL (ref 70–99)
Glucose-Capillary: 83 mg/dL (ref 70–99)
Glucose-Capillary: 102 mg/dL — ABNORMAL HIGH (ref 70–99)
Glucose-Capillary: 123 mg/dL — ABNORMAL HIGH (ref 70–99)

## 2019-01-18 LAB — PHOSPHORUS: Phosphorus: 2.7 mg/dL (ref 2.5–4.6)

## 2019-01-18 LAB — MAGNESIUM: Magnesium: 2 mg/dL (ref 1.7–2.4)

## 2019-01-18 NOTE — Progress Notes (Signed)
Physical Therapy Treatment Patient Details Name: Carl Carey MRN: [id number] DOB: Mar 09, 1981 Today's Date: 01/18/2019    History of Present Illness 38 year old male with history of bipolar disorder presented on 01/13/2019 with shortness of breath and respiratory distress along with altered mental status.  He was apparently found by family, lethargic but vomiting.  Patient had recent admission to [facility] for gastroenteritis and hyponatremia.  Admitted for CAP.    PT Comments    Patient seen for mobility progression. Pt overall is mod I/Supervision for OOB mobility. Pt tolerated gait distance of 400 ft with SpO2 >92% on RA. Pt requests using AD for ambulation so rollator used.  Continue to work on gait training without AD next session. Current plan remains appropriate.   Follow Up Recommendations  Supervision - Intermittent;No PT follow up     Equipment Recommendations  None recommended by PT    Recommendations for Other Services Other (comment)(social worker consult)     Precautions / Restrictions Precautions Precautions: Fall Restrictions Weight Bearing Restrictions: No    Mobility  Bed Mobility Overal bed mobility: Independent                Transfers Overall transfer level: Modified independent   Transfers: Sit to/from Stand           General transfer comment: pt stood without assist for AD; pt requesting to use AD for gait  Ambulation/Gait Ambulation/Gait assistance: Modified independent (Device/Increase time) Gait Distance (Feet): 400 Feet Assistive device: (rollator) Gait Pattern/deviations: Step-through pattern;Decreased stride length     General Gait Details: pt ambulated ~15 ft in room before using rollator; no balance deficits noted with or without AD but formally tested   Stairs             Wheelchair Mobility    Modified Rankin (Stroke Patients Only)       Balance Overall balance assessment: Needs assistance   Sitting  balance-Leahy Scale: Good       Standing balance-Leahy Scale: Good                              Cognition Arousal/Alertness: Awake/alert Behavior During Therapy: WFL for tasks assessed/performed Overall Cognitive Status: No family/caregiver present to determine baseline cognitive functioning                                 General Comments: alert and oriented, some, likely baseline, cognitive challenges, possibly poor education      Exercises      General Comments General comments (skin integrity, edema, etc.): SpO2 >92% on RA throughout session      Pertinent Vitals/Pain Pain Assessment: No/denies pain    Home Living                      Prior Function            PT Goals (current goals can now be found in the care plan section) Acute Rehab PT Goals Patient Stated Goal: to get SW to see him to help with housing Progress towards PT goals: Progressing toward goals    Frequency    Min 3X/week      PT Plan Current plan remains appropriate    Co-evaluation              AM-PAC PT "6 Clicks" Mobility   Outcome Measure  Help needed turning  from your back to your side while in a flat bed without using bedrails?: None Help needed moving from lying on your back to sitting on the side of a flat bed without using bedrails?: None Help needed moving to and from a bed to a chair (including a wheelchair)?: None Help needed standing up from a chair using your arms (e.g., wheelchair or bedside chair)?: None Help needed to walk in hospital room?: A Little Help needed climbing 3-5 steps with a railing? : A Little 6 Click Score: 22    End of Session   Activity Tolerance: Patient tolerated treatment well Patient left: in bed;with call bell/phone within reach Nurse Communication: Other (comment)(need for OOB and linen change when back from CT) PT Visit Diagnosis: Other abnormalities of gait and mobility (R26.89);Other symptoms and  signs involving the nervous system (R29.898)     Time: 8921-1941 PT Time Calculation (min) (ACUTE ONLY): 20 min  Charges:  $Gait Training: 8-22 mins                     Earney Navy, PTA Acute Rehabilitation Services Pager: [phone] Office: [phone]     Darliss Cheney 01/18/2019, 4:51 PM

## 2019-01-18 NOTE — Progress Notes (Addendum)
PROGRESS NOTE    Carl Carey  IWL:[id number] DOB: 1980/06/26 DOA: 01/13/2019 PCP: Patient, No Pcp Per   Brief Narrative:  38 year old male with history of bipolar disorder presented on 01/13/2019 with shortness of breath and respiratory distress along with altered mental status. He was apparently found by family, lethargic but vomiting. Patient had recent admission to [facility] for gastroenteritis and hyponatremia and reportedly had enteritis on CT scan at that time with negative stool culture/stool for C. difficile and GI PCR. On presentation, he was found to have elevated lactic with leukocytosis. He was started on IV fluids and broad-spectrum antibiotics for possible pneumonia.   Assessment & Plan:   Principal Problem:   Bipolar disorder, in partial remission, most recent episode manic ([place]) Active Problems:   Septic shock (HCC)  Sepsis: Present on admission Community-acquired bacterial pneumonia Leukocytosis: -Likely secondary to the sepsis.  WBC count improving. Acute hypoxic respiratory failure: Required BiPAP initially -Currently on Rocephin and Zithromax. -Blood cultures negative so far.Urine cultures growing 60,000 colonies of multiple species.urine streptococcal antigen negative. Respiratory panel PCR negative. COVID-19 test negative. - Urine Legionella antigen negative. -Still spiking temperatures. Tmax 103 today.  Will consult infectious disease. -Patient had a recent CAT scan of his chest abdomen pelvis on 01/04/2019 at OSH which only showed some nonspecific enteritis. -2D echo showed EF of 50 to 55% with no valvular vegetation visualized. -Leukocytosis is much improved. -CT of the chest abdomen and pelvis showing multiple lung opacities bilaterally, predominantly right lung consistent with multifocal pneumonia. -Also noted was incidental finding of diffuse wall thickening of esophagus, severe wall thickening of terminal ileum concerning for inflammation.   -He may need to follow-up with outpatient gastroenterology.  History of bipolar disorder -Patient is on multiple medications.Medications have been restarted as per psychiatry recommendations. -Psychiatry consulted.  Continue medications per psychiatry recommendation.  Follow-up outpatient.  Abdominal pain/diarrhea -Questionable cause. Negative work-up so far including stool for C. difficile/GI PCR and cultures. Had recent CT which has shown nonspecific enteritis -Might need outpatient GI eval  Hypokalemia - Potassium improved today.  Hypophosphatemia -Replaced.  Hypoalbuminemia -Nutrition consult  Microcytic anemia -Questionable cause. Monitor. No signs of bleeding.  DVT prophylaxis:Heparin Code Status:Full Family Communication:have been unable to reach family Disposition Plan:Home in 1 to 2 days if clinically improves  Consultants:PCCM. Psychiatry   Procedures:   None  Antimicrobials:   Azithromycin, ceftriaxone (9/12)   Subjective: Patient is a poor historian.  Having fevers.  T-max today 103.  Objective: Vitals:   01/18/19 0000 01/18/19 0345 01/18/19 1300 01/18/19 1509  BP: 97/61 102/64  103/64  Pulse: 72 68  61  Resp: 16 19  17   Temp: 99.4 F (37.4 C) 99.1 F (37.3 C) 98.8 F (37.1 C) 98.5 F (36.9 C)  TempSrc: Oral Oral Oral   SpO2: 96% 98%  97%  Weight:      Height:        Intake/Output Summary (Last 24 hours) at 01/18/2019 1933 Last data filed at 01/18/2019 1827 Gross per 24 hour  Intake 750 ml  Output 950 ml  Net -200 ml   Filed Weights   01/13/19 1400  Weight: 65 kg    Examination:  General exam: Poor historian, not in any acute distress Respiratory system: Decreased breath sounds lower lobes, occasional rhonchi, no wheezing Cardiovascular system: S1 & S2 heard. No murmur Gastrointestinal system: Abdomen is nondistended, soft and nontender. Normal bowel sounds heard. Central nervous system: Awake, poor  historian, moving all extremities Skin: No  rashes, lesions or ulcers Psychiatry: Bipolar    Data Reviewed: I have personally reviewed following labs and imaging studies  CBC: Recent Labs  Lab 01/13/19 1509  01/14/19 0511 01/15/19 0358 01/16/19 0015 01/17/19 0249 01/18/19 0213  WBC 21.4*  --  46.8* 24.2* 11.7* 7.0 4.3  NEUTROABS 17.9*  --   --  23.0* 10.8* 6.1 3.1  HGB 13.9   < > 12.6* 10.2* 10.0* 9.3* 9.6*  HCT 44.6   < > 38.8* 30.5* 29.8* 27.7* 29.1*  MCV 84.5  --  80.5 79.6* 80.8 79.4* 80.8  PLT 667*  --  440* 351 294 279 292   < > = values in this interval not displayed.   Basic Metabolic Panel: Recent Labs  Lab 01/14/19 0511 01/15/19 0358 01/16/19 0015 01/17/19 0249 01/18/19 0213  NA 137 135 136 136 138  K 3.2* 3.2* 3.8 3.3* 4.0  CL 109 106 110 105 107  CO2 21* 22 19* 23 24  GLUCOSE 110* 137* 131* 105* 105*  BUN <5* <5* 7 <5* <5*  CREATININE 0.86 0.70 0.81 0.78 0.74  CALCIUM 7.7* 7.2* 7.5* 7.5* 7.6*  MG 1.6* 1.7 1.7 1.6* 2.0  PHOS 2.9 1.4* 1.8* 2.4* 2.7   GFR: Estimated Creatinine Clearance: 117.4 mL/min (by C-G formula based on SCr of 0.74 mg/dL). Liver Function Tests: Recent Labs  Lab 01/13/19 1509 01/14/19 0511 01/15/19 0358 01/16/19 0015 01/17/19 0249  AST 119* 50* 53* 56* 73*  ALT 42 36 28 27 31   ALKPHOS 111 70 58 60 71  BILITOT 0.7 1.1 0.7 0.6 0.5  PROT 6.9 4.9* 4.5* 4.8* 4.6*  ALBUMIN 3.2* 2.2* 1.9* 1.8* 1.7*   Recent Labs  Lab 01/13/19 1721  LIPASE 50   No results for input(s): AMMONIA in the last 168 hours. Coagulation Profile: Recent Labs  Lab 01/13/19 1509 01/14/19 0511 01/15/19 0358  INR 1.1 1.3* 1.4*   Cardiac Enzymes: No results for input(s): CKTOTAL, CKMB, CKMBINDEX, TROPONINI in the last 168 hours. BNP (last 3 results) No results for input(s): PROBNP in the last 8760 hours. HbA1C: No results for input(s): HGBA1C in the last 72 hours. CBG: Recent Labs  Lab 01/17/19 1622 01/18/19 0347 01/18/19 0737 01/18/19 1139  01/18/19 1624  GLUCAP 85 110* 84 83 102*   Lipid Profile: No results for input(s): CHOL, HDL, LDLCALC, TRIG, CHOLHDL, LDLDIRECT in the last 72 hours. Thyroid Function Tests: No results for input(s): TSH, T4TOTAL, FREET4, T3FREE, THYROIDAB in the last 72 hours. Anemia Panel: Recent Labs    01/17/19 0249  FERRITIN 156   Sepsis Labs: Recent Labs  Lab 01/13/19 1721 01/13/19 1942 01/13/19 2259 01/14/19 0511 01/15/19 0358 01/16/19 0015  PROCALCITON 0.10  --   --  4.38 3.34 3.30  LATICACIDVEN 7.0* 3.9* 1.9  --  1.2 0.9    Recent Results (from the past 240 hour(s))  Culture, blood (routine x 2)     Status: None   Collection Time: 01/13/19  3:27 PM   Specimen: BLOOD RIGHT HAND  Result Value Ref Range Status   Specimen Description BLOOD RIGHT HAND  Final   Special Requests   Final    BOTTLES DRAWN AEROBIC AND ANAEROBIC Blood Culture results may not be optimal due to an inadequate volume of blood received in culture bottles   Culture   Final    NO GROWTH 5 DAYS Performed at [facility], [place] [address] [place], [place] [address]    Report Status 01/18/2019 FINAL  Final  SARS Coronavirus 2 [facility] order, Performed in [facility] hospital lab) Nasopharyngeal Nasopharyngeal Swab     Status: None   Collection Time: 01/13/19  3:37 PM   Specimen: Nasopharyngeal Swab  Result Value Ref Range Status   SARS Coronavirus 2 NEGATIVE NEGATIVE Final    Comment: (NOTE) If result is NEGATIVE SARS-CoV-2 target nucleic acids are NOT DETECTED. The SARS-CoV-2 RNA is generally detectable in upper and lower  respiratory specimens during the acute phase of infection. The lowest  concentration of SARS-CoV-2 viral copies this assay can detect is 250  copies / mL. A negative result does not preclude SARS-CoV-2 infection  and should not be used as the sole basis for treatment or other  patient management decisions.  A negative result may occur with  improper specimen collection / handling,  submission of specimen other  than nasopharyngeal swab, presence of viral mutation(s) within the  areas targeted by this assay, and inadequate number of viral copies  (<250 copies / mL). A negative result must be combined with clinical  observations, patient history, and epidemiological information. If result is POSITIVE SARS-CoV-2 target nucleic acids are DETECTED. The SARS-CoV-2 RNA is generally detectable in upper and lower  respiratory specimens dur ing the acute phase of infection.  Positive  results are indicative of active infection with SARS-CoV-2.  Clinical  correlation with patient history and other diagnostic information is  necessary to determine patient infection status.  Positive results do  not rule out bacterial infection or co-infection with other viruses. If result is PRESUMPTIVE POSTIVE SARS-CoV-2 nucleic acids MAY BE PRESENT.   A presumptive positive result was obtained on the submitted specimen  and confirmed on repeat testing.  While 2019 novel coronavirus  (SARS-CoV-2) nucleic acids may be present in the submitted sample  additional confirmatory testing may be necessary for epidemiological  and / or clinical management purposes  to differentiate between  SARS-CoV-2 and other Sarbecovirus currently known to infect humans.  If clinically indicated additional testing with an alternate test  methodology [phone]) is advised. The SARS-CoV-2 RNA is generally  detectable in upper and lower respiratory sp ecimens during the acute  phase of infection. The expected result is Negative. Fact Sheet for Patients:  StrictlyIdeas.no Fact Sheet for Healthcare Providers: BankingDealers.co.za This test is not yet approved or cleared by the [place] FDA and has been authorized for detection and/or diagnosis of SARS-CoV-2 by FDA under an Emergency Use Authorization (EUA).  This EUA will remain in effect (meaning this test can be  used) for the duration of the COVID-19 declaration under Section 564(b)(1) of the Act, 21 U.S.C. section 360bbb-3(b)(1), unless the authorization is terminated or revoked sooner. Performed at [facility], [place] [address], [place], [place] [address]   Urine culture     Status: Abnormal   Collection Time: 01/13/19  4:56 PM   Specimen: In/Out Cath Urine  Result Value Ref Range Status   Specimen Description IN/OUT CATH URINE  Final   Special Requests   Final    NONE Performed at [facility], [place] [address], [place], [place] [address]    Culture (A)  Final    60,000 COLONIES/mL MULTIPLE SPECIES PRESENT, SUGGEST RECOLLECTION   Report Status 01/15/2019 FINAL  Final  C difficile quick scan w PCR reflex     Status: None   Collection Time: 01/13/19  5:35 PM   Specimen: STOOL  Result Value Ref Range Status   C Diff antigen NEGATIVE NEGATIVE Final  C Diff toxin NEGATIVE NEGATIVE Final   C Diff interpretation No C. difficile detected.  Final    Comment: Performed at [facility], [place] [address], [place], [place] [address]  Gastrointestinal Panel by PCR , Stool     Status: None   Collection Time: 01/13/19  5:36 PM   Specimen: STOOL  Result Value Ref Range Status   Campylobacter species NOT DETECTED NOT DETECTED Final   Plesimonas shigelloides NOT DETECTED NOT DETECTED Final   Salmonella species NOT DETECTED NOT DETECTED Final   Yersinia enterocolitica NOT DETECTED NOT DETECTED Final   Vibrio species NOT DETECTED NOT DETECTED Final   Vibrio cholerae NOT DETECTED NOT DETECTED Final   Enteroaggregative E coli (EAEC) NOT DETECTED NOT DETECTED Final   Enteropathogenic E coli (EPEC) NOT DETECTED NOT DETECTED Final   Enterotoxigenic E coli (ETEC) NOT DETECTED NOT DETECTED Final   Shiga like toxin producing E coli (STEC) NOT DETECTED NOT DETECTED Final   Shigella/Enteroinvasive E coli (EIEC) NOT DETECTED NOT DETECTED Final   Cryptosporidium NOT DETECTED NOT DETECTED Final    Cyclospora cayetanensis NOT DETECTED NOT DETECTED Final   Entamoeba histolytica NOT DETECTED NOT DETECTED Final   Giardia lamblia NOT DETECTED NOT DETECTED Final   Adenovirus F40/41 NOT DETECTED NOT DETECTED Final   Astrovirus NOT DETECTED NOT DETECTED Final   Norovirus GI/GII NOT DETECTED NOT DETECTED Final   Rotavirus A NOT DETECTED NOT DETECTED Final   Sapovirus (I, II, IV, and V) NOT DETECTED NOT DETECTED Final    Comment: Performed at [facility], [place] [place], [place] [address]  Culture, blood (routine x 2)     Status: None   Collection Time: 01/13/19  7:30 PM   Specimen: BLOOD RIGHT ARM  Result Value Ref Range Status   Specimen Description BLOOD RIGHT ARM  Final   Special Requests   Final    BOTTLES DRAWN AEROBIC AND ANAEROBIC Blood Culture results may not be optimal due to an inadequate volume of blood received in culture bottles   Culture   Final    NO GROWTH 5 DAYS Performed at [facility], [address], [place], [place] [address]    Report Status 01/18/2019 FINAL  Final  MRSA PCR Screening     Status: None   Collection Time: 01/14/19  2:01 AM   Specimen: Nasal Mucosa; Nasopharyngeal  Result Value Ref Range Status   MRSA by PCR NEGATIVE NEGATIVE Final    Comment:        The GeneXpert MRSA Assay (FDA approved for NASAL specimens only), is one component of a comprehensive MRSA colonization surveillance program. It is not intended to diagnose MRSA infection nor to guide or monitor treatment for MRSA infections. Performed at [facility], [place] [address] [place], [place] [address]   Respiratory Panel by PCR     Status: None   Collection Time: 01/14/19  9:14 AM   Specimen: Nasopharyngeal Swab; Respiratory  Result Value Ref Range Status   Adenovirus NOT DETECTED NOT DETECTED Final   Coronavirus 229E NOT DETECTED NOT DETECTED Final    Comment: (NOTE) The Coronavirus on the Respiratory Panel, DOES NOT test for the novel  Coronavirus  (2019 nCoV)    Coronavirus HKU1 NOT DETECTED NOT DETECTED Final   Coronavirus NL63 NOT DETECTED NOT DETECTED Final   Coronavirus OC43 NOT DETECTED NOT DETECTED Final   Metapneumovirus NOT DETECTED NOT DETECTED Final   Rhinovirus / Enterovirus NOT DETECTED NOT  DETECTED Final   Influenza A NOT DETECTED NOT DETECTED Final   Influenza B NOT DETECTED NOT DETECTED Final   Parainfluenza Virus 1 NOT DETECTED NOT DETECTED Final   Parainfluenza Virus 2 NOT DETECTED NOT DETECTED Final   Parainfluenza Virus 3 NOT DETECTED NOT DETECTED Final   Parainfluenza Virus 4 NOT DETECTED NOT DETECTED Final   Respiratory Syncytial Virus NOT DETECTED NOT DETECTED Final   Bordetella pertussis NOT DETECTED NOT DETECTED Final   Chlamydophila pneumoniae NOT DETECTED NOT DETECTED Final   Mycoplasma pneumoniae NOT DETECTED NOT DETECTED Final    Comment: Performed at [facility], [place] [address], [place], [place] [address]  Culture, blood (Routine X 2) w Reflex to ID Panel     Status: None (Preliminary result)   Collection Time: 01/14/19  9:23 PM   Specimen: BLOOD  Result Value Ref Range Status   Specimen Description BLOOD RIGHT ARM  Final   Special Requests   Final    BOTTLES DRAWN AEROBIC AND ANAEROBIC Blood Culture adequate volume   Culture   Final    NO GROWTH 4 DAYS Performed at [facility], [place] [address], [place], [place] [address]    Report Status PENDING  Incomplete  Culture, blood (Routine X 2) w Reflex to ID Panel     Status: None (Preliminary result)   Collection Time: 01/14/19  9:23 PM   Specimen: BLOOD  Result Value Ref Range Status   Specimen Description BLOOD LEFT HAND  Final   Special Requests   Final    BOTTLES DRAWN AEROBIC AND ANAEROBIC Blood Culture adequate volume   Culture   Final    NO GROWTH 4 DAYS Performed at [facility], [place] [address] [place], [place] [address]    Report Status PENDING  Incomplete  SARS CORONAVIRUS 2 (TAT 6-24 HRS) Nasopharyngeal  Nasopharyngeal Swab     Status: None   Collection Time: 01/16/19  5:20 PM   Specimen: Nasopharyngeal Swab  Result Value Ref Range Status   SARS Coronavirus 2 NEGATIVE NEGATIVE Final    Comment: (NOTE) SARS-CoV-2 target nucleic acids are NOT DETECTED. The SARS-CoV-2 RNA is generally detectable in upper and lower respiratory specimens during the acute phase of infection. Negative results do not preclude SARS-CoV-2 infection, do not rule out co-infections with other pathogens, and should not be used as the sole basis for treatment or other patient management decisions. Negative results must be combined with clinical observations, patient history, and epidemiological information. The expected result is Negative. Fact Sheet for Patients: SugarRoll.be Fact Sheet for Healthcare Providers: https://www.woods-mathews.com/ This test is not yet approved or cleared by the [place] FDA and  has been authorized for detection and/or diagnosis of SARS-CoV-2 by FDA under an Emergency Use Authorization (EUA). This EUA will remain  in effect (meaning this test can be used) for the duration of the COVID-19 declaration under Section 56 4(b)(1) of the Act, 21 U.S.C. section 360bbb-3(b)(1), unless the authorization is terminated or revoked sooner. Performed at [facility], [place] [address], [place], [place] [address]          Radiology Studies: No results found.      Scheduled Meds: . ARIPiprazole  10 mg Oral QHS  . benztropine  1 mg Oral Daily  . Chlorhexidine Gluconate Cloth  6 each Topical Daily  . citalopram  20 mg Oral QHS  . heparin  5,000 Units Subcutaneous Q8H  . influenza vac split quadrivalent PF  0.5 mL Intramuscular Tomorrow-1000  . insulin aspart  1-3 Units Subcutaneous Q4H  . sodium chloride flush  3 mL Intravenous Q12H  . traZODone  200 mg Oral QHS   Continuous Infusions: . sodium chloride 75 mL/hr at 01/18/19 1439  .  sodium chloride 125 mL/hr at 01/16/19 0118     LOS: 5 days    Carl Guthrie, MD [facility] Pager on amion  If 7PM-7AM, please contact night-coverage [email or web address] Password [facility] 01/18/2019, 7:34 PM

## 2019-01-18 NOTE — Consult Note (Signed)
[place] for Infectious Disease       Reason for Consult: fever    Referring Physician: Dr. Barth Kirks  Principal Problem:   Bipolar disorder, in partial remission, most recent episode manic [facility]) Active Problems:   Septic shock ([place])   . ARIPiprazole  10 mg Oral QHS  . benztropine  1 mg Oral Daily  . Chlorhexidine Gluconate Cloth  6 each Topical Daily  . citalopram  20 mg Oral QHS  . heparin  5,000 Units Subcutaneous Q8H  . influenza vac split quadrivalent PF  0.5 mL Intramuscular Tomorrow-1000  . insulin aspart  1-3 Units Subcutaneous Q4H  . sodium chloride flush  3 mL Intravenous Q12H  . traZODone  200 mg Oral QHS    Recommendations: Stop antibiotics GI consultation for ? Endoscopy/diagnosis  Assessment: He has chronic diarrhea for months and was admitted to [facility] health earlier this month with a similar presentation of enteritis of small bowel, now with diffuse esophageal wall thickening, severe wall thickening of the terminal ileum most concerning for an inflammatory bowel disease.  Certainly this can lead to persistent or recurrent fever which he has had.  At this point, having received appropriate antibiotic treatment, I favor a non-infectious etiology of fever such as his possible inflammatory bowel disease.  His pneumonia is multifocal and most c/w a viral etiology.  He has completed a 5 day course of antibiotics though with a possible bacterial etiology.  Could consider aspiration as an etiology if his pulmonary status worsens again and would use Unasyn or flagyl.    Antibiotics: Azithromycin and ceftriaxone x 5 days  HPI: Carl Carey is a 38 y.o. male with bipolar disorder who was initially admitted to the hospital at [facility] with fever and chronic diarrhea.  There he was found to have multiple stools and CT with enteritis.  He was placed on fluids and supportive care and no infection identified.  He was to follow up with an outpatient GI provider but  returned to the ED here with some altered mental status and found near a pool of vomit.  He was admitted to [facility] with sepsis and his CXR c/w diffuse opacities and started on empiric pneumonia treatment with azithromycin and ceftriaxone.  He is out of the ICU and much improved, though some productive cough.  CT with abdominal findings as above.   [facility] record of previous hospitalization reviewed and partially summarized above   Review of Systems:  Constitutional: positive for fevers and anorexia Ears, nose, mouth, throat, and face: positive for sore mouth, particularly when chewing Respiratory: positive for cough or sputum, negative for hemoptysis, pleurisy/chest pain or dyspnea on exertion Gastrointestinal: positive for diarrhea Musculoskeletal: negative for myalgias and arthralgias All other systems reviewed and are negative    Past Medical History:  Diagnosis Date  . Bipolar 1 disorder ([place])   . Diabetes ([place])   . Malnutrition ([place])   . Panic attack   . Parkinsonism [facility])     Social History   Tobacco Use  . Smoking status: Current Some Day Smoker    Types: Cigarettes  . Smokeless tobacco: Never Used  Substance Use Topics  . Alcohol use: Yes    Comment: socially  . Drug use: No    Family History  Problem Relation Age of Onset  . Hypertension Maternal Grandmother     No Known Allergies  Physical Exam: Constitutional: in no apparent distress  Vitals:   01/18/19 0000 01/18/19 0345  BP: 97/61  102/64  Pulse: 72 68  Resp: 16 19  Temp: 99.4 F (37.4 C) 99.1 F (37.3 C)  SpO2: 96% 98%   EYES: anicteric ENMT: no thrush Cardiovascular: Cor RRR Respiratory: normal respiratory effort; CTA B GI: soft, nt Musculoskeletal: no pedal edema noted Skin: negatives: no rash Hematologic: no cervical lad Neuro: non-focal  Lab Results  Component Value Date   WBC 4.3 01/18/2019   HGB 9.6 (L) 01/18/2019   HCT 29.1 (L) 01/18/2019   MCV 80.8 01/18/2019   PLT 292 01/18/2019     Lab Results  Component Value Date   CREATININE 0.74 01/18/2019   BUN <5 (L) 01/18/2019   NA 138 01/18/2019   K 4.0 01/18/2019   CL 107 01/18/2019   CO2 24 01/18/2019    Lab Results  Component Value Date   ALT 31 01/17/2019   AST 73 (H) 01/17/2019   ALKPHOS 71 01/17/2019     Microbiology: Recent Results (from the past 240 hour(s))  Culture, blood (routine x 2)     Status: None (Preliminary result)   Collection Time: 01/13/19  3:27 PM   Specimen: BLOOD RIGHT HAND  Result Value Ref Range Status   Specimen Description BLOOD RIGHT HAND  Final   Special Requests   Final    BOTTLES DRAWN AEROBIC AND ANAEROBIC Blood Culture results may not be optimal due to an inadequate volume of blood received in culture bottles   Culture   Final    NO GROWTH 4 DAYS Performed at [facility], [place] [address], [place], [place] [address]    Report Status PENDING  Incomplete  SARS Coronavirus 2 [facility] order, Performed in [facility] hospital lab) Nasopharyngeal Nasopharyngeal Swab     Status: None   Collection Time: 01/13/19  3:37 PM   Specimen: Nasopharyngeal Swab  Result Value Ref Range Status   SARS Coronavirus 2 NEGATIVE NEGATIVE Final    Comment: (NOTE) If result is NEGATIVE SARS-CoV-2 target nucleic acids are NOT DETECTED. The SARS-CoV-2 RNA is generally detectable in upper and lower  respiratory specimens during the acute phase of infection. The lowest  concentration of SARS-CoV-2 viral copies this assay can detect is 250  copies / mL. A negative result does not preclude SARS-CoV-2 infection  and should not be used as the sole basis for treatment or other  patient management decisions.  A negative result may occur with  improper specimen collection / handling, submission of specimen other  than nasopharyngeal swab, presence of viral mutation(s) within the  areas targeted by this assay, and inadequate number of viral copies  (<250 copies / mL). A negative result must be combined  with clinical  observations, patient history, and epidemiological information. If result is POSITIVE SARS-CoV-2 target nucleic acids are DETECTED. The SARS-CoV-2 RNA is generally detectable in upper and lower  respiratory specimens dur ing the acute phase of infection.  Positive  results are indicative of active infection with SARS-CoV-2.  Clinical  correlation with patient history and other diagnostic information is  necessary to determine patient infection status.  Positive results do  not rule out bacterial infection or co-infection with other viruses. If result is PRESUMPTIVE POSTIVE SARS-CoV-2 nucleic acids MAY BE PRESENT.   A presumptive positive result was obtained on the submitted specimen  and confirmed on repeat testing.  While 2019 novel coronavirus  (SARS-CoV-2) nucleic acids may be present in the submitted sample  additional confirmatory testing may be necessary for epidemiological  and / or clinical  management purposes  to differentiate between  SARS-CoV-2 and other Sarbecovirus currently known to infect humans.  If clinically indicated additional testing with an alternate test  methodology [id number]) is advised. The SARS-CoV-2 RNA is generally  detectable in upper and lower respiratory sp ecimens during the acute  phase of infection. The expected result is Negative. Fact Sheet for Patients:  StrictlyIdeas.no Fact Sheet for Healthcare Providers: BankingDealers.co.za This test is not yet approved or cleared by the [place] FDA and has been authorized for detection and/or diagnosis of SARS-CoV-2 by FDA under an Emergency Use Authorization (EUA).  This EUA will remain in effect (meaning this test can be used) for the duration of the COVID-19 declaration under Section 564(b)(1) of the Act, 21 U.S.C. section 360bbb-3(b)(1), unless the authorization is terminated or revoked sooner. Performed at [facility], [place] [address], [place], [place] [address]   Urine culture     Status: Abnormal   Collection Time: 01/13/19  4:56 PM   Specimen: In/Out Cath Urine  Result Value Ref Range Status   Specimen Description IN/OUT CATH URINE  Final   Special Requests   Final    NONE Performed at [facility], [place] [address], [place], [place] [address]    Culture (A)  Final    60,000 COLONIES/mL MULTIPLE SPECIES PRESENT, SUGGEST RECOLLECTION   Report Status 01/15/2019 FINAL  Final  C difficile quick scan w PCR reflex     Status: None   Collection Time: 01/13/19  5:35 PM   Specimen: STOOL  Result Value Ref Range Status   C Diff antigen NEGATIVE NEGATIVE Final   C Diff toxin NEGATIVE NEGATIVE Final   C Diff interpretation No C. difficile detected.  Final    Comment: Performed at [facility], [place] [address], [place], [place] [address]  Gastrointestinal Panel by PCR , Stool     Status: None   Collection Time: 01/13/19  5:36 PM   Specimen: STOOL  Result Value Ref Range Status   Campylobacter species NOT DETECTED NOT DETECTED Final   Plesimonas shigelloides NOT DETECTED NOT DETECTED Final   Salmonella species NOT DETECTED NOT DETECTED Final   Yersinia enterocolitica NOT DETECTED NOT DETECTED Final   Vibrio species NOT DETECTED NOT DETECTED Final   Vibrio cholerae NOT DETECTED NOT DETECTED Final   Enteroaggregative E coli (EAEC) NOT DETECTED NOT DETECTED Final   Enteropathogenic E coli (EPEC) NOT DETECTED NOT DETECTED Final   Enterotoxigenic E coli (ETEC) NOT DETECTED NOT DETECTED Final   Shiga like toxin producing E coli (STEC) NOT DETECTED NOT DETECTED Final   Shigella/Enteroinvasive E coli (EIEC) NOT DETECTED NOT DETECTED Final   Cryptosporidium NOT DETECTED NOT DETECTED Final   Cyclospora cayetanensis NOT DETECTED NOT DETECTED Final   Entamoeba histolytica NOT DETECTED NOT DETECTED Final   Giardia lamblia NOT DETECTED NOT DETECTED Final   Adenovirus F40/41 NOT DETECTED NOT DETECTED Final    Astrovirus NOT DETECTED NOT DETECTED Final   Norovirus GI/GII NOT DETECTED NOT DETECTED Final   Rotavirus A NOT DETECTED NOT DETECTED Final   Sapovirus (I, II, IV, and V) NOT DETECTED NOT DETECTED Final    Comment: Performed at [facility], [place] [place], [place] [address]  Culture, blood (routine x 2)     Status: None (Preliminary result)   Collection Time: 01/13/19  7:30 PM   Specimen: BLOOD RIGHT ARM  Result Value Ref Range Status   Specimen Description BLOOD RIGHT ARM  Final   Special Requests   Final    BOTTLES DRAWN AEROBIC AND ANAEROBIC Blood Culture results may not be optimal due to an inadequate volume of blood received in culture bottles   Culture   Final    NO GROWTH 4 DAYS Performed at [facility], [place] [address], [place], [place] [address]    Report Status PENDING  Incomplete  MRSA PCR Screening     Status: None   Collection Time: 01/14/19  2:01 AM   Specimen: Nasal Mucosa; Nasopharyngeal  Result Value Ref Range Status   MRSA by PCR NEGATIVE NEGATIVE Final    Comment:        The GeneXpert MRSA Assay (FDA approved for NASAL specimens only), is one component of a comprehensive MRSA colonization surveillance program. It is not intended to diagnose MRSA infection nor to guide or monitor treatment for MRSA infections. Performed at [facility], [place] [address] [place], [place] [address]   Respiratory Panel by PCR     Status: None   Collection Time: 01/14/19  9:14 AM   Specimen: Nasopharyngeal Swab; Respiratory  Result Value Ref Range Status   Adenovirus NOT DETECTED NOT DETECTED Final   Coronavirus 229E NOT DETECTED NOT DETECTED Final    Comment: (NOTE) The Coronavirus on the Respiratory Panel, DOES NOT test for the novel  Coronavirus (2019 nCoV)    Coronavirus HKU1 NOT DETECTED NOT DETECTED Final   Coronavirus NL63 NOT DETECTED NOT DETECTED Final   Coronavirus OC43 NOT DETECTED NOT DETECTED Final   Metapneumovirus NOT DETECTED  NOT DETECTED Final   Rhinovirus / Enterovirus NOT DETECTED NOT DETECTED Final   Influenza A NOT DETECTED NOT DETECTED Final   Influenza B NOT DETECTED NOT DETECTED Final   Parainfluenza Virus 1 NOT DETECTED NOT DETECTED Final   Parainfluenza Virus 2 NOT DETECTED NOT DETECTED Final   Parainfluenza Virus 3 NOT DETECTED NOT DETECTED Final   Parainfluenza Virus 4 NOT DETECTED NOT DETECTED Final   Respiratory Syncytial Virus NOT DETECTED NOT DETECTED Final   Bordetella pertussis NOT DETECTED NOT DETECTED Final   Chlamydophila pneumoniae NOT DETECTED NOT DETECTED Final   Mycoplasma pneumoniae NOT DETECTED NOT DETECTED Final    Comment: Performed at [facility], [place]. [address], [place], [place] [address]  Culture, blood (Routine X 2) w Reflex to ID Panel     Status: None (Preliminary result)   Collection Time: 01/14/19  9:23 PM   Specimen: BLOOD  Result Value Ref Range Status   Specimen Description BLOOD RIGHT ARM  Final   Special Requests   Final    BOTTLES DRAWN AEROBIC AND ANAEROBIC Blood Culture adequate volume   Culture   Final    NO GROWTH 3 DAYS Performed at [facility], [place] [address], [place], [place] [address]    Report Status PENDING  Incomplete  Culture, blood (Routine X 2) w Reflex to ID Panel     Status: None (Preliminary result)   Collection Time: 01/14/19  9:23 PM   Specimen: BLOOD  Result Value Ref Range Status   Specimen Description BLOOD LEFT HAND  Final   Special Requests   Final    BOTTLES DRAWN AEROBIC AND ANAEROBIC Blood Culture adequate volume   Culture   Final    NO GROWTH 3 DAYS Performed at [facility], [place] [address], [place], [place] [address]    Report Status PENDING  Incomplete  SARS CORONAVIRUS 2 (TAT 6-24  HRS) Nasopharyngeal Nasopharyngeal Swab     Status: None   Collection Time: 01/16/19  5:20 PM   Specimen: Nasopharyngeal Swab  Result Value Ref Range Status   SARS Coronavirus 2 NEGATIVE NEGATIVE Final    Comment: (NOTE)  SARS-CoV-2 target nucleic acids are NOT DETECTED. The SARS-CoV-2 RNA is generally detectable in upper and lower respiratory specimens during the acute phase of infection. Negative results do not preclude SARS-CoV-2 infection, do not rule out co-infections with other pathogens, and should not be used as the sole basis for treatment or other patient management decisions. Negative results must be combined with clinical observations, patient history, and epidemiological information. The expected result is Negative. Fact Sheet for Patients: SugarRoll.be Fact Sheet for Healthcare Providers: https://www.woods-mathews.com/ This test is not yet approved or cleared by the [place] FDA and  has been authorized for detection and/or diagnosis of SARS-CoV-2 by FDA under an Emergency Use Authorization (EUA). This EUA will remain  in effect (meaning this test can be used) for the duration of the COVID-19 declaration under Section 56 4(b)(1) of the Act, 21 U.S.C. section 360bbb-3(b)(1), unless the authorization is terminated or revoked sooner. Performed at [facility], [place] [address], [place], [place] [address]     [facility] [address] [place], [place] for Infectious Disease [facility] [email or web address] 01/18/2019, 9:13 AM

## 2019-01-19 LAB — CULTURE, BLOOD (ROUTINE X 2)
Culture: NO GROWTH
Culture: NO GROWTH
Special Requests: ADEQUATE
Special Requests: ADEQUATE

## 2019-01-19 LAB — GLUCOSE, CAPILLARY
Glucose-Capillary: 103 mg/dL — ABNORMAL HIGH (ref 70–99)
Glucose-Capillary: 91 mg/dL (ref 70–99)
Glucose-Capillary: 93 mg/dL (ref 70–99)
Glucose-Capillary: 96 mg/dL (ref 70–99)
Glucose-Capillary: 98 mg/dL (ref 70–99)
Glucose-Capillary: 98 mg/dL (ref 70–99)

## 2019-01-19 LAB — SEDIMENTATION RATE: Sed Rate: 53 mm/hr — ABNORMAL HIGH (ref 0–16)

## 2019-01-19 MED ORDER — INSULIN ASPART 100 UNIT/ML ~~LOC~~ SOLN
0.0000 [IU] | Freq: Three times a day (TID) | SUBCUTANEOUS | Status: DC
  Administered 2019-01-22: 3 [IU] via SUBCUTANEOUS
  Administered 2019-01-22 – 2019-01-24 (×4): 1 [IU] via SUBCUTANEOUS

## 2019-01-19 MED ORDER — INSULIN ASPART 100 UNIT/ML ~~LOC~~ SOLN
0.0000 [IU] | Freq: Every day | SUBCUTANEOUS | Status: DC
  Administered 2019-01-22: 2 [IU] via SUBCUTANEOUS

## 2019-01-19 MED ORDER — PEG 3350-KCL-NA BICARB-NACL 420 G PO SOLR
2000.0000 mL | Freq: Once | ORAL | Status: AC
  Administered 2019-01-19: 2000 mL via ORAL
  Filled 2019-01-19: qty 4000

## 2019-01-19 MED ORDER — PEG 3350-KCL-NA BICARB-NACL 420 G PO SOLR: 4000.0000 mL | Freq: Once | ORAL | Status: DC

## 2019-01-19 MED ORDER — PEG 3350-KCL-NA BICARB-NACL 420 G PO SOLR
2000.0000 mL | Freq: Once | ORAL | Status: DC
  Filled 2019-01-19: qty 4000

## 2019-01-19 NOTE — Consult Note (Signed)
[facility] Consult  Referring Provider: Yaakov Guthrie, MD Primary Care Physician:  Patient, No Pcp Per Primary Gastroenterologist: Althia Forts  Reason for Consultation: Diarrhea, esophageal thickening, TI thickening on CAT scan, fevers  HPI: Carl Carey is a 38 y.o. male was admitted on 01/13/2019 with shortness of breath, altered mental status, recent admission at [facility] for gastroenteritis, hyponatremia and enteritis noted on CAT scan.  As per documentation his stool studies were negative for C. difficile and GI pathogen panel.  GI was consulted as patient had a CAT scan on 01/16/2019 for fever of unknown origin and besides noticing changes of multifocal pneumonia he was noted to have diffuse wall thickening of esophagus and severe wall thickening of terminal ileum consistent with inflammation such as Crohn's disease. Patient states he has had diarrhea for over 4 years, described as multiple, up to 10 episodes of loose watery stools.  He has not noted any blood in stool or black stools.  He has had weight loss of only a few pounds, complains of intermittent generalized abdominal pain. Patient denies oral ulcers but complains of diffuse muscle pain, denies joint pain or skin lesions. Patient denies difficulty swallowing or pain on swallowing. He denies acid reflux or heartburn. No prior EGD or colonoscopy. No family history of Crohn's/ulcerative colitis/colon cancer.  Past Medical History:  Diagnosis Date  . Bipolar 1 disorder ([place])   . Diabetes ([place])   . Malnutrition ([place])   . Panic attack   . Parkinsonism [facility])     Past Surgical History:  Procedure Laterality Date  . MULTIPLE EXTRACTIONS WITH ALVEOLOPLASTY N/A 07/29/2014   Procedure: Extraction of tooth #'s 1,15,16,17,18 with alveoloplasty and gross debridement of teeth;  Surgeon: Lenn Cal, DDS;  Location: [place];  Service: Oral Surgery;  Laterality: N/A;  . NECK SURGERY      Prior to Admission  medications   Medication Sig Start Date End Date Taking? Authorizing Provider  doxylamine, Sleep, (SLEEP AID) 25 MG tablet Take 25 mg by mouth at bedtime as needed for sleep.   Yes [provider]  benztropine (COGENTIN) 1 MG tablet Take 1 tablet (1 mg total) by mouth 2 (two) times daily. 11/13/18   Johnn Hai, MD  haloperidol (HALDOL) 5 MG tablet Take 3 tablets (15 mg total) by mouth at bedtime. 11/13/18   Johnn Hai, MD  haloperidol decanoate (HALDOL DECANOATE) 100 MG/ML injection Inject 1 mL (100 mg total) into the muscle every 30 (thirty) days. Due on/approx 8/13 11/13/18   Johnn Hai, MD  traZODone (DESYREL) 150 MG tablet Take 1 tablet (150 mg total) by mouth at bedtime and may repeat dose one time if needed. 11/13/18   Johnn Hai, MD    Current Facility-Administered Medications  Medication Dose Route Frequency Provider Last Rate Last Dose  . 0.9 %  sodium chloride infusion   Intravenous Continuous Aline August, MD 75 mL/hr at 01/19/19 1053    . acetaminophen (TYLENOL) tablet 650 mg  650 mg Oral Q6H PRN Kipp Brood, MD   650 mg at 01/19/19 0919   Or  . acetaminophen (TYLENOL) suppository 650 mg  650 mg Rectal Q6H PRN Agarwala, Einar Grad, MD      . ARIPiprazole (ABILIFY) tablet 10 mg  10 mg Oral QHS Starla Link, Kshitiz, MD   10 mg at 01/18/19 2210  . benztropine (COGENTIN) tablet 1 mg  1 mg Oral Daily Aline August, MD   1 mg at 01/19/19 0905  . Chlorhexidine Gluconate Cloth 2 % PADS 6  each  6 each Topical Daily Kipp Brood, MD   6 each at 01/19/19 1048  . citalopram (CELEXA) tablet 20 mg  20 mg Oral QHS Alekh, Kshitiz, MD   20 mg at 01/18/19 2210  . guaiFENesin (ROBITUSSIN) 100 MG/5ML solution 100 mg  5 mL Oral Q4H PRN Aline August, MD   100 mg at 01/15/19 2225  . heparin injection 5,000 Units  5,000 Units Subcutaneous Q8H Kipp Brood, MD   5,000 Units at 01/19/19 0453  . hydrOXYzine (ATARAX/VISTARIL) tablet 25 mg  25 mg Oral Q6H PRN Aline August, MD   25 mg at 01/16/19  1719  . ibuprofen (ADVIL) tablet 400 mg  400 mg Oral Q6H PRN Gardiner Barefoot, NP   400 mg at 01/16/19 1459  . influenza vac split quadrivalent PF (FLUARIX) injection 0.5 mL  0.5 mL Intramuscular Tomorrow-1000 Alekh, Kshitiz, MD      . insulin aspart (novoLOG) injection 1-3 Units  1-3 Units Subcutaneous Q4H Kipp Brood, MD   1 Units at 01/18/19 2211  . ondansetron (ZOFRAN) injection 4 mg  4 mg Intravenous Q6H PRN Elsie Lincoln, MD   4 mg at 01/17/19 0133  . polyethylene glycol-electrolytes (NuLYTELY/GoLYTELY) solution 2,000 mL  2,000 mL Oral Once Yaakov Guthrie, MD       Followed by  . [START ON 01/20/2019] polyethylene glycol-electrolytes (NuLYTELY/GoLYTELY) solution 2,000 mL  2,000 mL Oral Once Matcha, Anupama, MD      . sodium chloride 0.9 % bolus 500 mL  500 mL Intravenous Once Gardiner Barefoot, NP 125 mL/hr at 01/16/19 0118    . sodium chloride flush (NS) 0.9 % injection 3 mL  3 mL Intravenous Q12H Kipp Brood, MD   3 mL at 01/17/19 0135  . traZODone (DESYREL) tablet 200 mg  200 mg Oral QHS Aline August, MD   200 mg at 01/18/19 2211    Allergies as of 01/13/2019  . (No Known Allergies)    Family History  Problem Relation Age of Onset  . Hypertension Maternal Grandmother     Social History   Socioeconomic History  . Marital status: Single    Spouse name: Not on file  . Number of children: Not on file  . Years of education: Not on file  . Highest education level: Not on file  Occupational History  . Not on file  Social Needs  . Financial resource strain: Not on file  . Food insecurity    Worry: Not on file    Inability: Not on file  . Transportation needs    Medical: Not on file    Non-medical: Not on file  Tobacco Use  . Smoking status: Current Some Day Smoker    Types: Cigarettes  . Smokeless tobacco: Never Used  Substance and Sexual Activity  . Alcohol use: Yes    Comment: socially  . Drug use: No  . Sexual activity: Not on file  Lifestyle  .  Physical activity    Days per week: Not on file    Minutes per session: Not on file  . Stress: Not on file  Relationships  . Social Herbalist on phone: Not on file    Gets together: Not on file    Attends religious service: Not on file    Active member of club or organization: Not on file    Attends meetings of clubs or organizations: Not on file    Relationship status: Not on file  . Intimate partner violence  Fear of current or ex partner: Not on file    Emotionally abused: Not on file    Physically abused: Not on file    Forced sexual activity: Not on file  Other Topics Concern  . Not on file  Social History Narrative  . Not on file    Review of Systems: Positive for: GI: Described in detail in HPI.    Gen: fever, chills, rigors, night sweats, anorexia, fatigue, weakness, malaise, involuntary weight loss, Denies any sleep disorder CV: Denies chest pain, angina, palpitations, syncope, orthopnea, PND, peripheral edema, and claudication. Resp: dyspnea, cough, sputum, Denies wheezing, coughing up blood. GU : Denies urinary burning, blood in urine, urinary frequency, urinary hesitancy, nocturnal urination, and urinary incontinence. MS: Generalized muscle pain ,denies joint pain or swelling.  Denies muscle weakness, cramps, atrophy.  Derm: Denies rash, itching, oral ulcerations, hives, unhealing ulcers.  Psych: Denies depression, anxiety, memory loss, suicidal ideation, hallucinations,  and confusion. Heme: Denies bruising, bleeding, and enlarged lymph nodes. Neuro:  Denies any headaches, dizziness, paresthesias. Endo:  Denies any problems with DM, thyroid, adrenal function.  Physical Exam: Vital signs in last 24 hours: Temp:  [98.5 F (36.9 C)-100.4 F (38 C)] 99.5 F (37.5 C) (09/18 0500) Pulse Rate:  [61-79] 79 (09/18 0500) Resp:  [14-17] 16 (09/18 0500) BP: (103-112)/(64-72) 112/72 (09/18 0500) SpO2:  [97 %-99 %] 99 % (09/17 2135) Last BM Date:  01/18/19  General:   Stuttering noted, not in distress, ill-appearing Head:  Normocephalic and atraumatic. Eyes:  Sclera clear, no icterus.   Mild pallor Ears:  Normal auditory acuity. Nose:  No deformity, discharge,  or lesions. Mouth:  No deformity or lesions.  Oropharynx pink & moist. Neck:  Supple; no masses or thyromegaly. Lungs: Scattered crackles heard in both lung fields Heart:  Regular rate and rhythm; no murmurs, clicks, rubs,  or gallops. Extremities:  Without clubbing or edema. Neurologic:  Alert and  oriented x4;  grossly normal neurologically. Skin:  Intact without significant lesions or rashes. Psych:  Alert and cooperative. Normal mood and affect. Abdomen:  Soft, nontender and nondistended. No masses, hepatosplenomegaly or hernias noted. Normal bowel sounds, without guarding, and without rebound.         Lab Results: Recent Labs    01/17/19 0249 01/18/19 0213  WBC 7.0 4.3  HGB 9.3* 9.6*  HCT 27.7* 29.1*  PLT 279 292   BMET Recent Labs    01/17/19 0249 01/18/19 0213  NA 136 138  K 3.3* 4.0  CL 105 107  CO2 23 24  GLUCOSE 105* 105*  BUN <5* <5*  CREATININE 0.78 0.74  CALCIUM 7.5* 7.6*   LFT Recent Labs    01/17/19 0249  PROT 4.6*  ALBUMIN 1.7*  AST 73*  ALT 31  ALKPHOS 71  BILITOT 0.5   PT/INR No results for input(s): LABPROT, INR in the last 72 hours.  Studies/Results: No results found.  Impression: Sepsis on presentation, pneumonia noted on CAT scan, acute hypoxic respiratory failure requiring BiPAP initially T-max of 103, ID on board Severe esophageal and terminal wall thickening suspicious for Crohn's, history of diarrhea for over 4 years, as per documentation enteritis noted on CAT scan at outside facility, negative infectious work-up for stool(C. difficile and GI pathogen panel from 01/13/2019 normal) Normocytic anemia SARS Coronavirus 2 NEGATIVE  Bipolar disorder  Plan: Proceed with diagnostic endoscopy and colonoscopy. The  risks and the benefits of the procedure were discussed with the patient in details. He understands and  verbalizes consent. He will be kept on clear liquid diet and kept n.p.o. post midnight except colonic prep. ESR and CRP to be sent.   LOS: 6 days   Ronnette Juniper, MD  01/19/2019, 1:06 PM  Pager [phone] If no answer or after 5 PM call [phone]

## 2019-01-19 NOTE — TOC Initial Note (Addendum)
Transition of Care [facility]) - Initial/Assessment Note    Patient Details  Name: Carl Carey MRN: [id number] Date of Birth: 10/12/1980  [facility] CM/SW Contact:    Sharin Mons, RN Phone Number: 01/19/2019, 1:28 PM  Clinical Narrative:                 Presents with septic shock and respiratory failure. Hx of bipolar disorder. States lives with mom, however, not happy about returning to home with mom once d/c. Pt states sleeps on a couch in the living area,  and there's a lot of activity from people coming in and out from that area which leads to min. rest for him. NCM with min. options ... shelter list shared. Pt declined. [facility] referral made.  Pt would probably benefit from home health services RN and SW for disease management and community resources. NCM will need F2F completed and home health orders written from MD.   [facility] f/u scheduled:  [place]   [phone] [place] [address]    Next Steps: Follow up on 01/30/2019    Instructions: 11 am with Dr. Tia Masker        NCM will continue to monitor for TOC needs ...   Pt states will have transportation to home when d/c.  Expected Discharge Plan: [place] Barriers to Discharge: Continued Medical Work up   Patient Goals and CMS Choice        Expected Discharge Plan and Services Expected Discharge Plan: [place], pending MD's order. NCM made referral with [facility] via voice message ... awaiting acceptance / denial.  01/19/2019 [facility] accepted pt for home health services.  01/24/2019 [facility] initially thought they provides services, however , rescinded 2/2 manpower. [facility] declined 2/2 payor source.  01/25/2019 Referral made with [facility] for home health services ... awaiting response.  01/25/2019 1056   NCM learned from [facility] they are unable to accept   Referral made with [facility] however services declined 2/2 to resources not available. Referral made with [facility] and declined.  Discharge Planning Services: CM Consult   Living arrangements for the past 2 months: Single Family Home                           HH Arranged: RN, Social Work          Prior Living Arrangements/Services Living arrangements for the past 2 months: [place]   Patient language and need for interpreter reviewed:: Yes Do you feel safe going back to the place where you live?: Yes      Need for Family Participation in Patient Care: No (Comment) Care giver support system in place?: Yes (comment)   Criminal Activity/Legal Involvement Pertinent to Current Situation/Hospitalization: No - Comment as needed  Activities of Daily Living   ADL Screening (condition at time of admission) Patient's cognitive ability adequate to safely complete daily activities?: Yes Is the patient deaf or have difficulty hearing?: No Does the patient have difficulty seeing, even when wearing glasses/contacts?: No Does the patient have difficulty concentrating, remembering, or making decisions?: No Patient able to express need for assistance with ADLs?: Yes Does the patient have difficulty dressing or bathing?: No Independently performs ADLs?: Yes (appropriate for developmental age) Does the patient have difficulty walking or climbing stairs?: Yes Weakness of Legs: Both Weakness  of Arms/Hands: None  Permission Sought/Granted Permission sought to share information with : Case Manager, Family Supports Permission granted to share information with : Yes, Verbal Permission Granted  Share Information with NAME: Errik Mitchelle (Mother)[phone]           Emotional Assessment Appearance:: Appears stated age Attitude/Demeanor/Rapport: Engaged Affect (typically observed): Accepting Orientation: : Oriented to Self, Oriented to Place, Oriented to  Time, Oriented  to Situation Alcohol / Substance Use: Not Applicable Psych Involvement: No (comment)  Admission diagnosis:  Lactic acidosis [E87.2] Hyperglycemia [R73.9] Acute respiratory failure with hypoxia ([place]) [J96.01] AKI (acute kidney injury) ([place]) [N17.9] Multifocal pneumonia [J18.9] Septic shock ([place]) [A41.9, R65.21] Patient Active Problem List   Diagnosis Date Noted  . Bipolar disorder, in partial remission, most recent episode manic ([place])   . Septic shock ([place]) 01/13/2019  . Bipolar 1 disorder ([place]) 11/09/2018  . Fever in adult 07/25/2014  . Protein-calorie malnutrition, severe ([place]) 07/23/2014  . Parkinsonian features 07/21/2014  . Dysphagia 07/21/2014  . Microcytic anemia 07/21/2014   PCP:  Patient, No Pcp Per Pharmacy:   [id number]: [phone]: [phone]    Social Determinants of Health (SDOH) Interventions    Readmission Risk Interventions No flowsheet data found.

## 2019-01-19 NOTE — Progress Notes (Signed)
PROGRESS NOTE    Carl Carey  UTM:[id number] DOB: 03/16/1981 DOA: 01/13/2019 PCP: Patient, No Pcp Per    Brief Narrative:  38 year old male with history of bipolar disorder presented on 01/13/2019 with shortness of breath and respiratory distress along with altered mental status. He was apparently found by family, lethargic but vomiting. Patient had recent admission to [facility] for gastroenteritis and hyponatremia and reportedly had enteritis on CT scan at that time with negative stool culture/stool for C. difficile and GI PCR. On presentation, he was found to have elevated lactic with leukocytosis. He was started on IV fluids and broad-spectrum antibiotics for possible pneumonia. Now off antibiotics but still having fevers.   Assessment & Plan:   Principal Problem:   Bipolar disorder, in partial remission, most recent episode manic ([place]) Active Problems:   Septic shock (HCC)  Sepsis: Present on admission Community-acquired bacterial pneumonia Leukocytosis: -Likely secondary to the sepsis. WBC count improved. Acute hypoxic respiratory failure: RequiredBiPAP initially -Completed Rocephin and Zithromax. -Blood cultures negative.Urine cultures growing 60,000 colonies of multiple species.urine streptococcal antigen negative. Respiratory panel PCR negative. COVID-19 test negative. -Urine Legionella antigennegative. -Still spiking temperatures. Tmax 103.  Consulted infectious disease. -Patient had a recent CAT scan of his chest abdomen pelvis on 01/04/2019 at OSH which only showed some nonspecific enteritis. -2D echo showed EF of 50 to 55% with no valvular vegetation visualized. - Repeat CT of the chest abdomen and pelvison 01/16/2019 showed multiple lung opacities bilaterally, predominantly right lung consistent with multifocal pneumonia. -Also noted was incidental finding of diffuse wall thickening of esophagus, severe wall thickening of terminal ileum concerning for  inflammation.He says he has diarrhea. - Consulted GI.   History of bipolar disorder -Patient is on multiple medications.Medications have been restarted as per psychiatry recommendations.-Psychiatry consulted. Continue medications per psychiatry recommendation. Follow-up outpatient.  Abdominal pain/diarrhea -Negative work-up so far including stool for C. difficile/GI PCR and cultures. Had recent CT which showed multiple lung opacities as mentioned above, diffuse wall thickening of the esophagus, severe wall thickening of the terminal ileum.  Concern for inflammatory bowel disease. -Consulted gastroenterology.  Hypokalemia -Potassium improved.  Hypophosphatemia -Replaced.  Hypoalbuminemia -Nutrition consult  Microcytic anemia -Questionable cause. Monitor. No signs of bleeding.  DVT prophylaxis:Heparin Code Status:Full Family Communication:have been unable to reach family Disposition Plan:Plan to discharge home once symptoms improve  Consultants:PCCM.Psychiatry  Infectious disease, gastroenterology   Procedures:  None  Antimicrobials:  Azithromycin, ceftriaxone(9/12- 9/16)   Subjective: He is complaining of diarrhea. Having fever, fatigue.  Objective: Vitals:   01/18/19 2135 01/18/19 2200 01/19/19 0500 01/19/19 1409  BP: 110/68  112/72 110/71  Pulse: 77  79 62  Resp: 14  16 15   Temp: (!) 100.4 F (38 C) 100 F (37.8 C) 99.5 F (37.5 C) 98.6 F (37 C)  TempSrc: Oral Oral Oral Oral  SpO2: 99%   100%  Weight:      Height:        Intake/Output Summary (Last 24 hours) at 01/19/2019 1806 Last data filed at 01/19/2019 1716 Gross per 24 hour  Intake 13134.74 ml  Output 311 ml  Net 12823.74 ml   Filed Weights   01/13/19 1400  Weight: 65 kg    Examination:  General exam: Patient stuttering, awake, not in any acute distress at this time Respiratory system: Decreased breath sounds, rhonchi, crackles, no wheezing Cardiovascular  system: S1 & S2. No murmur. Gastrointestinal system: Abdomen is nondistended, soft and nontender. Normal bowel sounds heard. Central nervous system: Alert and  oriented. No focal neurological deficits. Extremities: Symmetric 5 x 5 power. Skin: No rashes, lesions or ulcers Psychiatry: Has bipolar    Data Reviewed: I have personally reviewed following labs and imaging studies  CBC: Recent Labs  Lab 01/13/19 1509  01/14/19 0511 01/15/19 0358 01/16/19 0015 01/17/19 0249 01/18/19 0213  WBC 21.4*  --  46.8* 24.2* 11.7* 7.0 4.3  NEUTROABS 17.9*  --   --  23.0* 10.8* 6.1 3.1  HGB 13.9   < > 12.6* 10.2* 10.0* 9.3* 9.6*  HCT 44.6   < > 38.8* 30.5* 29.8* 27.7* 29.1*  MCV 84.5  --  80.5 79.6* 80.8 79.4* 80.8  PLT 667*  --  440* 351 294 279 292   < > = values in this interval not displayed.   Basic Metabolic Panel: Recent Labs  Lab 01/14/19 0511 01/15/19 0358 01/16/19 0015 01/17/19 0249 01/18/19 0213  NA 137 135 136 136 138  K 3.2* 3.2* 3.8 3.3* 4.0  CL 109 106 110 105 107  CO2 21* 22 19* 23 24  GLUCOSE 110* 137* 131* 105* 105*  BUN <5* <5* 7 <5* <5*  CREATININE 0.86 0.70 0.81 0.78 0.74  CALCIUM 7.7* 7.2* 7.5* 7.5* 7.6*  MG 1.6* 1.7 1.7 1.6* 2.0  PHOS 2.9 1.4* 1.8* 2.4* 2.7   GFR: Estimated Creatinine Clearance: 117.4 mL/min (by C-G formula based on SCr of 0.74 mg/dL). Liver Function Tests: Recent Labs  Lab 01/13/19 1509 01/14/19 0511 01/15/19 0358 01/16/19 0015 01/17/19 0249  AST 119* 50* 53* 56* 73*  ALT 42 36 28 27 31   ALKPHOS 111 70 58 60 71  BILITOT 0.7 1.1 0.7 0.6 0.5  PROT 6.9 4.9* 4.5* 4.8* 4.6*  ALBUMIN 3.2* 2.2* 1.9* 1.8* 1.7*   Recent Labs  Lab 01/13/19 1721  LIPASE 50   No results for input(s): AMMONIA in the last 168 hours. Coagulation Profile: Recent Labs  Lab 01/13/19 1509 01/14/19 0511 01/15/19 0358  INR 1.1 1.3* 1.4*   Cardiac Enzymes: No results for input(s): CKTOTAL, CKMB, CKMBINDEX, TROPONINI in the last 168 hours. BNP (last 3  results) No results for input(s): PROBNP in the last 8760 hours. HbA1C: No results for input(s): HGBA1C in the last 72 hours. CBG: Recent Labs  Lab 01/19/19 0055 01/19/19 0434 01/19/19 0846 01/19/19 1234 01/19/19 1701  GLUCAP 98 98 103* 93 96   Lipid Profile: No results for input(s): CHOL, HDL, LDLCALC, TRIG, CHOLHDL, LDLDIRECT in the last 72 hours. Thyroid Function Tests: No results for input(s): TSH, T4TOTAL, FREET4, T3FREE, THYROIDAB in the last 72 hours. Anemia Panel: Recent Labs    01/17/19 0249  FERRITIN 156   Sepsis Labs: Recent Labs  Lab 01/13/19 1721 01/13/19 1942 01/13/19 2259 01/14/19 0511 01/15/19 0358 01/16/19 0015  PROCALCITON 0.10  --   --  4.38 3.34 3.30  LATICACIDVEN 7.0* 3.9* 1.9  --  1.2 0.9    Recent Results (from the past 240 hour(s))  Culture, blood (routine x 2)     Status: None   Collection Time: 01/13/19  3:27 PM   Specimen: BLOOD RIGHT HAND  Result Value Ref Range Status   Specimen Description BLOOD RIGHT HAND  Final   Special Requests   Final    BOTTLES DRAWN AEROBIC AND ANAEROBIC Blood Culture results may not be optimal due to an inadequate volume of blood received in culture bottles   Culture   Final    NO GROWTH 5 DAYS Performed at [facility], [place]  [address] [place], [place] [address]    Report Status 01/18/2019 FINAL  Final  SARS Coronavirus 2 [facility] order, Performed in [facility] lab) Nasopharyngeal Nasopharyngeal Swab     Status: None   Collection Time: 01/13/19  3:37 PM   Specimen: Nasopharyngeal Swab  Result Value Ref Range Status   SARS Coronavirus 2 NEGATIVE NEGATIVE Final    Comment: (NOTE) If result is NEGATIVE SARS-CoV-2 target nucleic acids are NOT DETECTED. The SARS-CoV-2 RNA is generally detectable in upper and lower  respiratory specimens during the acute phase of infection. The lowest  concentration of SARS-CoV-2 viral copies this assay can detect is 250  copies / mL. A negative result does  not preclude SARS-CoV-2 infection  and should not be used as the sole basis for treatment or other  patient management decisions.  A negative result may occur with  improper specimen collection / handling, submission of specimen other  than nasopharyngeal swab, presence of viral mutation(s) within the  areas targeted by this assay, and inadequate number of viral copies  (<250 copies / mL). A negative result must be combined with clinical  observations, patient history, and epidemiological information. If result is POSITIVE SARS-CoV-2 target nucleic acids are DETECTED. The SARS-CoV-2 RNA is generally detectable in upper and lower  respiratory specimens dur ing the acute phase of infection.  Positive  results are indicative of active infection with SARS-CoV-2.  Clinical  correlation with patient history and other diagnostic information is  necessary to determine patient infection status.  Positive results do  not rule out bacterial infection or co-infection with other viruses. If result is PRESUMPTIVE POSTIVE SARS-CoV-2 nucleic acids MAY BE PRESENT.   A presumptive positive result was obtained on the submitted specimen  and confirmed on repeat testing.  While 2019 novel coronavirus  (SARS-CoV-2) nucleic acids may be present in the submitted sample  additional confirmatory testing may be necessary for epidemiological  and / or clinical management purposes  to differentiate between  SARS-CoV-2 and other Sarbecovirus currently known to infect humans.  If clinically indicated additional testing with an alternate test  methodology [phone]) is advised. The SARS-CoV-2 RNA is generally  detectable in upper and lower respiratory sp ecimens during the acute  phase of infection. The expected result is Negative. Fact Sheet for Patients:  StrictlyIdeas.no Fact Sheet for Healthcare Providers: BankingDealers.co.za This test is not yet approved or  cleared by the [place] FDA and has been authorized for detection and/or diagnosis of SARS-CoV-2 by FDA under an Emergency Use Authorization (EUA).  This EUA will remain in effect (meaning this test can be used) for the duration of the COVID-19 declaration under Section 564(b)(1) of the Act, 21 U.S.C. section 360bbb-3(b)(1), unless the authorization is terminated or revoked sooner. Performed at [facility], [place] [address], [place], [place] [address]   Urine culture     Status: Abnormal   Collection Time: 01/13/19  4:56 PM   Specimen: In/Out Cath Urine  Result Value Ref Range Status   Specimen Description IN/OUT CATH URINE  Final   Special Requests   Final    NONE Performed at [facility], [place] [address] [place], [place] [address]    Culture (A)  Final    60,000 COLONIES/mL MULTIPLE SPECIES PRESENT, SUGGEST RECOLLECTION   Report Status 01/15/2019 FINAL  Final  C difficile quick scan w PCR reflex     Status: None   Collection Time: 01/13/19  5:35 PM   Specimen: STOOL  Result Value Ref Range Status   C Diff antigen NEGATIVE NEGATIVE Final   C Diff toxin NEGATIVE NEGATIVE Final   C Diff interpretation No C. difficile detected.  Final    Comment: Performed at [facility], [place] [address], [place], [place] [address]  Gastrointestinal Panel by PCR , Stool     Status: None   Collection Time: 01/13/19  5:36 PM   Specimen: STOOL  Result Value Ref Range Status   Campylobacter species NOT DETECTED NOT DETECTED Final   Plesimonas shigelloides NOT DETECTED NOT DETECTED Final   Salmonella species NOT DETECTED NOT DETECTED Final   Yersinia enterocolitica NOT DETECTED NOT DETECTED Final   Vibrio species NOT DETECTED NOT DETECTED Final   Vibrio cholerae NOT DETECTED NOT DETECTED Final   Enteroaggregative E coli (EAEC) NOT DETECTED NOT DETECTED Final   Enteropathogenic E coli (EPEC) NOT DETECTED NOT DETECTED Final   Enterotoxigenic E coli (ETEC) NOT DETECTED NOT  DETECTED Final   Shiga like toxin producing E coli (STEC) NOT DETECTED NOT DETECTED Final   Shigella/Enteroinvasive E coli (EIEC) NOT DETECTED NOT DETECTED Final   Cryptosporidium NOT DETECTED NOT DETECTED Final   Cyclospora cayetanensis NOT DETECTED NOT DETECTED Final   Entamoeba histolytica NOT DETECTED NOT DETECTED Final   Giardia lamblia NOT DETECTED NOT DETECTED Final   Adenovirus F40/41 NOT DETECTED NOT DETECTED Final   Astrovirus NOT DETECTED NOT DETECTED Final   Norovirus GI/GII NOT DETECTED NOT DETECTED Final   Rotavirus A NOT DETECTED NOT DETECTED Final   Sapovirus (I, II, IV, and V) NOT DETECTED NOT DETECTED Final    Comment: Performed at [facility], [place] [place], [place] [address]  Culture, blood (routine x 2)     Status: None   Collection Time: 01/13/19  7:30 PM   Specimen: BLOOD RIGHT ARM  Result Value Ref Range Status   Specimen Description BLOOD RIGHT ARM  Final   Special Requests   Final    BOTTLES DRAWN AEROBIC AND ANAEROBIC Blood Culture results may not be optimal due to an inadequate volume of blood received in culture bottles   Culture   Final    NO GROWTH 5 DAYS Performed at [facility], [address] [place], [place] [address]    Report Status 01/18/2019 FINAL  Final  MRSA PCR Screening     Status: None   Collection Time: 01/14/19  2:01 AM   Specimen: Nasal Mucosa; Nasopharyngeal  Result Value Ref Range Status   MRSA by PCR NEGATIVE NEGATIVE Final    Comment:        The GeneXpert MRSA Assay (FDA approved for NASAL specimens only), is one component of a comprehensive MRSA colonization surveillance program. It is not intended to diagnose MRSA infection nor to guide or monitor treatment for MRSA infections. Performed at [facility], [place] [address] [place] [address], [place],  [address]   Respiratory Panel by PCR     Status: None   Collection Time: 01/14/19  9:14 AM   Specimen: Nasopharyngeal Swab; Respiratory  Result Value  Ref Range Status   Adenovirus NOT DETECTED NOT DETECTED Final   Coronavirus 229E NOT DETECTED NOT DETECTED Final    Comment: (NOTE) The Coronavirus on the Respiratory Panel, DOES NOT test for the novel  Coronavirus (2019 nCoV)    Coronavirus HKU1 NOT DETECTED NOT DETECTED Final   Coronavirus NL63 NOT DETECTED NOT DETECTED Final   Coronavirus OC43 NOT DETECTED NOT DETECTED Final  Metapneumovirus NOT DETECTED NOT DETECTED Final   Rhinovirus / Enterovirus NOT DETECTED NOT DETECTED Final   Influenza A NOT DETECTED NOT DETECTED Final   Influenza B NOT DETECTED NOT DETECTED Final   Parainfluenza Virus 1 NOT DETECTED NOT DETECTED Final   Parainfluenza Virus 2 NOT DETECTED NOT DETECTED Final   Parainfluenza Virus 3 NOT DETECTED NOT DETECTED Final   Parainfluenza Virus 4 NOT DETECTED NOT DETECTED Final   Respiratory Syncytial Virus NOT DETECTED NOT DETECTED Final   Bordetella pertussis NOT DETECTED NOT DETECTED Final   Chlamydophila pneumoniae NOT DETECTED NOT DETECTED Final   Mycoplasma pneumoniae NOT DETECTED NOT DETECTED Final    Comment: Performed at [facility], [place] [address], [place], [place] [address]  Culture, blood (Routine X 2) w Reflex to ID Panel     Status: None   Collection Time: 01/14/19  9:23 PM   Specimen: BLOOD  Result Value Ref Range Status   Specimen Description BLOOD RIGHT ARM  Final   Special Requests   Final    BOTTLES DRAWN AEROBIC AND ANAEROBIC Blood Culture adequate volume   Culture   Final    NO GROWTH 5 DAYS Performed at [facility], [address], [place], [place] [address]    Report Status 01/19/2019 FINAL  Final  Culture, blood (Routine X 2) w Reflex to ID Panel     Status: None   Collection Time: 01/14/19  9:23 PM   Specimen: BLOOD  Result Value Ref Range Status   Specimen Description BLOOD LEFT HAND  Final   Special Requests   Final    BOTTLES DRAWN AEROBIC AND ANAEROBIC Blood Culture adequate volume   Culture   Final    NO GROWTH 5  DAYS Performed at [facility], [place] [address], [place], [place] [address]    Report Status 01/19/2019 FINAL  Final  SARS CORONAVIRUS 2 (TAT 6-24 HRS) Nasopharyngeal Nasopharyngeal Swab     Status: None   Collection Time: 01/16/19  5:20 PM   Specimen: Nasopharyngeal Swab  Result Value Ref Range Status   SARS Coronavirus 2 NEGATIVE NEGATIVE Final    Comment: (NOTE) SARS-CoV-2 target nucleic acids are NOT DETECTED. The SARS-CoV-2 RNA is generally detectable in upper and lower respiratory specimens during the acute phase of infection. Negative results do not preclude SARS-CoV-2 infection, do not rule out co-infections with other pathogens, and should not be used as the sole basis for treatment or other patient management decisions. Negative results must be combined with clinical observations, patient history, and epidemiological information. The expected result is Negative. Fact Sheet for Patients: SugarRoll.be Fact Sheet for Healthcare Providers: https://www.woods-mathews.com/ This test is not yet approved or cleared by the [place] FDA and  has been authorized for detection and/or diagnosis of SARS-CoV-2 by FDA under an Emergency Use Authorization (EUA). This EUA will remain  in effect (meaning this test can be used) for the duration of the COVID-19 declaration under Section 56 4(b)(1) of the Act, 21 U.S.C. section 360bbb-3(b)(1), unless the authorization is terminated or revoked sooner. Performed at [facility], [place] [address] [place], [place] [address]          Radiology Studies: No results found.      Scheduled Meds: . ARIPiprazole  10 mg Oral QHS  . benztropine  1 mg Oral Daily  . Chlorhexidine Gluconate Cloth  6 each Topical Daily  . citalopram  20 mg Oral QHS  . heparin  5,000 Units Subcutaneous Q8H  . influenza vac split quadrivalent PF  0.5 mL Intramuscular Tomorrow-1000  . insulin aspart  1-3  Units Subcutaneous Q4H  . [START ON 01/20/2019] polyethylene glycol-electrolytes  2,000 mL Oral Once  . sodium chloride flush  3 mL Intravenous Q12H  . traZODone  200 mg Oral QHS   Continuous Infusions: . sodium chloride 75 mL/hr at 01/19/19 1053  . sodium chloride 125 mL/hr at 01/16/19 0118     LOS: 6 days    Yaakov Guthrie, MD [facility] Pager on amion  If 7PM-7AM, please contact night-coverage [email or web address] Password [facility] 01/19/2019, 6:06 PM

## 2019-01-19 NOTE — Progress Notes (Signed)
Carl Carey for Infectious Disease   Reason for visit: Follow up on fever, pneumonia  Interval History: WBC wnl at 4.3 from yesterday, fever curve trending down to 100.4, breathing better.  No acute events.   Remains off of antibiotics since 9/16 after completing 5 days azithromycin and ceftriaxone for pneumonia.   Physical Exam: Constitutional:  Vitals:   01/18/19 2200 01/19/19 0500  BP:  112/72  Pulse:  79  Resp:  16  Temp: 100 F (37.8 C) 99.5 F (37.5 C)  SpO2:     patient appears in NAD Respiratory: Normal respiratory effort; CTA B Cardiovascular: RRR GI: soft, nt, nd  Review of Systems: Constitutional: negative for fevers, chills, malaise and anorexia Gastrointestinal: positive for diarrhea Integument/breast: negative for rash  Lab Results  Component Value Date   WBC 4.3 01/18/2019   HGB 9.6 (L) 01/18/2019   HCT 29.1 (L) 01/18/2019   MCV 80.8 01/18/2019   PLT 292 01/18/2019    Lab Results  Component Value Date   CREATININE 0.74 01/18/2019   BUN <5 (L) 01/18/2019   NA 138 01/18/2019   K 4.0 01/18/2019   CL 107 01/18/2019   CO2 24 01/18/2019    Lab Results  Component Value Date   ALT 31 01/17/2019   AST 73 (H) 01/17/2019   ALKPHOS 71 01/17/2019     Microbiology: Recent Results (from the past 240 hour(s))  Culture, blood (routine x 2)     Status: None   Collection Time: 01/13/19  3:27 PM   Specimen: BLOOD RIGHT HAND  Result Value Ref Range Status   Specimen Description BLOOD RIGHT HAND  Final   Special Requests   Final    BOTTLES DRAWN AEROBIC AND ANAEROBIC Blood Culture results may not be optimal due to an inadequate volume of blood received in culture bottles   Culture   Final    NO GROWTH 5 DAYS Performed at [facility], [place] [address] [place], [place] [address]    Report Status 01/18/2019 FINAL  Final  SARS Coronavirus 2 [facility] order, Performed in [facility] hospital lab) Nasopharyngeal Nasopharyngeal Swab     Status: None    Collection Time: 01/13/19  3:37 PM   Specimen: Nasopharyngeal Swab  Result Value Ref Range Status   SARS Coronavirus 2 NEGATIVE NEGATIVE Final    Comment: (NOTE) If result is NEGATIVE SARS-CoV-2 target nucleic acids are NOT DETECTED. The SARS-CoV-2 RNA is generally detectable in upper and lower  respiratory specimens during the acute phase of infection. The lowest  concentration of SARS-CoV-2 viral copies this assay can detect is 250  copies / mL. A negative result does not preclude SARS-CoV-2 infection  and should not be used as the sole basis for treatment or other  patient management decisions.  A negative result may occur with  improper specimen collection / handling, submission of specimen other  than nasopharyngeal swab, presence of viral mutation(s) within the  areas targeted by this assay, and inadequate number of viral copies  (<250 copies / mL). A negative result must be combined with clinical  observations, patient history, and epidemiological information. If result is POSITIVE SARS-CoV-2 target nucleic acids are DETECTED. The SARS-CoV-2 RNA is generally detectable in upper and lower  respiratory specimens dur ing the acute phase of infection.  Positive  results are indicative of active infection with SARS-CoV-2.  Clinical  correlation with patient history and other diagnostic information is  necessary to determine patient infection status.  Positive results do  not rule out bacterial infection or co-infection with other viruses. If result is PRESUMPTIVE POSTIVE SARS-CoV-2 nucleic acids MAY BE PRESENT.   A presumptive positive result was obtained on the submitted specimen  and confirmed on repeat testing.  While 2019 novel coronavirus  (SARS-CoV-2) nucleic acids may be present in the submitted sample  additional confirmatory testing may be necessary for epidemiological  and / or clinical management purposes  to differentiate between  SARS-CoV-2 and other Sarbecovirus  currently known to infect humans.  If clinically indicated additional testing with an alternate test  methodology [phone]) is advised. The SARS-CoV-2 RNA is generally  detectable in upper and lower respiratory sp ecimens during the acute  phase of infection. The expected result is Negative. Fact Sheet for Patients:  StrictlyIdeas.no Fact Sheet for Healthcare Providers: BankingDealers.co.za This test is not yet approved or cleared by the [place] FDA and has been authorized for detection and/or diagnosis of SARS-CoV-2 by FDA under an Emergency Use Authorization (EUA).  This EUA will remain in effect (meaning this test can be used) for the duration of the COVID-19 declaration under Section 564(b)(1) of the Act, 21 U.S.C. section 360bbb-3(b)(1), unless the authorization is terminated or revoked sooner. Performed at [facility], [place] [address] [place], [place] [address]   Urine culture     Status: Abnormal   Collection Time: 01/13/19  4:56 PM   Specimen: In/Out Cath Urine  Result Value Ref Range Status   Specimen Description IN/OUT CATH URINE  Final   Special Requests   Final    NONE Performed at [facility], [place] [address] [place], [place] [address]    Culture (A)  Final    60,000 COLONIES/mL MULTIPLE SPECIES PRESENT, SUGGEST RECOLLECTION   Report Status 01/15/2019 FINAL  Final  C difficile quick scan w PCR reflex     Status: None   Collection Time: 01/13/19  5:35 PM   Specimen: STOOL  Result Value Ref Range Status   C Diff antigen NEGATIVE NEGATIVE Final   C Diff toxin NEGATIVE NEGATIVE Final   C Diff interpretation No C. difficile detected.  Final    Comment: Performed at [facility], [place] [address] [place], [place] [address]  Gastrointestinal Panel by PCR , Stool     Status: None   Collection Time: 01/13/19  5:36 PM   Specimen: STOOL  Result Value Ref Range Status   Campylobacter species NOT  DETECTED NOT DETECTED Final   Plesimonas shigelloides NOT DETECTED NOT DETECTED Final   Salmonella species NOT DETECTED NOT DETECTED Final   Yersinia enterocolitica NOT DETECTED NOT DETECTED Final   Vibrio species NOT DETECTED NOT DETECTED Final   Vibrio cholerae NOT DETECTED NOT DETECTED Final   Enteroaggregative E coli (EAEC) NOT DETECTED NOT DETECTED Final   Enteropathogenic E coli (EPEC) NOT DETECTED NOT DETECTED Final   Enterotoxigenic E coli (ETEC) NOT DETECTED NOT DETECTED Final   Shiga like toxin producing E coli (STEC) NOT DETECTED NOT DETECTED Final   Shigella/Enteroinvasive E coli (EIEC) NOT DETECTED NOT DETECTED Final   Cryptosporidium NOT DETECTED NOT DETECTED Final   Cyclospora cayetanensis NOT DETECTED NOT DETECTED Final   Entamoeba histolytica NOT DETECTED NOT DETECTED Final   Giardia lamblia NOT DETECTED NOT DETECTED Final   Adenovirus F40/41 NOT DETECTED NOT DETECTED Final   Astrovirus NOT DETECTED NOT DETECTED Final   Norovirus GI/GII NOT DETECTED NOT DETECTED Final   Rotavirus A NOT DETECTED  NOT DETECTED Final   Sapovirus (I, II, IV, and V) NOT DETECTED NOT DETECTED Final    Comment: Performed at [facility], [place] [place], [place] [address]  Culture, blood (routine x 2)     Status: None   Collection Time: 01/13/19  7:30 PM   Specimen: BLOOD RIGHT ARM  Result Value Ref Range Status   Specimen Description BLOOD RIGHT ARM  Final   Special Requests   Final    BOTTLES DRAWN AEROBIC AND ANAEROBIC Blood Culture results may not be optimal due to an inadequate volume of blood received in culture bottles   Culture   Final    NO GROWTH 5 DAYS Performed at [facility], [place] [address], [place], [place] [address]    Report Status 01/18/2019 FINAL  Final  MRSA PCR Screening     Status: None   Collection Time: 01/14/19  2:01 AM   Specimen: Nasal Mucosa; Nasopharyngeal  Result Value Ref Range Status   MRSA by PCR NEGATIVE NEGATIVE Final     Comment:        The GeneXpert MRSA Assay (FDA approved for NASAL specimens only), is one component of a comprehensive MRSA colonization surveillance program. It is not intended to diagnose MRSA infection nor to guide or monitor treatment for MRSA infections. Performed at [facility], [place] [address], [place], [place] [address]   Respiratory Panel by PCR     Status: None   Collection Time: 01/14/19  9:14 AM   Specimen: Nasopharyngeal Swab; Respiratory  Result Value Ref Range Status   Adenovirus NOT DETECTED NOT DETECTED Final   Coronavirus 229E NOT DETECTED NOT DETECTED Final    Comment: (NOTE) The Coronavirus on the Respiratory Panel, DOES NOT test for the novel  Coronavirus (2019 nCoV)    Coronavirus HKU1 NOT DETECTED NOT DETECTED Final   Coronavirus NL63 NOT DETECTED NOT DETECTED Final   Coronavirus OC43 NOT DETECTED NOT DETECTED Final   Metapneumovirus NOT DETECTED NOT DETECTED Final   Rhinovirus / Enterovirus NOT DETECTED NOT DETECTED Final   Influenza A NOT DETECTED NOT DETECTED Final   Influenza B NOT DETECTED NOT DETECTED Final   Parainfluenza Virus 1 NOT DETECTED NOT DETECTED Final   Parainfluenza Virus 2 NOT DETECTED NOT DETECTED Final   Parainfluenza Virus 3 NOT DETECTED NOT DETECTED Final   Parainfluenza Virus 4 NOT DETECTED NOT DETECTED Final   Respiratory Syncytial Virus NOT DETECTED NOT DETECTED Final   Bordetella pertussis NOT DETECTED NOT DETECTED Final   Chlamydophila pneumoniae NOT DETECTED NOT DETECTED Final   Mycoplasma pneumoniae NOT DETECTED NOT DETECTED Final    Comment: Performed at [facility], [place]. [address] [place], [place] [address]  Culture, blood (Routine X 2) w Reflex to ID Panel     Status: None (Preliminary result)   Collection Time: 01/14/19  9:23 PM   Specimen: BLOOD  Result Value Ref Range Status   Specimen Description BLOOD RIGHT ARM  Final   Special Requests   Final    BOTTLES DRAWN AEROBIC AND ANAEROBIC Blood Culture  adequate volume   Culture   Final    NO GROWTH 4 DAYS Performed at [facility], [place] [address] [place], [place] [address]    Report Status PENDING  Incomplete  Culture, blood (Routine X 2) w Reflex to ID Panel     Status: None (Preliminary result)   Collection Time: 01/14/19  9:23 PM   Specimen: BLOOD  Result Value Ref Range Status   Specimen Description BLOOD LEFT HAND  Final   Special Requests   Final    BOTTLES DRAWN AEROBIC AND ANAEROBIC Blood Culture adequate volume   Culture   Final    NO GROWTH 4 DAYS Performed at [facility], [address], [place], [place] [address]    Report Status PENDING  Incomplete  SARS CORONAVIRUS 2 (TAT 6-24 HRS) Nasopharyngeal Nasopharyngeal Swab     Status: None   Collection Time: 01/16/19  5:20 PM   Specimen: Nasopharyngeal Swab  Result Value Ref Range Status   SARS Coronavirus 2 NEGATIVE NEGATIVE Final    Comment: (NOTE) SARS-CoV-2 target nucleic acids are NOT DETECTED. The SARS-CoV-2 RNA is generally detectable in upper and lower respiratory specimens during the acute phase of infection. Negative results do not preclude SARS-CoV-2 infection, do not rule out co-infections with other pathogens, and should not be used as the sole basis for treatment or other patient management decisions. Negative results must be combined with clinical observations, patient history, and epidemiological information. The expected result is Negative. Fact Sheet for Patients: SugarRoll.be Fact Sheet for Healthcare Providers: https://www.woods-mathews.com/ This test is not yet approved or cleared by the [place] FDA and  has been authorized for detection and/or diagnosis of SARS-CoV-2 by FDA under an Emergency Use Authorization (EUA). This EUA will remain  in effect (meaning this test can be used) for the duration of the COVID-19 declaration under Section 56 4(b)(1) of the Act, 21 U.S.C. section  360bbb-3(b)(1), unless the authorization is terminated or revoked sooner. Performed at [facility], [place] [address] [place], [place] [address]     Impression/Plan:  1. Fever - improving with normal WBC.  Will continue to observe off of antibiotics.    2. Chronic diarrhea - concerning for inflammatory bowel disease and he has multiple hospitalizations for this.  Certainly this may be partial cause of fever though with increased WBC initially, I most suspect a translocation from his bowel disease.  Will wait for input from GI if endoscopy and/or steroids indicated.    3.  Pneumonia - multifocal in nature and typical for viral etiology.  Bacterial possible and has completed appropriate antibiotics.  He is sating well on nasal cannula and query if this can be weaned off.    Dr. Johnnye Sima available over the weekend if needed, otherwise I will follow up on Monday.

## 2019-01-19 NOTE — Plan of Care (Signed)

## 2019-01-19 NOTE — Progress Notes (Signed)
  Speech Language Pathology Treatment: Dysphagia  Patient Details Name: Carl Carey MRN: [id number] DOB: July 10, 1980 Today's Date: 01/19/2019 Time: 1259-1310 SLP Time Calculation (min) (ACUTE ONLY): 11 min  Assessment / Plan / Recommendation Clinical Impression  Pt was encountered awake/alert sitting upright in bed.  Pt's diet was changed from Dysphagia 2 (fine chop)/Thin liquid diet to a clear liquid diet prior to this tx session secondary to upcoming colonoscopy.  Pt was observed with lunch meal tray of clear liquids including shaved ice, jello, and thin liquid via cup & straw sip.  Pt independently took small bites/sips and he did not exhibit any clinical s/sx of aspiration or difficulty with any trials.  Pt reported continued difficulty masticating solids secondary to dentition.  ST will continue to monitor diet tolerance and evaluate appropriateness for clinical diet upgrade.  Recommend initiating a Dysphagia 2 (fine chop)/Thin liquid diet when pt is medically appropriate to upgrade to a solid diet following upcoming procedure.    HPI HPI: Pt is a 38 yo male presenting with AMS and vomiting, admitted with septic shock and respiratory failure requiring BiPAP. CXR concerning for PNA and possible aspiration. Pt was evaluated by SLP in 2016, initially NPO but then advancing to Dys 2 solids, thin liquids primarily due to mentation. PMH: parkinsonism, panic attack, malnutrition, DM, bipolar 1 d/o      SLP Plan  Continue with current plan of care       Recommendations  Diet recommendations: Dysphagia 2 (fine chop);Thin liquid Liquids provided via: Cup;Straw Medication Administration: Whole meds with liquid Supervision: Intermittent supervision to cue for compensatory strategies Compensations: Small sips/bites;Slow rate;Minimize environmental distractions Postural Changes and/or Swallow Maneuvers: Seated upright 90 degrees                Oral Care Recommendations: Oral care BID Follow  up Recommendations: (TBD) SLP Visit Diagnosis: Dysphagia, oral phase (R13.11) Plan: Continue with current plan of care       Bretta Bang, M.S., [place] Office: [phone]               [place] 01/19/2019, 1:14 PM

## 2019-01-20 LAB — CBC
HCT: 31.3 % — ABNORMAL LOW (ref 39.0–52.0)
Hemoglobin: 9.8 g/dL — ABNORMAL LOW (ref 13.0–17.0)
MCH: 25.5 pg — ABNORMAL LOW (ref 26.0–34.0)
MCHC: 31.3 g/dL (ref 30.0–36.0)
MCV: 81.3 fL (ref 80.0–100.0)
Platelets: 366 10*3/uL (ref 150–400)
RBC: 3.85 MIL/uL — ABNORMAL LOW (ref 4.22–5.81)
RDW: 14 % (ref 11.5–15.5)
WBC: 7.4 10*3/uL (ref 4.0–10.5)
nRBC: 0 % (ref 0.0–0.2)

## 2019-01-20 LAB — GLUCOSE, CAPILLARY
Glucose-Capillary: 78 mg/dL (ref 70–99)
Glucose-Capillary: 73 mg/dL (ref 70–99)
Glucose-Capillary: 78 mg/dL (ref 70–99)
Glucose-Capillary: 93 mg/dL (ref 70–99)

## 2019-01-20 LAB — BASIC METABOLIC PANEL
Anion gap: 9 (ref 5–15)
BUN: <5 mg/dL — ABNORMAL LOW (ref 6–20)
CO2: 22 mmol/L (ref 22–32)
Calcium: 7.7 mg/dL — ABNORMAL LOW (ref 8.9–10.3)
Chloride: 102 mmol/L (ref 98–111)
Creatinine, Ser: 0.71 mg/dL (ref 0.61–1.24)
GFR calc Af Amer: >60 mL/min (ref >60)
GFR calc non Af Amer: >60 mL/min (ref >60)
Glucose, Bld: 92 mg/dL (ref 70–99)
Potassium: 3.7 mmol/L (ref 3.5–5.1)
Sodium: 133 mmol/L — ABNORMAL LOW (ref 135–145)

## 2019-01-20 LAB — HIGH SENSITIVITY CRP: CRP, High Sensitivity: 70.7 mg/L — ABNORMAL HIGH (ref 0.00–3.00)

## 2019-01-20 MED ORDER — PEG 3350-KCL-NA BICARB-NACL 420 G PO SOLR
4000.0000 mL | Freq: Once | ORAL | Status: DC
  Filled 2019-01-20: qty 4000

## 2019-01-20 NOTE — Progress Notes (Signed)
Physical Therapy Treatment Patient Details Name: Carl Carey MRN: [id number] DOB: [birth year] Today's Date: 01/20/2019    History of Present Illness 38 year old male with history of bipolar disorder presented on 01/13/2019 with shortness of breath and respiratory distress along with altered mental status.  He was apparently found by family, lethargic but vomiting.  Patient had recent admission to [facility] for gastroenteritis and hyponatremia.  Admitted for CAP.    PT Comments    Pt amb with IV pole today rather than the RW.  He continues to make progress.  Do not feel he will need any PT at time of d/c.   Follow Up Recommendations  Supervision - Intermittent;No PT follow up     Equipment Recommendations  None recommended by PT    Recommendations for Other Services       Precautions / Restrictions      Mobility  Bed Mobility Overal bed mobility: Independent                Transfers Overall transfer level: Modified independent Equipment used: None Transfers: Sit to/from Stand Sit to Stand: Modified independent (Device/Increase time)            Ambulation/Gait Ambulation/Gait assistance: Supervision Gait Distance (Feet): 550 Feet Assistive device: IV Pole Gait Pattern/deviations: Step-through pattern;Decreased stride length Gait velocity: WFL   General Gait Details: Amb in room without IV pole. He felt he needed support of IV pole.  He ran into several obstacles on the L with the IV pole   Stairs             Wheelchair Mobility    Modified Rankin (Stroke Patients Only)       Balance           Standing balance support: Single extremity supported;No upper extremity supported Standing balance-Leahy Scale: Good Standing balance comment: prefers UE support for ambulation right now                            Cognition Arousal/Alertness: Awake/alert Behavior During Therapy: WFL for tasks assessed/performed Overall Cognitive  Status: No family/caregiver present to determine baseline cognitive functioning                                 General Comments: alert and oriented, some, likely baseline, cognitive challenges, possibly poor education      Exercises      General Comments        Pertinent Vitals/Pain Pain Assessment: Faces Faces Pain Scale: No hurt    Home Living                      Prior Function            PT Goals (current goals can now be found in the care plan section) Acute Rehab PT Goals PT Goal Formulation: With patient Time For Goal Achievement: 01/30/19 Potential to Achieve Goals: Good Progress towards PT goals: Progressing toward goals    Frequency    Min 3X/week      PT Plan Current plan remains appropriate    Co-evaluation              AM-PAC PT "6 Clicks" Mobility   Outcome Measure  Help needed turning from your back to your side while in a flat bed without using bedrails?: None Help needed moving from lying on your back to  sitting on the side of a flat bed without using bedrails?: None Help needed moving to and from a bed to a chair (including a wheelchair)?: None Help needed standing up from a chair using your arms (e.g., wheelchair or bedside chair)?: None Help needed to walk in hospital room?: A Little Help needed climbing 3-5 steps with a railing? : A Little 6 Click Score: 22    End of Session Equipment Utilized During Treatment: Gait belt Activity Tolerance: Patient tolerated treatment well Patient left: in bed;with call bell/phone within reach   PT Visit Diagnosis: Other abnormalities of gait and mobility (R26.89);Other symptoms and signs involving the nervous system (R29.898)     Time: 0950-1010 PT Time Calculation (min) (ACUTE ONLY): 20 min  Charges:  $Gait Training: 8-22 mins                     Carl Carey, [place] Pager [phone] 01/20/2019    Carl Carey 01/20/2019, 1:35 PM

## 2019-01-20 NOTE — Progress Notes (Signed)
Patient Glucose levels are in 70's and patient will be NPO at [facility] for EGD/Colonoscopy tomorrow. MD  Made aware. No new orders.

## 2019-01-20 NOTE — Progress Notes (Signed)
PROGRESS NOTE    Carl Carey  FIE:[id number] DOB: 1980/07/20 DOA: 01/13/2019 PCP: Patient, No Pcp Per    Brief Narrative:  38 year old male with history of bipolar disorder presented on 01/13/2019 with shortness of breath and respiratory distress along with altered mental status. He was apparently found by family, lethargic but vomiting. Patient had recent admission to [facility] for gastroenteritis and hyponatremia and reportedly had enteritis on CT scan at that time with negative stool culture/stool for C. difficile and GI PCR. On presentation, he was found to have elevated lactic with leukocytosis. He was started on IV fluids and broad-spectrum antibiotics for possible pneumonia. Now off antibiotics but still having fevers.  01/20/2019: Fever has resolved.  For EGD and colonoscopy tomorrow.  Apparently, EGD and colonoscopy were scheduled to take place today but canceled due to poor bowel preparation.    Assessment & Plan:   Principal Problem:   Bipolar disorder, in partial remission, most recent episode manic ([place]) Active Problems:   Septic shock (HCC)  Sepsis: Present on admission Community-acquired bacterial pneumonia Leukocytosis: -Likely secondary to the sepsis. WBC count improved. Acute hypoxic respiratory failure: RequiredBiPAP initially -Completed Rocephin and Zithromax. -Blood cultures negative.Urine cultures growing 60,000 colonies of multiple species.urine streptococcal antigen negative. Respiratory panel PCR negative. COVID-19 test negative. -Urine Legionella antigennegative. -Still spiking temperatures. Tmax 103.  Consulted infectious disease. -Patient had a recent CAT scan of his chest abdomen pelvis on 01/04/2019 at OSH which only showed some nonspecific enteritis. -2D echo showed EF of 50 to 55% with no valvular vegetation visualized. - Repeat CT of the chest abdomen and pelvison 01/16/2019 showed multiple lung opacities bilaterally, predominantly right  lung consistent with multifocal pneumonia. -Also noted was incidental finding of diffuse wall thickening of esophagus, severe wall thickening of terminal ileum concerning for inflammation.He says he has diarrhea. - Consulted GI. 01/20/2019: Fever has resolved.  Patient does not look septic.  History of bipolar disorder -Patient is on multiple medications.Medications have been restarted as per psychiatry recommendations.-Psychiatry consulted. Continue medications per psychiatry recommendation. Follow-up outpatient.  Abdominal pain/diarrhea -Negative work-up so far including stool for C. difficile/GI PCR and cultures. Had recent CT which showed multiple lung opacities as mentioned above, diffuse wall thickening of the esophagus, severe wall thickening of the terminal ileum.  Concern for inflammatory bowel disease. -Consulted gastroenterology. 01/20/2019: EGD and colonoscopy initially planned for today were canceled due to poor bowel preparation.  EGD and colonoscopy have been rescheduled to take place tomorrow.  Hypokalemia -Potassium improved. 01/20/2019: Potassium was 3.7 today.  Hypophosphatemia -Continue to monitor and replete.    Hypoalbuminemia -Nutrition consult 01/20/2019: Low albumin may be of prognostic significance.  DVT prophylaxis:Heparin Code Status:Full Family Communication: Disposition Plan:Plan to discharge home once symptoms improve  Consultants:PCCM.Psychiatry  Infectious disease, gastroenterology   Procedures:  None  Antimicrobials:  Azithromycin, ceftriaxone(9/12- 9/16)   Subjective: No new complaints No diarrhea Patient is a poor historian Fever has resolved.  Objective: Vitals:   01/19/19 1409 01/19/19 2136 01/20/19 0427 01/20/19 0556  BP: 110/71 116/69  115/62  Pulse: 62 76  85  Resp: 15 16  16   Temp: 98.6 F (37 C) 98.2 F (36.8 C)  98.7 F (37.1 C)  TempSrc: Oral Oral    SpO2: 100% 100%  94%  Weight:   59.7  kg   Height:        Intake/Output Summary (Last 24 hours) at 01/20/2019 1002 Last data filed at 01/20/2019 0852 Gross per 24 hour  Intake 360 ml  Output 1736 ml  Net -1376 ml   Filed Weights   01/13/19 1400 01/20/19 0427  Weight: 65 kg 59.7 kg    Examination:  General exam: Patient is lying in any distress. Respiratory system: Clear to auscultation.   Cardiovascular system: S1 & S2. No murmur. Gastrointestinal system: Abdomen is nondistended, soft and nontender. Normal bowel sounds heard. Central nervous system: Alert and oriented.  Patient moves all extremities.   Extremities: No leg edema.  Data Reviewed: I have personally reviewed following labs and imaging studies  CBC: Recent Labs  Lab 01/13/19 1509  01/15/19 0358 01/16/19 0015 01/17/19 0249 01/18/19 0213 01/20/19 0229  WBC 21.4*   < > 24.2* 11.7* 7.0 4.3 7.4  NEUTROABS 17.9*  --  23.0* 10.8* 6.1 3.1  --   HGB 13.9   < > 10.2* 10.0* 9.3* 9.6* 9.8*  HCT 44.6   < > 30.5* 29.8* 27.7* 29.1* 31.3*  MCV 84.5   < > 79.6* 80.8 79.4* 80.8 81.3  PLT 667*   < > 351 294 279 292 366   < > = values in this interval not displayed.   Basic Metabolic Panel: Recent Labs  Lab 01/14/19 0511 01/15/19 0358 01/16/19 0015 01/17/19 0249 01/18/19 0213 01/20/19 0229  NA 137 135 136 136 138 133*  K 3.2* 3.2* 3.8 3.3* 4.0 3.7  CL 109 106 110 105 107 102  CO2 21* 22 19* 23 24 22   GLUCOSE 110* 137* 131* 105* 105* 92  BUN <5* <5* 7 <5* <5* <5*  CREATININE 0.86 0.70 0.81 0.78 0.74 0.71  CALCIUM 7.7* 7.2* 7.5* 7.5* 7.6* 7.7*  MG 1.6* 1.7 1.7 1.6* 2.0  --   PHOS 2.9 1.4* 1.8* 2.4* 2.7  --    GFR: Estimated Creatinine Clearance: 107.8 mL/min (by C-G formula based on SCr of 0.71 mg/dL). Liver Function Tests: Recent Labs  Lab 01/13/19 1509 01/14/19 0511 01/15/19 0358 01/16/19 0015 01/17/19 0249  AST 119* 50* 53* 56* 73*  ALT 42 36 28 27 31   ALKPHOS 111 70 58 60 71  BILITOT 0.7 1.1 0.7 0.6 0.5  PROT 6.9 4.9* 4.5* 4.8* 4.6*   ALBUMIN 3.2* 2.2* 1.9* 1.8* 1.7*   Recent Labs  Lab 01/13/19 1721  LIPASE 50   No results for input(s): AMMONIA in the last 168 hours. Coagulation Profile: Recent Labs  Lab 01/13/19 1509 01/14/19 0511 01/15/19 0358  INR 1.1 1.3* 1.4*   Cardiac Enzymes: No results for input(s): CKTOTAL, CKMB, CKMBINDEX, TROPONINI in the last 168 hours. BNP (last 3 results) No results for input(s): PROBNP in the last 8760 hours. HbA1C: No results for input(s): HGBA1C in the last 72 hours. CBG: Recent Labs  Lab 01/19/19 0846 01/19/19 1234 01/19/19 1701 01/19/19 2139 01/20/19 0746  GLUCAP 103* 93 96 91 78   Lipid Profile: No results for input(s): CHOL, HDL, LDLCALC, TRIG, CHOLHDL, LDLDIRECT in the last 72 hours. Thyroid Function Tests: No results for input(s): TSH, T4TOTAL, FREET4, T3FREE, THYROIDAB in the last 72 hours. Anemia Panel: No results for input(s): VITAMINB12, FOLATE, FERRITIN, TIBC, IRON, RETICCTPCT in the last 72 hours. Sepsis Labs: Recent Labs  Lab 01/13/19 1721 01/13/19 1942 01/13/19 2259 01/14/19 0511 01/15/19 0358 01/16/19 0015  PROCALCITON 0.10  --   --  4.38 3.34 3.30  LATICACIDVEN 7.0* 3.9* 1.9  --  1.2 0.9    Recent Results (from the past 240 hour(s))  Culture, blood (routine x 2)     Status: None   Collection Time: 01/13/19  3:27 PM   Specimen: BLOOD RIGHT HAND  Result Value Ref Range Status   Specimen Description BLOOD RIGHT HAND  Final   Special Requests   Final    BOTTLES DRAWN AEROBIC AND ANAEROBIC Blood Culture results may not be optimal due to an inadequate volume of blood received in culture bottles   Culture   Final    NO GROWTH 5 DAYS Performed at [facility], [place] [address] [place], [place] [address]    Report Status 01/18/2019 FINAL  Final  SARS Coronavirus 2 [facility] order, Performed in [facility] hospital lab) Nasopharyngeal Nasopharyngeal Swab     Status: None   Collection Time: 01/13/19  3:37 PM   Specimen: Nasopharyngeal  Swab  Result Value Ref Range Status   SARS Coronavirus 2 NEGATIVE NEGATIVE Final    Comment: (NOTE) If result is NEGATIVE SARS-CoV-2 target nucleic acids are NOT DETECTED. The SARS-CoV-2 RNA is generally detectable in upper and lower  respiratory specimens during the acute phase of infection. The lowest  concentration of SARS-CoV-2 viral copies this assay can detect is 250  copies / mL. A negative result does not preclude SARS-CoV-2 infection  and should not be used as the sole basis for treatment or other  patient management decisions.  A negative result may occur with  improper specimen collection / handling, submission of specimen other  than nasopharyngeal swab, presence of viral mutation(s) within the  areas targeted by this assay, and inadequate number of viral copies  (<250 copies / mL). A negative result must be combined with clinical  observations, patient history, and epidemiological information. If result is POSITIVE SARS-CoV-2 target nucleic acids are DETECTED. The SARS-CoV-2 RNA is generally detectable in upper and lower  respiratory specimens dur ing the acute phase of infection.  Positive  results are indicative of active infection with SARS-CoV-2.  Clinical  correlation with patient history and other diagnostic information is  necessary to determine patient infection status.  Positive results do  not rule out bacterial infection or co-infection with other viruses. If result is PRESUMPTIVE POSTIVE SARS-CoV-2 nucleic acids MAY BE PRESENT.   A presumptive positive result was obtained on the submitted specimen  and confirmed on repeat testing.  While 2019 novel coronavirus  (SARS-CoV-2) nucleic acids may be present in the submitted sample  additional confirmatory testing may be necessary for epidemiological  and / or clinical management purposes  to differentiate between  SARS-CoV-2 and other Sarbecovirus currently known to infect humans.  If clinically indicated  additional testing with an alternate test  methodology [phone]) is advised. The SARS-CoV-2 RNA is generally  detectable in upper and lower respiratory sp ecimens during the acute  phase of infection. The expected result is Negative. Fact Sheet for Patients:  StrictlyIdeas.no Fact Sheet for Healthcare Providers: BankingDealers.co.za This test is not yet approved or cleared by the [place] FDA and has been authorized for detection and/or diagnosis of SARS-CoV-2 by FDA under an Emergency Use Authorization (EUA).  This EUA will remain in effect (meaning this test can be used) for the duration of the COVID-19 declaration under Section 564(b)(1) of the Act, 21 U.S.C. section 360bbb-3(b)(1), unless the authorization is terminated or revoked sooner. Performed at [facility], [place] [address], [place], [place] [address]   Urine culture     Status: Abnormal   Collection Time: 01/13/19  4:56 PM   Specimen: In/Out Cath Urine  Result Value Ref Range Status   Specimen Description IN/OUT CATH  URINE  Final   Special Requests   Final    NONE Performed at [facility], [place] [address], [place], [place] [address]    Culture (A)  Final    60,000 COLONIES/mL MULTIPLE SPECIES PRESENT, SUGGEST RECOLLECTION   Report Status 01/15/2019 FINAL  Final  C difficile quick scan w PCR reflex     Status: None   Collection Time: 01/13/19  5:35 PM   Specimen: STOOL  Result Value Ref Range Status   C Diff antigen NEGATIVE NEGATIVE Final   C Diff toxin NEGATIVE NEGATIVE Final   C Diff interpretation No C. difficile detected.  Final    Comment: Performed at [facility], [place] [address], [place], [place] [address]  Gastrointestinal Panel by PCR , Stool     Status: None   Collection Time: 01/13/19  5:36 PM   Specimen: STOOL  Result Value Ref Range Status   Campylobacter species NOT DETECTED NOT DETECTED Final   Plesimonas shigelloides NOT  DETECTED NOT DETECTED Final   Salmonella species NOT DETECTED NOT DETECTED Final   Yersinia enterocolitica NOT DETECTED NOT DETECTED Final   Vibrio species NOT DETECTED NOT DETECTED Final   Vibrio cholerae NOT DETECTED NOT DETECTED Final   Enteroaggregative E coli (EAEC) NOT DETECTED NOT DETECTED Final   Enteropathogenic E coli (EPEC) NOT DETECTED NOT DETECTED Final   Enterotoxigenic E coli (ETEC) NOT DETECTED NOT DETECTED Final   Shiga like toxin producing E coli (STEC) NOT DETECTED NOT DETECTED Final   Shigella/Enteroinvasive E coli (EIEC) NOT DETECTED NOT DETECTED Final   Cryptosporidium NOT DETECTED NOT DETECTED Final   Cyclospora cayetanensis NOT DETECTED NOT DETECTED Final   Entamoeba histolytica NOT DETECTED NOT DETECTED Final   Giardia lamblia NOT DETECTED NOT DETECTED Final   Adenovirus F40/41 NOT DETECTED NOT DETECTED Final   Astrovirus NOT DETECTED NOT DETECTED Final   Norovirus GI/GII NOT DETECTED NOT DETECTED Final   Rotavirus A NOT DETECTED NOT DETECTED Final   Sapovirus (I, II, IV, and V) NOT DETECTED NOT DETECTED Final    Comment: Performed at [facility], [place] [place], [place] [address]  Culture, blood (routine x 2)     Status: None   Collection Time: 01/13/19  7:30 PM   Specimen: BLOOD RIGHT ARM  Result Value Ref Range Status   Specimen Description BLOOD RIGHT ARM  Final   Special Requests   Final    BOTTLES DRAWN AEROBIC AND ANAEROBIC Blood Culture results may not be optimal due to an inadequate volume of blood received in culture bottles   Culture   Final    NO GROWTH 5 DAYS Performed at [facility], [address], [place], [place] [address]    Report Status 01/18/2019 FINAL  Final  MRSA PCR Screening     Status: None   Collection Time: 01/14/19  2:01 AM   Specimen: Nasal Mucosa; Nasopharyngeal  Result Value Ref Range Status   MRSA by PCR NEGATIVE NEGATIVE Final    Comment:        The GeneXpert MRSA Assay (FDA approved for NASAL  specimens only), is one component of a comprehensive MRSA colonization surveillance program. It is not intended to diagnose MRSA infection nor to guide or monitor treatment for MRSA infections. Performed at [facility], [place] [address] [place],  [address]   Respiratory Panel by PCR     Status: None   Collection Time: 01/14/19  9:14 AM  Specimen: Nasopharyngeal Swab; Respiratory  Result Value Ref Range Status   Adenovirus NOT DETECTED NOT DETECTED Final   Coronavirus 229E NOT DETECTED NOT DETECTED Final    Comment: (NOTE) The Coronavirus on the Respiratory Panel, DOES NOT test for the novel  Coronavirus (2019 nCoV)    Coronavirus HKU1 NOT DETECTED NOT DETECTED Final   Coronavirus NL63 NOT DETECTED NOT DETECTED Final   Coronavirus OC43 NOT DETECTED NOT DETECTED Final   Metapneumovirus NOT DETECTED NOT DETECTED Final   Rhinovirus / Enterovirus NOT DETECTED NOT DETECTED Final   Influenza A NOT DETECTED NOT DETECTED Final   Influenza B NOT DETECTED NOT DETECTED Final   Parainfluenza Virus 1 NOT DETECTED NOT DETECTED Final   Parainfluenza Virus 2 NOT DETECTED NOT DETECTED Final   Parainfluenza Virus 3 NOT DETECTED NOT DETECTED Final   Parainfluenza Virus 4 NOT DETECTED NOT DETECTED Final   Respiratory Syncytial Virus NOT DETECTED NOT DETECTED Final   Bordetella pertussis NOT DETECTED NOT DETECTED Final   Chlamydophila pneumoniae NOT DETECTED NOT DETECTED Final   Mycoplasma pneumoniae NOT DETECTED NOT DETECTED Final    Comment: Performed at [facility], [place]. [address] [place], [place] [address]  Culture, blood (Routine X 2) w Reflex to ID Panel     Status: None   Collection Time: 01/14/19  9:23 PM   Specimen: BLOOD  Result Value Ref Range Status   Specimen Description BLOOD RIGHT ARM  Final   Special Requests   Final    BOTTLES DRAWN AEROBIC AND ANAEROBIC Blood Culture adequate volume   Culture   Final    NO GROWTH 5 DAYS Performed at [place], [address], [place], [place] [address]    Report Status 01/19/2019 FINAL  Final  Culture, blood (Routine X 2) w Reflex to ID Panel     Status: None   Collection Time: 01/14/19  9:23 PM   Specimen: BLOOD  Result Value Ref Range Status   Specimen Description BLOOD LEFT HAND  Final   Special Requests   Final    BOTTLES DRAWN AEROBIC AND ANAEROBIC Blood Culture adequate volume   Culture   Final    NO GROWTH 5 DAYS Performed at [facility], [place] [address] [place], [place] [address]    Report Status 01/19/2019 FINAL  Final  SARS CORONAVIRUS 2 (TAT 6-24 HRS) Nasopharyngeal Nasopharyngeal Swab     Status: None   Collection Time: 01/16/19  5:20 PM   Specimen: Nasopharyngeal Swab  Result Value Ref Range Status   SARS Coronavirus 2 NEGATIVE NEGATIVE Final    Comment: (NOTE) SARS-CoV-2 target nucleic acids are NOT DETECTED. The SARS-CoV-2 RNA is generally detectable in upper and lower respiratory specimens during the acute phase of infection. Negative results do not preclude SARS-CoV-2 infection, do not rule out co-infections with other pathogens, and should not be used as the sole basis for treatment or other patient management decisions. Negative results must be combined with clinical observations, patient history, and epidemiological information. The expected result is Negative. Fact Sheet for Patients: SugarRoll.be Fact Sheet for Healthcare Providers: https://www.woods-mathews.com/ This test is not yet approved or cleared by the [place] FDA and  has been authorized for detection and/or diagnosis of SARS-CoV-2 by FDA under an Emergency Use Authorization (EUA). This EUA will remain  in effect (meaning this test can be used) for the duration of the COVID-19 declaration under Section 56 4(b)(1) of the Act, 21 U.S.C. section 360bbb-3(b)(1), unless  the authorization is terminated or revoked sooner. Performed at [facility], [place] [address] [place], [place] [address]          Radiology Studies: No results found.      Scheduled Meds: . ARIPiprazole  10 mg Oral QHS  . benztropine  1 mg Oral Daily  . Chlorhexidine Gluconate Cloth  6 each Topical Daily  . citalopram  20 mg Oral QHS  . heparin  5,000 Units Subcutaneous Q8H  . influenza vac split quadrivalent PF  0.5 mL Intramuscular Tomorrow-1000  . insulin aspart  0-5 Units Subcutaneous QHS  . insulin aspart  0-9 Units Subcutaneous TID WC  . polyethylene glycol-electrolytes  2,000 mL Oral Once  . polyethylene glycol-electrolytes  4,000 mL Oral Once  . sodium chloride flush  3 mL Intravenous Q12H  . traZODone  200 mg Oral QHS   Continuous Infusions: . sodium chloride 75 mL/hr at 01/19/19 2327  . sodium chloride 125 mL/hr at 01/16/19 0118     LOS: 7 days    Bonnell Public, MD [facility] Pager on [place]  If 7PM-7AM, please contact night-coverage [email or web address] Password [email or web address] 01/20/2019, 10:02 AM

## 2019-01-20 NOTE — Progress Notes (Signed)
[facility] Gastroenterology Progress Note  Carl Carey 38 y.o. Dec 03, 1980  CC: Diarrhea, abnormal CT scan, esophageal and terminal ileum thickening   Subjective: Patient was scheduled for EGD and colonoscopy today but he did not completed his prep.  Diarrhea has slowed down.  Afebrile this morning.  Denies any blood in the stool or black stool.  ROS : Negative for chest pain and shortness of breath.   Objective: Vital signs in last 24 hours: Vitals:   01/19/19 2136 01/20/19 0556  BP: 116/69 115/62  Pulse: 76 85  Resp: 16 16  Temp: 98.2 F (36.8 C) 98.7 F (37.1 C)  SpO2: 100% 94%    Physical Exam:  General:  Alert, cooperative, no distress, appears stated age  Head:  Normocephalic, without obvious abnormality, atraumatic  Eyes:  , EOM's intact,   Lungs:     Heart:  Regular rate and rhythm, S1, S2 normal  Abdomen:   Soft, non-tender, bowel sounds active all four quadrants,  no masses,   Extremities: Extremities normal, atraumatic, no  edema       Lab Results: Recent Labs    01/18/19 0213 01/20/19 0229  NA 138 133*  K 4.0 3.7  CL 107 102  CO2 24 22  GLUCOSE 105* 92  BUN <5* <5*  CREATININE 0.74 0.71  CALCIUM 7.6* 7.7*  MG 2.0  --   PHOS 2.7  --    No results for input(s): AST, ALT, ALKPHOS, BILITOT, PROT, ALBUMIN in the last 72 hours. Recent Labs    01/18/19 0213 01/20/19 0229  WBC 4.3 7.4  NEUTROABS 3.1  --   HGB 9.6* 9.8*  HCT 29.1* 31.3*  MCV 80.8 81.3  PLT 292 366   No results for input(s): LABPROT, INR in the last 72 hours.    Assessment/Plan: -Abnormal CT scan concerning for severe esophageal and terminal ileal wall thickening. -Acute on chronic diarrhea.  Stool studies negative. -Febrile illness  Recommendations ----------------------- -Patient did not completed his prep today.  Importance and need for EGD and colonoscopy discussed with the patient in detail today.  He is willing to start prep again today.  We will reschedule his procedures  for tomorrow.  Okay to have clear liquid diet today.  N.p.o. past midnight  Risks (bleeding, infection, bowel perforation that could require surgery, sedation-related changes in cardiopulmonary systems), benefits (identification and possible treatment of source of symptoms, exclusion of certain causes of symptoms), and alternatives (watchful waiting, radiographic imaging studies, empiric medical treatment)  were explained to patient in detail and patient wishes to proceed.   Otis Brace MD, [place] 01/20/2019, 8:02 AM  Contact #  [phone]

## 2019-01-20 NOTE — Progress Notes (Addendum)
Pt stated that he will not be drinking any more of the polyethylene glycol-electrolytes solution. Pt was educated, pt stated that the procedure  has not been fully explained to him, therefore his consent as not been signed. It is unknown what the pt BM looked like.   GI was paged at [phone] and [phone]. Spoke with and updated Dr. Marlou Porch. Dr. stated that he will come by and see the pt this morning.

## 2019-01-21 ENCOUNTER — Inpatient Hospital Stay (HOSPITAL_COMMUNITY): Payer: Medicaid Other | Admitting: Certified Registered Nurse Anesthetist

## 2019-01-21 ENCOUNTER — Encounter (HOSPITAL_COMMUNITY)
Admission: EM | Disposition: A | Discharge status: Home / Self Care | Payer: Self-pay | Source: Home / Self Care | Attending: Internal Medicine

## 2019-01-21 ENCOUNTER — Encounter (HOSPITAL_COMMUNITY): Payer: Self-pay | Admitting: *Deleted

## 2019-01-21 ENCOUNTER — Inpatient Hospital Stay (HOSPITAL_COMMUNITY): Payer: Medicaid Other

## 2019-01-21 HISTORY — PX: ESOPHAGOGASTRODUODENOSCOPY (EGD) WITH PROPOFOL: SHX5813

## 2019-01-21 HISTORY — PX: BIOPSY: SHX5522

## 2019-01-21 HISTORY — PX: COLONOSCOPY WITH PROPOFOL: SHX5780

## 2019-01-21 LAB — GLUCOSE, CAPILLARY
Glucose-Capillary: 86 mg/dL (ref 70–99)
Glucose-Capillary: 87 mg/dL (ref 70–99)
Glucose-Capillary: 99 mg/dL (ref 70–99)
Glucose-Capillary: 76 mg/dL (ref 70–99)
Glucose-Capillary: 99 mg/dL (ref 70–99)
Glucose-Capillary: 102 mg/dL — ABNORMAL HIGH (ref 70–99)
Glucose-Capillary: 102 mg/dL — ABNORMAL HIGH (ref 70–99)

## 2019-01-21 SURGERY — ESOPHAGOGASTRODUODENOSCOPY (EGD) WITH PROPOFOL
Anesthesia: Monitor Anesthesia Care

## 2019-01-21 MED ORDER — EPHEDRINE SULFATE 50 MG/ML IJ SOLN
INTRAMUSCULAR | Status: DC | PRN
  Administered 2019-01-21: 10 mg via INTRAVENOUS

## 2019-01-21 MED ORDER — PROPOFOL 500 MG/50ML IV EMUL
INTRAVENOUS | Status: DC | PRN
  Administered 2019-01-21: 120 ug/kg/min via INTRAVENOUS

## 2019-01-21 MED ORDER — PROPOFOL 10 MG/ML IV BOLUS
INTRAVENOUS | Status: DC | PRN
  Administered 2019-01-21 (×5): 20 mg via INTRAVENOUS

## 2019-01-21 MED ORDER — IOHEXOL 300 MG/ML  SOLN
100.0000 mL | Freq: Once | INTRAMUSCULAR | Status: AC | PRN
  Administered 2019-01-21: 14:00:00 100 mL via INTRAVENOUS

## 2019-01-21 MED ORDER — PHENYLEPHRINE 40 MCG/ML (10ML) SYRINGE FOR IV PUSH (FOR BLOOD PRESSURE SUPPORT)
PREFILLED_SYRINGE | INTRAVENOUS | Status: DC | PRN
  Administered 2019-01-21 (×2): 40 ug via INTRAVENOUS
  Administered 2019-01-21: 80 ug via INTRAVENOUS
  Administered 2019-01-21: 40 ug via INTRAVENOUS
  Administered 2019-01-21: 80 ug via INTRAVENOUS

## 2019-01-21 MED ORDER — SODIUM CHLORIDE 0.9 % IV SOLN: INTRAVENOUS | Status: DC

## 2019-01-21 SURGICAL SUPPLY — 24 items

## 2019-01-21 NOTE — Op Note (Addendum)
[facility] Patient Name: Carl Carey Procedure Date : 01/21/2019 MRN: [id number] Attending MD: Otis Brace , MD Date of Birth: [birth year] CSN: [id number] Age: 38 Admit Type: Inpatient Procedure:                Colonoscopy Indications:              Abnormal CT of the GI tract, terminal ileal                            thickening Providers:                Otis Brace, MD, Grace Isaac, RN, Laverda Sorenson, Technician, Clearnce Sorrel, CRNA Referring MD:              Medicines:                Sedation Administered by an Anesthesia Professional Complications:            No immediate complications. Estimated Blood Loss:     Estimated blood loss was minimal. Procedure:                Pre-Anesthesia Assessment:                           - Prior to the procedure, a History and Physical                            was performed, and patient medications and                            allergies were reviewed. The patient's tolerance of                            previous anesthesia was also reviewed. The risks                            and benefits of the procedure and the sedation                            options and risks were discussed with the patient.                            All questions were answered, and informed consent                            was obtained. Prior Anticoagulants: The patient has                            taken no previous anticoagulant or antiplatelet                            agents. ASA Grade Assessment: II - A patient with  mild systemic disease. After reviewing the risks                            and benefits, the patient was deemed in                            satisfactory condition to undergo the procedure.                           After obtaining informed consent, the colonoscope                            was passed under direct vision. Throughout the                             procedure, the patient's blood pressure, pulse, and                            oxygen saturations were monitored continuously. The                            [id number]-H190DL ([id number] Olympus pediatric colposcope                            was introduced through the anus and advanced to the                            the cecum, identified by appendiceal orifice and                            ileocecal valve. The colonoscopy was technically                            difficult and complex due to bowel stenosis.                            Successful completion of the procedure was aided by                            changing endoscopes to Ultra slim colonoscope. The                            patient tolerated the procedure well. The quality                            of the bowel preparation was fair. Scope In: 9:38:35 AM Scope Out: 10:26:30 AM Scope Withdrawal Time: 0 hours 45 minutes 6 seconds  Total Procedure Duration: 0 hours 47 minutes 55 seconds  Findings:      The perianal and digital rectal examinations were normal.      The ileocecal valve contained a severe stenosis that was non-traversed.       Attempt was made with pediatric colonoscopy to intubate the terminal       ileum but because of possible terminal ileal stenosis  it was difficult.       Subsequently pediatric colonoscopy was withdrawal and UltraSling       colonoscope was inserted. Again we had difficulty with intubation of TI.       Water immersion technique was also attempted without any success.       Procedure time was prolonged because of multiple attempts to intubate TI.      A 3 mm polyp was found in the proximal ascending colon. The polyp was       removed with a cold biopsy forceps. Resection and retrieval were       complete.      Multiple ulcers were found in the distal rectum. No bleeding was       present. Biopsies were taken with a cold forceps for histology.      Retroflexion in the right colon was  performed. Impression:               - Preparation of the colon was fair.                           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                           - One 3 mm polyp in the proximal ascending colon,                            removed with a cold biopsy forceps. Resected and                            retrieved.                           - Multiple ulcers in the distal rectum. Biopsied. Recommendation:           - Return patient to hospital ward for ongoing care.                           - NPO.                           - Continue present medications.                           - Perform a computed tomographic (CT scan)                            enterography at appointment to be scheduled. Procedure Code(s):        --- Professional ---                           [phone], Colonoscopy, flexible; with biopsy, single                            or multiple Diagnosis Code(s):        --- Professional ---                           L46.503, Other intestinal obstruction unspecified  as to partial versus complete obstruction                           K63.5, Polyp of colon                           K62.6, Ulcer of anus and rectum CPT copyright 2019 [employer]. All rights reserved. The codes documented in this report are preliminary and upon coder review may  be revised to meet current compliance requirements. Otis Brace, MD Otis Brace, MD 01/21/2019 10:45:15 AM Number of Addenda: 0

## 2019-01-21 NOTE — Progress Notes (Signed)
Pt BP is 88/49 [phone], 94/51 [phone], temp 99.1, Spo2 95, Pulse 62. Pt was asleep, Pt states he feels fine. CBG 87 MD made aware

## 2019-01-21 NOTE — Progress Notes (Signed)
[facility] Progress Note  Carl Carey 38 y.o. 03/20/1981  CC: Diarrhea, abnormal CT scan, esophageal and terminal ileum thickening   Subjective: No acute GI issue   Objective: Vital signs in last 24 hours: Vitals:   01/21/19 0617 01/21/19 0902  BP: (!) 94/52 (!) 111/54  Pulse: (!) 59   Resp:  (!) 31  Temp:  99.4 F (37.4 C)  SpO2: 96% 94%    Physical Exam:  General:  Alert, cooperative, no distress, appears stated age  Head:  Normocephalic, without obvious abnormality, atraumatic  Eyes:  , EOM's intact,   Lungs:     Heart:  Regular rate and rhythm, S1, S2 normal  Abdomen:   Soft, non-tender, bowel sounds active all four quadrants,  no masses,   Extremities: Extremities normal, atraumatic, no  edema       Lab Results: Recent Labs    01/20/19 0229  NA 133*  K 3.7  CL 102  CO2 22  GLUCOSE 92  BUN <5*  CREATININE 0.71  CALCIUM 7.7*   No results for input(s): AST, ALT, ALKPHOS, BILITOT, PROT, ALBUMIN in the last 72 hours. Recent Labs    01/20/19 0229  WBC 7.4  HGB 9.8*  HCT 31.3*  MCV 81.3  PLT 366   No results for input(s): LABPROT, INR in the last 72 hours.    Assessment/Plan: -Abnormal CT scan concerning for severe esophageal and terminal ileal wall thickening. -Acute on chronic diarrhea.  Stool studies negative. -Febrile illness  Recommendations ----------------------- -EGD and colonoscopy today.  Procedures discussed in detail again today.  Risks (bleeding, infection, bowel perforation that could require surgery, sedation-related changes in cardiopulmonary systems), benefits (identification and possible treatment of source of symptoms, exclusion of certain causes of symptoms), and alternatives (watchful waiting, radiographic imaging studies, empiric medical treatment)  were explained to patient in detail and patient wishes to proceed.   Otis Brace MD, [place] 01/21/2019, 9:13 AM  Contact #  [phone]

## 2019-01-21 NOTE — Anesthesia Postprocedure Evaluation (Signed)
Anesthesia Post Note  Patient: Carl Carey  Procedure(s) Performed: ESOPHAGOGASTRODUODENOSCOPY (EGD) WITH PROPOFOL (N/A ) COLONOSCOPY WITH PROPOFOL (N/A ) BIOPSY     Patient location during evaluation: Endoscopy Anesthesia Type: MAC Level of consciousness: awake and alert Pain management: pain level controlled Vital Signs Assessment: post-procedure vital signs reviewed and stable Respiratory status: spontaneous breathing, nonlabored ventilation, respiratory function stable and patient connected to nasal cannula oxygen Cardiovascular status: stable and blood pressure returned to baseline Postop Assessment: no apparent nausea or vomiting Anesthetic complications: no    Last Vitals:  Vitals:   01/21/19 1044 01/21/19 1054  BP: (!) 104/52 (!) 142/54  Pulse: 61 (!) 58  Resp: 15 17  Temp:    SpO2: 100% 99%    Last Pain:  Vitals:   01/21/19 1034  TempSrc: Temporal  PainSc: 0-No pain                 Catalina Gravel

## 2019-01-21 NOTE — Brief Op Note (Signed)
01/13/2019 - 01/21/2019  10:51 AM  PATIENT:  Carl Carey  38 y.o. male  PRE-OPERATIVE DIAGNOSIS:  Abnormal CT- esophageal thickening and TI thickening suspicious for Crohns disease  POST-OPERATIVE DIAGNOSIS:  EGD: gastric ulcer, bx taken. esophagitis COLON: ascending polyp removed. unable to reach TI. rectal ulcer, biopsy taken  PROCEDURE:  Procedure(s): ESOPHAGOGASTRODUODENOSCOPY (EGD) WITH PROPOFOL (N/A) COLONOSCOPY WITH PROPOFOL (N/A) BIOPSY  SURGEON:  Surgeon(s) and Role:    * Yaroslav Gombos, MD - Primary  Findings ----------- -EGD showed esophagitis and pyloric/gastric ulcer biopsies taken. -Colonoscopy showed terminal ileal stenosis and scattered rectal ulcers.  Fair prep. - Attempt was made with pediatric colonoscopy to intubate the terminal ileum but because of possible terminal ileal stenosis it was difficult.  Subsequently pediatric colonoscopy was withdrawal and UltraSling colonoscope was inserted.  Again we had difficulty with intubation of TI.  Water immersion technique was also attempted without any success.  Procedure time was prolonged because of multiple attempts to intubate TI.  Recommendations ----------------------- -Check CT enterography -Continue PPI. -Recommend repeat EGD and colonoscopy in 3 to 6 months to document healing of ulcers. -GI will follow  Otis Brace MD, [place] 01/21/2019, 10:53 AM  Contact #  [phone]

## 2019-01-21 NOTE — Anesthesia Preprocedure Evaluation (Addendum)
Anesthesia Evaluation  Patient identified by MRN, date of birth, ID band Patient awake    Reviewed: Allergy & Precautions, NPO status , Patient's Chart, lab work & pertinent test results  Airway Mallampati: II  TM Distance: >3 FB Neck ROM: Full    Dental  (+) Teeth Intact, Dental Advisory Given   Pulmonary Current Smoker and Patient abstained from smoking.,    Pulmonary exam normal breath sounds clear to auscultation       Cardiovascular negative cardio ROS Normal cardiovascular exam Rhythm:Regular Rate:Normal     Neuro/Psych PSYCHIATRIC DISORDERS Anxiety Bipolar Disorder PD negative neurological ROS     GI/Hepatic Neg liver ROS, Abnormal CT- esophageal thickening and TI thickening suspicious for Crohns disease   Endo/Other  diabetes  Renal/GU negative Renal ROS     Musculoskeletal negative musculoskeletal ROS (+)   Abdominal   Peds  Hematology  (+) Blood dyscrasia, anemia ,   Anesthesia Other Findings Day of surgery medications reviewed with the patient.  Reproductive/Obstetrics                            Anesthesia Physical Anesthesia Plan  ASA: II  Anesthesia Plan: MAC   Post-op Pain Management:    Induction: Intravenous  PONV Risk Score and Plan: 0 and Propofol infusion and Treatment may vary due to age or medical condition  Airway Management Planned: Nasal Cannula and Natural Airway  Additional Equipment:   Intra-op Plan:   Post-operative Plan:   Informed Consent: I have reviewed the patients History and Physical, chart, labs and discussed the procedure including the risks, benefits and alternatives for the proposed anesthesia with the patient or authorized representative who has indicated his/her understanding and acceptance.     Dental advisory given  Plan Discussed with: CRNA and Anesthesiologist  Anesthesia Plan Comments:         Anesthesia Quick  Evaluation

## 2019-01-21 NOTE — Transfer of Care (Signed)
Immediate Anesthesia Transfer of Care Note  Patient: Carl Carey  Procedure(s) Performed: ESOPHAGOGASTRODUODENOSCOPY (EGD) WITH PROPOFOL (N/A ) COLONOSCOPY WITH PROPOFOL (N/A ) BIOPSY  Patient Location: Endoscopy Unit  Anesthesia Type:MAC  Level of Consciousness: awake, alert  and oriented  Airway & Oxygen Therapy: Patient Spontanous Breathing and Patient connected to nasal cannula oxygen  Post-op Assessment: Report given to RN and Post -op Vital signs reviewed and stable  Post vital signs: Reviewed and stable  Last Vitals:  Vitals Value Taken Time  BP 113/54 01/21/19 1034  Temp    Pulse 62 01/21/19 1037  Resp 32 01/21/19 1037  SpO2 99 % 01/21/19 1037  Vitals shown include unvalidated device data.  Last Pain:  Vitals:   01/21/19 1034  TempSrc: Temporal  PainSc: 0-No pain         Complications: No apparent anesthesia complications

## 2019-01-21 NOTE — Op Note (Signed)
[facility] Patient Name: Carl Carey Procedure Date : 01/21/2019 MRN: [id number] Attending MD: Otis Brace , MD Date of Birth: [birth year] CSN: [id number] Age: 38 Admit Type: Inpatient Procedure:                Upper GI endoscopy Indications:              Abnormal CT of the GI tract, Suspected esophagitis Providers:                Otis Brace, MD, Grace Isaac, RN, Laverda Sorenson, Technician, Clearnce Sorrel, CRNA Referring MD:              Medicines:                Sedation Administered by an Anesthesia Professional Complications:            No immediate complications. Estimated Blood Loss:     Estimated blood loss was minimal. Procedure:                Pre-Anesthesia Assessment:                           - Prior to the procedure, a History and Physical                            was performed, and patient medications and                            allergies were reviewed. The patient's tolerance of                            previous anesthesia was also reviewed. The risks                            and benefits of the procedure and the sedation                            options and risks were discussed with the patient.                            All questions were answered, and informed consent                            was obtained. Prior Anticoagulants: The patient has                            taken no previous anticoagulant or antiplatelet                            agents. ASA Grade Assessment: II - A patient with                            mild systemic disease. After reviewing the risks  and benefits, the patient was deemed in                            satisfactory condition to undergo the procedure.                           After obtaining informed consent, the endoscope was                            passed under direct vision. Throughout the                            procedure, the patient's  blood pressure, pulse, and                            oxygen saturations were monitored continuously. The                            [id number]-[id number] ([id number] Olympus gastroscope was                            introduced through the mouth, and advanced to the                            second part of duodenum. The upper GI endoscopy was                            because of patients cough. The patient tolerated                            the procedure well. Scope In: Scope Out: Findings:      LA Grade C (one or more mucosal breaks continuous between tops of 2 or       more mucosal folds, less than 75% circumference) esophagitis with no       bleeding was found in the mid esophagus. Biopsies were taken with a cold       forceps for histology.      One non-bleeding superficial gastric ulcer with no stigmata of bleeding       was found at the pylorus. Biopsies were taken with a cold forceps for       histology.      Moderate inflammation characterized by congestion (edema), erosions and       erythema was found in the gastric fundus.      The cardia and gastric fundus were normal on retroflexion.      The duodenal bulb, first portion of the duodenum and second portion of       the duodenum were normal. Impression:               - LA Grade C esophagitis. Biopsied.                           - Non-bleeding gastric ulcer with no stigmata of                            bleeding.  Biopsied.                           - Gastritis.                           - Normal duodenal bulb, first portion of the                            duodenum and second portion of the duodenum. Recommendation:           - Perform a colonoscopy today. Procedure Code(s):        --- Professional ---                           [phone], Esophagogastroduodenoscopy, flexible,                            transoral; with biopsy, single or multiple Diagnosis Code(s):        --- Professional ---                           K20.9, Esophagitis,  unspecified                           K25.9, Gastric ulcer, unspecified as acute or                            chronic, without hemorrhage or perforation                           R93.3, Abnormal findings on diagnostic imaging of                            other parts of digestive tract CPT copyright 2019 [employer]. All rights reserved. The codes documented in this report are preliminary and upon coder review may  be revised to meet current compliance requirements. Otis Brace, MD Otis Brace, MD 01/21/2019 10:32:59 AM Number of Addenda: 0

## 2019-01-21 NOTE — Progress Notes (Signed)
PROGRESS NOTE    Carl Carey  EHO:[id number] DOB: 1981/01/05 DOA: 01/13/2019 PCP: Patient, No Pcp Per    Brief Narrative:  38 year old male with history of bipolar disorder presented on 01/13/2019 with shortness of breath and respiratory distress along with altered mental status. He was apparently found by family, lethargic but vomiting. Patient had recent admission to [facility] for gastroenteritis and hyponatremia and reportedly had enteritis on CT scan at that time with negative stool culture/stool for C. difficile and GI PCR. On presentation, he was found to have elevated lactic with leukocytosis. He was started on IV fluids and broad-spectrum antibiotics for possible pneumonia. Now off antibiotics but still having fevers.  01/20/2019: Fever has resolved.  For EGD and colonoscopy tomorrow.  Apparently, EGD and colonoscopy were scheduled to take place today but canceled due to poor bowel preparation.  01/21/2019: Patient underwent EGD and colonoscopy today.  EGD revealed esophagitis, gastric and pyloric ulcers.  Colonoscopy revealed terminal ileal stenosis and scattered rectal ulcers.  GI has recommended CT enterography.  Patient has remained to be afebrile.  Likely DC after work-up is completed.  Physical therapy input is appreciated.  Assessment & Plan:   Principal Problem:   Bipolar disorder, in partial remission, most recent episode manic ([place]) Active Problems:   Septic shock (HCC)  Sepsis: Present on admission Community-acquired bacterial pneumonia Leukocytosis: -Likely secondary to the sepsis. WBC count improved. Acute hypoxic respiratory failure: RequiredBiPAP initially -Completed Rocephin and Zithromax. -Blood cultures negative.Urine cultures growing 60,000 colonies of multiple species.urine streptococcal antigen negative. Respiratory panel PCR negative. COVID-19 test negative. -Urine Legionella antigennegative. -Still spiking temperatures. Tmax 103.   Consulted infectious disease. -Patient had a recent CAT scan of his chest abdomen pelvis on 01/04/2019 at OSH which only showed some nonspecific enteritis. -2D echo showed EF of 50 to 55% with no valvular vegetation visualized. - Repeat CT of the chest abdomen and pelvison 01/16/2019 showed multiple lung opacities bilaterally, predominantly right lung consistent with multifocal pneumonia. -Also noted was incidental finding of diffuse wall thickening of esophagus, severe wall thickening of terminal ileum concerning for inflammation.He says he has diarrhea. - Consulted GI. 01/21/2019: Fever has resolved.  Patient does not look septic.  History of bipolar disorder -Patient is on multiple medications.Medications have been restarted as per psychiatry recommendations.-Psychiatry consulted. Continue medications per psychiatry recommendation. Follow-up outpatient.  Abdominal pain/diarrhea -Negative work-up so far including stool for C. difficile/GI PCR and cultures. Had recent CT which showed multiple lung opacities as mentioned above, diffuse wall thickening of the esophagus, severe wall thickening of the terminal ileum.  Concern for inflammatory bowel disease. -Consulted gastroenterology. 01/20/2019: EGD and colonoscopy initially planned for today were canceled due to poor bowel preparation.  EGD and colonoscopy have been rescheduled to take place tomorrow. 01/21/2019: Patient underwent EGD and colonoscopy.  Please see documentation above.  Hypokalemia -Potassium improved. 01/20/2019: Potassium was 3.7 today.  Hypophosphatemia -Continue to monitor and replete.    Hypoalbuminemia -Nutrition consult 01/20/2019: Low albumin may be of prognostic significance.  DVT prophylaxis:Heparin Code Status:Full Family Communication: Disposition Plan:Plan to discharge home once symptoms improve  Consultants:PCCM.Psychiatry  Infectious disease, gastroenterology   Procedures:  EGD  done on 01/21/2019  Colonoscopy done on 01/21/2019  Antimicrobials:  Azithromycin, ceftriaxone(9/12- 9/16)   Subjective: No new complaints No diarrhea No fever or chills.  Objective: Vitals:   01/21/19 0902 01/21/19 1034 01/21/19 1044 01/21/19 1054  BP: (!) 111/54 (!) 113/54 (!) 104/52 (!) 142/54  Pulse:  78 61 (!) 58  Resp: (!) 31 17 15 17   Temp: 99.4 F (37.4 C)     TempSrc: Temporal Temporal    SpO2: 94% 100% 100% 99%  Weight: 59.7 kg     Height: 5' 8"  (1.727 m)       Intake/Output Summary (Last 24 hours) at 01/21/2019 1427 Last data filed at 01/21/2019 1150 Gross per 24 hour  Intake 3557.81 ml  Output 1400 ml  Net 2157.81 ml   Filed Weights   01/13/19 1400 01/20/19 0427 01/21/19 0902  Weight: 65 kg 59.7 kg 59.7 kg    Examination:  General exam: Patient is lying in any distress. Respiratory system: Clear to auscultation.   Cardiovascular system: S1 & S2. No murmur. Gastrointestinal system: Abdomen is nondistended, soft and nontender. Normal bowel sounds heard. Central nervous system: Alert and oriented.  Patient moves all extremities.   Extremities: No leg edema.  Data Reviewed: I have personally reviewed following labs and imaging studies  CBC: Recent Labs  Lab 01/15/19 0358 01/16/19 0015 01/17/19 0249 01/18/19 0213 01/20/19 0229  WBC 24.2* 11.7* 7.0 4.3 7.4  NEUTROABS 23.0* 10.8* 6.1 3.1  --   HGB 10.2* 10.0* 9.3* 9.6* 9.8*  HCT 30.5* 29.8* 27.7* 29.1* 31.3*  MCV 79.6* 80.8 79.4* 80.8 81.3  PLT 351 294 279 292 071   Basic Metabolic Panel: Recent Labs  Lab 01/15/19 0358 01/16/19 0015 01/17/19 0249 01/18/19 0213 01/20/19 0229  NA 135 136 136 138 133*  K 3.2* 3.8 3.3* 4.0 3.7  CL 106 110 105 107 102  CO2 22 19* 23 24 22   GLUCOSE 137* 131* 105* 105* 92  BUN <5* 7 <5* <5* <5*  CREATININE 0.70 0.81 0.78 0.74 0.71  CALCIUM 7.2* 7.5* 7.5* 7.6* 7.7*  MG 1.7 1.7 1.6* 2.0  --   PHOS 1.4* 1.8* 2.4* 2.7  --    GFR: Estimated Creatinine  Clearance: 107.8 mL/min (by C-G formula based on SCr of 0.71 mg/dL). Liver Function Tests: Recent Labs  Lab 01/15/19 0358 01/16/19 0015 01/17/19 0249  AST 53* 56* 73*  ALT 28 27 31   ALKPHOS 58 60 71  BILITOT 0.7 0.6 0.5  PROT 4.5* 4.8* 4.6*  ALBUMIN 1.9* 1.8* 1.7*   No results for input(s): LIPASE, AMYLASE in the last 168 hours. No results for input(s): AMMONIA in the last 168 hours. Coagulation Profile: Recent Labs  Lab 01/15/19 0358  INR 1.4*   Cardiac Enzymes: No results for input(s): CKTOTAL, CKMB, CKMBINDEX, TROPONINI in the last 168 hours. BNP (last 3 results) No results for input(s): PROBNP in the last 8760 hours. HbA1C: No results for input(s): HGBA1C in the last 72 hours. CBG: Recent Labs  Lab 01/20/19 2129 01/21/19 0239 01/21/19 0545 01/21/19 0803 01/21/19 1208  GLUCAP 93 86 87 99 76   Lipid Profile: No results for input(s): CHOL, HDL, LDLCALC, TRIG, CHOLHDL, LDLDIRECT in the last 72 hours. Thyroid Function Tests: No results for input(s): TSH, T4TOTAL, FREET4, T3FREE, THYROIDAB in the last 72 hours. Anemia Panel: No results for input(s): VITAMINB12, FOLATE, FERRITIN, TIBC, IRON, RETICCTPCT in the last 72 hours. Sepsis Labs: Recent Labs  Lab 01/15/19 0358 01/16/19 0015  PROCALCITON 3.34 3.30  LATICACIDVEN 1.2 0.9    Recent Results (from the past 240 hour(s))  Culture, blood (routine x 2)     Status: None   Collection Time: 01/13/19  3:27 PM   Specimen: BLOOD RIGHT HAND  Result Value Ref Range Status   Specimen Description BLOOD RIGHT HAND  Final  Special Requests   Final    BOTTLES DRAWN AEROBIC AND ANAEROBIC Blood Culture results may not be optimal due to an inadequate volume of blood received in culture bottles   Culture   Final    NO GROWTH 5 DAYS Performed at [facility], [place] [address] [place], [place] [address]    Report Status 01/18/2019 FINAL  Final  SARS Coronavirus 2 [facility] order, Performed in [facility] hospital lab)  Nasopharyngeal Nasopharyngeal Swab     Status: None   Collection Time: 01/13/19  3:37 PM   Specimen: Nasopharyngeal Swab  Result Value Ref Range Status   SARS Coronavirus 2 NEGATIVE NEGATIVE Final    Comment: (NOTE) If result is NEGATIVE SARS-CoV-2 target nucleic acids are NOT DETECTED. The SARS-CoV-2 RNA is generally detectable in upper and lower  respiratory specimens during the acute phase of infection. The lowest  concentration of SARS-CoV-2 viral copies this assay can detect is 250  copies / mL. A negative result does not preclude SARS-CoV-2 infection  and should not be used as the sole basis for treatment or other  patient management decisions.  A negative result may occur with  improper specimen collection / handling, submission of specimen other  than nasopharyngeal swab, presence of viral mutation(s) within the  areas targeted by this assay, and inadequate number of viral copies  (<250 copies / mL). A negative result must be combined with clinical  observations, patient history, and epidemiological information. If result is POSITIVE SARS-CoV-2 target nucleic acids are DETECTED. The SARS-CoV-2 RNA is generally detectable in upper and lower  respiratory specimens dur ing the acute phase of infection.  Positive  results are indicative of active infection with SARS-CoV-2.  Clinical  correlation with patient history and other diagnostic information is  necessary to determine patient infection status.  Positive results do  not rule out bacterial infection or co-infection with other viruses. If result is PRESUMPTIVE POSTIVE SARS-CoV-2 nucleic acids MAY BE PRESENT.   A presumptive positive result was obtained on the submitted specimen  and confirmed on repeat testing.  While 2019 novel coronavirus  (SARS-CoV-2) nucleic acids may be present in the submitted sample  additional confirmatory testing may be necessary for epidemiological  and / or clinical management purposes  to  differentiate between  SARS-CoV-2 and other Sarbecovirus currently known to infect humans.  If clinically indicated additional testing with an alternate test  methodology [phone]) is advised. The SARS-CoV-2 RNA is generally  detectable in upper and lower respiratory sp ecimens during the acute  phase of infection. The expected result is Negative. Fact Sheet for Patients:  StrictlyIdeas.no Fact Sheet for Healthcare Providers: BankingDealers.co.za This test is not yet approved or cleared by the [place] FDA and has been authorized for detection and/or diagnosis of SARS-CoV-2 by FDA under an Emergency Use Authorization (EUA).  This EUA will remain in effect (meaning this test can be used) for the duration of the COVID-19 declaration under Section 564(b)(1) of the Act, 21 U.S.C. section 360bbb-3(b)(1), unless the authorization is terminated or revoked sooner. Performed at [facility], [place] [address] [place], [place] [address]   Urine culture     Status: Abnormal   Collection Time: 01/13/19  4:56 PM   Specimen: In/Out Cath Urine  Result Value Ref Range Status   Specimen Description IN/OUT CATH URINE  Final   Special Requests   Final    NONE Performed at [facility], [place],  [place] [address]    Culture (A)  Final    60,000 COLONIES/mL MULTIPLE SPECIES PRESENT, SUGGEST RECOLLECTION   Report Status 01/15/2019 FINAL  Final  C difficile quick scan w PCR reflex     Status: None   Collection Time: 01/13/19  5:35 PM   Specimen: STOOL  Result Value Ref Range Status   C Diff antigen NEGATIVE NEGATIVE Final   C Diff toxin NEGATIVE NEGATIVE Final   C Diff interpretation No C. difficile detected.  Final    Comment: Performed at [facility], [place] [address], [place], [place] [address]  Gastrointestinal Panel by PCR , Stool     Status: None   Collection Time: 01/13/19  5:36 PM   Specimen: STOOL  Result  Value Ref Range Status   Campylobacter species NOT DETECTED NOT DETECTED Final   Plesimonas shigelloides NOT DETECTED NOT DETECTED Final   Salmonella species NOT DETECTED NOT DETECTED Final   Yersinia enterocolitica NOT DETECTED NOT DETECTED Final   Vibrio species NOT DETECTED NOT DETECTED Final   Vibrio cholerae NOT DETECTED NOT DETECTED Final   Enteroaggregative E coli (EAEC) NOT DETECTED NOT DETECTED Final   Enteropathogenic E coli (EPEC) NOT DETECTED NOT DETECTED Final   Enterotoxigenic E coli (ETEC) NOT DETECTED NOT DETECTED Final   Shiga like toxin producing E coli (STEC) NOT DETECTED NOT DETECTED Final   Shigella/Enteroinvasive E coli (EIEC) NOT DETECTED NOT DETECTED Final   Cryptosporidium NOT DETECTED NOT DETECTED Final   Cyclospora cayetanensis NOT DETECTED NOT DETECTED Final   Entamoeba histolytica NOT DETECTED NOT DETECTED Final   Giardia lamblia NOT DETECTED NOT DETECTED Final   Adenovirus F40/41 NOT DETECTED NOT DETECTED Final   Astrovirus NOT DETECTED NOT DETECTED Final   Norovirus GI/GII NOT DETECTED NOT DETECTED Final   Rotavirus A NOT DETECTED NOT DETECTED Final   Sapovirus (I, II, IV, and V) NOT DETECTED NOT DETECTED Final    Comment: Performed at [facility], [place] [place], [place] [address]  Culture, blood (routine x 2)     Status: None   Collection Time: 01/13/19  7:30 PM   Specimen: BLOOD RIGHT ARM  Result Value Ref Range Status   Specimen Description BLOOD RIGHT ARM  Final   Special Requests   Final    BOTTLES DRAWN AEROBIC AND ANAEROBIC Blood Culture results may not be optimal due to an inadequate volume of blood received in culture bottles   Culture   Final    NO GROWTH 5 DAYS Performed at [facility], [address], [place], [place] [address]    Report Status 01/18/2019 FINAL  Final  MRSA PCR Screening     Status: None   Collection Time: 01/14/19  2:01 AM   Specimen: Nasal Mucosa; Nasopharyngeal  Result Value Ref Range  Status   MRSA by PCR NEGATIVE NEGATIVE Final    Comment:        The GeneXpert MRSA Assay (FDA approved for NASAL specimens only), is one component of a comprehensive MRSA colonization surveillance program. It is not intended to diagnose MRSA infection nor to guide or monitor treatment for MRSA infections. Performed at [facility], [place] [address] [place], [place] [address]   Respiratory Panel by PCR     Status: None   Collection Time: 01/14/19  9:14 AM   Specimen: Nasopharyngeal Swab; Respiratory  Result Value Ref Range Status   Adenovirus NOT DETECTED NOT DETECTED Final   Coronavirus 229E NOT  DETECTED NOT DETECTED Final    Comment: (NOTE) The Coronavirus on the Respiratory Panel, DOES NOT test for the novel  Coronavirus (2019 nCoV)    Coronavirus HKU1 NOT DETECTED NOT DETECTED Final   Coronavirus NL63 NOT DETECTED NOT DETECTED Final   Coronavirus OC43 NOT DETECTED NOT DETECTED Final   Metapneumovirus NOT DETECTED NOT DETECTED Final   Rhinovirus / Enterovirus NOT DETECTED NOT DETECTED Final   Influenza A NOT DETECTED NOT DETECTED Final   Influenza B NOT DETECTED NOT DETECTED Final   Parainfluenza Virus 1 NOT DETECTED NOT DETECTED Final   Parainfluenza Virus 2 NOT DETECTED NOT DETECTED Final   Parainfluenza Virus 3 NOT DETECTED NOT DETECTED Final   Parainfluenza Virus 4 NOT DETECTED NOT DETECTED Final   Respiratory Syncytial Virus NOT DETECTED NOT DETECTED Final   Bordetella pertussis NOT DETECTED NOT DETECTED Final   Chlamydophila pneumoniae NOT DETECTED NOT DETECTED Final   Mycoplasma pneumoniae NOT DETECTED NOT DETECTED Final    Comment: Performed at [facility], [place] [address], [place], [place] [address]  Culture, blood (Routine X 2) w Reflex to ID Panel     Status: None   Collection Time: 01/14/19  9:23 PM   Specimen: BLOOD  Result Value Ref Range Status   Specimen Description BLOOD RIGHT ARM  Final   Special Requests   Final    BOTTLES DRAWN AEROBIC  AND ANAEROBIC Blood Culture adequate volume   Culture   Final    NO GROWTH 5 DAYS Performed at [facility], [address], [place], [place] [address]    Report Status 01/19/2019 FINAL  Final  Culture, blood (Routine X 2) w Reflex to ID Panel     Status: None   Collection Time: 01/14/19  9:23 PM   Specimen: BLOOD  Result Value Ref Range Status   Specimen Description BLOOD LEFT HAND  Final   Special Requests   Final    BOTTLES DRAWN AEROBIC AND ANAEROBIC Blood Culture adequate volume   Culture   Final    NO GROWTH 5 DAYS Performed at [facility], [place] [address] [place], [place] [address]    Report Status 01/19/2019 FINAL  Final  SARS CORONAVIRUS 2 (TAT 6-24 HRS) Nasopharyngeal Nasopharyngeal Swab     Status: None   Collection Time: 01/16/19  5:20 PM   Specimen: Nasopharyngeal Swab  Result Value Ref Range Status   SARS Coronavirus 2 NEGATIVE NEGATIVE Final    Comment: (NOTE) SARS-CoV-2 target nucleic acids are NOT DETECTED. The SARS-CoV-2 RNA is generally detectable in upper and lower respiratory specimens during the acute phase of infection. Negative results do not preclude SARS-CoV-2 infection, do not rule out co-infections with other pathogens, and should not be used as the sole basis for treatment or other patient management decisions. Negative results must be combined with clinical observations, patient history, and epidemiological information. The expected result is Negative. Fact Sheet for Patients: SugarRoll.be Fact Sheet for Healthcare Providers: https://www.woods-mathews.com/ This test is not yet approved or cleared by the [place] FDA and  has been authorized for detection and/or diagnosis of SARS-CoV-2 by FDA under an Emergency Use Authorization (EUA). This EUA will remain  in effect (meaning this test can be used) for the duration of the COVID-19 declaration under Section 56 4(b)(1) of the Act, 21  U.S.C. section 360bbb-3(b)(1), unless the authorization is terminated or revoked sooner. Performed at [facility], [place] [address] [place], [place] [address]  Radiology Studies: Ct Entero Abd/pelvis W Contast  Result Date: 01/21/2019 CLINICAL DATA:  Inflammatory bowel disease EXAM: CT ABDOMEN AND PELVIS WITH CONTRAST (ENTEROGRAPHY) TECHNIQUE: Multidetector CT of the abdomen and pelvis during bolus administration of intravenous contrast. Negative oral contrast was given. CONTRAST:  133m OMNIPAQUE IOHEXOL 300 MG/ML  SOLN COMPARISON:  CT chest abdomen pelvis, 01/16/2019 FINDINGS: Lower chest: There is extensive, right greater than left bibasilar heterogeneous and consolidative airspace disease, which is worsened in comparison to recent CT dated 01/16/2019. Hepatobiliary: No solid liver abnormality is seen. No gallstones, gallbladder wall thickening, or biliary dilatation. Pancreas: Unremarkable. No pancreatic ductal dilatation or surrounding inflammatory changes. Spleen: Normal in size without significant abnormality. Adrenals/Urinary Tract: Adrenal glands are unremarkable. Kidneys are normal, without renal calculi, solid lesion, or hydronephrosis. Bladder is unremarkable. Stomach/Bowel: Stomach is within normal limits. Appendix appears normal. The small bowel and colon are fluid-filled by negative contrast to the rectum without overt distention. There is redemonstrated thickening and luminal narrowing of two segments of terminal ileum involving the distal ~30 cm of the ileum separated by a short segment of relatively normal appearing bowel, this overall appearance with somewhat fixed, tethered configuration in comparison to prior examination (series 3, image 51, series 6, image 66, 75). Vascular/Lymphatic: No significant vascular findings are present. No enlarged abdominal or pelvic lymph nodes. Reproductive: No mass or other significant abnormality. Other: No abdominal wall hernia or  abnormality. Small volume free fluid in the right lower quadrant and low pelvis. Musculoskeletal: There is sclerosis of the bilateral sacroiliac joints (series 4, image 157). IMPRESSION: 1. There is redemonstrated thickening and luminal narrowing of two segments of terminal ileum involving the distal ~30 cm of the ileum separated by a short segment of relatively normal appearing bowel, this overall appearance with somewhat fixed, tethered configuration in comparison to prior examination (series 3, image 51, series 6, image 66, 75). Findings are consistent with infectious or inflammatory enteritis, particularly Crohn's disease given this appearance and distribution. The involved segments are suspicious for stricture although there is no evidence of overt bowel obstruction. No evidence of fistula or complication by abscess formation. 2. The remainder of the bowel is normal in appearance, fluid filled by negative enteric contrast. 3. Small volume free fluid in the right lower quadrant and low pelvis, likely reactive. 4. There is extensive, right greater than left bibasilar heterogeneous and consolidative airspace disease, which is worsened in comparison to recent CT dated 01/16/2019 and consistent with multifocal infection. 5. There is sclerosis of the bilateral sacroiliac joints (series 4, image 157), advanced for patient age and consistent with inflammatory sacroiliitis, as can be seen in association with inflammatory bowel disease. Electronically Signed   By: AEddie CandleM.D.   On: 01/21/2019 14:19        Scheduled Meds:  ARIPiprazole  10 mg Oral QHS   benztropine  1 mg Oral Daily   Chlorhexidine Gluconate Cloth  6 each Topical Daily   citalopram  20 mg Oral QHS   heparin  5,000 Units Subcutaneous Q8H   influenza vac split quadrivalent PF  0.5 mL Intramuscular Tomorrow-1000   insulin aspart  0-5 Units Subcutaneous QHS   insulin aspart  0-9 Units Subcutaneous TID WC   polyethylene  glycol-electrolytes  2,000 mL Oral Once   polyethylene glycol-electrolytes  4,000 mL Oral Once   sodium chloride flush  3 mL Intravenous Q12H   traZODone  200 mg Oral QHS   Continuous Infusions:  sodium chloride 75 mL/hr at 01/21/19  0300   sodium chloride 125 mL/hr at 01/16/19 0118     LOS: 8 days    Bonnell Public, MD [facility] Pager on [place]  If 7PM-7AM, please contact night-coverage [email or web address] Password [email or web address] 01/21/2019, 2:27 PM

## 2019-01-21 NOTE — Progress Notes (Signed)
Patient spiked temp, notified MD and tylenol given.

## 2019-01-22 ENCOUNTER — Other Ambulatory Visit: Payer: Self-pay | Admitting: Anatomic Pathology & Clinical Pathology

## 2019-01-22 DIAGNOSIS — R197 Diarrhea, unspecified: Secondary | ICD-10-CM

## 2019-01-22 LAB — GLUCOSE, CAPILLARY
Glucose-Capillary: 141 mg/dL — ABNORMAL HIGH (ref 70–99)
Glucose-Capillary: 137 mg/dL — ABNORMAL HIGH (ref 70–99)
Glucose-Capillary: 233 mg/dL — ABNORMAL HIGH (ref 70–99)
Glucose-Capillary: 213 mg/dL — ABNORMAL HIGH (ref 70–99)

## 2019-01-22 MED ORDER — METHYLPREDNISOLONE SODIUM SUCC 125 MG IJ SOLR
60.0000 mg | INTRAMUSCULAR | Status: DC
  Administered 2019-01-22 – 2019-01-25 (×4): 60 mg via INTRAVENOUS
  Filled 2019-01-22 (×4): qty 2

## 2019-01-22 NOTE — Progress Notes (Signed)
Subjective: Patient had one episode of loose bowel movement today.  He also had a temperature of 101.1 F in the last 24 hours.  Objective: Vital signs in last 24 hours: Temp:  [99 F (37.2 C)-101.1 F (38.4 C)] 99 F (37.2 C) (09/21 0640) Pulse Rate:  [58-83] 72 (09/21 0640) Resp:  [15-19] 18 (09/21 0640) BP: (99-142)/(52-69) 99/57 (09/21 0640) SpO2:  [96 %-100 %] 96 % (09/21 0640) Weight change:  Last BM Date: 01/11/19  PE: Lying on bed, not in distress GENERAL: Prominent pallor, moist mucous membrane, no icterus ABDOMEN: Soft, nondistended, normal active bowel sounds EXTREMITIES: No deformity  Lab Results: Results for orders placed or performed during the hospital encounter of 01/13/19 (from the past 48 hour(s))  Glucose, capillary     Status: None   Collection Time: 01/20/19 12:33 PM  Result Value Ref Range   Glucose-Capillary 73 70 - 99 mg/dL  Glucose, capillary     Status: None   Collection Time: 01/20/19  4:54 PM  Result Value Ref Range   Glucose-Capillary 78 70 - 99 mg/dL  Glucose, capillary     Status: None   Collection Time: 01/20/19  9:29 PM  Result Value Ref Range   Glucose-Capillary 93 70 - 99 mg/dL  Glucose, capillary     Status: None   Collection Time: 01/21/19  2:39 AM  Result Value Ref Range   Glucose-Capillary 86 70 - 99 mg/dL  Glucose, capillary     Status: None   Collection Time: 01/21/19  5:45 AM  Result Value Ref Range   Glucose-Capillary 87 70 - 99 mg/dL  Glucose, capillary     Status: None   Collection Time: 01/21/19  8:03 AM  Result Value Ref Range   Glucose-Capillary 99 70 - 99 mg/dL  Glucose, capillary     Status: None   Collection Time: 01/21/19 12:08 PM  Result Value Ref Range   Glucose-Capillary 76 70 - 99 mg/dL  Glucose, capillary     Status: None   Collection Time: 01/21/19  5:09 PM  Result Value Ref Range   Glucose-Capillary 99 70 - 99 mg/dL  Glucose, capillary     Status: Abnormal   Collection Time: 01/21/19  9:57 PM  Result  Value Ref Range   Glucose-Capillary 102 (H) 70 - 99 mg/dL  Glucose, capillary     Status: Abnormal   Collection Time: 01/21/19 10:53 PM  Result Value Ref Range   Glucose-Capillary 102 (H) 70 - 99 mg/dL  Glucose, capillary     Status: Abnormal   Collection Time: 01/22/19  8:02 AM  Result Value Ref Range   Glucose-Capillary 141 (H) 70 - 99 mg/dL    Studies/Results: Ct Entero Abd/pelvis W Contast  Result Date: 01/21/2019 CLINICAL DATA:  Inflammatory bowel disease EXAM: CT ABDOMEN AND PELVIS WITH CONTRAST (ENTEROGRAPHY) TECHNIQUE: Multidetector CT of the abdomen and pelvis during bolus administration of intravenous contrast. Negative oral contrast was given. CONTRAST:  100mL OMNIPAQUE IOHEXOL 300 MG/ML  SOLN COMPARISON:  CT chest abdomen pelvis, 01/16/2019 FINDINGS: Lower chest: There is extensive, right greater than left bibasilar heterogeneous and consolidative airspace disease, which is worsened in comparison to recent CT dated 01/16/2019. Hepatobiliary: No solid liver abnormality is seen. No gallstones, gallbladder wall thickening, or biliary dilatation. Pancreas: Unremarkable. No pancreatic ductal dilatation or surrounding inflammatory changes. Spleen: Normal in size without significant abnormality. Adrenals/Urinary Tract: Adrenal glands are unremarkable. Kidneys are normal, without renal calculi, solid lesion, or hydronephrosis. Bladder is unremarkable. Stomach/Bowel: Stomach is   within normal limits. Appendix appears normal. The small bowel and colon are fluid-filled by negative contrast to the rectum without overt distention. There is redemonstrated thickening and luminal narrowing of two segments of terminal ileum involving the distal ~30 cm of the ileum separated by a short segment of relatively normal appearing bowel, this overall appearance with somewhat fixed, tethered configuration in comparison to prior examination (series 3, image 51, series 6, image 66, 75). Vascular/Lymphatic: No  significant vascular findings are present. No enlarged abdominal or pelvic lymph nodes. Reproductive: No mass or other significant abnormality. Other: No abdominal wall hernia or abnormality. Small volume free fluid in the right lower quadrant and low pelvis. Musculoskeletal: There is sclerosis of the bilateral sacroiliac joints (series 4, image 157). IMPRESSION: 1. There is redemonstrated thickening and luminal narrowing of two segments of terminal ileum involving the distal ~30 cm of the ileum separated by a short segment of relatively normal appearing bowel, this overall appearance with somewhat fixed, tethered configuration in comparison to prior examination (series 3, image 51, series 6, image 66, 75). Findings are consistent with infectious or inflammatory enteritis, particularly Crohn's disease given this appearance and distribution. The involved segments are suspicious for stricture although there is no evidence of overt bowel obstruction. No evidence of fistula or complication by abscess formation. 2. The remainder of the bowel is normal in appearance, fluid filled by negative enteric contrast. 3. Small volume free fluid in the right lower quadrant and low pelvis, likely reactive. 4. There is extensive, right greater than left bibasilar heterogeneous and consolidative airspace disease, which is worsened in comparison to recent CT dated 01/16/2019 and consistent with multifocal infection. 5. There is sclerosis of the bilateral sacroiliac joints (series 4, image 157), advanced for patient age and consistent with inflammatory sacroiliitis, as can be seen in association with inflammatory bowel disease. Electronically Signed   By: Alex  Bibbey M.D.   On: 01/21/2019 14:19    Medications: I have reviewed the patient's current medications.  Assessment: Thickening and luminal narrowing of 2 segments of terminal ileum involving distal almost 30 cm of ileum, separated by a short segment of relatively  normal-appearing bowel wall, suspicious for inflammatory enteritis particularly Crohn's. Although there is evidence of stricturing there is no evidence of bowel obstruction, fistula or abscess formation Elevated CRP of 70.7, elevated ESR 53, microcytic anemia, malnutrition(total protein 4.6, albumin 1.7) EGD from 01/21/2019 showed distal esophagitis and gastric ulcer Severe stenosis of ileocecal valve, terminal ileum could not be intubated, rectal ulcers, biopsy pending  Extensive right greater than left heterogeneous consolidative airspace disease, consistent with multifocal infection  Sclerosis of bilateral sacroiliac joints  Plan: Overall clinical presentation(diarrhea for over 4 years, elevated ESR, CRP, abnormal CT enterography) suggestive of Crohn's disease. Biopsies pending. We will send ANCA and ASCA while patient is in-house. Patient continues to be febrile, discussed with Dr. Comer from ID, fever is likely inflammatory in origin and less likely infectious in origin. Okay to start IV methylprednisolone, will start 60 mg IV. Patient able to tolerate soft diet.     01/22/2019, 10:36 AM   Pager [phone] If no answer or after 5 PM call [phone] 

## 2019-01-22 NOTE — Progress Notes (Signed)
[facility] for Infectious Disease   Reason for visit: Follow up on fever  Interval History: had one fever to 101.1 yesterday, no clinical changes, still with persistent diarrhea.  Biopsies pending from EGD/colonoscopy done yesterday but seems c/w Crohn's disease.  No issues with breathing, no doe   Physical Exam: Constitutional:  Vitals:   01/21/19 2158 01/22/19 0640  BP: 111/69 (!) 99/57  Pulse: 83 72  Resp: 18 18  Temp: 99.4 F (37.4 C) 99 F (37.2 C)  SpO2: 96% 96%   patient appears in NAD Eyes: anicteric Respiratory: Normal respiratory effort; CTA B Cardiovascular: RRR GI: soft, nt, nd  Review of Systems: Constitutional: negative for weight loss Gastrointestinal: positive for diarrhea, negative for nausea and vomiting Integument/breast: negative for rash  Lab Results  Component Value Date   WBC 7.4 01/20/2019   HGB 9.8 (L) 01/20/2019   HCT 31.3 (L) 01/20/2019   MCV 81.3 01/20/2019   PLT 366 01/20/2019    Lab Results  Component Value Date   CREATININE 0.71 01/20/2019   BUN <5 (L) 01/20/2019   NA 133 (L) 01/20/2019   K 3.7 01/20/2019   CL 102 01/20/2019   CO2 22 01/20/2019    Lab Results  Component Value Date   ALT 31 01/17/2019   AST 73 (H) 01/17/2019   ALKPHOS 71 01/17/2019     Microbiology: Recent Results (from the past 240 hour(s))  Culture, blood (routine x 2)     Status: None   Collection Time: 01/13/19  3:27 PM   Specimen: BLOOD RIGHT HAND  Result Value Ref Range Status   Specimen Description BLOOD RIGHT HAND  Final   Special Requests   Final    BOTTLES DRAWN AEROBIC AND ANAEROBIC Blood Culture results may not be optimal due to an inadequate volume of blood received in culture bottles   Culture   Final    NO GROWTH 5 DAYS Performed at [facility], [place] [address] [place], [place] [address]    Report Status 01/18/2019 FINAL  Final  SARS Coronavirus 2 [facility] order, Performed in [facility] hospital lab) Nasopharyngeal  Nasopharyngeal Swab     Status: None   Collection Time: 01/13/19  3:37 PM   Specimen: Nasopharyngeal Swab  Result Value Ref Range Status   SARS Coronavirus 2 NEGATIVE NEGATIVE Final    Comment: (NOTE) If result is NEGATIVE SARS-CoV-2 target nucleic acids are NOT DETECTED. The SARS-CoV-2 RNA is generally detectable in upper and lower  respiratory specimens during the acute phase of infection. The lowest  concentration of SARS-CoV-2 viral copies this assay can detect is 250  copies / mL. A negative result does not preclude SARS-CoV-2 infection  and should not be used as the sole basis for treatment or other  patient management decisions.  A negative result may occur with  improper specimen collection / handling, submission of specimen other  than nasopharyngeal swab, presence of viral mutation(s) within the  areas targeted by this assay, and inadequate number of viral copies  (<250 copies / mL). A negative result must be combined with clinical  observations, patient history, and epidemiological information. If result is POSITIVE SARS-CoV-2 target nucleic acids are DETECTED. The SARS-CoV-2 RNA is generally detectable in upper and lower  respiratory specimens dur ing the acute phase of infection.  Positive  results are indicative of active infection with SARS-CoV-2.  Clinical  correlation with patient history and other diagnostic information is  necessary to determine patient infection  status.  Positive results do  not rule out bacterial infection or co-infection with other viruses. If result is PRESUMPTIVE POSTIVE SARS-CoV-2 nucleic acids MAY BE PRESENT.   A presumptive positive result was obtained on the submitted specimen  and confirmed on repeat testing.  While 2019 novel coronavirus  (SARS-CoV-2) nucleic acids may be present in the submitted sample  additional confirmatory testing may be necessary for epidemiological  and / or clinical management purposes  to differentiate between   SARS-CoV-2 and other Sarbecovirus currently known to infect humans.  If clinically indicated additional testing with an alternate test  methodology [phone]) is advised. The SARS-CoV-2 RNA is generally  detectable in upper and lower respiratory sp ecimens during the acute  phase of infection. The expected result is Negative. Fact Sheet for Patients:  StrictlyIdeas.no Fact Sheet for Healthcare Providers: BankingDealers.co.za This test is not yet approved or cleared by the [place] FDA and has been authorized for detection and/or diagnosis of SARS-CoV-2 by FDA under an Emergency Use Authorization (EUA).  This EUA will remain in effect (meaning this test can be used) for the duration of the COVID-19 declaration under Section 564(b)(1) of the Act, 21 U.S.C. section 360bbb-3(b)(1), unless the authorization is terminated or revoked sooner. Performed at [facility], [place] [address] [place], [place] [address]   Urine culture     Status: Abnormal   Collection Time: 01/13/19  4:56 PM   Specimen: In/Out Cath Urine  Result Value Ref Range Status   Specimen Description IN/OUT CATH URINE  Final   Special Requests   Final    NONE Performed at [facility], [place] [address] [place], [place] [address]    Culture (A)  Final    60,000 COLONIES/mL MULTIPLE SPECIES PRESENT, SUGGEST RECOLLECTION   Report Status 01/15/2019 FINAL  Final  C difficile quick scan w PCR reflex     Status: None   Collection Time: 01/13/19  5:35 PM   Specimen: STOOL  Result Value Ref Range Status   C Diff antigen NEGATIVE NEGATIVE Final   C Diff toxin NEGATIVE NEGATIVE Final   C Diff interpretation No C. difficile detected.  Final    Comment: Performed at [facility], [place] [address] [place], [place] [address]  Gastrointestinal Panel by PCR , Stool     Status: None   Collection Time: 01/13/19  5:36 PM   Specimen: STOOL  Result Value Ref Range Status    Campylobacter species NOT DETECTED NOT DETECTED Final   Plesimonas shigelloides NOT DETECTED NOT DETECTED Final   Salmonella species NOT DETECTED NOT DETECTED Final   Yersinia enterocolitica NOT DETECTED NOT DETECTED Final   Vibrio species NOT DETECTED NOT DETECTED Final   Vibrio cholerae NOT DETECTED NOT DETECTED Final   Enteroaggregative E coli (EAEC) NOT DETECTED NOT DETECTED Final   Enteropathogenic E coli (EPEC) NOT DETECTED NOT DETECTED Final   Enterotoxigenic E coli (ETEC) NOT DETECTED NOT DETECTED Final   Shiga like toxin producing E coli (STEC) NOT DETECTED NOT DETECTED Final   Shigella/Enteroinvasive E coli (EIEC) NOT DETECTED NOT DETECTED Final   Cryptosporidium NOT DETECTED NOT DETECTED Final   Cyclospora cayetanensis NOT DETECTED NOT DETECTED Final   Entamoeba histolytica NOT DETECTED NOT DETECTED Final   Giardia lamblia NOT DETECTED NOT DETECTED Final   Adenovirus F40/41 NOT DETECTED NOT DETECTED Final   Astrovirus NOT DETECTED NOT DETECTED Final   Norovirus GI/GII NOT DETECTED NOT DETECTED Final   Rotavirus A  NOT DETECTED NOT DETECTED Final   Sapovirus (I, II, IV, and V) NOT DETECTED NOT DETECTED Final    Comment: Performed at [facility], [place] [place], [place] [address]  Culture, blood (routine x 2)     Status: None   Collection Time: 01/13/19  7:30 PM   Specimen: BLOOD RIGHT ARM  Result Value Ref Range Status   Specimen Description BLOOD RIGHT ARM  Final   Special Requests   Final    BOTTLES DRAWN AEROBIC AND ANAEROBIC Blood Culture results may not be optimal due to an inadequate volume of blood received in culture bottles   Culture   Final    NO GROWTH 5 DAYS Performed at [facility], [place] [address], [place], [place] [address]    Report Status 01/18/2019 FINAL  Final  MRSA PCR Screening     Status: None   Collection Time: 01/14/19  2:01 AM   Specimen: Nasal Mucosa; Nasopharyngeal  Result Value Ref Range Status   MRSA by PCR  NEGATIVE NEGATIVE Final    Comment:        The GeneXpert MRSA Assay (FDA approved for NASAL specimens only), is one component of a comprehensive MRSA colonization surveillance program. It is not intended to diagnose MRSA infection nor to guide or monitor treatment for MRSA infections. Performed at [facility], [place] [address], [place], [place] [address]   Respiratory Panel by PCR     Status: None   Collection Time: 01/14/19  9:14 AM   Specimen: Nasopharyngeal Swab; Respiratory  Result Value Ref Range Status   Adenovirus NOT DETECTED NOT DETECTED Final   Coronavirus 229E NOT DETECTED NOT DETECTED Final    Comment: (NOTE) The Coronavirus on the Respiratory Panel, DOES NOT test for the novel  Coronavirus (2019 nCoV)    Coronavirus HKU1 NOT DETECTED NOT DETECTED Final   Coronavirus NL63 NOT DETECTED NOT DETECTED Final   Coronavirus OC43 NOT DETECTED NOT DETECTED Final   Metapneumovirus NOT DETECTED NOT DETECTED Final   Rhinovirus / Enterovirus NOT DETECTED NOT DETECTED Final   Influenza A NOT DETECTED NOT DETECTED Final   Influenza B NOT DETECTED NOT DETECTED Final   Parainfluenza Virus 1 NOT DETECTED NOT DETECTED Final   Parainfluenza Virus 2 NOT DETECTED NOT DETECTED Final   Parainfluenza Virus 3 NOT DETECTED NOT DETECTED Final   Parainfluenza Virus 4 NOT DETECTED NOT DETECTED Final   Respiratory Syncytial Virus NOT DETECTED NOT DETECTED Final   Bordetella pertussis NOT DETECTED NOT DETECTED Final   Chlamydophila pneumoniae NOT DETECTED NOT DETECTED Final   Mycoplasma pneumoniae NOT DETECTED NOT DETECTED Final    Comment: Performed at [facility], [place]. [address] [place], [place] [address]  Culture, blood (Routine X 2) w Reflex to ID Panel     Status: None   Collection Time: 01/14/19  9:23 PM   Specimen: BLOOD  Result Value Ref Range Status   Specimen Description BLOOD RIGHT ARM  Final   Special Requests   Final    BOTTLES DRAWN AEROBIC AND ANAEROBIC Blood  Culture adequate volume   Culture   Final    NO GROWTH 5 DAYS Performed at [facility], [address], [place], [place] [address]    Report Status 01/19/2019 FINAL  Final  Culture, blood (Routine X 2) w Reflex to ID Panel     Status: None   Collection Time: 01/14/19  9:23 PM   Specimen: BLOOD  Result Value Ref Range Status   Specimen Description BLOOD LEFT HAND  Final   Special Requests   Final    BOTTLES DRAWN AEROBIC AND ANAEROBIC Blood Culture adequate volume   Culture   Final    NO GROWTH 5 DAYS Performed at [facility], [address], [place], [place] [address]    Report Status 01/19/2019 FINAL  Final  SARS CORONAVIRUS 2 (TAT 6-24 HRS) Nasopharyngeal Nasopharyngeal Swab     Status: None   Collection Time: 01/16/19  5:20 PM   Specimen: Nasopharyngeal Swab  Result Value Ref Range Status   SARS Coronavirus 2 NEGATIVE NEGATIVE Final    Comment: (NOTE) SARS-CoV-2 target nucleic acids are NOT DETECTED. The SARS-CoV-2 RNA is generally detectable in upper and lower respiratory specimens during the acute phase of infection. Negative results do not preclude SARS-CoV-2 infection, do not rule out co-infections with other pathogens, and should not be used as the sole basis for treatment or other patient management decisions. Negative results must be combined with clinical observations, patient history, and epidemiological information. The expected result is Negative. Fact Sheet for Patients: SugarRoll.be Fact Sheet for Healthcare Providers: https://www.woods-mathews.com/ This test is not yet approved or cleared by the [place] FDA and  has been authorized for detection and/or diagnosis of SARS-CoV-2 by FDA under an Emergency Use Authorization (EUA). This EUA will remain  in effect (meaning this test can be used) for the duration of the COVID-19 declaration under Section 56 4(b)(1) of the Act, 21 U.S.C. section  360bbb-3(b)(1), unless the authorization is terminated or revoked sooner. Performed at [facility], [place] [address] [place], [place] [address]     Impression/Plan:  1. Fever - at this point, his probable infection has resolved and I most suspect his current fever is related to the IBD not yet identified.  Will continue to monitor.  2.  Pneumonia - resolved.  3.  Diarrhea - as above, I most suspect IBD and I am in agreement with starting steroids today while waiting biopsy results.   Discussed with Dr. Therisa Doyne

## 2019-01-22 NOTE — Progress Notes (Signed)
  Speech Language Pathology Treatment: Dysphagia  Patient Details Name: Carl Carey MRN: [id number] DOB: 07/26/1980 Today's Date: 01/22/2019 Time: 6837-2902 SLP Time Calculation (min) (ACUTE ONLY): 9 min  Assessment / Plan / Recommendation Clinical Impression  Pt tolerated regular solids and thin liquids with no clinical s/s of aspiration.  Pt reports he is having an easier time chewing today.  Following yesterday's EGD which revealed esophagitis and ulcers, pt was placed on GI SOFT diet.  He reports he is not having difficulty with this consistency.  Pt reported sore throat on R side which improved when drinking thin liquids. Recommend continuing current diet with thin liquids.  SLP will sign off at this time.    HPI HPI: Pt is a 38 yo male presenting with AMS and vomiting, admitted with septic shock and respiratory failure requiring BiPAP. CXR concerning for PNA and possible aspiration. Pt was evaluated by SLP in 2016, initially NPO but then advancing to Dys 2 solids, thin liquids primarily due to mentation. PMH: parkinsonism, panic attack, malnutrition, DM, bipolar 1 d/o      SLP Plan  All goals met       Recommendations  Diet recommendations: Regular;Thin liquid(GI SOFT) Liquids provided via: Cup;Straw Medication Administration: Whole meds with liquid Supervision: Patient able to self feed Compensations: Slow rate;Small sips/bites Postural Changes and/or Swallow Maneuvers: Seated upright 90 degrees                Oral Care Recommendations: Oral care BID Follow up Recommendations: None SLP Visit Diagnosis: Dysphagia, oral phase (R13.11) Plan: All goals met       [place], MA, [place] Office: [phone] 01/22/2019, 12:26 PM

## 2019-01-22 NOTE — Progress Notes (Signed)
PROGRESS NOTE    Carl Carey  XYV:[id number] DOB: 12-Jan-1981 DOA: 01/13/2019 PCP: Patient, No Pcp Per   Brief Narrative: 38 year old male with history of bipolar disorder presented on 01/13/2019 with shortness of breath and respiratory distress along with altered mental status. He was apparently found by family, lethargic but vomiting. Patient had recent admission to [facility] for gastroenteritis and hyponatremia and reportedly had enteritis on CT scan at that time with negative stool culture/stool for C. difficile and GI PCR. On presentation, he was found to have elevated lactic with leukocytosis. He was started on IV fluids and broad-spectrum antibiotics for possible pneumonia. Now off antibiotics .  Patient underwent EGD and colonoscopy on 01/21/19.  EGD revealed esophagitis, gastric and pyloric ulcers.  Colonoscopy revealed terminal ileal stenosis and scattered rectal ulcers.   CT enterography was suggestive  of Crohn's disease.  Started on steroids today.   Assessment & Plan:   Principal Problem:   Bipolar disorder, in partial remission, most recent episode manic ([place]) Active Problems:   Septic shock (HCC)   Community-acquired pneumonia Acute hypoxic respiratory failure: RequiredBiPAP initially -Completed Rocephin and Zithromax. -Blood cultures negative.Urine streptococcal antigen negative. Respiratory panel PCR negative. COVID-19 test negative. -Urine Legionella antigennegative. --2D echo showed EF of 50 to 55% with no valvular vegetation visualized. - Repeat CT of the chest abdomen and pelvison 01/16/2019 showed multiple lung opacities bilaterally, predominantly right lung consistent with multifocal pneumonia. -Respiratory status stable now. On room air  Abdominal pain/diarrhea/Chron's disease -Negative work-up so far including stool for C. difficile/GI PCR and cultures. Had recent CT which showed multiple lung opacities as mentioned above, diffuse wall  thickening of the esophagus, severe wall thickening of the terminal ileum.  Concern for inflammatory bowel disease. -Consulted gastroenterology.Underwent EGD and colonoscopy with findings as above -CT enterography concerning for Crohn's disease.  Started on steroids. -T-max 100.5 -Most likely associated with inflammatory disease rather than infectious etiology. -Biopsies pending .ANCA and ASCA sent.  History of bipolar disorder -Patient is on multiple medications.Medications have been restarted as per psychiatry recommendations. -Psychiatry consulted. Continue medications per psychiatry recommendation. Follow-up outpatient..          DVT prophylaxis: Heparin [place] Code Status: Full Family Communication: None present at the bedside Disposition Plan: Home after GI clearance   Consultants: GI  Procedures:EGD/Colonoscopy  Antimicrobials:  Anti-infectives (From admission, onward)   Start     Dose/Rate Route Frequency Ordered Stop   01/15/19 0800  cefTRIAXone (ROCEPHIN) 1 g in sodium chloride 0.9 % 100 mL IVPB  Status:  Discontinued     1 g 200 mL/hr over 30 Minutes Intravenous Daily 01/15/19 0714 01/18/19 0915   01/14/19 1400  azithromycin (ZITHROMAX) 500 mg in sodium chloride 0.9 % 250 mL IVPB  Status:  Discontinued     500 mg 250 mL/hr over 60 Minutes Intravenous Every 24 hours 01/13/19 2330 01/18/19 0915   01/14/19 1000  cefTRIAXone (ROCEPHIN) 1 g in sodium chloride 0.9 % 100 mL IVPB  Status:  Discontinued     1 g 200 mL/hr over 30 Minutes Intravenous Every 24 hours 01/13/19 2330 01/14/19 0918   01/13/19 1515  cefTRIAXone (ROCEPHIN) 1 g in sodium chloride 0.9 % 100 mL IVPB     1 g 200 mL/hr over 30 Minutes Intravenous  Once 01/13/19 1502 01/13/19 1613   01/13/19 1515  azithromycin (ZITHROMAX) 500 mg in sodium chloride 0.9 % 250 mL IVPB     500 mg 250 mL/hr over 60 Minutes Intravenous  Once  01/13/19 1502 01/13/19 1655      Subjective:  Patient seen and examined at  bedside this morning.  Hemodynamically stable .He denies any abdominal pain, nausea or vomiting.  Complains of some abdominal bloating.  Respiratory status stable and is saturating fine on room air.  Objective: Vitals:   01/21/19 1715 01/21/19 1824 01/21/19 2158 01/22/19 0640  BP: (!) 104/58 107/61 111/69 (!) 99/57  Pulse: 83 82 83 72  Resp: 19 18 18 18   Temp: (!) 101.1 F (38.4 C) (!) 100.5 F (38.1 C) 99.4 F (37.4 C) 99 F (37.2 C)  TempSrc: Oral Oral    SpO2: 97% 99% 96% 96%  Weight:      Height:        Intake/Output Summary (Last 24 hours) at 01/22/2019 1150 Last data filed at 01/21/2019 1500 Gross per 24 hour  Intake 840 ml  Output 450 ml  Net 390 ml   Filed Weights   01/13/19 1400 01/20/19 0427 01/21/19 0902  Weight: 65 kg 59.7 kg 59.7 kg    Examination:  General exam: Appears calm and comfortable ,Not in distress,average built,stammers  HEENT:PERRL,Oral mucosa moist, Ear/Nose normal on gross exam Respiratory system: Bilateral equal air entry, normal vesicular breath sounds, no wheezes or crackles  Cardiovascular system: S1 & S2 heard, RRR. No JVD, murmurs, rubs, gallops or clicks. No pedal edema. Gastrointestinal system: Abdomen is nondistended, soft and nontender. No organomegaly or masses felt. Normal bowel sounds heard. Central nervous system: Alert and oriented. No focal neurological deficits. Extremities: No edema, no clubbing ,no cyanosis, distal peripheral pulses palpable. Skin: No rashes, lesions or ulcers,no icterus ,no pallor   Data Reviewed: I have personally reviewed following labs and imaging studies  CBC: Recent Labs  Lab 01/16/19 0015 01/17/19 0249 01/18/19 0213 01/20/19 0229  WBC 11.7* 7.0 4.3 7.4  NEUTROABS 10.8* 6.1 3.1  --   HGB 10.0* 9.3* 9.6* 9.8*  HCT 29.8* 27.7* 29.1* 31.3*  MCV 80.8 79.4* 80.8 81.3  PLT 294 279 292 740   Basic Metabolic Panel: Recent Labs  Lab 01/16/19 0015 01/17/19 0249 01/18/19 0213 01/20/19 0229  NA  136 136 138 133*  K 3.8 3.3* 4.0 3.7  CL 110 105 107 102  CO2 19* 23 24 22   GLUCOSE 131* 105* 105* 92  BUN 7 <5* <5* <5*  CREATININE 0.81 0.78 0.74 0.71  CALCIUM 7.5* 7.5* 7.6* 7.7*  MG 1.7 1.6* 2.0  --   PHOS 1.8* 2.4* 2.7  --    GFR: Estimated Creatinine Clearance: 107.8 mL/min (by C-G formula based on SCr of 0.71 mg/dL). Liver Function Tests: Recent Labs  Lab 01/16/19 0015 01/17/19 0249  AST 56* 73*  ALT 27 31  ALKPHOS 60 71  BILITOT 0.6 0.5  PROT 4.8* 4.6*  ALBUMIN 1.8* 1.7*   No results for input(s): LIPASE, AMYLASE in the last 168 hours. No results for input(s): AMMONIA in the last 168 hours. Coagulation Profile: No results for input(s): INR, PROTIME in the last 168 hours. Cardiac Enzymes: No results for input(s): CKTOTAL, CKMB, CKMBINDEX, TROPONINI in the last 168 hours. BNP (last 3 results) No results for input(s): PROBNP in the last 8760 hours. HbA1C: No results for input(s): HGBA1C in the last 72 hours. CBG: Recent Labs  Lab 01/21/19 1709 01/21/19 2157 01/21/19 2253 01/22/19 0802 01/22/19 1149  GLUCAP 99 102* 102* 141* 137*   Lipid Profile: No results for input(s): CHOL, HDL, LDLCALC, TRIG, CHOLHDL, LDLDIRECT in the last 72 hours. Thyroid Function Tests:  No results for input(s): TSH, T4TOTAL, FREET4, T3FREE, THYROIDAB in the last 72 hours. Anemia Panel: No results for input(s): VITAMINB12, FOLATE, FERRITIN, TIBC, IRON, RETICCTPCT in the last 72 hours. Sepsis Labs: Recent Labs  Lab 01/16/19 0015  PROCALCITON 3.30  LATICACIDVEN 0.9    Recent Results (from the past 240 hour(s))  Culture, blood (routine x 2)     Status: None   Collection Time: 01/13/19  3:27 PM   Specimen: BLOOD RIGHT HAND  Result Value Ref Range Status   Specimen Description BLOOD RIGHT HAND  Final   Special Requests   Final    BOTTLES DRAWN AEROBIC AND ANAEROBIC Blood Culture results may not be optimal due to an inadequate volume of blood received in culture bottles   Culture    Final    NO GROWTH 5 DAYS Performed at [facility], [place] [address], [place], [place] [address]    Report Status 01/18/2019 FINAL  Final  SARS Coronavirus 2 [facility] order, Performed in [facility] hospital lab) Nasopharyngeal Nasopharyngeal Swab     Status: None   Collection Time: 01/13/19  3:37 PM   Specimen: Nasopharyngeal Swab  Result Value Ref Range Status   SARS Coronavirus 2 NEGATIVE NEGATIVE Final    Comment: (NOTE) If result is NEGATIVE SARS-CoV-2 target nucleic acids are NOT DETECTED. The SARS-CoV-2 RNA is generally detectable in upper and lower  respiratory specimens during the acute phase of infection. The lowest  concentration of SARS-CoV-2 viral copies this assay can detect is 250  copies / mL. A negative result does not preclude SARS-CoV-2 infection  and should not be used as the sole basis for treatment or other  patient management decisions.  A negative result may occur with  improper specimen collection / handling, submission of specimen other  than nasopharyngeal swab, presence of viral mutation(s) within the  areas targeted by this assay, and inadequate number of viral copies  (<250 copies / mL). A negative result must be combined with clinical  observations, patient history, and epidemiological information. If result is POSITIVE SARS-CoV-2 target nucleic acids are DETECTED. The SARS-CoV-2 RNA is generally detectable in upper and lower  respiratory specimens dur ing the acute phase of infection.  Positive  results are indicative of active infection with SARS-CoV-2.  Clinical  correlation with patient history and other diagnostic information is  necessary to determine patient infection status.  Positive results do  not rule out bacterial infection or co-infection with other viruses. If result is PRESUMPTIVE POSTIVE SARS-CoV-2 nucleic acids MAY BE PRESENT.   A presumptive positive result was obtained on the submitted specimen  and confirmed on repeat  testing.  While 2019 novel coronavirus  (SARS-CoV-2) nucleic acids may be present in the submitted sample  additional confirmatory testing may be necessary for epidemiological  and / or clinical management purposes  to differentiate between  SARS-CoV-2 and other Sarbecovirus currently known to infect humans.  If clinically indicated additional testing with an alternate test  methodology [phone]) is advised. The SARS-CoV-2 RNA is generally  detectable in upper and lower respiratory sp ecimens during the acute  phase of infection. The expected result is Negative. Fact Sheet for Patients:  StrictlyIdeas.no Fact Sheet for Healthcare Providers: BankingDealers.co.za This test is not yet approved or cleared by the [place] FDA and has been authorized for detection and/or diagnosis of SARS-CoV-2 by FDA under an Emergency Use Authorization (EUA).  This EUA will remain in effect (meaning this test can be used) for  the duration of the COVID-19 declaration under Section 564(b)(1) of the Act, 21 U.S.C. section 360bbb-3(b)(1), unless the authorization is terminated or revoked sooner. Performed at [facility], [place] [address] [place], [place] [address]   Urine culture     Status: Abnormal   Collection Time: 01/13/19  4:56 PM   Specimen: In/Out Cath Urine  Result Value Ref Range Status   Specimen Description IN/OUT CATH URINE  Final   Special Requests   Final    NONE Performed at [facility], [place] [address], [place], [place] [address]    Culture (A)  Final    60,000 COLONIES/mL MULTIPLE SPECIES PRESENT, SUGGEST RECOLLECTION   Report Status 01/15/2019 FINAL  Final  C difficile quick scan w PCR reflex     Status: None   Collection Time: 01/13/19  5:35 PM   Specimen: STOOL  Result Value Ref Range Status   C Diff antigen NEGATIVE NEGATIVE Final   C Diff toxin NEGATIVE NEGATIVE Final   C Diff interpretation No C. difficile  detected.  Final    Comment: Performed at [facility], [place] [address] [place], [place] [address]  Gastrointestinal Panel by PCR , Stool     Status: None   Collection Time: 01/13/19  5:36 PM   Specimen: STOOL  Result Value Ref Range Status   Campylobacter species NOT DETECTED NOT DETECTED Final   Plesimonas shigelloides NOT DETECTED NOT DETECTED Final   Salmonella species NOT DETECTED NOT DETECTED Final   Yersinia enterocolitica NOT DETECTED NOT DETECTED Final   Vibrio species NOT DETECTED NOT DETECTED Final   Vibrio cholerae NOT DETECTED NOT DETECTED Final   Enteroaggregative E coli (EAEC) NOT DETECTED NOT DETECTED Final   Enteropathogenic E coli (EPEC) NOT DETECTED NOT DETECTED Final   Enterotoxigenic E coli (ETEC) NOT DETECTED NOT DETECTED Final   Shiga like toxin producing E coli (STEC) NOT DETECTED NOT DETECTED Final   Shigella/Enteroinvasive E coli (EIEC) NOT DETECTED NOT DETECTED Final   Cryptosporidium NOT DETECTED NOT DETECTED Final   Cyclospora cayetanensis NOT DETECTED NOT DETECTED Final   Entamoeba histolytica NOT DETECTED NOT DETECTED Final   Giardia lamblia NOT DETECTED NOT DETECTED Final   Adenovirus F40/41 NOT DETECTED NOT DETECTED Final   Astrovirus NOT DETECTED NOT DETECTED Final   Norovirus GI/GII NOT DETECTED NOT DETECTED Final   Rotavirus A NOT DETECTED NOT DETECTED Final   Sapovirus (I, II, IV, and V) NOT DETECTED NOT DETECTED Final    Comment: Performed at [facility], [place] [place], [place] [address]  Culture, blood (routine x 2)     Status: None   Collection Time: 01/13/19  7:30 PM   Specimen: BLOOD RIGHT ARM  Result Value Ref Range Status   Specimen Description BLOOD RIGHT ARM  Final   Special Requests   Final    BOTTLES DRAWN AEROBIC AND ANAEROBIC Blood Culture results may not be optimal due to an inadequate volume of blood received in culture bottles   Culture   Final    NO GROWTH 5 DAYS Performed at [facility],  [address] [place], [place] [address]    Report Status 01/18/2019 FINAL  Final  MRSA PCR Screening     Status: None   Collection Time: 01/14/19  2:01 AM   Specimen: Nasal Mucosa; Nasopharyngeal  Result Value Ref Range Status   MRSA by PCR NEGATIVE NEGATIVE Final    Comment:  The GeneXpert MRSA Assay (FDA approved for NASAL specimens only), is one component of a comprehensive MRSA colonization surveillance program. It is not intended to diagnose MRSA infection nor to guide or monitor treatment for MRSA infections. Performed at [facility], [place] [address], [place], [place] [address]   Respiratory Panel by PCR     Status: None   Collection Time: 01/14/19  9:14 AM   Specimen: Nasopharyngeal Swab; Respiratory  Result Value Ref Range Status   Adenovirus NOT DETECTED NOT DETECTED Final   Coronavirus 229E NOT DETECTED NOT DETECTED Final    Comment: (NOTE) The Coronavirus on the Respiratory Panel, DOES NOT test for the novel  Coronavirus (2019 nCoV)    Coronavirus HKU1 NOT DETECTED NOT DETECTED Final   Coronavirus NL63 NOT DETECTED NOT DETECTED Final   Coronavirus OC43 NOT DETECTED NOT DETECTED Final   Metapneumovirus NOT DETECTED NOT DETECTED Final   Rhinovirus / Enterovirus NOT DETECTED NOT DETECTED Final   Influenza A NOT DETECTED NOT DETECTED Final   Influenza B NOT DETECTED NOT DETECTED Final   Parainfluenza Virus 1 NOT DETECTED NOT DETECTED Final   Parainfluenza Virus 2 NOT DETECTED NOT DETECTED Final   Parainfluenza Virus 3 NOT DETECTED NOT DETECTED Final   Parainfluenza Virus 4 NOT DETECTED NOT DETECTED Final   Respiratory Syncytial Virus NOT DETECTED NOT DETECTED Final   Bordetella pertussis NOT DETECTED NOT DETECTED Final   Chlamydophila pneumoniae NOT DETECTED NOT DETECTED Final   Mycoplasma pneumoniae NOT DETECTED NOT DETECTED Final    Comment: Performed at [facility], [place]. [address], [place], [place] [address]  Culture, blood (Routine X 2) w  Reflex to ID Panel     Status: None   Collection Time: 01/14/19  9:23 PM   Specimen: BLOOD  Result Value Ref Range Status   Specimen Description BLOOD RIGHT ARM  Final   Special Requests   Final    BOTTLES DRAWN AEROBIC AND ANAEROBIC Blood Culture adequate volume   Culture   Final    NO GROWTH 5 DAYS Performed at [facility], [address] [place], [place] [address]    Report Status 01/19/2019 FINAL  Final  Culture, blood (Routine X 2) w Reflex to ID Panel     Status: None   Collection Time: 01/14/19  9:23 PM   Specimen: BLOOD  Result Value Ref Range Status   Specimen Description BLOOD LEFT HAND  Final   Special Requests   Final    BOTTLES DRAWN AEROBIC AND ANAEROBIC Blood Culture adequate volume   Culture   Final    NO GROWTH 5 DAYS Performed at [facility], [place] [address] [place], [place] [address]    Report Status 01/19/2019 FINAL  Final  SARS CORONAVIRUS 2 (TAT 6-24 HRS) Nasopharyngeal Nasopharyngeal Swab     Status: None   Collection Time: 01/16/19  5:20 PM   Specimen: Nasopharyngeal Swab  Result Value Ref Range Status   SARS Coronavirus 2 NEGATIVE NEGATIVE Final    Comment: (NOTE) SARS-CoV-2 target nucleic acids are NOT DETECTED. The SARS-CoV-2 RNA is generally detectable in upper and lower respiratory specimens during the acute phase of infection. Negative results do not preclude SARS-CoV-2 infection, do not rule out co-infections with other pathogens, and should not be used as the sole basis for treatment or other patient management decisions. Negative results must be combined with clinical observations, patient history, and epidemiological information. The expected result is Negative. Fact Sheet for  Patients: SugarRoll.be Fact Sheet for Healthcare Providers: https://www.woods-mathews.com/ This test is not yet approved or cleared by the [place] FDA and  has been authorized for detection and/or diagnosis  of SARS-CoV-2 by FDA under an Emergency Use Authorization (EUA). This EUA will remain  in effect (meaning this test can be used) for the duration of the COVID-19 declaration under Section 56 4(b)(1) of the Act, 21 U.S.C. section 360bbb-3(b)(1), unless the authorization is terminated or revoked sooner. Performed at [facility], [place] [address] [place], [place] [address]          Radiology Studies: Ct Diannia Ruder Contast  Result Date: 01/21/2019 CLINICAL DATA:  Inflammatory bowel disease EXAM: CT ABDOMEN AND PELVIS WITH CONTRAST (ENTEROGRAPHY) TECHNIQUE: Multidetector CT of the abdomen and pelvis during bolus administration of intravenous contrast. Negative oral contrast was given. CONTRAST:  175m OMNIPAQUE IOHEXOL 300 MG/ML  SOLN COMPARISON:  CT chest abdomen pelvis, 01/16/2019 FINDINGS: Lower chest: There is extensive, right greater than left bibasilar heterogeneous and consolidative airspace disease, which is worsened in comparison to recent CT dated 01/16/2019. Hepatobiliary: No solid liver abnormality is seen. No gallstones, gallbladder wall thickening, or biliary dilatation. Pancreas: Unremarkable. No pancreatic ductal dilatation or surrounding inflammatory changes. Spleen: Normal in size without significant abnormality. Adrenals/Urinary Tract: Adrenal glands are unremarkable. Kidneys are normal, without renal calculi, solid lesion, or hydronephrosis. Bladder is unremarkable. Stomach/Bowel: Stomach is within normal limits. Appendix appears normal. The small bowel and colon are fluid-filled by negative contrast to the rectum without overt distention. There is redemonstrated thickening and luminal narrowing of two segments of terminal ileum involving the distal ~30 cm of the ileum separated by a short segment of relatively normal appearing bowel, this overall appearance with somewhat fixed, tethered configuration in comparison to prior examination (series 3, image 51, series 6,  image 66, 75). Vascular/Lymphatic: No significant vascular findings are present. No enlarged abdominal or pelvic lymph nodes. Reproductive: No mass or other significant abnormality. Other: No abdominal wall hernia or abnormality. Small volume free fluid in the right lower quadrant and low pelvis. Musculoskeletal: There is sclerosis of the bilateral sacroiliac joints (series 4, image 157). IMPRESSION: 1. There is redemonstrated thickening and luminal narrowing of two segments of terminal ileum involving the distal ~30 cm of the ileum separated by a short segment of relatively normal appearing bowel, this overall appearance with somewhat fixed, tethered configuration in comparison to prior examination (series 3, image 51, series 6, image 66, 75). Findings are consistent with infectious or inflammatory enteritis, particularly Crohn's disease given this appearance and distribution. The involved segments are suspicious for stricture although there is no evidence of overt bowel obstruction. No evidence of fistula or complication by abscess formation. 2. The remainder of the bowel is normal in appearance, fluid filled by negative enteric contrast. 3. Small volume free fluid in the right lower quadrant and low pelvis, likely reactive. 4. There is extensive, right greater than left bibasilar heterogeneous and consolidative airspace disease, which is worsened in comparison to recent CT dated 01/16/2019 and consistent with multifocal infection. 5. There is sclerosis of the bilateral sacroiliac joints (series 4, image 157), advanced for patient age and consistent with inflammatory sacroiliitis, as can be seen in association with inflammatory bowel disease. Electronically Signed   By: AEddie CandleM.D.   On: 01/21/2019 14:19        Scheduled Meds:  ARIPiprazole  10 mg Oral QHS   benztropine  1 mg Oral Daily   Chlorhexidine  Gluconate Cloth  6 each Topical Daily   citalopram  20 mg Oral QHS   heparin  5,000 Units  Subcutaneous Q8H   influenza vac split quadrivalent PF  0.5 mL Intramuscular Tomorrow-1000   insulin aspart  0-5 Units Subcutaneous QHS   insulin aspart  0-9 Units Subcutaneous TID WC   methylPREDNISolone (SOLU-MEDROL) injection  60 mg Intravenous Q24H   polyethylene glycol-electrolytes  2,000 mL Oral Once   polyethylene glycol-electrolytes  4,000 mL Oral Once   sodium chloride flush  3 mL Intravenous Q12H   traZODone  200 mg Oral QHS   Continuous Infusions:  sodium chloride 125 mL/hr at 01/16/19 0118     LOS: 9 days    Time spent: 35 mins.More than 50% of that time was spent in counseling and/or coordination of care.      Shelly Coss, MD [facility] Pager [phone]  If 7PM-7AM, please contact night-coverage [email or web address] Password [facility] 01/22/2019, 11:50 AM

## 2019-01-23 DIAGNOSIS — G4700 Insomnia, unspecified: Secondary | ICD-10-CM

## 2019-01-23 LAB — MPO/PR-3 (ANCA) ANTIBODIES
ANCA Proteinase 3: 8.7 U/mL — ABNORMAL HIGH (ref 0.0–3.5)
Myeloperoxidase Abs: <9 U/mL (ref 0.0–9.0)

## 2019-01-23 LAB — CBC WITH DIFFERENTIAL/PLATELET
Abs Immature Granulocytes: 0.03 10*3/uL (ref 0.00–0.07)
Basophils Absolute: 0 10*3/uL (ref 0.0–0.1)
Basophils Relative: 1 %
Eosinophils Absolute: 0.1 10*3/uL (ref 0.0–0.5)
Eosinophils Relative: 1 %
HCT: 34.2 % — ABNORMAL LOW (ref 39.0–52.0)
Hemoglobin: 10.7 g/dL — ABNORMAL LOW (ref 13.0–17.0)
Immature Granulocytes: 1 %
Lymphocytes Relative: 8 %
Lymphs Abs: 0.5 10*3/uL — ABNORMAL LOW (ref 0.7–4.0)
MCH: 25.4 pg — ABNORMAL LOW (ref 26.0–34.0)
MCHC: 31.3 g/dL (ref 30.0–36.0)
MCV: 81 fL (ref 80.0–100.0)
Monocytes Absolute: 0.7 10*3/uL (ref 0.1–1.0)
Monocytes Relative: 11 %
Neutro Abs: 5.1 10*3/uL (ref 1.7–7.7)
Neutrophils Relative %: 78 %
Platelets: 570 10*3/uL — ABNORMAL HIGH (ref 150–400)
RBC: 4.22 MIL/uL (ref 4.22–5.81)
RDW: 14.5 % (ref 11.5–15.5)
WBC: 6.5 10*3/uL (ref 4.0–10.5)
nRBC: 0 % (ref 0.0–0.2)

## 2019-01-23 LAB — ANCA TITERS
Atypical P-ANCA titer: <1:20 {titer}
C-ANCA: <1:20 {titer}
P-ANCA: <1:20 {titer}

## 2019-01-23 LAB — BASIC METABOLIC PANEL
Anion gap: 7 (ref 5–15)
BUN: 6 mg/dL (ref 6–20)
CO2: 22 mmol/L (ref 22–32)
Calcium: 8 mg/dL — ABNORMAL LOW (ref 8.9–10.3)
Chloride: 106 mmol/L (ref 98–111)
Creatinine, Ser: 0.68 mg/dL (ref 0.61–1.24)
GFR calc Af Amer: >60 mL/min (ref >60)
GFR calc non Af Amer: >60 mL/min (ref >60)
Glucose, Bld: 96 mg/dL (ref 70–99)
Potassium: 3.7 mmol/L (ref 3.5–5.1)
Sodium: 135 mmol/L (ref 135–145)

## 2019-01-23 LAB — GLUCOSE, CAPILLARY
Glucose-Capillary: 88 mg/dL (ref 70–99)
Glucose-Capillary: 128 mg/dL — ABNORMAL HIGH (ref 70–99)
Glucose-Capillary: 111 mg/dL — ABNORMAL HIGH (ref 70–99)
Glucose-Capillary: 118 mg/dL — ABNORMAL HIGH (ref 70–99)

## 2019-01-23 LAB — SURGICAL PATHOLOGY

## 2019-01-23 NOTE — Progress Notes (Signed)
[facility] Infectious [facility]   Reason for visit: Follow up on fever  Interval History: no fever, eating better, less stool output by his report.  Having trouble sleeping.  WBC wnl.     Physical Exam: Constitutional:  Vitals:   01/22/19 2120 01/23/19 0513  BP: (!) 110/59 (!) 95/55  Pulse: (!) 59 (!) 55  Resp:    Temp: 98.7 F (37.1 C) 98.7 F (37.1 C)  SpO2: 98% 97%   patient appears in NAD Eyes: anicteric Respiratory: Normal respiratory effort; CTA B Cardiovascular: RRR GI: soft, nt, nd  Review of Systems: Constitutional: negative for weight loss Gastrointestinal: positive for diarrhea, negative for nausea and vomiting Integument/breast: negative for rash  Lab Results  Component Value Date   WBC 6.5 01/23/2019   HGB 10.7 (L) 01/23/2019   HCT 34.2 (L) 01/23/2019   MCV 81.0 01/23/2019   PLT 570 (H) 01/23/2019    Lab Results  Component Value Date   CREATININE 0.68 01/23/2019   BUN 6 01/23/2019   NA 135 01/23/2019   K 3.7 01/23/2019   CL 106 01/23/2019   CO2 22 01/23/2019    Lab Results  Component Value Date   ALT 31 01/17/2019   AST 73 (H) 01/17/2019   ALKPHOS 71 01/17/2019     Microbiology: Recent Results (from the past 240 hour(s))  Culture, blood (routine x 2)     Status: None   Collection Time: 01/13/19  3:27 PM   Specimen: BLOOD RIGHT HAND  Result Value Ref Range Status   Specimen Description BLOOD RIGHT HAND  Final   Special Requests   Final    BOTTLES DRAWN AEROBIC AND ANAEROBIC Blood Culture results may not be optimal due to an inadequate volume of blood received in culture bottles   Culture   Final    NO GROWTH 5 DAYS Performed at [facility], [place] [address] [place], [place] [address]    Report Status 01/18/2019 FINAL  Final  SARS Coronavirus 2 [facility] order, Performed in [facility] hospital lab) Nasopharyngeal Nasopharyngeal Swab     Status: None   Collection Time: 01/13/19  3:37 PM   Specimen: Nasopharyngeal Swab   Result Value Ref Range Status   SARS Coronavirus 2 NEGATIVE NEGATIVE Final    Comment: (NOTE) If result is NEGATIVE SARS-CoV-2 target nucleic acids are NOT DETECTED. The SARS-CoV-2 RNA is generally detectable in upper and lower  respiratory specimens during the acute phase of infection. The lowest  concentration of SARS-CoV-2 viral copies this assay can detect is 250  copies / mL. A negative result does not preclude SARS-CoV-2 infection  and should not be used as the sole basis for treatment or other  patient management decisions.  A negative result may occur with  improper specimen collection / handling, submission of specimen other  than nasopharyngeal swab, presence of viral mutation(s) within the  areas targeted by this assay, and inadequate number of viral copies  (<250 copies / mL). A negative result must be combined with clinical  observations, patient history, and epidemiological information. If result is POSITIVE SARS-CoV-2 target nucleic acids are DETECTED. The SARS-CoV-2 RNA is generally detectable in upper and lower  respiratory specimens dur ing the acute phase of infection.  Positive  results are indicative of active infection with SARS-CoV-2.  Clinical  correlation with patient history and other diagnostic information is  necessary to determine patient infection status.  Positive results do  not rule out bacterial infection  or co-infection with other viruses. If result is PRESUMPTIVE POSTIVE SARS-CoV-2 nucleic acids MAY BE PRESENT.   A presumptive positive result was obtained on the submitted specimen  and confirmed on repeat testing.  While 2019 novel coronavirus  (SARS-CoV-2) nucleic acids may be present in the submitted sample  additional confirmatory testing may be necessary for epidemiological  and / or clinical management purposes  to differentiate between  SARS-CoV-2 and other Sarbecovirus currently known to infect humans.  If clinically indicated additional  testing with an alternate test  methodology [phone]) is advised. The SARS-CoV-2 RNA is generally  detectable in upper and lower respiratory sp ecimens during the acute  phase of infection. The expected result is Negative. Fact Sheet for Patients:  StrictlyIdeas.no Fact Sheet for Healthcare Providers: BankingDealers.co.za This test is not yet approved or cleared by the [place] FDA and has been authorized for detection and/or diagnosis of SARS-CoV-2 by FDA under an Emergency Use Authorization (EUA).  This EUA will remain in effect (meaning this test can be used) for the duration of the COVID-19 declaration under Section 564(b)(1) of the Act, 21 U.S.C. section 360bbb-3(b)(1), unless the authorization is terminated or revoked sooner. Performed at [facility], [place] [address] [place], [place] [address]   Urine culture     Status: Abnormal   Collection Time: 01/13/19  4:56 PM   Specimen: In/Out Cath Urine  Result Value Ref Range Status   Specimen Description IN/OUT CATH URINE  Final   Special Requests   Final    NONE Performed at [facility], [place] [address] [place], [place] [address]    Culture (A)  Final    60,000 COLONIES/mL MULTIPLE SPECIES PRESENT, SUGGEST RECOLLECTION   Report Status 01/15/2019 FINAL  Final  C difficile quick scan w PCR reflex     Status: None   Collection Time: 01/13/19  5:35 PM   Specimen: STOOL  Result Value Ref Range Status   C Diff antigen NEGATIVE NEGATIVE Final   C Diff toxin NEGATIVE NEGATIVE Final   C Diff interpretation No C. difficile detected.  Final    Comment: Performed at [facility], [place] [address] [place], [place] [address]  Gastrointestinal Panel by PCR , Stool     Status: None   Collection Time: 01/13/19  5:36 PM   Specimen: STOOL  Result Value Ref Range Status   Campylobacter species NOT DETECTED NOT DETECTED Final   Plesimonas shigelloides NOT DETECTED NOT  DETECTED Final   Salmonella species NOT DETECTED NOT DETECTED Final   Yersinia enterocolitica NOT DETECTED NOT DETECTED Final   Vibrio species NOT DETECTED NOT DETECTED Final   Vibrio cholerae NOT DETECTED NOT DETECTED Final   Enteroaggregative E coli (EAEC) NOT DETECTED NOT DETECTED Final   Enteropathogenic E coli (EPEC) NOT DETECTED NOT DETECTED Final   Enterotoxigenic E coli (ETEC) NOT DETECTED NOT DETECTED Final   Shiga like toxin producing E coli (STEC) NOT DETECTED NOT DETECTED Final   Shigella/Enteroinvasive E coli (EIEC) NOT DETECTED NOT DETECTED Final   Cryptosporidium NOT DETECTED NOT DETECTED Final   Cyclospora cayetanensis NOT DETECTED NOT DETECTED Final   Entamoeba histolytica NOT DETECTED NOT DETECTED Final   Giardia lamblia NOT DETECTED NOT DETECTED Final   Adenovirus F40/41 NOT DETECTED NOT DETECTED Final   Astrovirus NOT DETECTED NOT DETECTED Final   Norovirus GI/GII NOT DETECTED NOT DETECTED Final   Rotavirus A NOT DETECTED NOT DETECTED Final   Sapovirus (I, II, IV,  and V) NOT DETECTED NOT DETECTED Final    Comment: Performed at [facility], [place] [place], [place] [address]  Culture, blood (routine x 2)     Status: None   Collection Time: 01/13/19  7:30 PM   Specimen: BLOOD RIGHT ARM  Result Value Ref Range Status   Specimen Description BLOOD RIGHT ARM  Final   Special Requests   Final    BOTTLES DRAWN AEROBIC AND ANAEROBIC Blood Culture results may not be optimal due to an inadequate volume of blood received in culture bottles   Culture   Final    NO GROWTH 5 DAYS Performed at [facility], [place] [address], [place], [place] [address]    Report Status 01/18/2019 FINAL  Final  MRSA PCR Screening     Status: None   Collection Time: 01/14/19  2:01 AM   Specimen: Nasal Mucosa; Nasopharyngeal  Result Value Ref Range Status   MRSA by PCR NEGATIVE NEGATIVE Final    Comment:        The GeneXpert MRSA Assay (FDA approved for NASAL specimens  only), is one component of a comprehensive MRSA colonization surveillance program. It is not intended to diagnose MRSA infection nor to guide or monitor treatment for MRSA infections. Performed at [facility], [place] [address], [place], [place] [address]   Respiratory Panel by PCR     Status: None   Collection Time: 01/14/19  9:14 AM   Specimen: Nasopharyngeal Swab; Respiratory  Result Value Ref Range Status   Adenovirus NOT DETECTED NOT DETECTED Final   Coronavirus 229E NOT DETECTED NOT DETECTED Final    Comment: (NOTE) The Coronavirus on the Respiratory Panel, DOES NOT test for the novel  Coronavirus (2019 nCoV)    Coronavirus HKU1 NOT DETECTED NOT DETECTED Final   Coronavirus NL63 NOT DETECTED NOT DETECTED Final   Coronavirus OC43 NOT DETECTED NOT DETECTED Final   Metapneumovirus NOT DETECTED NOT DETECTED Final   Rhinovirus / Enterovirus NOT DETECTED NOT DETECTED Final   Influenza A NOT DETECTED NOT DETECTED Final   Influenza B NOT DETECTED NOT DETECTED Final   Parainfluenza Virus 1 NOT DETECTED NOT DETECTED Final   Parainfluenza Virus 2 NOT DETECTED NOT DETECTED Final   Parainfluenza Virus 3 NOT DETECTED NOT DETECTED Final   Parainfluenza Virus 4 NOT DETECTED NOT DETECTED Final   Respiratory Syncytial Virus NOT DETECTED NOT DETECTED Final   Bordetella pertussis NOT DETECTED NOT DETECTED Final   Chlamydophila pneumoniae NOT DETECTED NOT DETECTED Final   Mycoplasma pneumoniae NOT DETECTED NOT DETECTED Final    Comment: Performed at [facility], [place], [place], [place] [address]  Culture, blood (Routine X 2) w Reflex to ID Panel     Status: None   Collection Time: 01/14/19  9:23 PM   Specimen: BLOOD  Result Value Ref Range Status   Specimen Description BLOOD RIGHT ARM  Final   Special Requests   Final    BOTTLES DRAWN AEROBIC AND ANAEROBIC Blood Culture adequate volume   Culture   Final    NO GROWTH 5 DAYS Performed at [facility], [address], [place], [place] [address]    Report Status 01/19/2019 FINAL  Final  Culture, blood (Routine X 2) w Reflex to ID Panel     Status: None   Collection Time: 01/14/19  9:23 PM   Specimen: BLOOD  Result Value Ref Range Status   Specimen Description BLOOD LEFT  HAND  Final   Special Requests   Final    BOTTLES DRAWN AEROBIC AND ANAEROBIC Blood Culture adequate volume   Culture   Final    NO GROWTH 5 DAYS Performed at [facility], [address] [place], [place] [address]    Report Status 01/19/2019 FINAL  Final  SARS CORONAVIRUS 2 (TAT 6-24 HRS) Nasopharyngeal Nasopharyngeal Swab     Status: None   Collection Time: 01/16/19  5:20 PM   Specimen: Nasopharyngeal Swab  Result Value Ref Range Status   SARS Coronavirus 2 NEGATIVE NEGATIVE Final    Comment: (NOTE) SARS-CoV-2 target nucleic acids are NOT DETECTED. The SARS-CoV-2 RNA is generally detectable in upper and lower respiratory specimens during the acute phase of infection. Negative results do not preclude SARS-CoV-2 infection, do not rule out co-infections with other pathogens, and should not be used as the sole basis for treatment or other patient management decisions. Negative results must be combined with clinical observations, patient history, and epidemiological information. The expected result is Negative. Fact Sheet for Patients: SugarRoll.be Fact Sheet for Healthcare Providers: https://www.woods-mathews.com/ This test is not yet approved or cleared by the [place] FDA and  has been authorized for detection and/or diagnosis of SARS-CoV-2 by FDA under an Emergency Use Authorization (EUA). This EUA will remain  in effect (meaning this test can be used) for the duration of the COVID-19 declaration under Section 56 4(b)(1) of the Act, 21 U.S.C. section 360bbb-3(b)(1), unless the authorization is terminated or revoked sooner. Performed at [facility], [place] [address], [place], [place] [address]     Impression/Plan:  1. Fever - no fever now on steroids.  No signs of infection.   2.  Diarrhea - improved on steroid treatment.  Pathology pending for ? Crohn's disease.    3.  Insomnia - patient having more difficulty sleeping.  Will defer to primary team.    I will sign off, call with any questions.

## 2019-01-23 NOTE — Progress Notes (Signed)
Physical Therapy Treatment Patient Details Name: Carl Carey MRN: [id number] DOB: 02/19/1981 Today's Date: 01/23/2019    History of Present Illness 38 year old male with history of bipolar disorder presented on 01/13/2019 with shortness of breath and respiratory distress along with altered mental status.  He was apparently found by family, lethargic but vomiting.  Patient had recent admission to [facility] for gastroenteritis and hyponatremia.  Admitted for CAP.    PT Comments    Patient seen for mobility progression. Pt is mod I/I for OOB mobility without AD. Current plan remains appropriate.   Follow Up Recommendations  Supervision - Intermittent;No PT follow up     Equipment Recommendations  None recommended by PT    Recommendations for Other Services Other (comment)(social worker consult)     Precautions / Restrictions Precautions Precautions: Fall Restrictions Weight Bearing Restrictions: No    Mobility  Bed Mobility Overal bed mobility: Independent                Transfers Overall transfer level: Independent Equipment used: None Transfers: Sit to/from Stand              Ambulation/Gait Ambulation/Gait assistance: Modified independent (Device/Increase time) Gait Distance (Feet): 450 Feet Assistive device: None Gait Pattern/deviations: Step-through pattern     General Gait Details: decreased cadence; distracted by exceptionally busy hallway   Stairs             Wheelchair Mobility    Modified Rankin (Stroke Patients Only)       Balance     Sitting balance-Leahy Scale: Normal       Standing balance-Leahy Scale: Good                              Cognition Arousal/Alertness: Awake/alert Behavior During Therapy: WFL for tasks assessed/performed Overall Cognitive Status: No family/caregiver present to determine baseline cognitive functioning                                 General Comments: WFL  for mobility tasks during session      Exercises      General Comments        Pertinent Vitals/Pain Pain Assessment: No/denies pain    Home Living                      Prior Function            PT Goals (current goals can now be found in the care plan section) Progress towards PT goals: Progressing toward goals    Frequency    Min 3X/week      PT Plan Current plan remains appropriate    Co-evaluation              AM-PAC PT "6 Clicks" Mobility   Outcome Measure  Help needed turning from your back to your side while in a flat bed without using bedrails?: None Help needed moving from lying on your back to sitting on the side of a flat bed without using bedrails?: None Help needed moving to and from a bed to a chair (including a wheelchair)?: None Help needed standing up from a chair using your arms (e.g., wheelchair or bedside chair)?: None Help needed to walk in hospital room?: None Help needed climbing 3-5 steps with a railing? : A Little 6 Click Score: 23    End of  Session   Activity Tolerance: Patient tolerated treatment well Patient left: in bed;with call bell/phone within reach Nurse Communication: Mobility status PT Visit Diagnosis: Other abnormalities of gait and mobility (R26.89);Other symptoms and signs involving the nervous system (R29.898)     Time: 4097-9641 PT Time Calculation (min) (ACUTE ONLY): 22 min  Charges:  $Gait Training: 8-22 mins                     Earney Navy, PTA Acute Rehabilitation Services Pager: [phone] Office: [phone]     Darliss Cheney 01/23/2019, 4:26 PM

## 2019-01-23 NOTE — Progress Notes (Addendum)
PROGRESS NOTE    Carl Carey  BWL:[id number] DOB: 31-Mar-1981 DOA: 01/13/2019 PCP: Patient, No Pcp Per   Brief Narrative: 38 year old male with history of bipolar disorder presented on 01/13/2019 with shortness of breath and respiratory distress along with altered mental status. He was apparently found by family, lethargic but vomiting. Patient had recent admission to [facility] for gastroenteritis and hyponatremia and reportedly had enteritis on CT scan at that time with negative stool culture/stool for C. difficile and GI PCR. On presentation, he was found to have elevated lactic with leukocytosis. He was started on IV fluids and broad-spectrum antibiotics for possible pneumonia. Now off antibiotics .  Patient underwent EGD and colonoscopy on 01/21/19.  EGD revealed esophagitis, gastric and pyloric ulcers.  Colonoscopy revealed terminal ileal stenosis and scattered rectal ulcers.   CT enterography was suggestive  of Crohn's disease.  Started on IV steroids.   Assessment & Plan:   Principal Problem:   Bipolar disorder, in partial remission, most recent episode manic ([place]) Active Problems:   Septic shock (HCC)   Community-acquired pneumonia Acute hypoxic respiratory failure: RequiredBiPAP initially -Completed course of Rocephin and Zithromax. -Blood cultures negative.Urine streptococcal antigen negative. Respiratory panel PCR negative. COVID-19 test negative. -Urine Legionella antigennegative. --2D echo showed EF of 50 to 55% with no valvular vegetation visualized. - Repeat CT of the chest abdomen and pelvison 01/16/2019 showed multiple lung opacities bilaterally, predominantly right lung consistent with multifocal pneumonia. -Respiratory status stable now. On room air  Abdominal pain/diarrhea/Chron's disease -Negative work-up so far including stool for C. difficile/GI PCR and cultures. Had recent CT which showed multiple lung opacities as mentioned above, diffuse wall  thickening of the esophagus, severe wall thickening of the terminal ileum.  Concern for inflammatory bowel disease. -Consulted gastroenterology.Underwent EGD and colonoscopy with findings as above -CT enterography concerning for Crohn's disease.  Started on steroids. -Fever was most likely associated with inflammatory disease rather than infectious etiology.He is afebrile today. -Biopsies pending .ANCA and ASCA sent.  History of bipolar disorder -Patient is on multiple medications.Medications have been restarted as per psychiatry recommendations. -Psychiatry consulted. Continue medications per psychiatry recommendation. Follow-up outpatient..          DVT prophylaxis: Heparin Kiefer Code Status: Full Family Communication: Called mother,phone not received Disposition Plan: Home after GI clearance   Consultants: GI  Procedures:EGD/Colonoscopy  Antimicrobials:  Anti-infectives (From admission, onward)   Start     Dose/Rate Route Frequency Ordered Stop   01/15/19 0800  cefTRIAXone (ROCEPHIN) 1 g in sodium chloride 0.9 % 100 mL IVPB  Status:  Discontinued     1 g 200 mL/hr over 30 Minutes Intravenous Daily 01/15/19 0714 01/18/19 0915   01/14/19 1400  azithromycin (ZITHROMAX) 500 mg in sodium chloride 0.9 % 250 mL IVPB  Status:  Discontinued     500 mg 250 mL/hr over 60 Minutes Intravenous Every 24 hours 01/13/19 2330 01/18/19 0915   01/14/19 1000  cefTRIAXone (ROCEPHIN) 1 g in sodium chloride 0.9 % 100 mL IVPB  Status:  Discontinued     1 g 200 mL/hr over 30 Minutes Intravenous Every 24 hours 01/13/19 2330 01/14/19 0918   01/13/19 1515  cefTRIAXone (ROCEPHIN) 1 g in sodium chloride 0.9 % 100 mL IVPB     1 g 200 mL/hr over 30 Minutes Intravenous  Once 01/13/19 1502 01/13/19 1613   01/13/19 1515  azithromycin (ZITHROMAX) 500 mg in sodium chloride 0.9 % 250 mL IVPB     500 mg 250 mL/hr over 60  Minutes Intravenous  Once 01/13/19 1502 01/13/19 1655      Subjective:  Patient  seen and examined at bedside this morning.  Hemodynamically stable.  No fever today.  Denies any abdomen pain, nausea or vomiting.  Had a loose stool this morning, he did not see the color of the stool.  He said he could not sleep last night.  Objective: Vitals:   01/22/19 0640 01/22/19 1307 01/22/19 2120 01/23/19 0513  BP: (!) 99/57 (!) 97/57 (!) 110/59 (!) 95/55  Pulse: 72 67 (!) 59 (!) 55  Resp: 18 18    Temp: 99 F (37.2 C) 98.7 F (37.1 C) 98.7 F (37.1 C) 98.7 F (37.1 C)  TempSrc:  Oral Oral Oral  SpO2: 96% 98% 98% 97%  Weight:      Height:        Intake/Output Summary (Last 24 hours) at 01/23/2019 1114 Last data filed at 01/23/2019 0516 Gross per 24 hour  Intake --  Output 400 ml  Net -400 ml   Filed Weights   01/13/19 1400 01/20/19 0427 01/21/19 0902  Weight: 65 kg 59.7 kg 59.7 kg    Examination:   General exam: Not in distress,average built,looks weak and deconditioned HEENT:PERRL,Oral mucosa moist, Ear/Nose normal on gross exam Respiratory system: Bilateral equal air entry, normal vesicular breath sounds, no wheezes or crackles  Cardiovascular system: S1 & S2 heard, RRR. No JVD, murmurs, rubs, gallops or clicks. Gastrointestinal system: Abdomen is nondistended, soft and nontender. No organomegaly or masses felt. Normal bowel sounds heard. Central nervous system: Alert and oriented. No focal neurological deficits.Stammering Extremities: No edema, no clubbing ,no cyanosis, distal peripheral pulses palpable. Skin: No rashes, lesions or ulcers,no icterus ,no pallor    Data Reviewed: I have personally reviewed following labs and imaging studies  CBC: Recent Labs  Lab 01/17/19 0249 01/18/19 0213 01/20/19 0229 01/23/19 0238  WBC 7.0 4.3 7.4 6.5  NEUTROABS 6.1 3.1  --  5.1  HGB 9.3* 9.6* 9.8* 10.7*  HCT 27.7* 29.1* 31.3* 34.2*  MCV 79.4* 80.8 81.3 81.0  PLT 279 292 366 341*   Basic Metabolic Panel: Recent Labs  Lab 01/17/19 0249 01/18/19 0213  01/20/19 0229 01/23/19 0238  NA 136 138 133* 135  K 3.3* 4.0 3.7 3.7  CL 105 107 102 106  CO2 23 24 22 22   GLUCOSE 105* 105* 92 96  BUN <5* <5* <5* 6  CREATININE 0.78 0.74 0.71 0.68  CALCIUM 7.5* 7.6* 7.7* 8.0*  MG 1.6* 2.0  --   --   PHOS 2.4* 2.7  --   --    GFR: Estimated Creatinine Clearance: 107.8 mL/min (by C-G formula based on SCr of 0.68 mg/dL). Liver Function Tests: Recent Labs  Lab 01/17/19 0249  AST 73*  ALT 31  ALKPHOS 71  BILITOT 0.5  PROT 4.6*  ALBUMIN 1.7*   No results for input(s): LIPASE, AMYLASE in the last 168 hours. No results for input(s): AMMONIA in the last 168 hours. Coagulation Profile: No results for input(s): INR, PROTIME in the last 168 hours. Cardiac Enzymes: No results for input(s): CKTOTAL, CKMB, CKMBINDEX, TROPONINI in the last 168 hours. BNP (last 3 results) No results for input(s): PROBNP in the last 8760 hours. HbA1C: No results for input(s): HGBA1C in the last 72 hours. CBG: Recent Labs  Lab 01/22/19 0802 01/22/19 1149 01/22/19 1709 01/22/19 2122 01/23/19 0759  GLUCAP 141* 137* 233* 213* 88   Lipid Profile: No results for input(s): CHOL, HDL, LDLCALC, TRIG,  CHOLHDL, LDLDIRECT in the last 72 hours. Thyroid Function Tests: No results for input(s): TSH, T4TOTAL, FREET4, T3FREE, THYROIDAB in the last 72 hours. Anemia Panel: No results for input(s): VITAMINB12, FOLATE, FERRITIN, TIBC, IRON, RETICCTPCT in the last 72 hours. Sepsis Labs: No results for input(s): PROCALCITON, LATICACIDVEN in the last 168 hours.  Recent Results (from the past 240 hour(s))  Culture, blood (routine x 2)     Status: None   Collection Time: 01/13/19  3:27 PM   Specimen: BLOOD RIGHT HAND  Result Value Ref Range Status   Specimen Description BLOOD RIGHT HAND  Final   Special Requests   Final    BOTTLES DRAWN AEROBIC AND ANAEROBIC Blood Culture results may not be optimal due to an inadequate volume of blood received in culture bottles   Culture    Final    NO GROWTH 5 DAYS Performed at [facility], [place] [address] [place], [place] [address]    Report Status 01/18/2019 FINAL  Final  SARS Coronavirus 2 [facility] order, Performed in [facility] hospital lab) Nasopharyngeal Nasopharyngeal Swab     Status: None   Collection Time: 01/13/19  3:37 PM   Specimen: Nasopharyngeal Swab  Result Value Ref Range Status   SARS Coronavirus 2 NEGATIVE NEGATIVE Final    Comment: (NOTE) If result is NEGATIVE SARS-CoV-2 target nucleic acids are NOT DETECTED. The SARS-CoV-2 RNA is generally detectable in upper and lower  respiratory specimens during the acute phase of infection. The lowest  concentration of SARS-CoV-2 viral copies this assay can detect is 250  copies / mL. A negative result does not preclude SARS-CoV-2 infection  and should not be used as the sole basis for treatment or other  patient management decisions.  A negative result may occur with  improper specimen collection / handling, submission of specimen other  than nasopharyngeal swab, presence of viral mutation(s) within the  areas targeted by this assay, and inadequate number of viral copies  (<250 copies / mL). A negative result must be combined with clinical  observations, patient history, and epidemiological information. If result is POSITIVE SARS-CoV-2 target nucleic acids are DETECTED. The SARS-CoV-2 RNA is generally detectable in upper and lower  respiratory specimens dur ing the acute phase of infection.  Positive  results are indicative of active infection with SARS-CoV-2.  Clinical  correlation with patient history and other diagnostic information is  necessary to determine patient infection status.  Positive results do  not rule out bacterial infection or co-infection with other viruses. If result is PRESUMPTIVE POSTIVE SARS-CoV-2 nucleic acids MAY BE PRESENT.   A presumptive positive result was obtained on the submitted specimen  and confirmed on repeat  testing.  While 2019 novel coronavirus  (SARS-CoV-2) nucleic acids may be present in the submitted sample  additional confirmatory testing may be necessary for epidemiological  and / or clinical management purposes  to differentiate between  SARS-CoV-2 and other Sarbecovirus currently known to infect humans.  If clinically indicated additional testing with an alternate test  methodology [phone]) is advised. The SARS-CoV-2 RNA is generally  detectable in upper and lower respiratory sp ecimens during the acute  phase of infection. The expected result is Negative. Fact Sheet for Patients:  StrictlyIdeas.no Fact Sheet for Healthcare Providers: BankingDealers.co.za This test is not yet approved or cleared by the [place] FDA and has been authorized for detection and/or diagnosis of SARS-CoV-2 by FDA under an Emergency Use Authorization (EUA).  This EUA will remain in effect (  meaning this test can be used) for the duration of the COVID-19 declaration under Section 564(b)(1) of the Act, 21 U.S.C. section 360bbb-3(b)(1), unless the authorization is terminated or revoked sooner. Performed at [facility], [place] [address], [place], [place] [address]   Urine culture     Status: Abnormal   Collection Time: 01/13/19  4:56 PM   Specimen: In/Out Cath Urine  Result Value Ref Range Status   Specimen Description IN/OUT CATH URINE  Final   Special Requests   Final    NONE Performed at [facility], [place] [address] [place], [place] [address]    Culture (A)  Final    60,000 COLONIES/mL MULTIPLE SPECIES PRESENT, SUGGEST RECOLLECTION   Report Status 01/15/2019 FINAL  Final  C difficile quick scan w PCR reflex     Status: None   Collection Time: 01/13/19  5:35 PM   Specimen: STOOL  Result Value Ref Range Status   C Diff antigen NEGATIVE NEGATIVE Final   C Diff toxin NEGATIVE NEGATIVE Final   C Diff interpretation No C. difficile  detected.  Final    Comment: Performed at [facility], [place] [address], [place], [place] [address]  Gastrointestinal Panel by PCR , Stool     Status: None   Collection Time: 01/13/19  5:36 PM   Specimen: STOOL  Result Value Ref Range Status   Campylobacter species NOT DETECTED NOT DETECTED Final   Plesimonas shigelloides NOT DETECTED NOT DETECTED Final   Salmonella species NOT DETECTED NOT DETECTED Final   Yersinia enterocolitica NOT DETECTED NOT DETECTED Final   Vibrio species NOT DETECTED NOT DETECTED Final   Vibrio cholerae NOT DETECTED NOT DETECTED Final   Enteroaggregative E coli (EAEC) NOT DETECTED NOT DETECTED Final   Enteropathogenic E coli (EPEC) NOT DETECTED NOT DETECTED Final   Enterotoxigenic E coli (ETEC) NOT DETECTED NOT DETECTED Final   Shiga like toxin producing E coli (STEC) NOT DETECTED NOT DETECTED Final   Shigella/Enteroinvasive E coli (EIEC) NOT DETECTED NOT DETECTED Final   Cryptosporidium NOT DETECTED NOT DETECTED Final   Cyclospora cayetanensis NOT DETECTED NOT DETECTED Final   Entamoeba histolytica NOT DETECTED NOT DETECTED Final   Giardia lamblia NOT DETECTED NOT DETECTED Final   Adenovirus F40/41 NOT DETECTED NOT DETECTED Final   Astrovirus NOT DETECTED NOT DETECTED Final   Norovirus GI/GII NOT DETECTED NOT DETECTED Final   Rotavirus A NOT DETECTED NOT DETECTED Final   Sapovirus (I, II, IV, and V) NOT DETECTED NOT DETECTED Final    Comment: Performed at [facility], [place] [place], [place] [address]  Culture, blood (routine x 2)     Status: None   Collection Time: 01/13/19  7:30 PM   Specimen: BLOOD RIGHT ARM  Result Value Ref Range Status   Specimen Description BLOOD RIGHT ARM  Final   Special Requests   Final    BOTTLES DRAWN AEROBIC AND ANAEROBIC Blood Culture results may not be optimal due to an inadequate volume of blood received in culture bottles   Culture   Final    NO GROWTH 5 DAYS Performed at [facility],  [address] [place], [place] [address]    Report Status 01/18/2019 FINAL  Final  MRSA PCR Screening     Status: None   Collection Time: 01/14/19  2:01 AM   Specimen: Nasal Mucosa; Nasopharyngeal  Result Value Ref Range Status   MRSA by PCR NEGATIVE NEGATIVE Final  Comment:        The GeneXpert MRSA Assay (FDA approved for NASAL specimens only), is one component of a comprehensive MRSA colonization surveillance program. It is not intended to diagnose MRSA infection nor to guide or monitor treatment for MRSA infections. Performed at [facility], [place] [address] [place], [place] [address]   Respiratory Panel by PCR     Status: None   Collection Time: 01/14/19  9:14 AM   Specimen: Nasopharyngeal Swab; Respiratory  Result Value Ref Range Status   Adenovirus NOT DETECTED NOT DETECTED Final   Coronavirus 229E NOT DETECTED NOT DETECTED Final    Comment: (NOTE) The Coronavirus on the Respiratory Panel, DOES NOT test for the novel  Coronavirus (2019 nCoV)    Coronavirus HKU1 NOT DETECTED NOT DETECTED Final   Coronavirus NL63 NOT DETECTED NOT DETECTED Final   Coronavirus OC43 NOT DETECTED NOT DETECTED Final   Metapneumovirus NOT DETECTED NOT DETECTED Final   Rhinovirus / Enterovirus NOT DETECTED NOT DETECTED Final   Influenza A NOT DETECTED NOT DETECTED Final   Influenza B NOT DETECTED NOT DETECTED Final   Parainfluenza Virus 1 NOT DETECTED NOT DETECTED Final   Parainfluenza Virus 2 NOT DETECTED NOT DETECTED Final   Parainfluenza Virus 3 NOT DETECTED NOT DETECTED Final   Parainfluenza Virus 4 NOT DETECTED NOT DETECTED Final   Respiratory Syncytial Virus NOT DETECTED NOT DETECTED Final   Bordetella pertussis NOT DETECTED NOT DETECTED Final   Chlamydophila pneumoniae NOT DETECTED NOT DETECTED Final   Mycoplasma pneumoniae NOT DETECTED NOT DETECTED Final    Comment: Performed at [facility], [place]. [address] [place], [place] [address]  Culture, blood (Routine X 2) w  Reflex to ID Panel     Status: None   Collection Time: 01/14/19  9:23 PM   Specimen: BLOOD  Result Value Ref Range Status   Specimen Description BLOOD RIGHT ARM  Final   Special Requests   Final    BOTTLES DRAWN AEROBIC AND ANAEROBIC Blood Culture adequate volume   Culture   Final    NO GROWTH 5 DAYS Performed at [facility], [address], [place], [place] [address]    Report Status 01/19/2019 FINAL  Final  Culture, blood (Routine X 2) w Reflex to ID Panel     Status: None   Collection Time: 01/14/19  9:23 PM   Specimen: BLOOD  Result Value Ref Range Status   Specimen Description BLOOD LEFT HAND  Final   Special Requests   Final    BOTTLES DRAWN AEROBIC AND ANAEROBIC Blood Culture adequate volume   Culture   Final    NO GROWTH 5 DAYS Performed at [facility], [place] [address] [place], [place] [address]    Report Status 01/19/2019 FINAL  Final  SARS CORONAVIRUS 2 (TAT 6-24 HRS) Nasopharyngeal Nasopharyngeal Swab     Status: None   Collection Time: 01/16/19  5:20 PM   Specimen: Nasopharyngeal Swab  Result Value Ref Range Status   SARS Coronavirus 2 NEGATIVE NEGATIVE Final    Comment: (NOTE) SARS-CoV-2 target nucleic acids are NOT DETECTED. The SARS-CoV-2 RNA is generally detectable in upper and lower respiratory specimens during the acute phase of infection. Negative results do not preclude SARS-CoV-2 infection, do not rule out co-infections with other pathogens, and should not be used as the sole basis for treatment or other patient management decisions. Negative results must be combined with clinical observations, patient history, and epidemiological information.  The expected result is Negative. Fact Sheet for Patients: SugarRoll.be Fact Sheet for Healthcare Providers: https://www.woods-mathews.com/ This test is not yet approved or cleared by the [place] FDA and  has been authorized for detection and/or diagnosis  of SARS-CoV-2 by FDA under an Emergency Use Authorization (EUA). This EUA will remain  in effect (meaning this test can be used) for the duration of the COVID-19 declaration under Section 56 4(b)(1) of the Act, 21 U.S.C. section 360bbb-3(b)(1), unless the authorization is terminated or revoked sooner. Performed at [facility], [place] [address], [place], [place] [address]          Radiology Studies: Ct Diannia Ruder Contast  Result Date: 01/21/2019 CLINICAL DATA:  Inflammatory bowel disease EXAM: CT ABDOMEN AND PELVIS WITH CONTRAST (ENTEROGRAPHY) TECHNIQUE: Multidetector CT of the abdomen and pelvis during bolus administration of intravenous contrast. Negative oral contrast was given. CONTRAST:  12m OMNIPAQUE IOHEXOL 300 MG/ML  SOLN COMPARISON:  CT chest abdomen pelvis, 01/16/2019 FINDINGS: Lower chest: There is extensive, right greater than left bibasilar heterogeneous and consolidative airspace disease, which is worsened in comparison to recent CT dated 01/16/2019. Hepatobiliary: No solid liver abnormality is seen. No gallstones, gallbladder wall thickening, or biliary dilatation. Pancreas: Unremarkable. No pancreatic ductal dilatation or surrounding inflammatory changes. Spleen: Normal in size without significant abnormality. Adrenals/Urinary Tract: Adrenal glands are unremarkable. Kidneys are normal, without renal calculi, solid lesion, or hydronephrosis. Bladder is unremarkable. Stomach/Bowel: Stomach is within normal limits. Appendix appears normal. The small bowel and colon are fluid-filled by negative contrast to the rectum without overt distention. There is redemonstrated thickening and luminal narrowing of two segments of terminal ileum involving the distal ~30 cm of the ileum separated by a short segment of relatively normal appearing bowel, this overall appearance with somewhat fixed, tethered configuration in comparison to prior examination (series 3, image 51, series 6,  image 66, 75). Vascular/Lymphatic: No significant vascular findings are present. No enlarged abdominal or pelvic lymph nodes. Reproductive: No mass or other significant abnormality. Other: No abdominal wall hernia or abnormality. Small volume free fluid in the right lower quadrant and low pelvis. Musculoskeletal: There is sclerosis of the bilateral sacroiliac joints (series 4, image 157). IMPRESSION: 1. There is redemonstrated thickening and luminal narrowing of two segments of terminal ileum involving the distal ~30 cm of the ileum separated by a short segment of relatively normal appearing bowel, this overall appearance with somewhat fixed, tethered configuration in comparison to prior examination (series 3, image 51, series 6, image 66, 75). Findings are consistent with infectious or inflammatory enteritis, particularly Crohn's disease given this appearance and distribution. The involved segments are suspicious for stricture although there is no evidence of overt bowel obstruction. No evidence of fistula or complication by abscess formation. 2. The remainder of the bowel is normal in appearance, fluid filled by negative enteric contrast. 3. Small volume free fluid in the right lower quadrant and low pelvis, likely reactive. 4. There is extensive, right greater than left bibasilar heterogeneous and consolidative airspace disease, which is worsened in comparison to recent CT dated 01/16/2019 and consistent with multifocal infection. 5. There is sclerosis of the bilateral sacroiliac joints (series 4, image 157), advanced for patient age and consistent with inflammatory sacroiliitis, as can be seen in association with inflammatory bowel disease. Electronically Signed   By: AEddie CandleM.D.   On: 01/21/2019 14:19        Scheduled Meds:  ARIPiprazole  10 mg Oral QHS   benztropine  1 mg Oral Daily   Chlorhexidine Gluconate Cloth  6 each Topical Daily   citalopram  20 mg Oral QHS   heparin  5,000 Units  Subcutaneous Q8H   influenza vac split quadrivalent PF  0.5 mL Intramuscular Tomorrow-1000   insulin aspart  0-5 Units Subcutaneous QHS   insulin aspart  0-9 Units Subcutaneous TID WC   methylPREDNISolone (SOLU-MEDROL) injection  60 mg Intravenous Q24H   sodium chloride flush  3 mL Intravenous Q12H   traZODone  200 mg Oral QHS   Continuous Infusions:  sodium chloride 125 mL/hr at 01/16/19 0118     LOS: 10 days    Time spent: 35 mins.More than 50% of that time was spent in counseling and/or coordination of care.      Shelly Coss, MD [facility] Pager [phone]  If 7PM-7AM, please contact night-coverage [email or web address] Password [email or web address] 01/23/2019, 11:14 AM

## 2019-01-23 NOTE — Progress Notes (Signed)
Subjective: Patient has remained afebrile in the last 24 hours. He is able to tolerate regular diet, continues to have loose stools however the frequency has reduced, denies abdominal pain.  Objective: Vital signs in last 24 hours: Temp:  [98.7 F (37.1 C)] 98.7 F (37.1 C) (09/22 0513) Pulse Rate:  [55-67] 55 (09/22 0513) Resp:  [18] 18 (09/21 1307) BP: (95-110)/(55-59) 95/55 (09/22 0513) SpO2:  [97 %-98 %] 97 % (09/22 0513) Weight change:  Last BM Date: 01/11/19  PE: Lying on bed, appears comfortable GENERAL: On room air, mild pallor ABDOMEN: Soft, nondistended, nontender, normoactive bowel sounds EXTREMITIES: No deformity  Lab Results: Results for orders placed or performed during the hospital encounter of 01/13/19 (from the past 48 hour(s))  Glucose, capillary     Status: None   Collection Time: 01/21/19 12:08 PM  Result Value Ref Range   Glucose-Capillary 76 70 - 99 mg/dL  Glucose, capillary     Status: None   Collection Time: 01/21/19  5:09 PM  Result Value Ref Range   Glucose-Capillary 99 70 - 99 mg/dL  Glucose, capillary     Status: Abnormal   Collection Time: 01/21/19  9:57 PM  Result Value Ref Range   Glucose-Capillary 102 (H) 70 - 99 mg/dL  Glucose, capillary     Status: Abnormal   Collection Time: 01/21/19 10:53 PM  Result Value Ref Range   Glucose-Capillary 102 (H) 70 - 99 mg/dL  Glucose, capillary     Status: Abnormal   Collection Time: 01/22/19  8:02 AM  Result Value Ref Range   Glucose-Capillary 141 (H) 70 - 99 mg/dL  Glucose, capillary     Status: Abnormal   Collection Time: 01/22/19 11:49 AM  Result Value Ref Range   Glucose-Capillary 137 (H) 70 - 99 mg/dL  Glucose, capillary     Status: Abnormal   Collection Time: 01/22/19  5:09 PM  Result Value Ref Range   Glucose-Capillary 233 (H) 70 - 99 mg/dL  Glucose, capillary     Status: Abnormal   Collection Time: 01/22/19  9:22 PM  Result Value Ref Range   Glucose-Capillary 213 (H) 70 - 99 mg/dL    Comment 1 Notify RN    Comment 2 Document in Chart   CBC with Differential/Platelet     Status: Abnormal   Collection Time: 01/23/19  2:38 AM  Result Value Ref Range   WBC 6.5 4.0 - 10.5 K/uL   RBC 4.22 4.22 - 5.81 MIL/uL   Hemoglobin 10.7 (L) 13.0 - 17.0 g/dL   HCT 34.2 (L) 39.0 - 52.0 %   MCV 81.0 80.0 - 100.0 fL   MCH 25.4 (L) 26.0 - 34.0 pg   MCHC 31.3 30.0 - 36.0 g/dL   RDW 14.5 11.5 - 15.5 %   Platelets 570 (H) 150 - 400 K/uL   nRBC 0.0 0.0 - 0.2 %   Neutrophils Relative % 78 %   Neutro Abs 5.1 1.7 - 7.7 K/uL   Lymphocytes Relative 8 %   Lymphs Abs 0.5 (L) 0.7 - 4.0 K/uL   Monocytes Relative 11 %   Monocytes Absolute 0.7 0.1 - 1.0 K/uL   Eosinophils Relative 1 %   Eosinophils Absolute 0.1 0.0 - 0.5 K/uL   Basophils Relative 1 %   Basophils Absolute 0.0 0.0 - 0.1 K/uL   Immature Granulocytes 1 %   Abs Immature Granulocytes 0.03 0.00 - 0.07 K/uL    Comment: Performed at [facility], [address],  [place], [place] [address]  Basic metabolic panel     Status: Abnormal   Collection Time: 01/23/19  2:38 AM  Result Value Ref Range   Sodium 135 135 - 145 mmol/L   Potassium 3.7 3.5 - 5.1 mmol/L   Chloride 106 98 - 111 mmol/L   CO2 22 22 - 32 mmol/L   Glucose, Bld 96 70 - 99 mg/dL   BUN 6 6 - 20 mg/dL   Creatinine, Ser 0.68 0.61 - 1.24 mg/dL   Calcium 8.0 (L) 8.9 - 10.3 mg/dL   GFR calc non Af Amer >60 >60 mL/min   GFR calc Af Amer >60 >60 mL/min   Anion gap 7 5 - 15    Comment: Performed at [facility] [address], [place], [place] [address]  Glucose, capillary     Status: None   Collection Time: 01/23/19  7:59 AM  Result Value Ref Range   Glucose-Capillary 88 70 - 99 mg/dL    Studies/Results: Ct Entero Abd/pelvis W Contast  Result Date: 01/21/2019 CLINICAL DATA:  Inflammatory bowel disease EXAM: CT ABDOMEN AND PELVIS WITH CONTRAST (ENTEROGRAPHY) TECHNIQUE: Multidetector CT of the abdomen and pelvis during bolus administration of intravenous  contrast. Negative oral contrast was given. CONTRAST:  176m OMNIPAQUE IOHEXOL 300 MG/ML  SOLN COMPARISON:  CT chest abdomen pelvis, 01/16/2019 FINDINGS: Lower chest: There is extensive, right greater than left bibasilar heterogeneous and consolidative airspace disease, which is worsened in comparison to recent CT dated 01/16/2019. Hepatobiliary: No solid liver abnormality is seen. No gallstones, gallbladder wall thickening, or biliary dilatation. Pancreas: Unremarkable. No pancreatic ductal dilatation or surrounding inflammatory changes. Spleen: Normal in size without significant abnormality. Adrenals/Urinary Tract: Adrenal glands are unremarkable. Kidneys are normal, without renal calculi, solid lesion, or hydronephrosis. Bladder is unremarkable. Stomach/Bowel: Stomach is within normal limits. Appendix appears normal. The small bowel and colon are fluid-filled by negative contrast to the rectum without overt distention. There is redemonstrated thickening and luminal narrowing of two segments of terminal ileum involving the distal ~30 cm of the ileum separated by a short segment of relatively normal appearing bowel, this overall appearance with somewhat fixed, tethered configuration in comparison to prior examination (series 3, image 51, series 6, image 66, 75). Vascular/Lymphatic: No significant vascular findings are present. No enlarged abdominal or pelvic lymph nodes. Reproductive: No mass or other significant abnormality. Other: No abdominal wall hernia or abnormality. Small volume free fluid in the right lower quadrant and low pelvis. Musculoskeletal: There is sclerosis of the bilateral sacroiliac joints (series 4, image 157). IMPRESSION: 1. There is redemonstrated thickening and luminal narrowing of two segments of terminal ileum involving the distal ~30 cm of the ileum separated by a short segment of relatively normal appearing bowel, this overall appearance with somewhat fixed, tethered configuration in  comparison to prior examination (series 3, image 51, series 6, image 66, 75). Findings are consistent with infectious or inflammatory enteritis, particularly Crohn's disease given this appearance and distribution. The involved segments are suspicious for stricture although there is no evidence of overt bowel obstruction. No evidence of fistula or complication by abscess formation. 2. The remainder of the bowel is normal in appearance, fluid filled by negative enteric contrast. 3. Small volume free fluid in the right lower quadrant and low pelvis, likely reactive. 4. There is extensive, right greater than left bibasilar heterogeneous and consolidative airspace disease, which is worsened in comparison to recent CT dated 01/16/2019 and consistent with multifocal infection. 5. There is sclerosis of  the bilateral sacroiliac joints (series 4, image 157), advanced for patient age and consistent with inflammatory sacroiliitis, as can be seen in association with inflammatory bowel disease. Electronically Signed   By: Eddie Candle M.D.   On: 01/21/2019 14:19    Medications: I have reviewed the patient's current medications.  Assessment: Thickening and luminal narrowing of terminal ileum involving almost 30 segment of distal ileum, consistent with inflammatory enteritis suspicious for Crohn's disease  Extensive, right greater than left bibasilar consolidative airspace disease  Sclerosis of bilateral sacroiliac joint, possibly related underlying IBD  Microcytic anemia, low total protein and albumin consistent with malnutrition  Plan: Biopsies from rectal ulcer and distal esophagus pending. Clinical picture, elevated inflammatory markers, CT enterography suggestive of Crohn's disease. Has been started on IV methylprednisolone since yesterday, improvement in fever. IBD markers- ANCA and ASCA are pending.   Ronnette Juniper 01/23/2019, 9:40 AM   Pager [phone] If no answer or after 5 PM call [phone]

## 2019-01-24 ENCOUNTER — Other Ambulatory Visit: Payer: Self-pay

## 2019-01-24 LAB — GLUCOSE, CAPILLARY
Glucose-Capillary: 93 mg/dL (ref 70–99)
Glucose-Capillary: 89 mg/dL (ref 70–99)
Glucose-Capillary: 136 mg/dL — ABNORMAL HIGH (ref 70–99)
Glucose-Capillary: 110 mg/dL — ABNORMAL HIGH (ref 70–99)

## 2019-01-24 MED ORDER — ENSURE ENLIVE PO LIQD: 237.0000 mL | Freq: Two times a day (BID) | ORAL | Status: DC

## 2019-01-24 MED ORDER — SODIUM CHLORIDE 0.9 % IV SOLN
INTRAVENOUS | Status: DC
  Administered 2019-01-24 (×2): via INTRAVENOUS

## 2019-01-24 NOTE — Progress Notes (Signed)
Subjective: Patient reports that his stools today morning look like pus.  2 bowel movements reported as per documentation. Patient has not had a fever in over 48 hours. He has been able to tolerate regular diet.  Objective: Vital signs in last 24 hours: Temp:  [98.1 F (36.7 C)-98.3 F (36.8 C)] 98.3 F (36.8 C) (09/23 0630) Pulse Rate:  [55-66] 55 (09/23 0630) Resp:  [18] 18 (09/23 0630) BP: (96-110)/(58-65) 96/59 (09/23 0630) SpO2:  [98 %] 98 % (09/23 0630) Weight change:  Last BM Date: 01/23/19  PE: Thinly built, chronically ill-appearing GENERAL: Mild pallor, no icterus ABDOMEN: Soft, no tenderness, normoactive bowel sounds EXTREMITIES: No deformity  Lab Results: Results for orders placed or performed during the hospital encounter of 01/13/19 (from the past 48 hour(s))  ANCA Titers     Status: None   Collection Time: 01/22/19 10:38 AM  Result Value Ref Range   C-ANCA <1:20 Neg:<1:20 titer   P-ANCA <1:20 Neg:<1:20 titer    Comment: (NOTE) The presence of positive fluorescence exhibiting P-ANCA or C-ANCA patterns alone is not specific for the diagnosis of Wegener's Granulomatosis (WG) or microscopic polyangiitis. Decisions about treatment should not be based solely on ANCA IFA results.  The International ANCA Group Consensus recommends follow up testing of positive sera with both PR-3 and MPO-ANCA enzyme immunoassays. As many as 5% serum samples are positive only by EIA. Ref. AM J Clin Pathol 1999;111:507-513.    Atypical P-ANCA titer <1:20 Neg:<1:20 titer    Comment: (NOTE) The atypical pANCA pattern has been observed in a significant percentage of patients with ulcerative colitis, primary sclerosing cholangitis and autoimmune hepatitis. Performed At: [facility] [place], [place] [id number] Rush Farmer MD [id number]   Mpo/pr-3 (anca) antibodies     Status: Abnormal   Collection Time: 01/22/19 10:38 AM  Result Value Ref Range   Myeloperoxidase Abs <9.0 0.0 - 9.0 U/mL   ANCA Proteinase 3 8.7 (H) 0.0 - 3.5 U/mL    Comment: (NOTE) Performed At: [facility] [place], [place] [id number] Rush Farmer MD ME:[id number]   Glucose, capillary     Status: Abnormal   Collection Time: 01/22/19 11:49 AM  Result Value Ref Range   Glucose-Capillary 137 (H) 70 - 99 mg/dL  Glucose, capillary     Status: Abnormal   Collection Time: 01/22/19  5:09 PM  Result Value Ref Range   Glucose-Capillary 233 (H) 70 - 99 mg/dL  Glucose, capillary     Status: Abnormal   Collection Time: 01/22/19  9:22 PM  Result Value Ref Range   Glucose-Capillary 213 (H) 70 - 99 mg/dL   Comment 1 Notify RN    Comment 2 Document in Chart   CBC with Differential/Platelet     Status: Abnormal   Collection Time: 01/23/19  2:38 AM  Result Value Ref Range   WBC 6.5 4.0 - 10.5 K/uL   RBC 4.22 4.22 - 5.81 MIL/uL   Hemoglobin 10.7 (L) 13.0 - 17.0 g/dL   HCT 34.2 (L) 39.0 - 52.0 %   MCV 81.0 80.0 - 100.0 fL   MCH 25.4 (L) 26.0 - 34.0 pg   MCHC 31.3 30.0 - 36.0 g/dL   RDW 14.5 11.5 - 15.5 %   Platelets 570 (H) 150 - 400 K/uL   nRBC 0.0 0.0 - 0.2 %   Neutrophils Relative % 78 %   Neutro Abs 5.1 1.7 - 7.7 K/uL   Lymphocytes Relative 8 %   Lymphs Abs  0.5 (L) 0.7 - 4.0 K/uL   Monocytes Relative 11 %   Monocytes Absolute 0.7 0.1 - 1.0 K/uL   Eosinophils Relative 1 %   Eosinophils Absolute 0.1 0.0 - 0.5 K/uL   Basophils Relative 1 %   Basophils Absolute 0.0 0.0 - 0.1 K/uL   Immature Granulocytes 1 %   Abs Immature Granulocytes 0.03 0.00 - 0.07 K/uL    Comment: Performed at [facility] [address] [place], [place] [address]  Basic metabolic panel     Status: Abnormal   Collection Time: 01/23/19  2:38 AM  Result Value Ref Range   Sodium 135 135 - 145 mmol/L   Potassium 3.7 3.5 - 5.1 mmol/L   Chloride 106 98 - 111 mmol/L   CO2 22 22 - 32 mmol/L   Glucose, Bld 96 70 - 99 mg/dL   BUN 6 6 - 20 mg/dL   Creatinine, Ser  0.68 0.61 - 1.24 mg/dL   Calcium 8.0 (L) 8.9 - 10.3 mg/dL   GFR calc non Af Amer >60 >60 mL/min   GFR calc Af Amer >60 >60 mL/min   Anion gap 7 5 - 15    Comment: Performed at [facility] [address] [place], [place] [address]  Glucose, capillary     Status: None   Collection Time: 01/23/19  7:59 AM  Result Value Ref Range   Glucose-Capillary 88 70 - 99 mg/dL  Glucose, capillary     Status: Abnormal   Collection Time: 01/23/19 12:08 PM  Result Value Ref Range   Glucose-Capillary 128 (H) 70 - 99 mg/dL  Glucose, capillary     Status: Abnormal   Collection Time: 01/23/19  5:12 PM  Result Value Ref Range   Glucose-Capillary 111 (H) 70 - 99 mg/dL  Glucose, capillary     Status: Abnormal   Collection Time: 01/23/19  9:21 PM  Result Value Ref Range   Glucose-Capillary 118 (H) 70 - 99 mg/dL  Glucose, capillary     Status: None   Collection Time: 01/24/19  7:55 AM  Result Value Ref Range   Glucose-Capillary 93 70 - 99 mg/dL    Studies/Results: No results found.  Medications: I have reviewed the patient's current medications.  Assessment: Rectal biopsy showed moderately to severely active chronic colitis compatible with inflammatory bowel disease. CT enterography showed involvement of terminal ileum, this is suggestive of Crohn's colitis. ANCA positive  Biopsies from esophagus showed minimally inflamed squamous mucosa, biopsies from stomach showed chronic focally active gastritis without evidence of H. Pylori  Microcytic anemia Malnutrition  Plan: CT enterography, rectal biopsies, positive ANCA compatible with Crohn's colitis. Patient doing well on IV steroids. Plan to transition to oral steroids tomorrow, and hopefully discharge on oral steroids tapering dose. Discussed with patient that he will need long-term and outpatient management for Crohn's colitis, likely biologic therapy or immunosuppressants. We will send TB Gold testing and hepatitis B surface antigen to  rule out contraindication for use of Biologics or immunosuppressants as an outpatient. Likely discharge in a.m.   Ronnette Juniper 01/24/2019, 9:51 AM   Pager [phone] If no answer or after 5 PM call [phone]

## 2019-01-24 NOTE — Progress Notes (Signed)
PROGRESS NOTE    Carl Carey  ZOX:[id number] DOB: June 22, 1980 DOA: 01/13/2019 PCP: Patient, No Pcp Per   Brief Narrative: 37 year old male with history of bipolar disorder presented on 01/13/2019 with shortness of breath and respiratory distress along with altered mental status. He was apparently found by family, lethargic but vomiting. Patient had recent admission to [facility] for gastroenteritis and hyponatremia and reportedly had enteritis on CT scan at that time with negative stool culture/stool for C. difficile and GI PCR. On presentation, he was found to have elevated lactic with leukocytosis. He was started on IV fluids and broad-spectrum antibiotics for possible pneumonia. Now off antibiotics .  Patient underwent EGD and colonoscopy on 01/21/19.  EGD revealed esophagitis, gastric and pyloric ulcers.  Colonoscopy revealed terminal ileal stenosis and scattered rectal ulcers.   CT enterography was suggestive  of Crohn's disease.  Started on IV steroids. Plan for changing the steroids to oral tomorrow and possibly discharge.   Assessment & Plan:   Principal Problem:   Bipolar disorder, in partial remission, most recent episode manic ([facility]) Active Problems:   Septic shock (HCC)   Community-acquired pneumonia Acute hypoxic respiratory failure: RequiredBiPAP initially -Completed course of Rocephin and Zithromax. -Blood cultures negative.Urine streptococcal antigen negative. Respiratory panel PCR negative. COVID-19 test negative. -Urine Legionella antigennegative. --2D echo showed EF of 50 to 55% with no valvular vegetation visualized. - Repeat CT of the chest abdomen and pelvison 01/16/2019 showed multiple lung opacities bilaterally, predominantly right lung consistent with multifocal pneumonia. -Respiratory status stable now. On room air  Abdominal pain/diarrhea/Chron's disease -Negative work-up  including stool for C. difficile/GI PCR and cultures. Had recent CT  which showed multiple lung opacities as mentioned above, diffuse wall thickening of the esophagus, severe wall thickening of the terminal ileum.  Concern for inflammatory bowel disease. -Consulted gastroenterology.Underwent EGD and colonoscopy with findings as above -CT enterography concerning for Crohn's disease.  Started on steroids. -Fever was most likely associated with inflammatory disease rather than infectious etiology.fever has resolved -Plan to change the steroids to oral tomorrow.  He needs long-term and outpatient management for Crohn's colitis likely with biological therapy and immunosuppressants.  TB/hepatitis B surface antigen sent today by GI.  History of bipolar disorder -Patient is on multiple medications.Medications have been restarted as per psychiatry recommendations. -Follow-up with psychiatry as an outpatient.  Hypotension: Will start on gentle IV fluids.          DVT prophylaxis: Heparin [place] Code Status: Full Family Communication: Called mother on phone.She understands that her son might be discharged to home tomorrow Disposition Plan: Home after GI clearance   Consultants: GI  Procedures:EGD/Colonoscopy  Antimicrobials:  Anti-infectives (From admission, onward)   Start     Dose/Rate Route Frequency Ordered Stop   01/15/19 0800  cefTRIAXone (ROCEPHIN) 1 g in sodium chloride 0.9 % 100 mL IVPB  Status:  Discontinued     1 g 200 mL/hr over 30 Minutes Intravenous Daily 01/15/19 0714 01/18/19 0915   01/14/19 1400  azithromycin (ZITHROMAX) 500 mg in sodium chloride 0.9 % 250 mL IVPB  Status:  Discontinued     500 mg 250 mL/hr over 60 Minutes Intravenous Every 24 hours 01/13/19 2330 01/18/19 0915   01/14/19 1000  cefTRIAXone (ROCEPHIN) 1 g in sodium chloride 0.9 % 100 mL IVPB  Status:  Discontinued     1 g 200 mL/hr over 30 Minutes Intravenous Every 24 hours 01/13/19 2330 01/14/19 0918   01/13/19 1515  cefTRIAXone (ROCEPHIN) 1 g in sodium  chloride 0.9 % 100  mL IVPB     1 g 200 mL/hr over 30 Minutes Intravenous  Once 01/13/19 1502 01/13/19 1613   01/13/19 1515  azithromycin (ZITHROMAX) 500 mg in sodium chloride 0.9 % 250 mL IVPB     500 mg 250 mL/hr over 60 Minutes Intravenous  Once 01/13/19 1502 01/13/19 1655      Subjective:  Patient seen and examined at the bedside this morning.  Blood pressure little soft today.  Denies any abdominal pain, nausea or vomiting but complains of abdominal cramping on the right side.  He had 1 or 2 episodes of loose stools this morning and he told that he noticed pus.  Objective: Vitals:   01/23/19 0513 01/23/19 1352 01/23/19 2123 01/24/19 0630  BP: (!) 95/55 (!) 100/58 110/65 (!) 96/59  Pulse: (!) 55 66 (!) 59 (!) 55  Resp:  18 18 18   Temp: 98.7 F (37.1 C) 98.3 F (36.8 C) 98.1 F (36.7 C) 98.3 F (36.8 C)  TempSrc: Oral Oral Oral Oral  SpO2: 97% 98% 98% 98%  Weight:      Height:        Intake/Output Summary (Last 24 hours) at 01/24/2019 1046 Last data filed at 01/23/2019 1529 Gross per 24 hour  Intake -  Output 150 ml  Net -150 ml   Filed Weights   01/13/19 1400 01/20/19 0427 01/21/19 0902  Weight: 65 kg 59.7 kg 59.7 kg    Examination:   General exam: Appears calm and comfortable ,Not in distress,average built HEENT:PERRL,Oral mucosa moist, Ear/Nose normal on gross exam Respiratory system: Bilateral equal air entry, normal vesicular breath sounds, no wheezes or crackles  Cardiovascular system: S1 & S2 heard, RRR. No JVD, murmurs, rubs, gallops or clicks. Gastrointestinal system: Abdomen is nondistended, soft and nontender. No organomegaly or masses felt. Normal bowel sounds heard. Central nervous system: Alert and oriented. No focal neurological deficits.Stammers Extremities: No edema, no clubbing ,no cyanosis, distal peripheral pulses palpable. Skin: No rashes, lesions or ulcers,no icterus ,no pallor    Data Reviewed: I have personally reviewed following labs and imaging studies   CBC: Recent Labs  Lab 01/18/19 0213 01/20/19 0229 01/23/19 0238  WBC 4.3 7.4 6.5  NEUTROABS 3.1  --  5.1  HGB 9.6* 9.8* 10.7*  HCT 29.1* 31.3* 34.2*  MCV 80.8 81.3 81.0  PLT 292 366 032*   Basic Metabolic Panel: Recent Labs  Lab 01/18/19 0213 01/20/19 0229 01/23/19 0238  NA 138 133* 135  K 4.0 3.7 3.7  CL 107 102 106  CO2 24 22 22   GLUCOSE 105* 92 96  BUN <5* <5* 6  CREATININE 0.74 0.71 0.68  CALCIUM 7.6* 7.7* 8.0*  MG 2.0  --   --   PHOS 2.7  --   --    GFR: Estimated Creatinine Clearance: 107.8 mL/min (by C-G formula based on SCr of 0.68 mg/dL). Liver Function Tests: No results for input(s): AST, ALT, ALKPHOS, BILITOT, PROT, ALBUMIN in the last 168 hours. No results for input(s): LIPASE, AMYLASE in the last 168 hours. No results for input(s): AMMONIA in the last 168 hours. Coagulation Profile: No results for input(s): INR, PROTIME in the last 168 hours. Cardiac Enzymes: No results for input(s): CKTOTAL, CKMB, CKMBINDEX, TROPONINI in the last 168 hours. BNP (last 3 results) No results for input(s): PROBNP in the last 8760 hours. HbA1C: No results for input(s): HGBA1C in the last 72 hours. CBG: Recent Labs  Lab 01/23/19 0759 01/23/19 1208 01/23/19  1712 01/23/19 2121 01/24/19 0755  GLUCAP 88 128* 111* 118* 93   Lipid Profile: No results for input(s): CHOL, HDL, LDLCALC, TRIG, CHOLHDL, LDLDIRECT in the last 72 hours. Thyroid Function Tests: No results for input(s): TSH, T4TOTAL, FREET4, T3FREE, THYROIDAB in the last 72 hours. Anemia Panel: No results for input(s): VITAMINB12, FOLATE, FERRITIN, TIBC, IRON, RETICCTPCT in the last 72 hours. Sepsis Labs: No results for input(s): PROCALCITON, LATICACIDVEN in the last 168 hours.  Recent Results (from the past 240 hour(s))  Culture, blood (Routine X 2) w Reflex to ID Panel     Status: None   Collection Time: 01/14/19  9:23 PM   Specimen: BLOOD  Result Value Ref Range Status   Specimen Description BLOOD  RIGHT ARM  Final   Special Requests   Final    BOTTLES DRAWN AEROBIC AND ANAEROBIC Blood Culture adequate volume   Culture   Final    NO GROWTH 5 DAYS Performed at [facility], [address] [place], [place] [address]    Report Status 01/19/2019 FINAL  Final  Culture, blood (Routine X 2) w Reflex to ID Panel     Status: None   Collection Time: 01/14/19  9:23 PM   Specimen: BLOOD  Result Value Ref Range Status   Specimen Description BLOOD LEFT HAND  Final   Special Requests   Final    BOTTLES DRAWN AEROBIC AND ANAEROBIC Blood Culture adequate volume   Culture   Final    NO GROWTH 5 DAYS Performed at [facility], [place] [address], [place], [place] [address]    Report Status 01/19/2019 FINAL  Final  SARS CORONAVIRUS 2 (TAT 6-24 HRS) Nasopharyngeal Nasopharyngeal Swab     Status: None   Collection Time: 01/16/19  5:20 PM   Specimen: Nasopharyngeal Swab  Result Value Ref Range Status   SARS Coronavirus 2 NEGATIVE NEGATIVE Final    Comment: (NOTE) SARS-CoV-2 target nucleic acids are NOT DETECTED. The SARS-CoV-2 RNA is generally detectable in upper and lower respiratory specimens during the acute phase of infection. Negative results do not preclude SARS-CoV-2 infection, do not rule out co-infections with other pathogens, and should not be used as the sole basis for treatment or other patient management decisions. Negative results must be combined with clinical observations, patient history, and epidemiological information. The expected result is Negative. Fact Sheet for Patients: SugarRoll.be Fact Sheet for Healthcare Providers: https://www.woods-mathews.com/ This test is not yet approved or cleared by the [place] FDA and  has been authorized for detection and/or diagnosis of SARS-CoV-2 by FDA under an Emergency Use Authorization (EUA). This EUA will remain  in effect (meaning this test can be used) for the duration of the  COVID-19 declaration under Section 56 4(b)(1) of the Act, 21 U.S.C. section 360bbb-3(b)(1), unless the authorization is terminated or revoked sooner. Performed at [facility], [place] [address], [place], [place] [address]          Radiology Studies: No results found.      Scheduled Meds: . ARIPiprazole  10 mg Oral QHS  . benztropine  1 mg Oral Daily  . Chlorhexidine Gluconate Cloth  6 each Topical Daily  . citalopram  20 mg Oral QHS  . heparin  5,000 Units Subcutaneous Q8H  . influenza vac split quadrivalent PF  0.5 mL Intramuscular Tomorrow-1000  . insulin aspart  0-5 Units Subcutaneous QHS  . insulin aspart  0-9 Units Subcutaneous TID WC  . methylPREDNISolone (SOLU-MEDROL) injection  60 mg Intravenous Q24H  . sodium chloride flush  3 mL Intravenous Q12H  . traZODone  200 mg Oral QHS   Continuous Infusions: . sodium chloride    . sodium chloride 125 mL/hr at 01/16/19 0118     LOS: 11 days    Time spent: 35 mins.More than 50% of that time was spent in counseling and/or coordination of care.      Shelly Coss, MD [facility] Pager [phone]  If 7PM-7AM, please contact night-coverage [email or web address] Password [facility] 01/24/2019, 10:46 AM

## 2019-01-24 NOTE — Progress Notes (Signed)
   01/24/19 2113  MEWS Score  Resp 17  Pulse Rate 79  BP 114/61  Temp 98.7 F (37.1 C)  SpO2 95 %  O2 Device Room Air  MEWS Score  MEWS RR 0  MEWS Pulse 0  MEWS Systolic 0  MEWS LOC 0  MEWS Temp 0  MEWS Score 0  MEWS Score Color Green  MEWS Assessment  Is this an acute change? No  MEWS Guidelines - (patients age 36 and over)  Red - At High Risk for Deterioration Yellow - At risk for Deterioration  1. Go to room and assess patient 2. Validate data. Is this patient's baseline? If data confirmed: 3. Is this an acute change? 4. Administer prn meds/treatments as ordered. 5. Note Sepsis score 6. Review goals of care 7. Sports coach, RRT nurse and Provider. 8. Ask Provider to come to bedside.  9. Document patient condition/interventions/response. 10. Increase frequency of vital signs and focused assessments to at least q15 minutes x 4, then q30 minutes x2. - If stable, then q1h x3, then q4h x3 and then q8h or dept. routine. - If unstable, contact Provider & RRT nurse. Prepare for possible transfer. 11. Add entry in progress notes using the smart phrase ".MEWS". 1. Go to room and assess patient 2. Validate data. Is this patient's baseline? If data confirmed: 3. Is this an acute change? 4. Administer prn meds/treatments as ordered? 5. Note Sepsis score 6. Review goals of care 7. Sports coach and Provider 8. Call RRT nurse as needed. 9. Document patient condition/interventions/response. 10. Increase frequency of vital signs and focused assessments to at least q2h x2. - If stable, then q4h x2 and then q8h or dept. routine. - If unstable, contact Provider & RRT nurse. Prepare for possible transfer. 11. Add entry in progress notes using the smart phrase ".MEWS".  Green - Likely stable Lavender - Comfort Care Only  1. Continue routine/ordered monitoring.  2. Review goals of care. 1. Continue routine/ordered monitoring. 2. Review goals of care.

## 2019-01-24 NOTE — Plan of Care (Signed)

## 2019-01-25 LAB — GLUCOSE, CAPILLARY
Glucose-Capillary: 94 mg/dL (ref 70–99)
Glucose-Capillary: 107 mg/dL — ABNORMAL HIGH (ref 70–99)

## 2019-01-25 LAB — HEPATITIS B SURFACE ANTIGEN: Hepatitis B Surface Ag: NEGATIVE

## 2019-01-25 MED ORDER — BENZTROPINE MESYLATE 1 MG PO TABS: 1.0000 mg | ORAL_TABLET | Freq: Every day | ORAL | 0 refills | Status: DC

## 2019-01-25 MED ORDER — CITALOPRAM HYDROBROMIDE 20 MG PO TABS: 20.0000 mg | ORAL_TABLET | Freq: Every day | ORAL | 0 refills | Status: DC

## 2019-01-25 MED ORDER — PREDNISONE 10 MG PO TABS: 10.0000 mg | ORAL_TABLET | Freq: Every day | ORAL | 0 refills | Status: AC

## 2019-01-25 MED ORDER — ARIPIPRAZOLE 10 MG PO TABS: 10.0000 mg | ORAL_TABLET | Freq: Every day | ORAL | 0 refills | Status: DC

## 2019-01-25 MED ORDER — TRAZODONE HCL 100 MG PO TABS: 200.0000 mg | ORAL_TABLET | Freq: Every day | ORAL | 0 refills | Status: DC

## 2019-01-25 MED FILL — traZODone HCL 100 MG TABS: 100 | 30 days supply | Qty: 60 | Fill #0

## 2019-01-25 MED FILL — BENZTROPINE MESYLATE 1 MG T: 1 | 30 days supply | Qty: 30 | Fill #0

## 2019-01-25 MED FILL — predniSONE 10 MG TABS: 10 | 30 days supply | Qty: 150 | Fill #0

## 2019-01-25 MED FILL — CITALOPRAM HBR 20 MG TABLET: 20 | 30 days supply | Qty: 30 | Fill #0

## 2019-01-25 MED FILL — ARIPIPRAZOLE 10 MG TABS: 10 | 30 days supply | Qty: 30 | Fill #0

## 2019-01-25 NOTE — Progress Notes (Signed)
Discharge instructions and bus pass given to patient at this time.

## 2019-01-25 NOTE — Progress Notes (Signed)
CSW received request for taxi voucher due to lack of transportation. Voucher placed on shadow chart.   Percell Locus Maysel Mccolm LCSW [phone]

## 2019-01-25 NOTE — Progress Notes (Signed)
Subjective: Patient complains of diffuse body aches. Has been able to tolerate regular diet reports one episode of loose stool today. Denies nausea or abdominal pain.  Objective: Vital signs in last 24 hours: Temp:  [98.7 F (37.1 C)] 98.7 F (37.1 C) (09/24 0538) Pulse Rate:  [62-79] 62 (09/24 0538) Resp:  [17] 17 (09/24 0538) BP: (100-114)/(57-61) 100/61 (09/24 0538) SpO2:  [95 %-97 %] 95 % (09/24 0538) Weight change:  Last BM Date: 01/24/19  PE: Thinly built, chronically ill-appearing GENERAL: Prominent pallor ABDOMEN: Soft, nondistended, nontender EXTREMITIES: no Deformity  Lab Results: Results for orders placed or performed during the hospital encounter of 01/13/19 (from the past 48 hour(s))  Glucose, capillary     Status: Abnormal   Collection Time: 01/23/19 12:08 PM  Result Value Ref Range   Glucose-Capillary 128 (H) 70 - 99 mg/dL  Glucose, capillary     Status: Abnormal   Collection Time: 01/23/19  5:12 PM  Result Value Ref Range   Glucose-Capillary 111 (H) 70 - 99 mg/dL  Glucose, capillary     Status: Abnormal   Collection Time: 01/23/19  9:21 PM  Result Value Ref Range   Glucose-Capillary 118 (H) 70 - 99 mg/dL  Glucose, capillary     Status: None   Collection Time: 01/24/19  7:55 AM  Result Value Ref Range   Glucose-Capillary 93 70 - 99 mg/dL  Hepatitis B surface antigen     Status: None   Collection Time: 01/24/19 11:32 AM  Result Value Ref Range   Hepatitis B Surface Ag Negative Negative    Comment: (NOTE) Performed At: [facility] [place], [place] [id number] Rush Farmer MD DJ:[id number]   Glucose, capillary     Status: None   Collection Time: 01/24/19 11:47 AM  Result Value Ref Range   Glucose-Capillary 89 70 - 99 mg/dL  Glucose, capillary     Status: Abnormal   Collection Time: 01/24/19  5:07 PM  Result Value Ref Range   Glucose-Capillary 136 (H) 70 - 99 mg/dL  Glucose, capillary     Status: Abnormal   Collection Time:  01/24/19  9:08 PM  Result Value Ref Range   Glucose-Capillary 110 (H) 70 - 99 mg/dL  Glucose, capillary     Status: None   Collection Time: 01/25/19  7:55 AM  Result Value Ref Range   Glucose-Capillary 94 70 - 99 mg/dL    Studies/Results: No results found.  Medications: I have reviewed the patient's current medications.  Assessment: Crohn's colitis Doing well on IV Solu-Medrol  Plan: Okay to discharge home on tapering dose of oral steroids(prednisone 60 mg daily for 1 week followed by 50 mg daily for 1 week, followed by 40 mg daily for 1 week, followed by 30 mg daily for 1 week, other day 20 mg daily for 1 week, followed by 10 mg daily for 1 week).  Hepatitis B surface antigen negative. TB Gold mitogen testing pending  We will follow-up with patient in the next few weeks as an outpatient, will try to see if patient would qualify for use of Biologics or immunosuppressants.  Discussed about the importance of follow-up, regular labs, compliance with medication as an outpatient. Discussed about Crohn's disease, associated complications. Patient verbalized understanding.   Ronnette Juniper 01/25/2019, 11:37 AM   Pager [phone] If no answer or after 5 PM call [phone]

## 2019-01-25 NOTE — Plan of Care (Signed)

## 2019-01-25 NOTE — TOC Transition Note (Signed)
Transition of Care [facility]) - CM/SW Discharge Note   Patient Details  Name: Carl Carey MRN: [id number] Date of Birth: 31-Aug-1980  Transition of [facility]) CM/SW Contact:  Sharin Mons, RN Phone Number: 01/25/2019, 1:15 PM   Clinical Narrative:    Pt will transition to home to today. Unfortunately, NCM unable to secure home health provider 2/2 payor source, Medicaid. Pt to f/u with [facility] for post hospital appointment and to establish primary care, noted on AVS and pt /mom aware. Pt states has problems with getting to MD appointments, no transportation. NCM contacted DSS/ Medicaid Transportation and DSS worker stated someone from the department  will contact pt/mom concerning transportation needs. Taxi voucher will be given to pt today to assist with transportation home.  [facility] will deliver Rx meds to pt's bedside prior to discharge.  Kaya Klausing (Mother)     [place]   [phone] [place] [address]    Next Steps: Follow up on 01/30/2019    Instructions: 11 am with Dr. Tia Masker       Final next level of care: [facility](pending acceptance from home health agency) Barriers to Discharge: No Barriers Identified   Patient Goals and CMS Choice        Discharge Placement                       Discharge Plan and Services   Discharge Planning Services: CM Consult                      HH Arranged: RN, Social Work          Social Determinants of Health ([place]) Interventions     Readmission Risk Interventions No flowsheet data found.

## 2019-01-25 NOTE — Discharge Summary (Signed)
Physician Discharge Summary  Keymani Mclean MKL:[id number] DOB: November 28, 1980 DOA: 01/13/2019  PCP: Patient, No Pcp Per  Admit date: 01/13/2019 Discharge date: 01/25/2019  Admitted From: Home Disposition:  Home  Discharge Condition:Stable CODE STATUS:FULL Diet recommendation: Regular  Brief/Interim Summary:  38 year old male with history of bipolar disorder presented on 01/13/2019 with shortness of breath and respiratory distress along with altered mental status. He was apparently found by family, lethargic but vomiting. Patient had recent admission to [facility] for gastroenteritis and hyponatremia and reportedly had enteritis on CT scan at that time with negative stool culture/stool for C. difficile and GI PCR. On presentation, he was found to have elevated lactic with leukocytosis. He was started on IV fluids and broad-spectrum antibiotics for possible pneumonia.Now off antibiotics . Patient underwent EGD and colonoscopy on 01/21/19. EGD revealed esophagitis, gastric and pyloric ulcers. Colonoscopy revealed terminal ileal stenosis and scattered rectal ulcers.  CT enterography was suggestive  of Crohn's disease.  Started on IV steroids and has been changed to prednisone. He is hemodynamically stable for discharge to home today.  Following problems were addressed during his hospitalization:  Community-acquired pneumonia Acute hypoxic respiratory failure: RequiredBiPAP initially -Completed course of Rocephin and Zithromax. -Blood cultures negative.Urine streptococcal antigen negative. Respiratory panel PCR negative. COVID-19 test negative. -Urine Legionella antigennegative. --2D echo showed EF of 50 to 55% with no valvular vegetation visualized. - Repeat CT of the chest abdomen and pelvison 01/16/2019 showed multiple lung opacities bilaterally, predominantly right lung consistent with multifocal pneumonia. -Respiratory status stable now. On room air  Abdominal  pain/diarrhea/Chron's disease -Negative work-up  including stool for C. difficile/GI PCR and cultures. Had recent CT which showed multiple lung opacities as mentioned above, diffuse wall thickening of the esophagus, severe wall thickening of the terminal ileum. Concern for inflammatory bowel disease. -Consulted gastroenterology.Underwent EGD and colonoscopy with findings as above -CT enterography concerning for Crohn's disease.  Started on steroids. -Fever was most likely associated with inflammatory disease rather than infectious etiology.fever has resolved -  He needs long-term and outpatient management for Crohn's colitis likely with biological therapy and immunosuppressants.  TB/hepatitis B surface antigen sent.  GI will arrange appointment for him as an outpatient.  History of bipolar disorder -Patient is on multiple medications.Medications have been restarted as per psychiatry recommendations. -Follow-up with psychiatry as an outpatient.     Discharge Diagnoses:  Principal Problem:   Bipolar disorder, in partial remission, most recent episode manic ([place]) Active Problems:   Septic shock [facility])    Discharge Instructions  Discharge Instructions    Diet - low sodium heart healthy   Complete by: As directed    Discharge instructions   Complete by: As directed    1)Take prescribed medications as instructed. 2)Follow up at [facility] on Sep 29th. 3)You will be called by gastroenterology for follow up. 4)Follow up with psychiatry as an outpatient.   Increase activity slowly   Complete by: As directed      Allergies as of 01/25/2019   No Known Allergies     Medication List    STOP taking these medications   haloperidol 5 MG tablet Commonly known as: HALDOL   haloperidol decanoate 100 MG/ML injection Commonly known as: HALDOL DECANOATE   Sleep Aid 25 MG tablet Generic drug: doxylamine (Sleep)     TAKE these medications    ARIPiprazole 10 MG tablet Commonly known as: ABILIFY Take 1 tablet (10 mg total) by mouth at bedtime.   benztropine 1 MG tablet  Commonly known as: COGENTIN Take 1 tablet (1 mg total) by mouth daily. Start taking on: January 26, 2019 What changed: when to take this   citalopram 20 MG tablet Commonly known as: CELEXA Take 1 tablet (20 mg total) by mouth at bedtime.   predniSONE 10 MG tablet Commonly known as: DELTASONE Take 1 tablet (10 mg total) by mouth daily for 1 dose. Take 6 pills daily for 1 week then 5 pills daily for 1 week then 4 pills daily for 1 week then 3 pills  daily for 1 week then 2 pills daily for 1 week then 1 pill daily for 1 week then stop   traZODone 100 MG tablet Commonly known as: DESYREL Take 2 tablets (200 mg total) by mouth at bedtime. What changed:   medication strength  how much to take  when to take this      [place] Follow up on 01/30/2019.   Why: 11 am with Dr. Tia Masker Contact information: [place] [id number] [phone]       Home, [facility] Follow up.   Specialty: Home Health Services Why: home health services arranged Contact information: [place] [address] [phone]          No Known Allergies  Consultations:  GI   Procedures/Studies: Ct Chest W Contrast  Result Date: 01/16/2019 CLINICAL DATA:  Fever of unknown origin.  Shortness of breath. EXAM: CT CHEST, ABDOMEN, AND PELVIS WITH CONTRAST TECHNIQUE: Multidetector CT imaging of the chest, abdomen and pelvis was performed following the standard protocol during bolus administration of intravenous contrast. CONTRAST:  141m OMNIPAQUE IOHEXOL 300 MG/ML  SOLN COMPARISON:  None. FINDINGS: CT CHEST FINDINGS Cardiovascular: No significant vascular findings. Normal heart size. No pericardial effusion. Mediastinum/Nodes: Thyroid gland is unremarkable. No  adenopathy is noted. Diffuse wall thickening of esophagus is noted concerning for inflammation. Lungs/Pleura: No pneumothorax or pleural effusion is noted. Multiple ill-defined airspace opacities are noted throughout both lungs most consistent with multifocal pneumonia. Mild left basilar opacities are noted concerning for pneumonia. Musculoskeletal: No chest wall mass or suspicious bone lesions identified. CT ABDOMEN PELVIS FINDINGS Hepatobiliary: No focal liver abnormality is seen. No gallstones, gallbladder wall thickening, or biliary dilatation. Pancreas: Unremarkable. No pancreatic ductal dilatation or surrounding inflammatory changes. Spleen: Normal in size without focal abnormality. Adrenals/Urinary Tract: Adrenal glands appear normal. Right renal cyst is noted. No hydronephrosis or renal obstruction is noted. Urinary bladder is unremarkable. No renal or ureteral calculi are noted. Stomach/Bowel: The stomach appears normal. There is no evidence of bowel obstruction. The appendix appears normal. Severe wall thickening of the terminal ileum is noted consistent with inflammation, such as Crohn's disease. Vascular/Lymphatic: No significant vascular findings are present. No enlarged abdominal or pelvic lymph nodes. Reproductive: Prostate is unremarkable. Other: No abdominal wall hernia or abnormality. No abdominopelvic ascites. Musculoskeletal: No acute or significant osseous findings. IMPRESSION: Multiple lung opacities are noted bilaterally, but predominantly and diffusely in the right lung, most consistent with multifocal pneumonia. Diffuse wall thickening of the esophagus is noted concerning for inflammation; endoscopy is recommended for further evaluation. Severe wall thickening of terminal ileum is noted concerning for inflammation, such as inflammatory bowel disease or Crohn's disease. Electronically Signed   By: JMarijo ConceptionM.D.   On: 01/16/2019 14:03   Ct Abdomen Pelvis W Contrast  Result Date:  01/16/2019 CLINICAL DATA:  Fever of unknown origin.  Shortness of breath. EXAM: CT CHEST, ABDOMEN, AND PELVIS WITH CONTRAST TECHNIQUE: Multidetector CT imaging of the chest, abdomen and pelvis was performed following the standard protocol during bolus administration of intravenous contrast. CONTRAST:  159m OMNIPAQUE IOHEXOL 300 MG/ML  SOLN COMPARISON:  None. FINDINGS: CT CHEST FINDINGS Cardiovascular: No significant vascular findings. Normal heart size. No pericardial effusion. Mediastinum/Nodes: Thyroid gland is unremarkable. No adenopathy is noted. Diffuse wall thickening of esophagus is noted concerning for inflammation. Lungs/Pleura: No pneumothorax or pleural effusion is noted. Multiple ill-defined airspace opacities are noted throughout both lungs most consistent with multifocal pneumonia. Mild left basilar opacities are noted concerning for pneumonia. Musculoskeletal: No chest wall mass or suspicious bone lesions identified. CT ABDOMEN PELVIS FINDINGS Hepatobiliary: No focal liver abnormality is seen. No gallstones, gallbladder wall thickening, or biliary dilatation. Pancreas: Unremarkable. No pancreatic ductal dilatation or surrounding inflammatory changes. Spleen: Normal in size without focal abnormality. Adrenals/Urinary Tract: Adrenal glands appear normal. Right renal cyst is noted. No hydronephrosis or renal obstruction is noted. Urinary bladder is unremarkable. No renal or ureteral calculi are noted. Stomach/Bowel: The stomach appears normal. There is no evidence of bowel obstruction. The appendix appears normal. Severe wall thickening of the terminal ileum is noted consistent with inflammation, such as Crohn's disease. Vascular/Lymphatic: No significant vascular findings are present. No enlarged abdominal or pelvic lymph nodes. Reproductive: Prostate is unremarkable. Other: No abdominal wall hernia or abnormality. No abdominopelvic ascites. Musculoskeletal: No acute or significant osseous findings.  IMPRESSION: Multiple lung opacities are noted bilaterally, but predominantly and diffusely in the right lung, most consistent with multifocal pneumonia. Diffuse wall thickening of the esophagus is noted concerning for inflammation; endoscopy is recommended for further evaluation. Severe wall thickening of terminal ileum is noted concerning for inflammation, such as inflammatory bowel disease or Crohn's disease. Electronically Signed   By: JMarijo ConceptionM.D.   On: 01/16/2019 14:03   Ct Entero Abd/pelvis W Contast  Result Date: 01/21/2019 CLINICAL DATA:  Inflammatory bowel disease EXAM: CT ABDOMEN AND PELVIS WITH CONTRAST (ENTEROGRAPHY) TECHNIQUE: Multidetector CT of the abdomen and pelvis during bolus administration of intravenous contrast. Negative oral contrast was given. CONTRAST:  1079mOMNIPAQUE IOHEXOL 300 MG/ML  SOLN COMPARISON:  CT chest abdomen pelvis, 01/16/2019 FINDINGS: Lower chest: There is extensive, right greater than left bibasilar heterogeneous and consolidative airspace disease, which is worsened in comparison to recent CT dated 01/16/2019. Hepatobiliary: No solid liver abnormality is seen. No gallstones, gallbladder wall thickening, or biliary dilatation. Pancreas: Unremarkable. No pancreatic ductal dilatation or surrounding inflammatory changes. Spleen: Normal in size without significant abnormality. Adrenals/Urinary Tract: Adrenal glands are unremarkable. Kidneys are normal, without renal calculi, solid lesion, or hydronephrosis. Bladder is unremarkable. Stomach/Bowel: Stomach is within normal limits. Appendix appears normal. The small bowel and colon are fluid-filled by negative contrast to the rectum without overt distention. There is redemonstrated thickening and luminal narrowing of two segments of terminal ileum involving the distal ~30 cm of the ileum separated by a short segment of relatively normal appearing bowel, this overall appearance with somewhat fixed, tethered configuration  in comparison to prior examination (series 3, image 51, series 6, image 66, 75). Vascular/Lymphatic: No significant vascular findings are present. No enlarged abdominal or pelvic lymph nodes. Reproductive: No mass or other significant abnormality. Other: No abdominal wall hernia or abnormality. Small volume free fluid in the right lower quadrant and low pelvis. Musculoskeletal: There is sclerosis of the bilateral sacroiliac joints (series 4, image 157). IMPRESSION: 1. There is redemonstrated thickening and  luminal narrowing of two segments of terminal ileum involving the distal ~30 cm of the ileum separated by a short segment of relatively normal appearing bowel, this overall appearance with somewhat fixed, tethered configuration in comparison to prior examination (series 3, image 51, series 6, image 66, 75). Findings are consistent with infectious or inflammatory enteritis, particularly Crohn's disease given this appearance and distribution. The involved segments are suspicious for stricture although there is no evidence of overt bowel obstruction. No evidence of fistula or complication by abscess formation. 2. The remainder of the bowel is normal in appearance, fluid filled by negative enteric contrast. 3. Small volume free fluid in the right lower quadrant and low pelvis, likely reactive. 4. There is extensive, right greater than left bibasilar heterogeneous and consolidative airspace disease, which is worsened in comparison to recent CT dated 01/16/2019 and consistent with multifocal infection. 5. There is sclerosis of the bilateral sacroiliac joints (series 4, image 157), advanced for patient age and consistent with inflammatory sacroiliitis, as can be seen in association with inflammatory bowel disease. Electronically Signed   By: Eddie Candle M.D.   On: 01/21/2019 14:19   Dg Chest Port 1 View  Result Date: 01/15/2019 CLINICAL DATA:  Acute respiratory failure with hypoxia. EXAM: PORTABLE CHEST 1 VIEW  COMPARISON:  Radiographs of January 13, 2019. FINDINGS: The heart size and mediastinal contours are within normal limits. No pneumothorax or pleural effusion is noted. Left lung is clear. Stable right lower lobe airspace opacity is noted with increased right upper lobe airspace opacity. The visualized skeletal structures are unremarkable. IMPRESSION: Increased right upper lobe airspace opacity is noted concerning for worsening pneumonia. Stable right lower lobe opacity is noted. Electronically Signed   By: Marijo Conception M.D.   On: 01/15/2019 08:20   Dg Chest Portable 1 View  Result Date: 01/13/2019 CLINICAL DATA:  Acute shortness of breath.  Vomiting. EXAM: PORTABLE CHEST 1 VIEW COMPARISON:  12/10/2017 FINDINGS: Diffuse airspace opacities within the mid and lower RIGHT lung noted. Equivocal medial LEFT basilar opacity noted. The cardiomediastinal silhouette is unremarkable. No pleural effusion, pneumothorax or acute bony abnormality. IMPRESSION: Diffuse airspace opacities within the mid and lower RIGHT lung and possibly within the LEFT lung base, suspicious for pneumonia/infection or possibly aspiration. Electronically Signed   By: Margarette Canada M.D.   On: 01/13/2019 15:12       Subjective: Patient seen and examined the bedside this morning.  Hemodynamically stable for discharge.  Discharge Exam: Vitals:   01/25/19 0538 01/25/19 1215  BP: 100/61 107/61  Pulse: 62 68  Resp: 17 16  Temp: 98.7 F (37.1 C) 98.5 F (36.9 C)  SpO2: 95% 96%   Vitals:   01/24/19 1456 01/24/19 2113 01/25/19 0538 01/25/19 1215  BP: (!) 109/57 114/61 100/61 107/61  Pulse: 69 79 62 68  Resp: 17 17 17 16   Temp: 98.7 F (37.1 C) 98.7 F (37.1 C) 98.7 F (37.1 C) 98.5 F (36.9 C)  TempSrc: Oral Oral  Oral  SpO2: 97% 95% 95% 96%  Weight:      Height:        General: Pt is alert, awake, not in acute distress Cardiovascular: RRR, S1/S2 +, no rubs, no gallops Respiratory: CTA bilaterally, no wheezing, no  rhonchi Abdominal: Soft, NT, ND, bowel sounds + Extremities: no edema, no cyanosis    The results of significant diagnostics from this hospitalization (including imaging, microbiology, ancillary and laboratory) are listed below for reference.     Microbiology: Recent  Results (from the past 240 hour(s))  SARS CORONAVIRUS 2 (TAT 6-24 HRS) Nasopharyngeal Nasopharyngeal Swab     Status: None   Collection Time: 01/16/19  5:20 PM   Specimen: Nasopharyngeal Swab  Result Value Ref Range Status   SARS Coronavirus 2 NEGATIVE NEGATIVE Final    Comment: (NOTE) SARS-CoV-2 target nucleic acids are NOT DETECTED. The SARS-CoV-2 RNA is generally detectable in upper and lower respiratory specimens during the acute phase of infection. Negative results do not preclude SARS-CoV-2 infection, do not rule out co-infections with other pathogens, and should not be used as the sole basis for treatment or other patient management decisions. Negative results must be combined with clinical observations, patient history, and epidemiological information. The expected result is Negative. Fact Sheet for Patients: SugarRoll.be Fact Sheet for Healthcare Providers: https://www.woods-mathews.com/ This test is not yet approved or cleared by the [place] FDA and  has been authorized for detection and/or diagnosis of SARS-CoV-2 by FDA under an Emergency Use Authorization (EUA). This EUA will remain  in effect (meaning this test can be used) for the duration of the COVID-19 declaration under Section 56 4(b)(1) of the Act, 21 U.S.C. section 360bbb-3(b)(1), unless the authorization is terminated or revoked sooner. Performed at [facility], [place] [address], [place], [place] [address]      Labs: BNP (last 3 results) No results for input(s): BNP in the last 8760 hours. Basic Metabolic Panel: Recent Labs  Lab 01/20/19 0229 01/23/19 0238  NA 133* 135  K 3.7 3.7   CL 102 106  CO2 22 22  GLUCOSE 92 96  BUN <5* 6  CREATININE 0.71 0.68  CALCIUM 7.7* 8.0*   Liver Function Tests: No results for input(s): AST, ALT, ALKPHOS, BILITOT, PROT, ALBUMIN in the last 168 hours. No results for input(s): LIPASE, AMYLASE in the last 168 hours. No results for input(s): AMMONIA in the last 168 hours. CBC: Recent Labs  Lab 01/20/19 0229 01/23/19 0238  WBC 7.4 6.5  NEUTROABS  --  5.1  HGB 9.8* 10.7*  HCT 31.3* 34.2*  MCV 81.3 81.0  PLT 366 570*   Cardiac Enzymes: No results for input(s): CKTOTAL, CKMB, CKMBINDEX, TROPONINI in the last 168 hours. BNP: Invalid input(s): POCBNP CBG: Recent Labs  Lab 01/24/19 1147 01/24/19 1707 01/24/19 2108 01/25/19 0755 01/25/19 1150  GLUCAP 89 136* 110* 94 107*   D-Dimer No results for input(s): DDIMER in the last 72 hours. Hgb A1c No results for input(s): HGBA1C in the last 72 hours. Lipid Profile No results for input(s): CHOL, HDL, LDLCALC, TRIG, CHOLHDL, LDLDIRECT in the last 72 hours. Thyroid function studies No results for input(s): TSH, T4TOTAL, T3FREE, THYROIDAB in the last 72 hours.  Invalid input(s): FREET3 Anemia work up No results for input(s): VITAMINB12, FOLATE, FERRITIN, TIBC, IRON, RETICCTPCT in the last 72 hours. Urinalysis    Component Value Date/Time   COLORURINE YELLOW 01/13/2019 1656   APPEARANCEUR HAZY (A) 01/13/2019 1656   LABSPEC 1.014 01/13/2019 1656   PHURINE 5.0 01/13/2019 1656   GLUCOSEU >=500 (A) 01/13/2019 1656   HGBUR MODERATE (A) 01/13/2019 1656   BILIRUBINUR NEGATIVE 01/13/2019 1656   KETONESUR NEGATIVE 01/13/2019 1656   PROTEINUR 100 (A) 01/13/2019 1656   UROBILINOGEN 0.2 11/17/2014 1930   NITRITE NEGATIVE 01/13/2019 1656   LEUKOCYTESUR NEGATIVE 01/13/2019 1656   Sepsis Labs Invalid input(s): PROCALCITONIN,  WBC,  LACTICIDVEN Microbiology Recent Results (from the past 240 hour(s))  SARS CORONAVIRUS 2 (TAT 6-24 HRS) Nasopharyngeal Nasopharyngeal Swab  Status:  None   Collection Time: 01/16/19  5:20 PM   Specimen: Nasopharyngeal Swab  Result Value Ref Range Status   SARS Coronavirus 2 NEGATIVE NEGATIVE Final    Comment: (NOTE) SARS-CoV-2 target nucleic acids are NOT DETECTED. The SARS-CoV-2 RNA is generally detectable in upper and lower respiratory specimens during the acute phase of infection. Negative results do not preclude SARS-CoV-2 infection, do not rule out co-infections with other pathogens, and should not be used as the sole basis for treatment or other patient management decisions. Negative results must be combined with clinical observations, patient history, and epidemiological information. The expected result is Negative. Fact Sheet for Patients: SugarRoll.be Fact Sheet for Healthcare Providers: https://www.woods-mathews.com/ This test is not yet approved or cleared by the [place] FDA and  has been authorized for detection and/or diagnosis of SARS-CoV-2 by FDA under an Emergency Use Authorization (EUA). This EUA will remain  in effect (meaning this test can be used) for the duration of the COVID-19 declaration under Section 56 4(b)(1) of the Act, 21 U.S.C. section 360bbb-3(b)(1), unless the authorization is terminated or revoked sooner. Performed at [facility], [place] [address], [place], [place] [address]     Please note: You were cared for by a hospitalist during your hospital stay. Once you are discharged, your primary care physician will handle any further medical issues. Please note that NO REFILLS for any discharge medications will be authorized once you are discharged, as it is imperative that you return to your primary care physician (or establish a relationship with a primary care physician if you do not have one) for your post hospital discharge needs so that they can reassess your need for medications and monitor your lab values.    Time coordinating discharge: 40  minutes  SIGNED:   Shelly Coss, MD  [facility] 01/25/2019, 1:06 PM Pager [phone]  If 7PM-7AM, please contact night-coverage [email or web address] Password [email or web address]

## 2019-01-25 NOTE — Progress Notes (Signed)
Physical Therapy Treatment Patient Details Name: Carl Carey MRN: [id number] DOB: October 05, 1980 Today's Date: 01/25/2019    History of Present Illness 38 year old male with history of bipolar disorder presented on 01/13/2019 with shortness of breath and respiratory distress along with altered mental status.  He was apparently found by family, lethargic but vomiting.  Patient had recent admission to [facility] for gastroenteritis and hyponatremia.  Admitted for CAP.    PT Comments    Pt supine in bed on entry, agreeable to therapy. Pt reports possible discharge from hospital today and that he "doesn't feel ready." due to weakness. Pt educated on fact he is independent with bed mobility, transfers and ambulation of 450 feet with out AD. Pt also mod I for ascent/descent of 4 steps with L sided rail. Pt educated on hourly activity to gradually improved strength and endurance. Pt has met his PT goals, education complete and discharged from acute PT services.    Follow Up Recommendations  Supervision - Intermittent;No PT follow up     Equipment Recommendations  None recommended by PT    Recommendations for Other Services Other (comment)(social worker consult)     Precautions / Restrictions Precautions Precautions: Fall Restrictions Weight Bearing Restrictions: No    Mobility  Bed Mobility Overal bed mobility: Independent                Transfers Overall transfer level: Independent Equipment used: None Transfers: Sit to/from [employer] transfer comment: c/o of minor dizziness which quickly disipated   Ambulation/Gait Ambulation/Gait assistance: Independent Gait Distance (Feet): 450 Feet Assistive device: None Gait Pattern/deviations: Step-through pattern Gait velocity: WFL Gait velocity interpretation: 1.31 - 2.62 ft/sec, indicative of limited community ambulator General Gait Details: independent, slightly decreased cadence but  WFL   Stairs Stairs: Yes Stairs assistance: Modified independent (Device/Increase time) Stair Management: One rail Left;Forwards;Alternating pattern Number of Stairs: 4 General stair comments: mod I for ascent/descent of 4 steps with slow, steady pace   Wheelchair Mobility    Modified Rankin (Stroke Patients Only)       Balance     Sitting balance-Leahy Scale: Normal       Standing balance-Leahy Scale: Good                              Cognition Arousal/Alertness: Awake/alert Behavior During Therapy: WFL for tasks assessed/performed Overall Cognitive Status: No family/caregiver present to determine baseline cognitive functioning                                 General Comments: WFL for mobility tasks during session         General Comments General comments (skin integrity, edema, etc.): VSS      Pertinent Vitals/Pain Pain Assessment: No/denies pain           PT Goals (current goals can now be found in the care plan section) Acute Rehab PT Goals PT Goal Formulation: With patient Time For Goal Achievement: 01/30/19 Potential to Achieve Goals: Good Progress towards PT goals: Goals met/education completed, patient discharged from PT    Frequency    Min 3X/week      PT Plan Current plan remains appropriate       AM-PAC PT "6 Clicks" Mobility   Outcome Measure  Help needed turning from your back to your side while  in a flat bed without using bedrails?: None Help needed moving from lying on your back to sitting on the side of a flat bed without using bedrails?: None Help needed moving to and from a bed to a chair (including a wheelchair)?: None Help needed standing up from a chair using your arms (e.g., wheelchair or bedside chair)?: None Help needed to walk in hospital room?: None Help needed climbing 3-5 steps with a railing? : A Little 6 Click Score: 23    End of Session Equipment Utilized During Treatment: Gait  belt Activity Tolerance: Patient tolerated treatment well Patient left: in bed;with call bell/phone within reach Nurse Communication: Mobility status PT Visit Diagnosis: Other abnormalities of gait and mobility (R26.89);Other symptoms and signs involving the nervous system (F02.774)     Time: 1287-8676 PT Time Calculation (min) (ACUTE ONLY): 16 min  Charges:  $Gait Training: 8-22 mins                     Nahom Carfagno B. Migdalia Dk PT, DPT Acute Rehabilitation Services Pager [phone] Office [phone]    [place] 01/25/2019, 11:18 AM

## 2019-01-25 NOTE — Plan of Care (Signed)
  Problem: Education: Goal: Knowledge of General Education information will improve Description: Including pain rating scale, medication(s)/side effects and non-pharmacologic comfort measures 01/25/2019 1315 by Rance Muir, RN Outcome: Adequate for Discharge 01/25/2019 1220 by Rance Muir, RN Outcome: Progressing   Problem: Health Behavior/Discharge Planning: Goal: Ability to manage health-related needs will improve 01/25/2019 1315 by Rance Muir, RN Outcome: Adequate for Discharge 01/25/2019 1220 by Rance Muir, RN Outcome: Progressing   Problem: Clinical Measurements: Goal: Ability to maintain clinical measurements within normal limits will improve 01/25/2019 1315 by Rance Muir, RN Outcome: Adequate for Discharge 01/25/2019 1220 by Rance Muir, RN Outcome: Progressing Goal: Will remain free from infection 01/25/2019 1315 by Rance Muir, RN Outcome: Adequate for Discharge 01/25/2019 1220 by Rance Muir, RN Outcome: Progressing Goal: Diagnostic test results will improve 01/25/2019 1315 by Rance Muir, RN Outcome: Adequate for Discharge 01/25/2019 1220 by Rance Muir, RN Outcome: Progressing Goal: Respiratory complications will improve 01/25/2019 1315 by Rance Muir, RN Outcome: Adequate for Discharge 01/25/2019 1220 by Rance Muir, RN Outcome: Progressing Goal: Cardiovascular complication will be avoided 01/25/2019 1315 by Rance Muir, RN Outcome: Adequate for Discharge 01/25/2019 1220 by Rance Muir, RN Outcome: Progressing   Problem: Activity: Goal: Risk for activity intolerance will decrease 01/25/2019 1315 by Rance Muir, RN Outcome: Adequate for Discharge 01/25/2019 1220 by Rance Muir, RN Outcome: Progressing   Problem: Nutrition: Goal: Adequate nutrition will be maintained 01/25/2019 1315 by Rance Muir, RN Outcome: Adequate for Discharge 01/25/2019 1220 by Rance Muir, RN Outcome: Progressing   Problem: Coping: Goal: Level of anxiety will decrease 01/25/2019 1315 by Rance Muir, RN Outcome: Adequate for  Discharge 01/25/2019 1220 by Rance Muir, RN Outcome: Progressing   Problem: Elimination: Goal: Will not experience complications related to bowel motility 01/25/2019 1315 by Rance Muir, RN Outcome: Adequate for Discharge 01/25/2019 1220 by Rance Muir, RN Outcome: Progressing Goal: Will not experience complications related to urinary retention 01/25/2019 1315 by Rance Muir, RN Outcome: Adequate for Discharge 01/25/2019 1220 by Rance Muir, RN Outcome: Progressing   Problem: Pain Managment: Goal: General experience of comfort will improve 01/25/2019 1315 by Rance Muir, RN Outcome: Adequate for Discharge 01/25/2019 1220 by Rance Muir, RN Outcome: Progressing   Problem: Safety: Goal: Ability to remain free from injury will improve 01/25/2019 1315 by Rance Muir, RN Outcome: Adequate for Discharge 01/25/2019 1220 by Rance Muir, RN Outcome: Progressing   Problem: Acute Rehab PT Goals(only PT should resolve) Goal: Patient Will Transfer Sit To/From Stand Outcome: Adequate for Discharge Goal: Pt Will Ambulate Outcome: Adequate for Discharge Goal: Pt Will Go Up/Down Stairs Outcome: Adequate for Discharge

## 2019-01-26 LAB — QUANTIFERON-TB GOLD PLUS (RQFGPL)
QuantiFERON Mitogen Value: 0.1 IU/mL
QuantiFERON Nil Value: 0.07 IU/mL
QuantiFERON TB1 Ag Value: 0.09 IU/mL
QuantiFERON TB2 Ag Value: 0.09 IU/mL

## 2019-01-26 LAB — QUANTIFERON-TB GOLD PLUS: QuantiFERON-TB Gold Plus: UNDETERMINED — AB

## 2019-01-30 ENCOUNTER — Inpatient Hospital Stay: Payer: Medicaid Other | Admitting: Critical Care Medicine

## 2019-01-30 NOTE — Progress Notes (Deleted)
   Subjective:    Patient ID: Carl Carey, male    DOB: 1980/08/03, 38 y.o.   MRN: [id number]  Admit date: 01/13/2019 Discharge date: 01/25/2019  Admitted From: Home Disposition:  Home  Discharge Condition:Stable CODE STATUS:FULL Diet recommendation: Regular  Brief/Interim Summary:  38 year old male with history of bipolar disorder presented on 01/13/2019 with shortness of breath and respiratory distress along with altered mental status. He was apparently found by family, lethargic but vomiting. Patient had recent admission to [facility] for gastroenteritis and hyponatremia and reportedly had enteritis on CT scan at that time with negative stool culture/stool for C. difficile and GI PCR. On presentation, he was found to have elevated lactic with leukocytosis. He was started on IV fluids and broad-spectrum antibiotics for possible pneumonia.Now off antibiotics . Patient underwent EGD and colonoscopy on 01/21/19. EGD revealed esophagitis, gastric and pyloric ulcers. Colonoscopy revealed terminal ileal stenosis and scattered rectal ulcers. CT enterography was suggestive of Crohn's disease. Started on IV steroids and has been changed to prednisone. He is hemodynamically stable for discharge to home today.  Following problems were addressed during his hospitalization:  Community-acquired pneumonia Acute hypoxic respiratory failure: RequiredBiPAP initially -Completed course of Rocephin and Zithromax. -Blood cultures negative.Urine streptococcal antigen negative. Respiratory panel PCR negative. COVID-19 test negative. -Urine Legionella antigennegative. --2D echo showed EF of 50 to 55% with no valvular vegetation visualized. - Repeat CT of the chest abdomen and pelvison 01/16/2019 showed multiple lung opacities bilaterally, predominantly right lung consistent with multifocal pneumonia. -Respiratory status stable now. On room air  Abdominal pain/diarrhea/Chron's  disease -Negative work-up including stool for C. difficile/GI PCR and cultures. Had recent CT which showed multiple lung opacities as mentioned above, diffuse wall thickening of the esophagus, severe wall thickening of the terminal ileum. Concern for inflammatory bowel disease. -Consulted gastroenterology.Underwent EGD and colonoscopy with findings as above -CT enterography concerning for Crohn's disease. Started on steroids. -Fever was most likely associated with inflammatory disease rather than infectious etiology.fever has resolved - He needs long-term and outpatient management for Crohn's colitis likely with biological therapy and immunosuppressants. TB/hepatitis B surface antigen sent.  GI will arrange appointment for him as an outpatient.  History of bipolar disorder -Patient is on multiple medications.Medications have been restarted as per psychiatry recommendations. -Follow-upwith psychiatry as anoutpatient.     Discharge Diagnoses:  Principal Problem:   Bipolar disorder, in partial remission, most recent episode manic ([place]) Active Problems:   Septic shock ([place])       Review of Systems     Objective:   Physical Exam        Assessment & Plan:

## 2019-02-23 ENCOUNTER — Encounter: Payer: Self-pay | Admitting: Family Medicine

## 2019-02-23 ENCOUNTER — Ambulatory Visit: Payer: Medicaid Other | Attending: Critical Care Medicine | Admitting: Family Medicine

## 2019-02-23 ENCOUNTER — Other Ambulatory Visit: Payer: Self-pay

## 2019-02-23 VITALS — BP 115/69 | HR 85 | Temp 98.5°F | Wt 145.0 lb

## 2019-02-23 DIAGNOSIS — F3173 Bipolar disorder, in partial remission, most recent episode manic: Secondary | ICD-10-CM

## 2019-02-23 DIAGNOSIS — Z789 Other specified health status: Secondary | ICD-10-CM

## 2019-02-23 DIAGNOSIS — K253 Acute gastric ulcer without hemorrhage or perforation: Secondary | ICD-10-CM

## 2019-02-23 DIAGNOSIS — E43 Unspecified severe protein-calorie malnutrition: Secondary | ICD-10-CM

## 2019-02-23 DIAGNOSIS — Z23 Encounter for immunization: Secondary | ICD-10-CM | POA: Diagnosis not present

## 2019-02-23 DIAGNOSIS — K50118 Crohn's disease of large intestine with other complication: Secondary | ICD-10-CM

## 2019-02-23 DIAGNOSIS — K209 Esophagitis, unspecified without bleeding: Secondary | ICD-10-CM

## 2019-02-23 DIAGNOSIS — K529 Noninfective gastroenteritis and colitis, unspecified: Secondary | ICD-10-CM

## 2019-02-23 DIAGNOSIS — D509 Iron deficiency anemia, unspecified: Secondary | ICD-10-CM

## 2019-02-23 DIAGNOSIS — Z09 Encounter for follow-up examination after completed treatment for conditions other than malignant neoplasm: Secondary | ICD-10-CM

## 2019-02-23 MED ORDER — PANTOPRAZOLE SODIUM 40 MG PO TBEC: 40.0000 mg | DELAYED_RELEASE_TABLET | Freq: Two times a day (BID) | ORAL | 3 refills | Status: DC

## 2019-02-23 MED FILL — PANTOPRAZOLE SOD DR 40 MG T: 40 | 30 days supply | Qty: 60 | Fill #0

## 2019-02-23 NOTE — Progress Notes (Signed)
New Patient Office Visit  Subjective:  Patient ID: Carl Carey, male    DOB: 03/29/1981  Age: 38 y.o. MRN: [id number]  CC: Diarrhea  HPI Carl Carey, 38 yo male, seen to establish care status post hospitalization from 01/13/2019-01/25/2019.  Patient was found to have multiple medical issues including community-acquired pneumonia causing acute hypoxic respiratory failure which required BiPAP initially and patient was treated with Rocephin and Zithromax.  Patient also had EGD and colonoscopy on 01/21/2019 which showed esophagitis, gastric and pyloric ulcers and colonoscopy with terminal ileal stenosis and scattered rectal ulcers.  CT enterography was suggestive of Crohn's disease and patient was started on IV steroids and discharged on prednisone.  On the day of hospitalization, patient had been found at home by family members and patient was lethargic and vomiting at the time.  Patient also has had longstanding issues with bipolar disorder.         At today's visit, patient reports continued issues with chronic diarrhea/loose stools.  These occur multiple times daily and are not related to intake of food.  He denies abdominal pain at this time.  He denies difficulty swallowing but then states that food sometimes feels stuck in his chest area. (Patient was somewhat difficult to understand as he had a stutter and I am not sure if this is long-term or if patient is having any issues with tardive dyskinesia or medication reaction.)  Patient reports that he came to the apartment by himself with the use of public transportation.  He denies any shortness of breath or cough, no chest pain or palpitations.  No recent fever or chills.  No current headache or dizziness.  He denies any blood in the stool/diarrhea and no black or dark diarrhea.  Past Medical History:  Diagnosis Date  . Bipolar 1 disorder ([place])   . Diabetes ([place])   . Malnutrition ([place])   . Panic attack   . Parkinsonism [facility])     Past  Surgical History:  Procedure Laterality Date  . BIOPSY  01/21/2019   Procedure: BIOPSY;  Surgeon: Otis Brace, MD;  Location: [place];  Service: Gastroenterology;;  . COLONOSCOPY WITH PROPOFOL N/A 01/21/2019   Procedure: COLONOSCOPY WITH PROPOFOL;  Surgeon: Otis Brace, MD;  Location: [place];  Service: Gastroenterology;  Laterality: N/A;  . ESOPHAGOGASTRODUODENOSCOPY (EGD) WITH PROPOFOL N/A 01/21/2019   Procedure: ESOPHAGOGASTRODUODENOSCOPY (EGD) WITH PROPOFOL;  Surgeon: Otis Brace, MD;  Location: [facility];  Service: Gastroenterology;  Laterality: N/A;  . MULTIPLE EXTRACTIONS WITH ALVEOLOPLASTY N/A 07/29/2014   Procedure: Extraction of tooth #'s 1,15,16,17,18 with alveoloplasty and gross debridement of teeth;  Surgeon: Lenn Cal, DDS;  Location: [place];  Service: Oral Surgery;  Laterality: N/A;  . NECK SURGERY      Family History  Problem Relation Age of Onset  . Hypertension Maternal Grandmother     Social History   Socioeconomic History  . Marital status: Single    Spouse name: Not on file  . Number of children: Not on file  . Years of education: Not on file  . Highest education level: Not on file  Occupational History  . Not on file  Social Needs  . Financial resource strain: Not on file  . Food insecurity    Worry: Not on file    Inability: Not on file  . Transportation needs    Medical: Not on file    Non-medical: Not on file  Tobacco Use  . Smoking status: Current Some Day Smoker  Types: Cigarettes  . Smokeless tobacco: Never Used  Substance and Sexual Activity  . Alcohol use: Yes    Comment: socially  . Drug use: No  . Sexual activity: Not on file  Lifestyle  . Physical activity    Days per week: Not on file    Minutes per session: Not on file  . Stress: Not on file  Relationships  . Social Herbalist on phone: Not on file    Gets together: Not on file    Attends religious service: Not on file    Active  member of club or organization: Not on file    Attends meetings of clubs or organizations: Not on file    Relationship status: Not on file  . Intimate partner violence    Fear of current or ex partner: Not on file    Emotionally abused: Not on file    Physically abused: Not on file    Forced sexual activity: Not on file  Other Topics Concern  . Not on file  Social History Narrative  . Not on file    ROS Review of Systems  Constitutional: Positive for fatigue. Negative for chills and fever.  HENT: Negative for sore throat and voice change.   Eyes: Negative for photophobia and visual disturbance.  Respiratory: Negative for cough and shortness of breath.   Cardiovascular: Negative for chest pain, palpitations and leg swelling.  Gastrointestinal: Positive for diarrhea. Negative for abdominal pain, blood in stool, constipation and nausea.  Endocrine: Negative for cold intolerance, heat intolerance, polydipsia, polyphagia and polyuria.  Genitourinary: Negative for dysuria and frequency.  Musculoskeletal: Negative for arthralgias and back pain.  Neurological: Positive for dizziness and light-headedness. Negative for headaches.  Psychiatric/Behavioral: Negative for self-injury and suicidal ideas. The patient is nervous/anxious.     Objective:   Today's Vitals: BP 115/69 (BP Location: Right Arm, Patient Position: Sitting, Cuff Size: Normal)   Pulse 85   Temp 98.5 F (36.9 C) (Oral)   Wt 145 lb (65.8 kg)   BMI 22.05 kg/m   Physical Exam Vitals signs and nursing note reviewed.  Constitutional:      Appearance: Normal appearance.     Comments: Young adult male who appears younger than stated age.  Patient is wearing several layers of clothing therefore difficult to make assessment of weight/nutritional status  Neck:     Musculoskeletal: Normal range of motion and neck supple. No neck rigidity or muscular tenderness.  Cardiovascular:     Rate and Rhythm: Normal rate and regular  rhythm.  Pulmonary:     Effort: Pulmonary effort is normal.     Breath sounds: Normal breath sounds.  Abdominal:     Palpations: Abdomen is soft.     Tenderness: There is no abdominal tenderness. There is no right CVA tenderness, left CVA tenderness, guarding or rebound.     Comments: Patient denied any discomfort with abdominal exam but patient did have hyperactive bowel sounds in all quadrants  Musculoskeletal:        General: No swelling or deformity.     Right lower leg: No edema.     Left lower leg: No edema.     Comments: No CVA tenderness  Lymphadenopathy:     Cervical: No cervical adenopathy.  Skin:    General: Skin is warm and dry.  Neurological:     General: No focal deficit present.     Mental Status: He is alert and oriented to person, place, and  time.  Psychiatric:     Comments: Patient with stuttering and rapid speech     Assessment & Plan:  - Need for immunization against influenza Patient received influenza immunization at today's visit along with handout regarding immunization - Flu Vaccine QUAD 6+ mos PF IM (Fluarix Quad PF)  - Need for Tdap vaccination Tetanus immunization provided along with educational handout - Tdap vaccine greater than or equal to 7yo IM  1. Hospital discharge follow-up Patient's hospital records were reviewed along with labs and imaging.  Patient was found to have altered mental status with lethargy and vomiting by family members and was admitted to the hospital and found to have community-acquired pneumonia for which he received treatment as well as evaluation for chronic diarrhea/malnutrition and patient was found to have presence of esophagitis along with gastric and pyloric ulcers as well as colonoscopy and CT findings suggestive of inflammatory bowel disease/Crohn's disease.  He reports that he has not yet seen gastroenterology in follow-up and on review of medications he is not on PPI therapy for his esophagitis and gastric/pyloric  ulcers.  Patient also with bipolar disorder for which he reports longstanding follow-up with psychiatry.  2. Crohn's disease of colon with other complication ([place]) 3. Chronic diarrhea Patient with abnormal evaluation in the hospital with findings suggestive of Crohn's disease.  He continues to have chronic diarrhea and poor nutritional status.  Will check comprehensive metabolic panel for electrolyte and nutrition status and patient is being referred back to gastroenterology for follow-up and treatment.  Discussed remaining well-hydrated and going to the ED if continued dizziness/lightheadedness suggestive of dehydration or for any other concerns. - Ambulatory referral to Gastroenterology - Comprehensive metabolic panel  4. Protein-calorie malnutrition, severe ([place]) Patient was CMP done during hospitalization on 01/17/2019 with total protein of 4.6, albumin of 1.7 consistent with protein calorie malnutrition.  Patient unfortunately also continues to have chronic diarrhea as a contributing factor.  Will place social work consult to see if patient can receive nutritional supplements through his insurance are other sources and patient additionally is being referred back to gastroenterology for further evaluation and treatment.  Comprehensive metabolic panel will be repeated at today's visit to check for electrolyte abnormality as he also had low potassium during his hospitalization and in follow-up of his nutritional status. - Ambulatory referral to Gastroenterology - Comprehensive metabolic panel  5. Microcytic anemia Patient with CBC during hospitalization on 01/23/2019 with hemoglobin of 10.7 and normal MCV at 81.  Will repeat CBC to look for any further drop in hemoglobin suggestive of blood loss that might require more immediate medical attention. - CBC with Differential  6. Esophagitis Patient with recent abnormal EGD on 01/21/2019 with esophagitis along with gastric and pyloric ulcers.  He has  been referred to gastroenterology for further evaluation as he states that he was not aware of an appointment status post discharge.  Will check CBC for anemia and patient will be placed on pantoprazole 40 mg twice daily in the interim - Ambulatory referral to Gastroenterology - CBC with Differential - pantoprazole (PROTONIX) 40 MG tablet; Take 1 tablet (40 mg total) by mouth 2 (two) times daily.  Dispense: 60 tablet; Refill: 3  7. Acute gastric ulcer, unspecified whether gastric ulcer hemorrhage or perforation present He will have CBC to look for any worsening of anemia or increased white blood cell count which could be related to inflammation/infection.  Patient was not discharged on any PPI therapy at time of hospital discharge despite abnormal EGD  with esophagitis and evidence of ulcers.  Prescription sent to pharmacy for pantoprazole 40 mg twice daily.  Referral is being placed for patient to follow-up with GI as well. - CBC with Differential  8.  Need for follow-up by social work; 9.  Bipolar disorder Patient reports that he lives with his mother.  Patient has known chronic mental health issues on review of chart and has follow-up with mental health provider.  I am concerned however the patient states that he came to today's visit by himself by public transportation/bus.  Patient also has not had follow-up with gastroenterology since his hospitalization.  He also would likely benefit from resources to help with nutritional supplementation as well as to make sure that someone is administering medications/making sure of medication compliance as well as compliance with follow-up with medical appointments.  Will place for social work referral.  Outpatient Encounter Medications as of 02/23/2019  Medication Sig  . ARIPiprazole (ABILIFY) 10 MG tablet Take 1 tablet (10 mg total) by mouth at bedtime.  . benztropine (COGENTIN) 1 MG tablet Take 1 tablet (1 mg total) by mouth daily.  . citalopram (CELEXA)  20 MG tablet Take 1 tablet (20 mg total) by mouth at bedtime.  . traZODone (DESYREL) 100 MG tablet Take 2 tablets (200 mg total) by mouth at bedtime.   No facility-administered encounter medications on file as of 02/23/2019.    An After Visit Summary was printed and given to the patient.  Follow-up: .Return in about 4 weeks (around 03/23/2019) for stomach issues-sooner if needed.  Antony Blackbird, MD

## 2019-02-23 NOTE — Patient Instructions (Signed)
Crohn's Disease  Crohn's disease is a long-lasting (chronic) disease that affects the gastrointestinal (GI) tract. Crohn's disease often causes irritation and inflammation in the small intestine and the beginning of the large intestine, but it can affect any part of the GI tract. Crohn's disease is part of a group of illnesses that are known as inflammatory bowel disease (IBD). Crohn's disease may start slowly and get worse over time. Symptoms may come and go. They may also go away for months or even years at a time (remission). What are the causes? The exact cause of this condition is not known. It may involve a response that causes your body's disease-fighting (immune) system to attack healthy cells and tissues (autoimmune response). Bacteria, genes, and your environment may also play a role. What increases the risk? The following factors may make you more likely to develop this condition:  Having a family member who has Crohn's disease, another IBD, or an autoimmune condition.  Using products that contain nicotine or tobacco, such as cigarettes and e-cigarettes.  Being in your 14s.  Having [place] European ancestry. What are the signs or symptoms? The main symptoms of this condition involve your GI tract. These include:  Diarrhea.  Pain or cramping in the abdomen. This is commonly felt in the lower right side of the abdomen.  Frequent watery or bloody stools.  Constipation. This may mean having: ? Fewer bowel movements in a week than normal. ? Difficulty having a bowel movement. ? Stools that are dry, hard, or larger than normal.  Rectal bleeding.  Rectal pain.  An urgent need to have a bowel movement.  The feeling that you are not finished having a bowel movement. Other symptoms may include:  Unexplained weight loss.  Fatigue.  Fever.  Nausea.  Loss of appetite.  Joint pain.  Vision changes.  Red bumps or sores on the skin.  Sores inside the mouth. How is  this diagnosed? This condition may be diagnosed based on:  Your symptoms and your medical history.  A physical exam.  Tests, which may include: ? Blood tests. ? Stool sample tests. ? Imaging tests, such as X-rays and CT scans. ? Tests to examine the inside of your intestines using a long, flexible tube that has a light and a camera on the end (endoscopy or colonoscopy). ? A procedure to remove tissue samples from inside your bowel for testing (biopsy). You may need to work with a health care provider who specializes in diseases of the digestive tract (gastroenterologist). How is this treated? There is no cure for this condition, but treatment can help you manage your symptoms. Crohn's disease affects each person differently. Your treatment may include:  Resting your bowels. This involves having a period of healing time when your bowels are not passing stools. This may be done by: ? Drinking only clear liquids. These are liquids that you can see through, such as water, black coffee, fruit juice without pulp, broth, gelatin, and ice pops. ? Getting nutrition through an IV for a period of time.  Medicines. These may be used by themselves or with other treatments (combination therapy). These may include antibiotic medicines. You may be given medicines that help to: ? Reduce inflammation. ? Control your immune system activity. ? Fight infections. ? Relieve cramps and prevent diarrhea. ? Control your pain.  Surgery. You may need surgery if: ? Medicines and other treatments are not working anymore. ? You develop complications from severe Crohn's disease. ? A section of your  intestine becomes so damaged that it needs to be removed. Follow these instructions at home: Medicines  Take over-the-counter and prescription medicines only as told by your health care provider.  If you were prescribed an antibiotic, take it as told by your health care provider. Do not stop taking the antibiotic  even if you start to feel better.  Avoid taking ibuprofen or other NSAID medicines if possible, these can make Crohn's disease worse. Eating and drinking  Talk with your health care provider or a diet and nutrition specialist (registered dietitian) about what diet is best for you.  Drink enough fluid to keep your urine pale yellow.  If you are taking steroids to reduce inflammation, get plenty of calcium in your diet to help keep your bones healthy. You may also consider taking a calcium supplement with vitamin D.  Keep a food diary to identify foods that make your symptoms better or worse.  Avoid foods that cause symptoms.  Follow instructions from your health care provider about eating or drinking restrictions if you have worsening symptoms (flare-up).  Limit alcohol intake to no more than 1 drink a day for nonpregnant women and 2 drinks a day for men. One drink equals 12 oz of beer, 5 oz of wine, or 1 oz of hard liquor. General instructions  Make sure you get all the vaccines that your health care provider recommends, especially pneumonia (pneumococcal) and flu (influenza) vaccines.  Do not use any products that contain nicotine or tobacco, such as cigarettes and e-cigarettes. If you need help quitting, ask your health care provider.  Exercise every day, or as often told by your health care provider.  Keep all follow-up visits as told by your health care provider. This is important. Contact a health care provider if:  You have diarrhea, cramps in your abdomen, and other GI problems that are present almost all the time.  Your symptoms do not improve with treatment.  You continue to lose weight.  You develop a rash or sores on your skin.  You develop eye problems.  You have a fever.  Your symptoms get worse.  You develop new symptoms. Get help right away if:  You have bloody diarrhea.  You have severe pain in your abdomen.  You cannot pass  stools. Summary  Crohn's disease affects each person differently. There are multiple treatment options that can help you manage the condition.  Talk with your health care provider or diet and nutrition specialist (registered dietitian) about what diet is best for you.  Make sure you get all the vaccines that your health care provider recommends, especially pneumonia (pneumococcal) and flu (influenza) vaccines. This information is not intended to replace advice given to you by your health care provider. Make sure you discuss any questions you have with your health care provider. Document Released: 01/27/2005 Document Revised: 04/01/2017 Document Reviewed: 12/20/2016 Elsevier Patient Education  2020 [employer].

## 2019-02-23 NOTE — Progress Notes (Signed)
Hospital follow

## 2019-02-24 ENCOUNTER — Emergency Department (HOSPITAL_COMMUNITY): Payer: Medicaid Other

## 2019-02-24 ENCOUNTER — Emergency Department (HOSPITAL_COMMUNITY)
Admission: EM | Admit: 2019-02-24 | Discharge: 2019-02-24 | Disposition: A | Discharge status: Home / Self Care | Payer: Medicaid Other | Attending: Emergency Medicine | Admitting: Emergency Medicine

## 2019-02-24 ENCOUNTER — Other Ambulatory Visit: Payer: Self-pay

## 2019-02-24 ENCOUNTER — Encounter (HOSPITAL_COMMUNITY): Payer: Self-pay

## 2019-02-24 DIAGNOSIS — F17210 Nicotine dependence, cigarettes, uncomplicated: Secondary | ICD-10-CM | POA: Diagnosis not present

## 2019-02-24 DIAGNOSIS — E109 Type 1 diabetes mellitus without complications: Secondary | ICD-10-CM | POA: Diagnosis not present

## 2019-02-24 DIAGNOSIS — E876 Hypokalemia: Secondary | ICD-10-CM | POA: Diagnosis not present

## 2019-02-24 DIAGNOSIS — K029 Dental caries, unspecified: Secondary | ICD-10-CM | POA: Insufficient documentation

## 2019-02-24 DIAGNOSIS — R197 Diarrhea, unspecified: Secondary | ICD-10-CM | POA: Insufficient documentation

## 2019-02-24 DIAGNOSIS — Z79899 Other long term (current) drug therapy: Secondary | ICD-10-CM | POA: Diagnosis not present

## 2019-02-24 LAB — CBC WITH DIFFERENTIAL/PLATELET
Abs Immature Granulocytes: 0.04 10*3/uL (ref 0.00–0.07)
Basophils Absolute: 0 x10E3/uL (ref 0.0–0.2)
Basophils Absolute: 0 10*3/uL (ref 0.0–0.1)
Basophils Relative: 0 %
Basos: 0 %
EOS (ABSOLUTE): 0.1 x10E3/uL (ref 0.0–0.4)
Eos: 1 %
Eosinophils Absolute: 0.1 10*3/uL (ref 0.0–0.5)
Eosinophils Relative: 1 %
HCT: 39.5 % (ref 39.0–52.0)
Hematocrit: 40.9 % (ref 37.5–51.0)
Hemoglobin: 13.1 g/dL (ref 13.0–17.7)
Hemoglobin: 12.4 g/dL — ABNORMAL LOW (ref 13.0–17.0)
Immature Grans (Abs): 0.1 x10E3/uL (ref 0.0–0.1)
Immature Granulocytes: 1 %
Immature Granulocytes: 0 %
Lymphocytes Absolute: 0.7 x10E3/uL (ref 0.7–3.1)
Lymphocytes Relative: 5 %
Lymphs Abs: 0.6 10*3/uL — ABNORMAL LOW (ref 0.7–4.0)
Lymphs: 6 %
MCH: 26.3 pg — ABNORMAL LOW (ref 26.6–33.0)
MCH: 26.4 pg (ref 26.0–34.0)
MCHC: 32 g/dL (ref 31.5–35.7)
MCHC: 31.4 g/dL (ref 30.0–36.0)
MCV: 82 fL (ref 79–97)
MCV: 84.2 fL (ref 80.0–100.0)
Monocytes Absolute: 1 x10E3/uL — ABNORMAL HIGH (ref 0.1–0.9)
Monocytes Absolute: 0.8 10*3/uL (ref 0.1–1.0)
Monocytes Relative: 7 %
Monocytes: 9 %
Neutro Abs: 9.1 10*3/uL — ABNORMAL HIGH (ref 1.7–7.7)
Neutrophils Absolute: 9.6 x10E3/uL — ABNORMAL HIGH (ref 1.4–7.0)
Neutrophils Relative %: 87 %
Neutrophils: 83 %
Platelets: 307 x10E3/uL (ref 150–450)
Platelets: 263 10*3/uL (ref 150–400)
RBC: 4.98 x10E6/uL (ref 4.14–5.80)
RBC: 4.69 MIL/uL (ref 4.22–5.81)
RDW: 15.1 % (ref 11.6–15.4)
RDW: 15.8 % — ABNORMAL HIGH (ref 11.5–15.5)
WBC: 11.5 x10E3/uL — ABNORMAL HIGH (ref 3.4–10.8)
WBC: 10.6 10*3/uL — ABNORMAL HIGH (ref 4.0–10.5)
nRBC: 0 % (ref 0.0–0.2)

## 2019-02-24 LAB — COMPREHENSIVE METABOLIC PANEL
ALT: 44 U/L (ref 0–44)
AST: 25 U/L (ref 15–41)
Albumin: 3.3 g/dL — ABNORMAL LOW (ref 3.5–5.0)
Alkaline Phosphatase: 77 U/L (ref 38–126)
Anion gap: 7 (ref 5–15)
BUN: <5 mg/dL — ABNORMAL LOW (ref 6–20)
CO2: 23 mmol/L (ref 22–32)
Calcium: 8.4 mg/dL — ABNORMAL LOW (ref 8.9–10.3)
Chloride: 106 mmol/L (ref 98–111)
Creatinine, Ser: 0.71 mg/dL (ref 0.61–1.24)
GFR calc Af Amer: >60 mL/min (ref >60)
GFR calc non Af Amer: >60 mL/min (ref >60)
Glucose, Bld: 83 mg/dL (ref 70–99)
Potassium: 3.3 mmol/L — ABNORMAL LOW (ref 3.5–5.1)
Sodium: 136 mmol/L (ref 135–145)
Total Bilirubin: 1.2 mg/dL (ref 0.3–1.2)
Total Protein: 6.2 g/dL — ABNORMAL LOW (ref 6.5–8.1)

## 2019-02-24 LAB — COMPREHENSIVE METABOLIC PANEL WITH GFR
ALT: 60 IU/L — ABNORMAL HIGH (ref 0–44)
AST: 37 IU/L (ref 0–40)
Albumin/Globulin Ratio: 1.6 (ref 1.2–2.2)
Albumin: 3.7 g/dL — ABNORMAL LOW (ref 4.0–5.0)
Alkaline Phosphatase: 102 IU/L (ref 39–117)
BUN/Creatinine Ratio: 6 — ABNORMAL LOW (ref 9–20)
BUN: 5 mg/dL — ABNORMAL LOW (ref 6–20)
Bilirubin Total: 0.4 mg/dL (ref 0.0–1.2)
CO2: 20 mmol/L (ref 20–29)
Calcium: 8.9 mg/dL (ref 8.7–10.2)
Chloride: 104 mmol/L (ref 96–106)
Creatinine, Ser: 0.81 mg/dL (ref 0.76–1.27)
GFR calc Af Amer: 132 mL/min/1.73
GFR calc non Af Amer: 114 mL/min/1.73
Globulin, Total: 2.3 g/dL (ref 1.5–4.5)
Glucose: 124 mg/dL — ABNORMAL HIGH (ref 65–99)
Potassium: 3.4 mmol/L — ABNORMAL LOW (ref 3.5–5.2)
Sodium: 138 mmol/L (ref 134–144)
Total Protein: 6 g/dL (ref 6.0–8.5)

## 2019-02-24 LAB — C DIFFICILE QUICK SCREEN W PCR REFLEX
C Diff antigen: NEGATIVE
C Diff interpretation: NOT DETECTED
C Diff toxin: NEGATIVE

## 2019-02-24 MED ORDER — LOPERAMIDE HCL 2 MG PO CAPS: 2.0000 mg | ORAL_CAPSULE | Freq: Four times a day (QID) | ORAL | 0 refills | Status: DC | PRN

## 2019-02-24 MED ORDER — ACETAMINOPHEN 325 MG PO TABS
650.0000 mg | ORAL_TABLET | Freq: Once | ORAL | Status: AC
  Administered 2019-02-24: 650 mg via ORAL
  Filled 2019-02-24: qty 2

## 2019-02-24 MED ORDER — POTASSIUM CHLORIDE CRYS ER 20 MEQ PO TBCR: 20.0000 meq | EXTENDED_RELEASE_TABLET | Freq: Every day | ORAL | 0 refills | Status: DC

## 2019-02-24 MED ORDER — LACTATED RINGERS IV BOLUS
1000.0000 mL | Freq: Once | INTRAVENOUS | Status: AC
  Administered 2019-02-24: 1000 mL via INTRAVENOUS

## 2019-02-24 MED ORDER — POTASSIUM CHLORIDE CRYS ER 20 MEQ PO TBCR
40.0000 meq | EXTENDED_RELEASE_TABLET | Freq: Once | ORAL | Status: AC
  Administered 2019-02-24: 40 meq via ORAL
  Filled 2019-02-24: qty 2

## 2019-02-24 NOTE — Discharge Instructions (Signed)
Take Ibuprofen and/or Tylenol for your dental pain. Follow up with a  dentist as soon as possible next week.

## 2019-02-24 NOTE — ED Triage Notes (Signed)
Patient arrived via [facility]. Patient is from home. Patient is AOx4 and ambulatory. Patient chief complaint is Toothache on right side with diarrhea. Both complaints have been going on for 10 days.   500cc Normal saline provided

## 2019-02-24 NOTE — ED Notes (Addendum)
Pt able to ambulate to/from the restroom with standby assist. No issues observed during ambulation.

## 2019-02-24 NOTE — ED Notes (Signed)
Urine provided at bedside. Patient ambulated to restroom without complication or assistance.

## 2019-02-24 NOTE — ED Provider Notes (Signed)
[facility] DEPT Provider Note   CSN: [id number] Arrival date & time: 02/24/19  1756     History   Chief Complaint Chief Complaint  Patient presents with  . Dental Pain  . Diarrhea    HPI Carl Carey is a 38 y.o. male.     HPI  38 year old male presents with diarrhea and dental pain.  The patient is a poor historian.  He endorses that he has had diarrhea since he left the hospital back in September.  Has had dental pain for months but it seems to be worse over the last 10 days.  Has a hard time swallowing because of the pain.  Pain is mostly right maxillary but also some left maxillary.  No neck pain.  He also endorses 8 or so stools per day.  He states they are mucus which he describes as yellow.  No blood.  No abdominal pain or vomiting.  No fevers.  Has had a cough since he left the hospital.  Past Medical History:  Diagnosis Date  . Bipolar 1 disorder ([place])   . Diabetes ([place])   . Malnutrition ([place])   . Panic attack   . Parkinsonism [facility])     Patient Active Problem List   Diagnosis Date Noted  . Bipolar disorder, in partial remission, most recent episode manic ([place])   . Septic shock ([place]) 01/13/2019  . Bipolar 1 disorder ([place]) 11/09/2018  . Fever in adult 07/25/2014  . Protein-calorie malnutrition, severe ([place]) 07/23/2014  . Parkinsonian features 07/21/2014  . Dysphagia 07/21/2014  . Microcytic anemia 07/21/2014    Past Surgical History:  Procedure Laterality Date  . BIOPSY  01/21/2019   Procedure: BIOPSY;  Surgeon: Otis Brace, MD;  Location: [facility];  Service: Gastroenterology;;  . COLONOSCOPY WITH PROPOFOL N/A 01/21/2019   Procedure: COLONOSCOPY WITH PROPOFOL;  Surgeon: Otis Brace, MD;  Location: [facility];  Service: Gastroenterology;  Laterality: N/A;  . ESOPHAGOGASTRODUODENOSCOPY (EGD) WITH PROPOFOL N/A 01/21/2019   Procedure: ESOPHAGOGASTRODUODENOSCOPY (EGD) WITH PROPOFOL;  Surgeon: Otis Brace, MD;   [facility];  Service: Gastroenterology;  Laterality: N/A;  . MULTIPLE EXTRACTIONS WITH ALVEOLOPLASTY N/A 07/29/2014   Procedure: Extraction of tooth #'s 1,15,16,17,18 with alveoloplasty and gross debridement of teeth;  Surgeon: Lenn Cal, DDS;  Location: [facility];  Service: Oral Surgery;  Laterality: N/A;  . NECK SURGERY          Home Medications    Prior to Admission medications   Medication Sig Start Date End Date Taking? Authorizing Provider  ARIPiprazole (ABILIFY) 10 MG tablet Take 1 tablet (10 mg total) by mouth at bedtime. 01/25/19  Yes Shelly Coss, MD  haloperidol (HALDOL) 5 MG tablet Take 15 mg by mouth daily.   Yes [provider]  pantoprazole (PROTONIX) 40 MG tablet Take 1 tablet (40 mg total) by mouth 2 (two) times daily. 02/23/19  Yes Fulp, Cammie, MD  traZODone (DESYREL) 100 MG tablet Take 2 tablets (200 mg total) by mouth at bedtime. 01/25/19  Yes Shelly Coss, MD  benztropine (COGENTIN) 1 MG tablet Take 1 tablet (1 mg total) by mouth daily. Patient not taking: Reported on 02/24/2019 01/26/19   Shelly Coss, MD  citalopram (CELEXA) 20 MG tablet Take 1 tablet (20 mg total) by mouth at bedtime. Patient not taking: Reported on 02/24/2019 01/25/19   Shelly Coss, MD  loperamide (IMODIUM) 2 MG capsule Take 1 capsule (2 mg total) by mouth 4 (four) times daily as needed for diarrhea or  loose stools. Max 16 mg (8 capsules) per day 02/24/19   Sherwood Gambler, MD  potassium chloride SA (KLOR-CON) 20 MEQ tablet Take 1 tablet (20 mEq total) by mouth daily. 02/24/19   Sherwood Gambler, MD    Family History Family History  Problem Relation Age of Onset  . Hypertension Maternal Grandmother     Social History Social History   Tobacco Use  . Smoking status: Current Some Day Smoker    Types: Cigarettes  . Smokeless tobacco: Never Used  Substance Use Topics  . Alcohol use: Yes    Comment: socially  . Drug use: No     Allergies   Patient has  no known allergies.   Review of Systems Review of Systems  Constitutional: Negative for fever.  HENT: Positive for dental problem.   Respiratory: Positive for cough. Negative for shortness of breath.   Gastrointestinal: Positive for diarrhea. Negative for abdominal pain, blood in stool and vomiting.  All other systems reviewed and are negative.    Physical Exam Updated Vital Signs BP 120/74   Pulse 84   Temp 98.4 F (36.9 C) (Oral)   Resp (!) 24   Ht 5' 8"  (1.727 m)   Wt 65.8 kg   SpO2 99%   BMI 22.05 kg/m   Physical Exam Vitals signs and nursing note reviewed.  Constitutional:      General: He is not in acute distress.    Appearance: He is well-developed. He is not ill-appearing or diaphoretic.  HENT:     Head: Normocephalic and atraumatic.     Right Ear: External ear normal.     Left Ear: External ear normal.     Nose: Nose normal.     Mouth/Throat:      Comments: No gingival or dental abscess. No facial swelling. A couple broken teeth but no signs of overt infection Eyes:     General:        Right eye: No discharge.        Left eye: No discharge.  Neck:     Musculoskeletal: Neck supple.  Cardiovascular:     Rate and Rhythm: Normal rate and regular rhythm.     Heart sounds: Normal heart sounds.  Pulmonary:     Effort: Pulmonary effort is normal.     Breath sounds: Normal breath sounds.  Abdominal:     Palpations: Abdomen is soft.     Tenderness: There is no abdominal tenderness.  Skin:    General: Skin is warm and dry.  Neurological:     Mental Status: He is alert.     Comments: Stuttering speech  Psychiatric:        Mood and Affect: Mood is not anxious.      ED Treatments / Results  Labs (all labs ordered are listed, but only abnormal results are displayed) Labs Reviewed  CBC WITH DIFFERENTIAL/PLATELET - Abnormal; Notable for the following components:      Result Value   WBC 10.6 (*)    Hemoglobin 12.4 (*)    RDW 15.8 (*)    Neutro Abs 9.1  (*)    Lymphs Abs 0.6 (*)    All other components within normal limits  COMPREHENSIVE METABOLIC PANEL - Abnormal; Notable for the following components:   Potassium 3.3 (*)    BUN <5 (*)    Calcium 8.4 (*)    Total Protein 6.2 (*)    Albumin 3.3 (*)    All other components within normal limits  GI PATHOGEN PANEL BY PCR, STOOL  C DIFFICILE QUICK SCREEN W PCR REFLEX    EKG None  Radiology Dg Chest Portable 1 View  Result Date: 02/24/2019 CLINICAL DATA:  Cough, recent pneumonia EXAM: PORTABLE CHEST 1 VIEW COMPARISON:  01/15/2019 FINDINGS: Significantly improved heterogeneous airspace opacity of the right upper lobe, with some persistent opacity of the right lung base. The heart and mediastinum are normal. The osseous structures are unremarkable. IMPRESSION: Significantly improved heterogeneous airspace opacity of the right upper lobe, with some persistent opacity of the right lung base. Findings are consistent with improved infection. Electronically Signed   By: Eddie Candle M.D.   On: 02/24/2019 19:29    Procedures Procedures (including critical care time)  Medications Ordered in ED Medications  potassium chloride SA (KLOR-CON) CR tablet 40 mEq (has no administration in time range)  lactated ringers bolus 1,000 mL (1,000 mLs Intravenous New Bag/Given 02/24/19 1855)  acetaminophen (TYLENOL) tablet 650 mg (650 mg Oral Given 02/24/19 1855)     Initial Impression / Assessment and Plan / ED Course  I have reviewed the triage vital signs and the nursing notes.  Pertinent labs & imaging results that were available during my care of the patient were reviewed by me and considered in my medical decision making (see chart for details).        Patient is in no acute distress.  Mild tachycardia but this improved with fluids.  He is mildly hypokalemic which will be repleted orally here and on discharge.  We will try to treat his diarrhea with Imodium.  He was diagnosed with Crohn's  disease but no blood in his stools or abdominal discomfort.  GI pathogen panel sent.  We will also send for C. difficile.  If these are positive he will need antibiotics though typically these do not come back in a reasonable time in the ED.  Otherwise, as for his dental pain this seems to be a subacute to chronic issue.  I see no facial swelling or overt infection that would warrant antibiotics.  I am also worried that antibiotics could make his diarrhea worse.  Tylenol and ibuprofen for pain but he otherwise appears stable for discharge home.  Final Clinical Impressions(s) / ED Diagnoses   Final diagnoses:  Diarrhea, unspecified type  Pain due to dental caries  Hypokalemia    ED Discharge Orders         Ordered    potassium chloride SA (KLOR-CON) 20 MEQ tablet  Daily     02/24/19 2032    loperamide (IMODIUM) 2 MG capsule  4 times daily PRN     02/24/19 2032           Sherwood Gambler, MD 02/24/19 2047

## 2019-02-26 ENCOUNTER — Other Ambulatory Visit: Payer: Self-pay

## 2019-02-26 ENCOUNTER — Emergency Department (HOSPITAL_COMMUNITY)
Admission: EM | Admit: 2019-02-26 | Discharge: 2019-02-27 | Disposition: A | Discharge status: Home / Self Care | Payer: Medicaid Other | Attending: Emergency Medicine | Admitting: Emergency Medicine

## 2019-02-26 ENCOUNTER — Encounter (HOSPITAL_COMMUNITY): Payer: Self-pay | Admitting: Emergency Medicine

## 2019-02-26 DIAGNOSIS — F99 Mental disorder, not otherwise specified: Secondary | ICD-10-CM | POA: Diagnosis not present

## 2019-02-26 DIAGNOSIS — F17210 Nicotine dependence, cigarettes, uncomplicated: Secondary | ICD-10-CM | POA: Diagnosis not present

## 2019-02-26 DIAGNOSIS — R000 Tachycardia, unspecified: Secondary | ICD-10-CM | POA: Diagnosis present

## 2019-02-26 DIAGNOSIS — G20 Parkinson's disease: Secondary | ICD-10-CM | POA: Insufficient documentation

## 2019-02-26 DIAGNOSIS — E119 Type 2 diabetes mellitus without complications: Secondary | ICD-10-CM | POA: Insufficient documentation

## 2019-02-26 DIAGNOSIS — R251 Tremor, unspecified: Secondary | ICD-10-CM | POA: Diagnosis not present

## 2019-02-26 LAB — BASIC METABOLIC PANEL
Anion gap: 13 (ref 5–15)
BUN: <5 mg/dL — ABNORMAL LOW (ref 6–20)
CO2: 18 mmol/L — ABNORMAL LOW (ref 22–32)
Calcium: 8.7 mg/dL — ABNORMAL LOW (ref 8.9–10.3)
Chloride: 105 mmol/L (ref 98–111)
Creatinine, Ser: 0.92 mg/dL (ref 0.61–1.24)
GFR calc Af Amer: >60 mL/min (ref >60)
GFR calc non Af Amer: >60 mL/min (ref >60)
Glucose, Bld: 147 mg/dL — ABNORMAL HIGH (ref 70–99)
Potassium: 3.4 mmol/L — ABNORMAL LOW (ref 3.5–5.1)
Sodium: 136 mmol/L (ref 135–145)

## 2019-02-26 LAB — CBC
HCT: 44.7 % (ref 39.0–52.0)
Hemoglobin: 14 g/dL (ref 13.0–17.0)
MCH: 26.7 pg (ref 26.0–34.0)
MCHC: 31.3 g/dL (ref 30.0–36.0)
MCV: 85.3 fL (ref 80.0–100.0)
Platelets: 303 10*3/uL (ref 150–400)
RBC: 5.24 MIL/uL (ref 4.22–5.81)
RDW: 15.3 % (ref 11.5–15.5)
WBC: 10.9 10*3/uL — ABNORMAL HIGH (ref 4.0–10.5)
nRBC: 0 % (ref 0.0–0.2)

## 2019-02-26 MED ORDER — SODIUM CHLORIDE 0.9% FLUSH: 3.0000 mL | Freq: Once | INTRAVENOUS | Status: DC

## 2019-02-26 NOTE — ED Triage Notes (Signed)
BIB EMS from home. Pt called out reporting tremors/fast HR after smoking 3 cigarettes in a row. HR 120-140's en route. Significant psych hx, parkinsonian features. A/OX4.

## 2019-02-27 ENCOUNTER — Telehealth: Payer: Self-pay | Admitting: General Practice

## 2019-02-27 ENCOUNTER — Encounter: Payer: Self-pay | Admitting: Family Medicine

## 2019-02-27 MED ORDER — DIPHENHYDRAMINE HCL 25 MG PO CAPS
50.0000 mg | ORAL_CAPSULE | Freq: Once | ORAL | Status: AC
  Administered 2019-02-27: 50 mg via ORAL
  Filled 2019-02-27: qty 2

## 2019-02-27 NOTE — Telephone Encounter (Signed)
Thanks, he has some severe medical and mental health issues

## 2019-02-27 NOTE — Discharge Instructions (Addendum)
You were evaluated in the Emergency Department and after careful evaluation, we did not find any emergent condition requiring admission or further testing in the hospital.  Your exam/testing today is overall reassuring.  Your symptoms may be related to your medications.  Please follow-up with your regular prescriber to discuss dosing.  Please return to the Emergency Department if you experience any worsening of your condition.  We encourage you to follow up with a primary care provider.  Thank you for allowing [place] to be a part of your care.

## 2019-02-27 NOTE — Telephone Encounter (Signed)
[facility]  called to inform you that the patient was in the hospital twice this weekend after you saw him Friday

## 2019-02-27 NOTE — ED Provider Notes (Signed)
[facility] Emergency Department Provider Note MRN:  [id number]  Arrival date & time: 02/27/19     Chief Complaint   Tachycardia   History of Present Illness   Carl Carey is a 38 y.o. year-old male with a history of bipolar disorder, diabetes, panic attacks presenting to the ED with chief complaint of tachycardia.  Reportedly patient smoked 3 cigarettes very quickly and then became tremulous and tachycardic and called EMS.  Patient subsequently waited in the waiting room for 19 hours and is here for evaluation.  Tachycardia and tremulousness is improved.  Patient denies fever, no cough, no chest pain, no shortness of breath, no abdominal pain, no dysuria.  Patient is a challenging historian due to psychiatric illness and speech impediment.  Explains that he was in the emergency department a few days ago but his dental pain is improved and his diarrhea is resolved.  Review of Systems  A complete 10 system review of systems was obtained and all systems are negative except as noted in the HPI and PMH.   Patient's Health History    Past Medical History:  Diagnosis Date  . Bipolar 1 disorder ([place])   . Diabetes ([place])   . Malnutrition ([place])   . Panic attack   . Parkinsonism [facility])     Past Surgical History:  Procedure Laterality Date  . BIOPSY  01/21/2019   Procedure: BIOPSY;  Surgeon: Otis Brace, MD;  Location: [facility];  Service: Gastroenterology;;  . COLONOSCOPY WITH PROPOFOL N/A 01/21/2019   Procedure: COLONOSCOPY WITH PROPOFOL;  Surgeon: Otis Brace, MD;  Location: [facility];  Service: Gastroenterology;  Laterality: N/A;  . ESOPHAGOGASTRODUODENOSCOPY (EGD) WITH PROPOFOL N/A 01/21/2019   Procedure: ESOPHAGOGASTRODUODENOSCOPY (EGD) WITH PROPOFOL;  Surgeon: Otis Brace, MD;  [facility];  Service: Gastroenterology;  Laterality: N/A;  . MULTIPLE EXTRACTIONS WITH ALVEOLOPLASTY N/A 07/29/2014   Procedure: Extraction of tooth #'s  1,15,16,17,18 with alveoloplasty and gross debridement of teeth;  Surgeon: Lenn Cal, DDS;  Location: [facility];  Service: Oral Surgery;  Laterality: N/A;  . NECK SURGERY      Family History  Problem Relation Age of Onset  . Hypertension Maternal Grandmother     Social History   Socioeconomic History  . Marital status: Single    Spouse name: Not on file  . Number of children: Not on file  . Years of education: Not on file  . Highest education level: Not on file  Occupational History  . Not on file  Social Needs  . Financial resource strain: Not on file  . Food insecurity    Worry: Not on file    Inability: Not on file  . Transportation needs    Medical: Not on file    Non-medical: Not on file  Tobacco Use  . Smoking status: Current Some Day Smoker    Types: Cigarettes  . Smokeless tobacco: Never Used  Substance and Sexual Activity  . Alcohol use: Yes    Comment: socially  . Drug use: No  . Sexual activity: Not Currently  Lifestyle  . Physical activity    Days per week: Not on file    Minutes per session: Not on file  . Stress: Not on file  Relationships  . Social Herbalist on phone: Not on file    Gets together: Not on file    Attends religious service: Not on file    Active member of club or organization: Not on file  Attends meetings of clubs or organizations: Not on file    Relationship status: Not on file  . Intimate partner violence    Fear of current or ex partner: Not on file    Emotionally abused: Not on file    Physically abused: Not on file    Forced sexual activity: Not on file  Other Topics Concern  . Not on file  Social History Narrative  . Not on file     Physical Exam  Vital Signs and Nursing Notes reviewed Vitals:   02/27/19 0805 02/27/19 0831  BP: 136/90 137/83  Pulse: (!) 105 (!) 102  Resp: (!) 23 (!) 32  Temp:    SpO2: 100% 97%    CONSTITUTIONAL: Chronically ill-appearing, NAD NEURO:  Alert and oriented x 3, no  focal deficits EYES:  eyes equal and reactive ENT/NECK:  no LAD, no JVD CARDIO: Regular rate, well-perfused, normal S1 and S2 PULM:  CTAB no wheezing or rhonchi GI/GU:  normal bowel sounds, non-distended, non-tender MSK/SPINE:  No gross deformities, no edema SKIN:  no rash, atraumatic PSYCH:  Appropriate speech and behavior  Diagnostic and Interventional Summary    EKG Interpretation  Date/Time:  Monday February 26 2019 16:58:31 EDT Ventricular Rate:  136 PR Interval:  146 QRS Duration: 74 QT Interval:  288 QTC Calculation: 433 R Axis:   77 Text Interpretation: Sinus tachycardia Right atrial enlargement Borderline ECG Confirmed by Gerlene Fee [id number]) on 02/27/2019 7:55:37 AM      Labs Reviewed  BASIC METABOLIC PANEL - Abnormal; Notable for the following components:      Result Value   Potassium 3.4 (*)    CO2 18 (*)    Glucose, Bld 147 (*)    BUN <5 (*)    Calcium 8.7 (*)    All other components within normal limits  CBC - Abnormal; Notable for the following components:   WBC 10.9 (*)    All other components within normal limits  URINALYSIS, ROUTINE W REFLEX MICROSCOPIC    No orders to display    Medications  sodium chloride flush (NS) 0.9 % injection 3 mL (has no administration in time range)  diphenhydrAMINE (BENADRYL) capsule 50 mg (50 mg Oral Given 02/27/19 1093)     Procedures  /  Critical Care Procedures  ED Course and Medical Decision Making  I have reviewed the triage vital signs and the nursing notes.  Pertinent labs & imaging results that were available during my care of the patient were reviewed by me and considered in my medical decision making (see below for details).  Tachycardia is now resolved, suspect related to panic/anxiety/nicotine.  No chest pain or shortness of breath to suggest PE or other acute phenomenon.  EKG is with initial sinus tachycardia but on my evaluation heart rate is in the 90s.  Patient explains that his mood is relatively  normal.  Medically I see no need for further testing.  Patient also appears cleared from a psychiatric perspective.  Patient endorsing some tightness to his neck musculature, possibly related to dystonic reaction, provided some Benadryl here in the emergency department.  Advise follow-up with his regular prescriber.      Barth Kirks. [place]onia Small, [place] [email or web address]  Final Clinical Impressions(s) / ED Diagnoses     ICD-10-CM   1. Tremulousness  R25.1   2. Psychiatric disorder  [facility]     ED Discharge Orders    None  Discharge Instructions Discussed with and Provided to Patient:   Discharge Instructions     You were evaluated in the Emergency Department and after careful evaluation, we did not find any emergent condition requiring admission or further testing in the hospital.  Your exam/testing today is overall reassuring.  Your symptoms may be related to your medications.  Please follow-up with your regular prescriber to discuss dosing.  Please return to the Emergency Department if you experience any worsening of your condition.  We encourage you to follow up with a primary care provider.  Thank you for allowing [place] to be a part of your care.      Maudie Flakes, MD 02/27/19 [phone]

## 2019-02-28 LAB — GI PATHOGEN PANEL BY PCR, STOOL

## 2019-03-01 ENCOUNTER — Other Ambulatory Visit: Payer: Self-pay

## 2019-03-01 ENCOUNTER — Emergency Department (HOSPITAL_COMMUNITY)
Admission: EM | Admit: 2019-03-01 | Discharge: 2019-03-02 | Disposition: A | Discharge status: Home / Self Care | Payer: Medicaid Other | Source: Home / Self Care

## 2019-03-01 ENCOUNTER — Encounter: Payer: Self-pay | Admitting: Nurse Practitioner

## 2019-03-01 ENCOUNTER — Encounter (HOSPITAL_COMMUNITY): Payer: Self-pay

## 2019-03-01 DIAGNOSIS — Z5321 Procedure and treatment not carried out due to patient leaving prior to being seen by health care provider: Secondary | ICD-10-CM | POA: Insufficient documentation

## 2019-03-01 DIAGNOSIS — K0889 Other specified disorders of teeth and supporting structures: Secondary | ICD-10-CM | POA: Insufficient documentation

## 2019-03-01 NOTE — ED Notes (Signed)
Pt called to peds room X1 no response

## 2019-03-01 NOTE — ED Triage Notes (Signed)
Pt reports left sided dental pain for 1 week. Drool noted around his mouth. Pt alert, nad noted. VSS

## 2019-03-02 ENCOUNTER — Emergency Department (HOSPITAL_COMMUNITY): Payer: Medicaid Other

## 2019-03-02 ENCOUNTER — Other Ambulatory Visit: Payer: Self-pay

## 2019-03-02 ENCOUNTER — Encounter (HOSPITAL_COMMUNITY): Payer: Self-pay

## 2019-03-02 ENCOUNTER — Inpatient Hospital Stay (HOSPITAL_COMMUNITY)
Admission: EM | Admit: 2019-03-02 | Discharge: 2019-03-07 | DRG: 057 | Disposition: A | Discharge status: Home / Self Care | Payer: Medicaid Other | Attending: Internal Medicine | Admitting: Internal Medicine

## 2019-03-02 DIAGNOSIS — R29818 Other symptoms and signs involving the nervous system: Secondary | ICD-10-CM | POA: Diagnosis present

## 2019-03-02 DIAGNOSIS — Z20828 Contact with and (suspected) exposure to other viral communicable diseases: Secondary | ICD-10-CM | POA: Diagnosis present

## 2019-03-02 DIAGNOSIS — K0889 Other specified disorders of teeth and supporting structures: Secondary | ICD-10-CM | POA: Diagnosis present

## 2019-03-02 DIAGNOSIS — G259 Extrapyramidal and movement disorder, unspecified: Secondary | ICD-10-CM | POA: Diagnosis not present

## 2019-03-02 DIAGNOSIS — E119 Type 2 diabetes mellitus without complications: Secondary | ICD-10-CM | POA: Diagnosis present

## 2019-03-02 DIAGNOSIS — F17210 Nicotine dependence, cigarettes, uncomplicated: Secondary | ICD-10-CM | POA: Diagnosis present

## 2019-03-02 DIAGNOSIS — E876 Hypokalemia: Secondary | ICD-10-CM | POA: Diagnosis present

## 2019-03-02 DIAGNOSIS — G2571 Drug induced akathisia: Principal | ICD-10-CM | POA: Diagnosis present

## 2019-03-02 DIAGNOSIS — F319 Bipolar disorder, unspecified: Secondary | ICD-10-CM | POA: Diagnosis present

## 2019-03-02 DIAGNOSIS — M6282 Rhabdomyolysis: Secondary | ICD-10-CM | POA: Diagnosis present

## 2019-03-02 DIAGNOSIS — G20 Parkinson's disease: Secondary | ICD-10-CM | POA: Diagnosis present

## 2019-03-02 DIAGNOSIS — M2740 Unspecified cyst of jaw: Secondary | ICD-10-CM | POA: Diagnosis present

## 2019-03-02 DIAGNOSIS — I4581 Long QT syndrome: Secondary | ICD-10-CM | POA: Diagnosis present

## 2019-03-02 DIAGNOSIS — Z79899 Other long term (current) drug therapy: Secondary | ICD-10-CM

## 2019-03-02 DIAGNOSIS — R1310 Dysphagia, unspecified: Secondary | ICD-10-CM | POA: Diagnosis present

## 2019-03-02 DIAGNOSIS — T43595A Adverse effect of other antipsychotics and neuroleptics, initial encounter: Secondary | ICD-10-CM | POA: Diagnosis present

## 2019-03-02 DIAGNOSIS — R509 Fever, unspecified: Secondary | ICD-10-CM | POA: Diagnosis not present

## 2019-03-02 DIAGNOSIS — R112 Nausea with vomiting, unspecified: Secondary | ICD-10-CM | POA: Diagnosis not present

## 2019-03-02 LAB — CBC WITH DIFFERENTIAL/PLATELET
Abs Immature Granulocytes: 0.05 10*3/uL (ref 0.00–0.07)
Basophils Absolute: 0 10*3/uL (ref 0.0–0.1)
Basophils Relative: 1 %
Eosinophils Absolute: 0.1 10*3/uL (ref 0.0–0.5)
Eosinophils Relative: 1 %
HCT: 43 % (ref 39.0–52.0)
Hemoglobin: 14.2 g/dL (ref 13.0–17.0)
Immature Granulocytes: 1 %
Lymphocytes Relative: 8 %
Lymphs Abs: 0.6 10*3/uL — ABNORMAL LOW (ref 0.7–4.0)
MCH: 26.5 pg (ref 26.0–34.0)
MCHC: 33 g/dL (ref 30.0–36.0)
MCV: 80.4 fL (ref 80.0–100.0)
Monocytes Absolute: 0.6 10*3/uL (ref 0.1–1.0)
Monocytes Relative: 8 %
Neutro Abs: 6.3 10*3/uL (ref 1.7–7.7)
Neutrophils Relative %: 81 %
Platelets: 303 10*3/uL (ref 150–400)
RBC: 5.35 MIL/uL (ref 4.22–5.81)
RDW: 14.1 % (ref 11.5–15.5)
WBC: 7.7 10*3/uL (ref 4.0–10.5)
nRBC: 0 % (ref 0.0–0.2)

## 2019-03-02 LAB — BASIC METABOLIC PANEL
Anion gap: 9 (ref 5–15)
BUN: <5 mg/dL — ABNORMAL LOW (ref 6–20)
CO2: 21 mmol/L — ABNORMAL LOW (ref 22–32)
Calcium: 7.9 mg/dL — ABNORMAL LOW (ref 8.9–10.3)
Chloride: 105 mmol/L (ref 98–111)
Creatinine, Ser: 0.97 mg/dL (ref 0.61–1.24)
GFR calc Af Amer: >60 mL/min (ref >60)
GFR calc non Af Amer: >60 mL/min (ref >60)
Glucose, Bld: 91 mg/dL (ref 70–99)
Potassium: 4.1 mmol/L (ref 3.5–5.1)
Sodium: 135 mmol/L (ref 135–145)

## 2019-03-02 LAB — URINALYSIS, ROUTINE W REFLEX MICROSCOPIC
Bacteria, UA: NONE SEEN
Bilirubin Urine: NEGATIVE
Glucose, UA: NEGATIVE mg/dL
Ketones, ur: NEGATIVE mg/dL
Leukocytes,Ua: NEGATIVE
Nitrite: NEGATIVE
Protein, ur: NEGATIVE mg/dL
Specific Gravity, Urine: 1.003 — ABNORMAL LOW (ref 1.005–1.030)
pH: 6 (ref 5.0–8.0)

## 2019-03-02 LAB — RAPID URINE DRUG SCREEN, HOSP PERFORMED
Amphetamines: NOT DETECTED
Barbiturates: NOT DETECTED
Benzodiazepines: NOT DETECTED
Cocaine: NOT DETECTED
Opiates: NOT DETECTED
Tetrahydrocannabinol: NOT DETECTED

## 2019-03-02 LAB — COMPREHENSIVE METABOLIC PANEL
ALT: 36 U/L (ref 0–44)
AST: 40 U/L (ref 15–41)
Albumin: 3.5 g/dL (ref 3.5–5.0)
Alkaline Phosphatase: 114 U/L (ref 38–126)
Anion gap: 13 (ref 5–15)
BUN: <5 mg/dL — ABNORMAL LOW (ref 6–20)
CO2: 20 mmol/L — ABNORMAL LOW (ref 22–32)
Calcium: 9 mg/dL (ref 8.9–10.3)
Chloride: 99 mmol/L (ref 98–111)
Creatinine, Ser: 0.87 mg/dL (ref 0.61–1.24)
GFR calc Af Amer: >60 mL/min (ref >60)
GFR calc non Af Amer: >60 mL/min (ref >60)
Glucose, Bld: 93 mg/dL (ref 70–99)
Potassium: 3.1 mmol/L — ABNORMAL LOW (ref 3.5–5.1)
Sodium: 132 mmol/L — ABNORMAL LOW (ref 135–145)
Total Bilirubin: 1.8 mg/dL — ABNORMAL HIGH (ref 0.3–1.2)
Total Protein: 6.6 g/dL (ref 6.5–8.1)

## 2019-03-02 LAB — MAGNESIUM: Magnesium: 1.6 mg/dL — ABNORMAL LOW (ref 1.7–2.4)

## 2019-03-02 LAB — TROPONIN I (HIGH SENSITIVITY)
Troponin I (High Sensitivity): 8 ng/L (ref <18)
Troponin I (High Sensitivity): 7 ng/L (ref <18)

## 2019-03-02 LAB — CK
Total CK: 1165 U/L — ABNORMAL HIGH (ref 49–397)
Total CK: 963 U/L — ABNORMAL HIGH (ref 49–397)

## 2019-03-02 LAB — CBG MONITORING, ED: Glucose-Capillary: 100 mg/dL — ABNORMAL HIGH (ref 70–99)

## 2019-03-02 LAB — SARS CORONAVIRUS 2 (TAT 6-24 HRS): SARS Coronavirus 2: NEGATIVE

## 2019-03-02 MED ORDER — SODIUM CHLORIDE 0.9 % IV SOLN
INTRAVENOUS | Status: DC
  Administered 2019-03-02 – 2019-03-07 (×16): via INTRAVENOUS

## 2019-03-02 MED ORDER — DIPHENHYDRAMINE HCL 50 MG/ML IJ SOLN: 50.0000 mg | Freq: Three times a day (TID) | INTRAMUSCULAR | Status: DC | PRN

## 2019-03-02 MED ORDER — DIPHENHYDRAMINE HCL 50 MG/ML IJ SOLN
50.0000 mg | Freq: Three times a day (TID) | INTRAMUSCULAR | Status: DC
  Administered 2019-03-02: 50 mg via INTRAVENOUS

## 2019-03-02 MED ORDER — MAGNESIUM SULFATE IN D5W 1-5 GM/100ML-% IV SOLN
1.0000 g | Freq: Once | INTRAVENOUS | Status: AC
  Administered 2019-03-02: 1 g via INTRAVENOUS
  Filled 2019-03-02: qty 100

## 2019-03-02 MED ORDER — POTASSIUM CHLORIDE IN NACL 40-0.9 MEQ/L-% IV SOLN
INTRAVENOUS | Status: DC
  Administered 2019-03-02: 16:00:00 200 mL/h via INTRAVENOUS
  Filled 2019-03-02 (×2): qty 1000

## 2019-03-02 MED ORDER — PANTOPRAZOLE SODIUM 40 MG PO TBEC
40.0000 mg | DELAYED_RELEASE_TABLET | Freq: Two times a day (BID) | ORAL | Status: DC
  Administered 2019-03-02 – 2019-03-07 (×10): 40 mg via ORAL
  Filled 2019-03-02 (×10): qty 1

## 2019-03-02 MED ORDER — DIPHENHYDRAMINE HCL 50 MG/ML IJ SOLN
50.0000 mg | Freq: Three times a day (TID) | INTRAMUSCULAR | Status: DC
  Administered 2019-03-02: 50 mg via INTRAMUSCULAR
  Filled 2019-03-02 (×2): qty 1

## 2019-03-02 MED ORDER — DIPHENHYDRAMINE HCL 50 MG/ML IJ SOLN: 50.0000 mg | Freq: Once | INTRAMUSCULAR | Status: DC

## 2019-03-02 MED ORDER — LORAZEPAM 2 MG/ML IJ SOLN
1.0000 mg | Freq: Once | INTRAMUSCULAR | Status: AC
  Administered 2019-03-02: 1 mg via INTRAVENOUS
  Filled 2019-03-02: qty 1

## 2019-03-02 MED ORDER — ENOXAPARIN SODIUM 40 MG/0.4ML ~~LOC~~ SOLN
40.0000 mg | SUBCUTANEOUS | Status: DC
  Administered 2019-03-02 – 2019-03-06 (×5): 40 mg via SUBCUTANEOUS
  Filled 2019-03-02 (×5): qty 0.4

## 2019-03-02 MED ORDER — CLONAZEPAM 0.5 MG PO TABS
0.5000 mg | ORAL_TABLET | Freq: Three times a day (TID) | ORAL | Status: DC
  Administered 2019-03-02 – 2019-03-06 (×12): 0.5 mg via ORAL
  Filled 2019-03-02 (×12): qty 1

## 2019-03-02 MED ORDER — CLONAZEPAM 0.5 MG PO TBDP: 0.5000 mg | ORAL_TABLET | Freq: Three times a day (TID) | ORAL | Status: DC | PRN

## 2019-03-02 MED ORDER — SODIUM CHLORIDE 0.9 % IV BOLUS
500.0000 mL | Freq: Once | INTRAVENOUS | Status: AC
  Administered 2019-03-02: 500 mL via INTRAVENOUS

## 2019-03-02 MED ORDER — POTASSIUM CHLORIDE CRYS ER 20 MEQ PO TBCR
20.0000 meq | EXTENDED_RELEASE_TABLET | Freq: Every day | ORAL | Status: DC
  Administered 2019-03-03 – 2019-03-04 (×2): 20 meq via ORAL
  Filled 2019-03-02 (×2): qty 1

## 2019-03-02 NOTE — H&P (Addendum)
NAME:  Carl Carey, MRN:  [id number], DOB:  10/08/1980, LOS: 0 ADMISSION DATE:  03/02/2019, Primary: System, Pcp Not In  CHIEF COMPLAINT:  shaking  Medical Service: Internal Medicine Teaching Service         Attending Physician: Dr. Velna Ochs, MD    First Contact: Dr. Darrick Meigs Pager: [phone]  Second Contact: Dr. Sharon Seller Pager: [phone]       After Hours (After 5p/  First Contact Pager: [phone]  weekends / holidays): Second Contact Pager: [phone]    History of present illness   38 yo male with PMH of bipolar I disorder who presented to the ED via EMS for shaking. History obtained from chart review and patient. Notes rigidity ,dysphagia and shaking x10d. History of similar episode in past. Further information was unable to be obtained due to patient's dysphasia and him drifting off to sleep. Denies illicit drug use. Does drink alcohol but denies history of withdrawal.  Past Medical History  He,  has a past medical history of Bipolar 1 disorder ([place]), Diabetes ([place]), Malnutrition ([place]), Panic attack, and Parkinsonism ([place]).   Home Medications     Prior to Admission medications   Medication Sig Start Date End Date Taking? Authorizing Provider  ARIPiprazole (ABILIFY) 10 MG tablet Take 1 tablet (10 mg total) by mouth at bedtime. 01/25/19   Shelly Coss, MD  benztropine (COGENTIN) 1 MG tablet Take 1 tablet (1 mg total) by mouth daily. Patient not taking: Reported on 02/24/2019 01/26/19   Shelly Coss, MD  citalopram (CELEXA) 20 MG tablet Take 1 tablet (20 mg total) by mouth at bedtime. Patient not taking: Reported on 02/24/2019 01/25/19   Shelly Coss, MD  haloperidol (HALDOL) 5 MG tablet Take 15 mg by mouth daily.    [provider]  loperamide (IMODIUM) 2 MG capsule Take 1 capsule (2 mg total) by mouth 4 (four) times daily as needed for diarrhea or loose stools. Max 16 mg (8 capsules) per day 02/24/19   Sherwood Gambler, MD  pantoprazole (PROTONIX) 40 MG tablet  Take 1 tablet (40 mg total) by mouth 2 (two) times daily. 02/23/19   Fulp, Cammie, MD  potassium chloride SA (KLOR-CON) 20 MEQ tablet Take 1 tablet (20 mEq total) by mouth daily. 02/24/19   Sherwood Gambler, MD  traZODone (DESYREL) 100 MG tablet Take 2 tablets (200 mg total) by mouth at bedtime. 01/25/19   Shelly Coss, MD    Allergies    Allergies as of 03/02/2019  . (No Known Allergies)    Social History   reports that he has been smoking cigarettes. He has never used smokeless tobacco. He reports current alcohol use. He reports that he does not use drugs.  Family History   His family history includes Hypertension in his maternal grandmother.   ROS  Review of Systems  Constitutional: Negative for chills and fever.  HENT:       Dysphagia  Eyes: Negative for blurred vision.  Respiratory: Negative for shortness of breath.   Cardiovascular: Negative for chest pain.  Gastrointestinal: Negative for abdominal pain.  Genitourinary: Negative for dysuria.  Musculoskeletal:       Shaking, rigidity  Skin: Negative for rash.  Neurological: Negative for dizziness.  Psychiatric/Behavioral:       Bipolar disorder    Objective   Blood pressure (!) 135/103, pulse 84, temperature 98.2 F (36.8 C), temperature source Oral, resp. rate 16, SpO2 98 %.     Examination: GENERAL: somewhat ill appearing male HEENT: head atraumatic.  No conjunctival injection. Nares patent. Latice like film on tongue. CARDIAC: heart RRR.  PULMONARY: acyanotic. Lung sounds clear to auscultation. ABDOMEN: soft. Nontender to palpation.  NEURO: A/O x3. Drifts off to sleep intermittently throughout exam. PERRL. EOMs intact. No facial droopiness. Mask like features. Tongue midline. Speech stuttered and slow. Bradykinesia and rigidity noted throughout with mild shaking. Strength equal and intact of bilateral upper and lower extremities. Clonus present.  SKIN: no rash or lesions on limited exam  PSYCH: tired affect.   ED course  ED provider spoke with Dr. Sheppard Evens (patient's psychiatrist). Recommended benadryl (for EPS sx), clonazepam (for akathisia). Recommends reducing haldol to 39m at bedtime, increasing cogentin 192mtid, and discontinuing Abilify at discharge.  Consults:  Psych  Significant Diagnostic Tests:  EKG: personally reviewed my interpretation is sinus tachycardia. ST elevation in inferior leads. QT prolonged.   CT head: no acute findings. Mucosal thickening in several ethmoid air cells bilaterally. Small retention cysts in posterior left maxillary antrum.   Labs    CBC Latest Ref Rng & Units 03/02/2019 02/26/2019 02/24/2019  WBC 4.0 - 10.5 K/uL 7.7 10.9(H) 10.6(H)  Hemoglobin 13.0 - 17.0 g/dL 14.2 14.0 12.4(L)  Hematocrit 39.0 - 52.0 % 43.0 44.7 39.5  Platelets 150 - 400 K/uL 303 303 263   CMP Latest Ref Rng & Units 03/02/2019 02/26/2019 02/24/2019  Glucose 70 - 99 mg/dL 93 147(H) 83  BUN 6 - 20 mg/dL <5(L) <5(L) <5(L)  Creatinine 0.61 - 1.24 mg/dL 0.87 0.92 0.71  Sodium 135 - 145 mmol/L 132(L) 136 136  Potassium 3.5 - 5.1 mmol/L 3.1(L) 3.4(L) 3.3(L)  Chloride 98 - 111 mmol/L 99 105 106  CO2 22 - 32 mmol/L 20(L) 18(L) 23  Calcium 8.9 - 10.3 mg/dL 9.0 8.7(L) 8.4(L)  Total Protein 6.5 - 8.1 g/dL 6.6 - 6.2(L)  Total Bilirubin 0.3 - 1.2 mg/dL 1.8(H) - 1.2  Alkaline Phos 38 - 126 U/L 114 - 77  AST 15 - 41 U/L 40 - 25  ALT 0 - 44 U/L 36 - 44  Mg 1.6 Troponin8-->7 UDS neg UA unremarkable aside from small amount of hgb present CK 1165 Summary  [age]o male with PMH of bipolar I disorder who presented to ED on 10/30 for 10d hx of shaking and dysphagia. Patient admitted to internal medicine teaching service for further management with the assistance of Dr. FaSheppard Evensrom psychiatry.   Assessment & Plan:  Active Problems:   * No active hospital problems. *  Bipolar I Disorder EPS. On haldol, congentin, abilify, celexa, and trazadone at home. Symptoms including bradykinesia, akathesia,  rigidity and tremor slowly improving. SLP evaluated patient in ED noting no swallowing issues at this time. ED provider spoke to psychiatry who offered recommendations. Rhabdomyelitis 2/2 EPS. CK 1165 on admission. Small amount of hgb present on UA. Renal function normal.  Hypokalemia--would expect this to rise with rhabdomyelitis. Appears to be a chronic issue. Mag 1.6. received 6033mK in ED Plan Per psych recommendations: benadryl 74m72m TID prn (for EPS), clonazapam TID for akathesia. Decrease haldol to 10mg41m d/c abilify. Increase cogentin to 1mg T62mat discharge.  IVF for rhabdomyelitis. Will continue to monitor electrolytes and CK Reordered home potassium supplement.  Inferior lead ST elevation. Unlikely to be ACS. Trops normal. Patient denying CP. Will repeat EKG in am.  Best practice:  CODE STATUS: FULL Diet: diabetic. Aspiration precautions IV fluids: will start NS @ 274mL/h46mter current infusion is complete GI prophylaxis: protonix DVT for prophylaxis:  lovenox Dispo: Admit patient to Observation with expected length of stay less than 2 midnights.  Mitzi Hansen, MD INTERNAL MEDICINE RESIDENT PGY-1 03/02/19 3:24 PM

## 2019-03-02 NOTE — ED Triage Notes (Signed)
Pt brought to ED via [facility] from home. EMS reports pt called 911 after waking up from sleep shaking uncontrollably, states he was having a seizure. Pt was sitting on front porch when EMS arrived and was A&O x4, reported no loss of consciousness. Pt is currently A&O x4.

## 2019-03-02 NOTE — Discharge Instructions (Addendum)
Thank you for allowing the internal medicine team to participate in your care. Please follow the instructions for medication changes carefully. You will be reducing the haldol to 50m at night, increasing the Congentin to 147m3x per day, and completely stopping the abilify. I would encourage you to follow up with your PCP within 5 days and with Dr. FaSheppard Evenst his next available appointment.

## 2019-03-02 NOTE — ED Provider Notes (Addendum)
[facility] EMERGENCY DEPARTMENT Provider Note   CSN: [id number] Arrival date & time: 03/02/19  0745  History    Chief Complaint  Patient presents with  . Seizures    HPI Carl Carey is a 38 y.o. male with PMHx s/f bipolar I, diabetes, stutter, who presents to the ED with shaking and difficulty eating.  Patient reports that these symptoms have been going on for 10 days.  Per chart review, patient is seen monitoring by psychiatry, however I am unable to find any psychiatry notes in his chart.  It appears that patient followed up with his family medicine NP at [facility] on 02/23/2019 for hospital follow-up.  Patient has a difficult time providing history as he has severe stutter that can take him minutes to speak one sentence.  Patient also reports dysphagia x10 days.  He reports that he has been unable to swallow anything and that he gets stuck in his throat.  Afterwards it will come out. Patient denies any fevers, chills, changes in vision, headaches, abdominal pain, chest pain, difficulty breathing.  He does report that he has been very rigid and slow moving.  He reports that this is happened to him before 1 time.  He is not able to tell me what medications he is on at this time.  Collateral information: I was able to call mom who reports that she did know that the son was in the ED today.  She reports that she does not help take care of him and that he is usually able to do everything on his own.  She does not know what medications he is on.  She does not know who his psychiatrist is.  She believes that he has not been to a doctor for his psychiatric medications and reports that he picks them up from [facility].  At home, patient lives with mom and mom's brother.  She reports he goes to [facility] on [address].  Allergies: Patient has no known allergies. Medications:  Current Outpatient Medications  Medication Instructions  . ARIPiprazole (ABILIFY) 10 mg, Oral, Daily at  bedtime  . benztropine (COGENTIN) 1 mg, Oral, Daily  . citalopram (CELEXA) 20 mg, Oral, Daily at bedtime  . haloperidol (HALDOL) 15 mg, Oral, Daily  . loperamide (IMODIUM) 2 mg, Oral, 4 times daily PRN, Max 16 mg (8 capsules) per day  . pantoprazole (PROTONIX) 40 mg, Oral, 2 times daily  . potassium chloride SA (KLOR-CON) 20 MEQ tablet 20 mEq, Oral, Daily  . traZODone (DESYREL) 200 mg, Oral, Daily at bedtime    Past Medical/Surgical History Past Medical History:  Diagnosis Date  . Bipolar 1 disorder ([place])   . Diabetes ([place])   . Malnutrition ([place])   . Panic attack   . Parkinsonism [facility])     Patient Active Problem List   Diagnosis Date Noted  . Extrapyramidal movements present   . Bipolar disorder, in partial remission, most recent episode manic ([place])   . Septic shock ([place]) 01/13/2019  . Bipolar 1 disorder ([place]) 11/09/2018  . Fever in adult 07/25/2014  . Protein-calorie malnutrition, severe ([place]) 07/23/2014  . Parkinsonian features 07/21/2014  . Dysphagia 07/21/2014  . Microcytic anemia 07/21/2014    Past Surgical History:  Procedure Laterality Date  . BIOPSY  01/21/2019   Procedure: BIOPSY;  Surgeon: Otis Brace, MD;  Location: [facility];  Service: Gastroenterology;;  . COLONOSCOPY WITH PROPOFOL N/A 01/21/2019   Procedure: COLONOSCOPY WITH PROPOFOL;  Surgeon: Otis Brace, MD;  Location: [facility]  ENDOSCOPY;  Service: Gastroenterology;  Laterality: N/A;  . ESOPHAGOGASTRODUODENOSCOPY (EGD) WITH PROPOFOL N/A 01/21/2019   Procedure: ESOPHAGOGASTRODUODENOSCOPY (EGD) WITH PROPOFOL;  Surgeon: Otis Brace, MD;  Location: [facility] ENDOSCOPY;  Service: Gastroenterology;  Laterality: N/A;  . MULTIPLE EXTRACTIONS WITH ALVEOLOPLASTY N/A 07/29/2014   Procedure: Extraction of tooth #'s 1,15,16,17,18 with alveoloplasty and gross debridement of teeth;  Surgeon: Lenn Cal, DDS;  Location: [facility];  Service: Oral Surgery;  Laterality: N/A;  . NECK SURGERY        Family History   Problem Relation Age of Onset  . Hypertension Maternal Grandmother    Social History   Tobacco Use  . Smoking status: Current Some Day Smoker    Types: Cigarettes  . Smokeless tobacco: Never Used  Substance Use Topics  . Alcohol use: Yes    Comment: socially  . Drug use: No   Review of Systems Review of Systems  Constitutional: Positive for activity change. Negative for chills and fever.  HENT: Positive for trouble swallowing. Negative for congestion, ear pain, hearing loss, sneezing and sore throat.   Eyes: Negative for pain, redness and visual disturbance.  Respiratory: Negative for cough and shortness of breath.   Cardiovascular: Negative for chest pain, palpitations and leg swelling.  Gastrointestinal: Negative for abdominal distention, abdominal pain, nausea and vomiting.  Genitourinary: Negative for difficulty urinating, dysuria, hematuria and penile pain.  Musculoskeletal: Positive for gait problem. Negative for arthralgias and back pain.  Skin: Negative for color change and rash.  Neurological: Positive for tremors and speech difficulty. Negative for seizures, syncope and headaches.  All other systems reviewed and are negative.   Physical Exam Updated Vital Signs BP (!) 135/103 (BP Location: Right Arm)   Pulse 84   Temp 98.2 F (36.8 C) (Oral)   Resp 16   SpO2 98%   Physical Exam Vitals signs and nursing note reviewed.  Constitutional:      Appearance: He is well-developed.     Comments: Thin African American male  HENT:     Head: Normocephalic and atraumatic.     Right Ear: Tympanic membrane normal.     Left Ear: Tympanic membrane normal.     Nose: Nose normal.     Mouth/Throat:     Mouth: Mucous membranes are moist.     Comments: Poor dentition and oral hygiene. Patient unable to open mouth widely. Eyes:     General: No scleral icterus.    Conjunctiva/sclera: Conjunctivae normal.  Neck:     Musculoskeletal: Neck supple.  Cardiovascular:     Rate and  Rhythm: Normal rate and regular rhythm.     Pulses: Normal pulses.     Heart sounds: No murmur.  Pulmonary:     Effort: Pulmonary effort is normal. No respiratory distress.     Breath sounds: Normal breath sounds.  Abdominal:     General: Abdomen is flat. Bowel sounds are normal. There is no distension.     Palpations: Abdomen is soft.     Tenderness: There is no abdominal tenderness.  Genitourinary:    Penis: Normal.   Musculoskeletal:        General: No swelling.  Lymphadenopathy:     Cervical: No cervical adenopathy.  Skin:    General: Skin is warm and dry.  Neurological:     Mental Status: He is alert and oriented to person, place, and time.     Cranial Nerves: Cranial nerves are intact.     Sensory: Sensation is intact.  Motor: Tremor present.     Deep Tendon Reflexes:     Reflex Scores:      Patellar reflexes are 3+ on the right side and 3+ on the left side.    Comments: Bradykinesia, rigidity, muscle spasm. Did not walk with patient.   Psychiatric:     Comments: Severe stutter, trembling      ED Treatments / Results  Labs (all labs ordered are listed, but only abnormal results are displayed) Labs Reviewed  COMPREHENSIVE METABOLIC PANEL - Abnormal; Notable for the following components:      Result Value   Sodium 132 (*)    Potassium 3.1 (*)    CO2 20 (*)    BUN <5 (*)    Total Bilirubin 1.8 (*)    All other components within normal limits  URINALYSIS, ROUTINE W REFLEX MICROSCOPIC - Abnormal; Notable for the following components:   Color, Urine STRAW (*)    Specific Gravity, Urine 1.003 (*)    Hgb urine dipstick SMALL (*)    All other components within normal limits  CBC WITH DIFFERENTIAL/PLATELET - Abnormal; Notable for the following components:   Lymphs Abs 0.6 (*)    All other components within normal limits  CK - Abnormal; Notable for the following components:   Total CK 1,165 (*)    All other components within normal limits  MAGNESIUM - Abnormal;  Notable for the following components:   Magnesium 1.6 (*)    All other components within normal limits  CBG MONITORING, ED - Abnormal; Notable for the following components:   Glucose-Capillary 100 (*)    All other components within normal limits  SARS CORONAVIRUS 2 (TAT 6-24 HRS)  RAPID URINE DRUG SCREEN, HOSP PERFORMED  TROPONIN I (HIGH SENSITIVITY)  TROPONIN I (HIGH SENSITIVITY)   EKG EKG Interpretation  Date/Time:  Friday March 02 2019 07:47:19 EDT Ventricular Rate:  107 PR Interval:    QRS Duration: 87 QT Interval:  418 QTC Calculation: 558 R Axis:   76 Text Interpretation: Sinus tachycardia Borderline T abnormalities, inferior leads ST elevation, consider inferior injury Prolonged QT interval Artifact in lead(s) I II III aVR aVL aVF V1 V2 V5 V6 poor baseline Confirmed by Wandra Arthurs [phone]) on 03/02/2019 8:17:50 AM   Radiology Ct Head Wo Contrast  Result Date: 03/02/2019 CLINICAL DATA:  Altered level of consciousness EXAM: CT HEAD WITHOUT CONTRAST TECHNIQUE: Contiguous axial images were obtained from the base of the skull through the vertex without intravenous contrast. COMPARISON:  December 23, 2018 FINDINGS: Brain: Ventricles are normal in size and configuration. There is no intracranial mass, hemorrhage, extra-axial fluid collection, or midline shift. The brain parenchyma appears unremarkable. No acute infarct is demonstrable. Vascular: There is no hyperdense vessel. No vascular calcification evident. Skull: Bony calvarium appears intact. Sinuses/Orbits: There is mucosal thickening in several ethmoid air cells bilaterally. There are small retention cysts in the posterior left maxillary antrum. Other paranasal sinuses are clear. Orbits appear symmetric bilaterally. Other: Mastoid air cells are clear. IMPRESSION: Foci of paranasal sinus disease.  Study otherwise unremarkable. Electronically Signed   By: Lowella Grip III M.D.   On: 03/02/2019 10:46    Procedures Procedures  (including critical care time)  Medications Ordered in ED Medications  0.9 %  sodium chloride infusion ( Intravenous New Bag/Given 03/02/19 1007)  magnesium sulfate IVPB 1 g 100 mL (has no administration in time range)  0.9 % NaCl with KCl 40 mEq / L  infusion (has no administration in  time range)  diphenhydrAMINE (BENADRYL) injection 50 mg (has no administration in time range)  clonazePAM (KLONOPIN) tablet 0.5 mg (has no administration in time range)  diphenhydrAMINE (BENADRYL) injection 50 mg (has no administration in time range)  sodium chloride 0.9 % bolus 500 mL (0 mLs Intravenous Stopped 03/02/19 0959)  LORazepam (ATIVAN) injection 1 mg (1 mg Intravenous Given 03/02/19 0900)    Initial Impression / Assessment and Plan / ED Course  I have reviewed the triage vital signs and the nursing notes. Pertinent labs & imaging results that were available during my care of the patient were reviewed by me and considered in my medical decision making (see chart for details). 38 year old African-American male presenting to the ED with Extrapyramidal symptoms likely due to his home psychiatric medications for which neither patient nor patient's mom is able to tell us which medications he is currently taking.  I will call [facility] on [address] to see what prescriptions have been filled recently.  I am not sure that patient follows with anybody with psychiatry. Current mediations per our records include Abilify, Cogentin, Celexa, Haldol, Imodium, Protonix, Klor-Con, Desyrel. Called to [facility] to obtain fill information. Per pharmacy, patient most recently filled Haloperidol 5 mg on 12/20/18. He has many other medications on his profile, but none most recently filled. Patient goes to [facility] in [place] and prescribed by Johnn Hai, MD (psychiatry) [phone].   Dysphagia: unclear if due to rigidity vs achalasia 2/2. Will consult to speech for further evaluation.   Lab work  up as above.  Clinical Course as of Mar 01 1452  Fri Mar 02, 2019  1228 Hemoglobin: 14.2 [RK]  1229 WBC: 7.7 [RK]    Clinical Course User Index [RK] Wilber Oliphant, MD   2:53 PM Replete IV Mag and IV K as pt has not been evaluated by SLP just yet. If passes swallow, will provide PO K. Ck elevated, giving another 1 L bolus.   2:53 PM SLP saw patient and see no issues with swallowing, more likely due to rigidity. Otherwise swallowing well. Likely will improve with rigidity.   2:53 PM Spoke with Dr. Jake Samples, will admit patient for observation and improvement given little help at home.  (Mom reports that she does not help patient with any of his medical needs). Will admit with the following recs per Dr. Jake Samples:  Treatment Plan Summary: For acute treatment agree with the Ativan will add some Benadryl as well.  We will recommend reducing doses of antipsychotics.  The following treatment plan:  Administer Benadryl 50 mg [IV or PO] up to 3 times daily today while here to resolve the acute EPS  Administer [0.5 mg] clonazepam up to 3 times daily while here in the ER to resolve the akathisia  Upon discharge   reduce Haldol to just 10 mg at bedtime [hold haldol 10/30 PM given symptoms],   discontinue aripiprazole,   escalate Cogentin to 1 mg 3 times daily at discharge.    Med reconciliation form is done and pended  Disposition: Symptoms should improve with the measures listed above and can be safely discharged home at that point no acute psychosis.  But recommendations as above  Final Clinical Impressions(s) / ED Diagnoses   Final diagnoses:  Extrapyramidal movements present  Akathisia   ED Discharge Orders         Ordered    haloperidol (HALDOL) 5 MG tablet  Daily at bedtime     Pending  benztropine (COGENTIN) 1 MG tablet  3 times daily     Pending    clonazePAM (KLONOPIN) 0.5 MG tablet  3 times daily     Pending         Disposition: Appreciate IMTS team who accepted this  patient for admission   Wilber Oliphant, M.D. FM PGY-2       Wilber Oliphant, MD 03/02/19 1454    Drenda Freeze, MD 03/06/19 [phone]

## 2019-03-02 NOTE — Evaluation (Signed)
Clinical/Bedside Swallow Evaluation Patient Details  Name: Carl Carey MRN: [id number] Date of Birth: 04-Dec-1980  Today's Date: 03/02/2019 Time: SLP Start Time (ACUTE ONLY): 74 SLP Stop Time (ACUTE ONLY): 1257 SLP Time Calculation (min) (ACUTE ONLY): 17 min  Past Medical History:  Past Medical History:  Diagnosis Date  . Bipolar 1 disorder ([place])   . Diabetes ([place])   . Malnutrition ([place])   . Panic attack   . Parkinsonism [facility])    Past Surgical History:  Past Surgical History:  Procedure Laterality Date  . BIOPSY  01/21/2019   Procedure: BIOPSY;  Surgeon: Otis Brace, MD;  Location: [place];  Service: Gastroenterology;;  . COLONOSCOPY WITH PROPOFOL N/A 01/21/2019   Procedure: COLONOSCOPY WITH PROPOFOL;  Surgeon: Otis Brace, MD;  Location: [place];  Service: Gastroenterology;  Laterality: N/A;  . ESOPHAGOGASTRODUODENOSCOPY (EGD) WITH PROPOFOL N/A 01/21/2019   Procedure: ESOPHAGOGASTRODUODENOSCOPY (EGD) WITH PROPOFOL;  Surgeon: Otis Brace, MD;  [facility];  Service: Gastroenterology;  Laterality: N/A;  . MULTIPLE EXTRACTIONS WITH ALVEOLOPLASTY N/A 07/29/2014   Procedure: Extraction of tooth #'s 1,15,16,17,18 with alveoloplasty and gross debridement of teeth;  Surgeon: Lenn Cal, DDS;  Location: [place];  Service: Oral Surgery;  Laterality: N/A;  . NECK SURGERY     HPI:  Carl Carey is a 38 y.o. year-old male with a history of bipolar disorder, diabetes, panic attacks presenting to the ED with chief complaint of tachycardia. Patient is a challenging historian due to psychiatric illness and stutter.  Explains that he was in the emergency department a few days ago but his dental pain is improved and his diarrhea is resolved.   Assessment / Plan / Recommendation Clinical Impression  Pt demonstrates intact swallow function subjectivley; He is able to drink water consecutively with automatic reponse, no coughing or oral dysphagia noted. He is  at times shaking, has a severe stutter and is difficult to comprehend. When taking bites of applesauce, self fed there are instances of oral holding with delayed swallow. Pt also iniaited mastication of cracker, paused with some shaking and then completed mastication after verbal cues given. Overall, pt has ability to masticate and swallow, but mentation/cognition interferes with intake. Pt can resume a regular diet, but may struggle with intake or with full nutritional support. If admitted, will f/u acutely though pt expected to d/c home.  SLP Visit Diagnosis: Dysphagia, unspecified (R13.10)    Aspiration Risk  Mild aspiration risk;Risk for inadequate nutrition/hydration    Diet Recommendation Regular;Thin liquid   Liquid Administration via: Cup;Straw Supervision: Staff to assist with self feeding Compensations: Slow rate;Small sips/bites Postural Changes: Seated upright at 90 degrees    Other  Recommendations     Follow up Recommendations 24 hour supervision/assistance      Frequency and Duration min 2x/week          Prognosis        Swallow Study   General HPI: Carl Carey is a 38 y.o. year-old male with a history of bipolar disorder, diabetes, panic attacks presenting to the ED with chief complaint of tachycardia. Patient is a challenging historian due to psychiatric illness and stutter.  Explains that he was in the emergency department a few days ago but his dental pain is improved and his diarrhea is resolved. Type of Study: Bedside Swallow Evaluation Diet Prior to this Study: NPO Temperature Spikes Noted: No Respiratory Status: Room air History of Recent Intubation: No Behavior/Cognition: Alert;Cooperative;Requires cueing Oral Cavity Assessment: Within Functional Limits Oral Care Completed by  SLP: No Oral Cavity - Dentition: Adequate natural dentition Vision: Functional for self-feeding Self-Feeding Abilities: Total assist Patient Positioning: Upright in bed Baseline  Vocal Quality: Normal Volitional Cough: Strong Volitional Swallow: Able to elicit    Oral/Motor/Sensory Function Overall Oral Motor/Sensory Function: Within functional limits   Ice Chips     Thin Liquid Thin Liquid: Within functional limits Presentation: Cup;Self Fed    Nectar Thick Nectar Thick Liquid: Not tested   Honey Thick Honey Thick Liquid: Not tested   Puree Puree: Impaired Presentation: Spoon Oral Phase Functional Implications: Oral holding   Solid     Solid: Impaired Presentation: Self Fed Oral Phase Functional Implications: Oral holding     Carl [place], MA CCC-SLP  Acute Rehabilitation Services Pager [phone] Office [phone]  Carl Carey, Carl Carey 03/02/2019,2:00 PM

## 2019-03-02 NOTE — ED Notes (Signed)
ED TO INPATIENT HANDOFF REPORT  ED Nurse Name and Phone #: Ever Halberg [phone]  S Name/Age/Gender Carl Carey 38 y.o. male Room/Bed: 018C/018C  Code Status   Code Status: Full Code  Home/SNF/Other Home Patient oriented to: self, place and situation Is this baseline? Yes   Triage Complete: Triage complete  Chief Complaint Seizure  Triage Note Pt brought to ED via [facility] from home. EMS reports pt called 911 after waking up from sleep shaking uncontrollably, states he was having a seizure. Pt was sitting on front porch when EMS arrived and was A&O x4, reported no loss of consciousness. Pt is currently A&O x4.    Allergies No Known Allergies  Level of Care/Admitting Diagnosis ED Disposition    ED Disposition Condition Comment   Admit  Hospital Area: [place] [[id number]]  Level of Care: Telemetry Medical [104]  Covid Evaluation: Asymptomatic Screening Protocol (No Symptoms)  Diagnosis: Extrapyramidal and movement disorder [623762]  Admitting Physician: Aldine Contes [[id number]]  Attending Physician: Aldine Contes [phone]  PT Class (Do Not Modify): Observation [104]  PT Acc Code (Do Not Modify): Observation [10022]       B Medical/Surgery History Past Medical History:  Diagnosis Date  . Bipolar 1 disorder ([place])   . Diabetes ([place])   . Malnutrition ([place])   . Panic attack   . Parkinsonism [facility])    Past Surgical History:  Procedure Laterality Date  . BIOPSY  01/21/2019   Procedure: BIOPSY;  Surgeon: Otis Brace, MD;  Location: [place];  Service: Gastroenterology;;  . COLONOSCOPY WITH PROPOFOL N/A 01/21/2019   Procedure: COLONOSCOPY WITH PROPOFOL;  Surgeon: Otis Brace, MD;  Location: [place];  Service: Gastroenterology;  Laterality: N/A;  . ESOPHAGOGASTRODUODENOSCOPY (EGD) WITH PROPOFOL N/A 01/21/2019   Procedure: ESOPHAGOGASTRODUODENOSCOPY (EGD) WITH PROPOFOL;  Surgeon: Otis Brace, MD;  [facility];   Service: Gastroenterology;  Laterality: N/A;  . MULTIPLE EXTRACTIONS WITH ALVEOLOPLASTY N/A 07/29/2014   Procedure: Extraction of tooth #'s 1,15,16,17,18 with alveoloplasty and gross debridement of teeth;  Surgeon: Lenn Cal, DDS;  Location: [place];  Service: Oral Surgery;  Laterality: N/A;  . NECK SURGERY       A IV Location/Drains/Wounds Patient Lines/Drains/Airways Status   Active Line/Drains/Airways    Name:   Placement date:   Placement time:   Site:   Days:   Peripheral IV 03/02/19 Left Forearm   03/02/19    0802    Forearm   less than 1          Intake/Output Last 24 hours  Intake/Output Summary (Last 24 hours) at 03/02/2019 1659 Last data filed at 03/02/2019 1600 Gross per 24 hour  Intake 600 ml  Output -  Net 600 ml    Labs/Imaging Results for orders placed or performed during the hospital encounter of 03/02/19 (from the past 48 hour(s))  CBG monitoring, ED     Status: Abnormal   Collection Time: 03/02/19  7:53 AM  Result Value Ref Range   Glucose-Capillary 100 (H) 70 - 99 mg/dL  Comprehensive metabolic panel     Status: Abnormal   Collection Time: 03/02/19  8:08 AM  Result Value Ref Range   Sodium 132 (L) 135 - 145 mmol/L   Potassium 3.1 (L) 3.5 - 5.1 mmol/L   Chloride 99 98 - 111 mmol/L   CO2 20 (L) 22 - 32 mmol/L   Glucose, Bld 93 70 - 99 mg/dL   BUN <5 (L) 6 - 20 mg/dL   Creatinine, Ser  0.87 0.61 - 1.24 mg/dL   Calcium 9.0 8.9 - 10.3 mg/dL   Total Protein 6.6 6.5 - 8.1 g/dL   Albumin 3.5 3.5 - 5.0 g/dL   AST 40 15 - 41 U/L   ALT 36 0 - 44 U/L   Alkaline Phosphatase 114 38 - 126 U/L   Total Bilirubin 1.8 (H) 0.3 - 1.2 mg/dL   GFR calc non Af Amer >60 >60 mL/min   GFR calc Af Amer >60 >60 mL/min   Anion gap 13 5 - 15    Comment: Performed at [facility] [address], [place], [place] [address]  Troponin I (High Sensitivity)     Status: None   Collection Time: 03/02/19  8:08 AM  Result Value Ref Range   Troponin I (High Sensitivity) 8  <18 ng/L    Comment: (NOTE) Elevated high sensitivity troponin I (hsTnI) values and significant  changes across serial measurements may suggest ACS but many other  chronic and acute conditions are known to elevate hsTnI results.  Refer to the Links section for chest pain algorithms and additional  guidance. Performed at [facility], [place] [address], [place], [place] [address]   CBC with Differential     Status: Abnormal   Collection Time: 03/02/19  8:08 AM  Result Value Ref Range   WBC 7.7 4.0 - 10.5 K/uL   RBC 5.35 4.22 - 5.81 MIL/uL   Hemoglobin 14.2 13.0 - 17.0 g/dL   HCT 43.0 39.0 - 52.0 %   MCV 80.4 80.0 - 100.0 fL   MCH 26.5 26.0 - 34.0 pg   MCHC 33.0 30.0 - 36.0 g/dL   RDW 14.1 11.5 - 15.5 %   Platelets 303 150 - 400 K/uL   nRBC 0.0 0.0 - 0.2 %   Neutrophils Relative % 81 %   Neutro Abs 6.3 1.7 - 7.7 K/uL   Lymphocytes Relative 8 %   Lymphs Abs 0.6 (L) 0.7 - 4.0 K/uL   Monocytes Relative 8 %   Monocytes Absolute 0.6 0.1 - 1.0 K/uL   Eosinophils Relative 1 %   Eosinophils Absolute 0.1 0.0 - 0.5 K/uL   Basophils Relative 1 %   Basophils Absolute 0.0 0.0 - 0.1 K/uL   Immature Granulocytes 1 %   Abs Immature Granulocytes 0.05 0.00 - 0.07 K/uL    Comment: Performed at [facility] [address], [place], [place] [address]  CK     Status: Abnormal   Collection Time: 03/02/19  8:08 AM  Result Value Ref Range   Total CK 1,165 (H) 49 - 397 U/L    Comment: Performed at [facility], [place] [address], [place], [place] [address]  Magnesium     Status: Abnormal   Collection Time: 03/02/19  8:08 AM  Result Value Ref Range   Magnesium 1.6 (L) 1.7 - 2.4 mg/dL    Comment: Performed at [facility] [address], [place],  [address]  Urine rapid drug screen (hosp performed)     Status: None   Collection Time: 03/02/19  8:28 AM  Result Value Ref Range   Opiates NONE DETECTED NONE DETECTED   Cocaine NONE DETECTED NONE DETECTED   Benzodiazepines  NONE DETECTED NONE DETECTED   Amphetamines NONE DETECTED NONE DETECTED   Tetrahydrocannabinol NONE DETECTED NONE DETECTED   Barbiturates NONE DETECTED NONE DETECTED    Comment: (NOTE) DRUG SCREEN FOR MEDICAL PURPOSES ONLY.  IF CONFIRMATION IS NEEDED  FOR ANY PURPOSE, NOTIFY LAB WITHIN 5 DAYS. LOWEST DETECTABLE LIMITS FOR URINE DRUG SCREEN Drug Class                     Cutoff (ng/mL) Amphetamine and metabolites    1000 Barbiturate and metabolites    200 Benzodiazepine                 627 Tricyclics and metabolites     300 Opiates and metabolites        300 Cocaine and metabolites        300 THC                            50 Performed at [facility], [place] [address], [place], [place] [address]   Urinalysis, Routine w reflex microscopic     Status: Abnormal   Collection Time: 03/02/19  8:28 AM  Result Value Ref Range   Color, Urine STRAW (A) YELLOW   APPearance CLEAR CLEAR   Specific Gravity, Urine 1.003 (L) 1.005 - 1.030   pH 6.0 5.0 - 8.0   Glucose, UA NEGATIVE NEGATIVE mg/dL   Hgb urine dipstick SMALL (A) NEGATIVE   Bilirubin Urine NEGATIVE NEGATIVE   Ketones, ur NEGATIVE NEGATIVE mg/dL   Protein, ur NEGATIVE NEGATIVE mg/dL   Nitrite NEGATIVE NEGATIVE   Leukocytes,Ua NEGATIVE NEGATIVE   RBC / HPF 0-5 0 - 5 RBC/hpf   WBC, UA 0-5 0 - 5 WBC/hpf   Bacteria, UA NONE SEEN NONE SEEN   Squamous Epithelial / LPF 0-5 0 - 5    Comment: Performed at [facility], [place] [address] [place], [place] [address]  Troponin I (High Sensitivity)     Status: None   Collection Time: 03/02/19  9:57 AM  Result Value Ref Range   Troponin I (High Sensitivity) 7 <18 ng/L    Comment: (NOTE) Elevated high sensitivity troponin I (hsTnI) values and significant  changes across serial measurements may suggest ACS but many other  chronic and acute conditions are known to elevate hsTnI results.  Refer to the "Links" section for chest pain algorithms and additional  guidance. Performed at  [facility], [place] [address] [place], [place] [address]    Ct Head Wo Contrast  Result Date: 03/02/2019 CLINICAL DATA:  Altered level of consciousness EXAM: CT HEAD WITHOUT CONTRAST TECHNIQUE: Contiguous axial images were obtained from the base of the skull through the vertex without intravenous contrast. COMPARISON:  December 23, 2018 FINDINGS: Brain: Ventricles are normal in size and configuration. There is no intracranial mass, hemorrhage, extra-axial fluid collection, or midline shift. The brain parenchyma appears unremarkable. No acute infarct is demonstrable. Vascular: There is no hyperdense vessel. No vascular calcification evident. Skull: Bony calvarium appears intact. Sinuses/Orbits: There is mucosal thickening in several ethmoid air cells bilaterally. There are small retention cysts in the posterior left maxillary antrum. Other paranasal sinuses are clear. Orbits appear symmetric bilaterally. Other: Mastoid air cells are clear. IMPRESSION: Foci of paranasal sinus disease.  Study otherwise unremarkable. Electronically Signed   By: Lowella Grip III M.D.   On: 03/02/2019 10:46    Pending Labs Unresulted Labs (From admission, onward)    Start     Ordered   03/03/19 0500  CBC  Tomorrow morning,   R     03/02/19 1648   03/03/19 0500  Magnesium  Tomorrow morning,   R  03/02/19 1648   03/03/19 0500  Phosphorus  Tomorrow morning,   R     03/02/19 1648   03/02/19 1648  CK  Now then every 8 hours,   R (with STAT occurrences)     03/02/19 1648   03/02/19 9767  Basic metabolic panel  Now then every 6 hours,   R (with STAT occurrences)     03/02/19 1648   03/02/19 1417  SARS CORONAVIRUS 2 (TAT 6-24 HRS) Nasopharyngeal Nasopharyngeal Swab  (Asymptomatic/Tier 2 Patients Labs)  Once,   STAT    Question Answer Comment  Is this test for diagnosis or screening Screening   Symptomatic for COVID-19 as defined by CDC No   Hospitalized for COVID-19 No   Admitted to ICU for COVID-19 No    Previously tested for COVID-19 Yes   Resident in a congregate (group) care setting No   Employed in healthcare setting No      03/02/19 1416          Vitals/Pain Today's Vitals   03/02/19 1206 03/02/19 1351 03/02/19 1400 03/02/19 1555  BP: 114/76 (!) 135/103 117/72 110/73  Pulse: 84 84 88 80  Resp: 18 16 20 17   Temp:      TempSrc:      SpO2: 98% 98% 98% 98%  PainSc:        Isolation Precautions No active isolations  Medications Medications  0.9 %  sodium chloride infusion ( Intravenous New Bag/Given 03/02/19 1007)  0.9 % NaCl with KCl 40 mEq / L  infusion (200 mL/hr Intravenous New Bag/Given 03/02/19 1557)  diphenhydrAMINE (BENADRYL) injection 50 mg (50 mg Intramuscular Given 03/02/19 1501)  clonazePAM (KLONOPIN) tablet 0.5 mg (0.5 mg Oral Given 03/02/19 1501)  enoxaparin (LOVENOX) injection 40 mg (has no administration in time range)  sodium chloride 0.9 % bolus 500 mL (0 mLs Intravenous Stopped 03/02/19 0959)  LORazepam (ATIVAN) injection 1 mg (1 mg Intravenous Given 03/02/19 0900)  magnesium sulfate IVPB 1 g 100 mL (0 g Intravenous Stopped 03/02/19 1600)    Mobility walks with person assist Low fall risk   Focused Assessments Neuro Assessment Handoff:  Swallow screen pass? Yes  Cardiac Rhythm: Normal sinus rhythm       Neuro Assessment:   Neuro Checks:      Last Documented NIHSS Modified Score:   Has TPA been given? No If patient is a Neuro Trauma and patient is going to OR before floor call report to [place] nurse: [phone] or [phone]     R Recommendations: See Admitting Provider Note  Report given to:   Additional Notes:

## 2019-03-02 NOTE — ED Notes (Signed)
Called for pt x2 for updated vitals. No response

## 2019-03-02 NOTE — Progress Notes (Signed)
Pt arrived unit safely, seizure precaution in place.

## 2019-03-02 NOTE — Consult Note (Signed)
[facility] Face-to-Face Psychiatry Consult   Reason for Consult: Neuroleptic-induced movement disorder suspected Referring Physician:  Dr Darl Householder Patient Identification: Carl Carey MRN:  [id number] Principal Diagnosis: Extraparametal symptoms/history of bipolar disorder Diagnosis: History of bipolar disorder treated with 2 antipsychotics  Total Time spent with patient: 30 minutes  Subjective:   Carl Carey is a 38 y.o. male patient admitted with concerns about sensitivity to his neuroleptic medication. Patient is known to have a bipolar type condition versus schizoaffective disorder, last discharge from our facility on 11/13/2018- He had presented with disorganized thought and behavior, manic symptomatology, and his mother lives in [place] and though the patient is welcome to stay with her he prefers to stay in the [place] area and is intermittently homeless. At baseline he is generally stable but always has some degree of somatic delusions that are generally treatment resistant.  He has demonstrated parkinsonism with antipsychotics in the past.  When he left our facility he was only on the Haldol decanoate because we feared he would be noncompliant with oral medications however at the present time he is apparently on both aripiprazole and Haldol and this is the probable cause of his acute movement issues.  The patient's current symptoms include general stiffness without cogwheel rigidity, he does not appear febrile and he is not sweating though temperature is pending.  He does not have the rigidity associated with neuroleptic malignant syndrome.  He has a great deal of stuttering as well. Marland Kitchen HPI: Patient phoned EMS after what he describes as a shaking spell.  He is difficult to understand and stutters  continually when speaking  Past Psychiatric History: History of bipolar disorder history of Haldol treatment history of sensitivity to neuroleptics  Risk to Self:  Minimal Risk to Others:   None Prior Inpatient Therapy:  Probable follow-up compliance prior Outpatient Therapy:  Last with [place] in July  Past Medical History:  Past Medical History:  Diagnosis Date  . Bipolar 1 disorder ([place])   . Diabetes ([place])   . Malnutrition ([place])   . Panic attack   . Parkinsonism [facility])     Past Surgical History:  Procedure Laterality Date  . BIOPSY  01/21/2019   Procedure: BIOPSY;  Surgeon: Otis Brace, MD;  Location: [place];  Service: Gastroenterology;;  . COLONOSCOPY WITH PROPOFOL N/A 01/21/2019   Procedure: COLONOSCOPY WITH PROPOFOL;  Surgeon: Otis Brace, MD;  Location: [place];  Service: Gastroenterology;  Laterality: N/A;  . ESOPHAGOGASTRODUODENOSCOPY (EGD) WITH PROPOFOL N/A 01/21/2019   Procedure: ESOPHAGOGASTRODUODENOSCOPY (EGD) WITH PROPOFOL;  Surgeon: Otis Brace, MD;  [facility];  Service: Gastroenterology;  Laterality: N/A;  . MULTIPLE EXTRACTIONS WITH ALVEOLOPLASTY N/A 07/29/2014   Procedure: Extraction of tooth #'s 1,15,16,17,18 with alveoloplasty and gross debridement of teeth;  Surgeon: Lenn Cal, DDS;  Location: [place];  Service: Oral Surgery;  Laterality: N/A;  . NECK SURGERY     Family History:  Family History  Problem Relation Age of Onset  . Hypertension Maternal Grandmother    Family Psychiatric  History: neg Social History:  Social History   Substance and Sexual Activity  Alcohol Use Yes   Comment: socially     Social History   Substance and Sexual Activity  Drug Use No    Social History   Socioeconomic History  . Marital status: Single    Spouse name: Not on file  . Number of children: Not on file  . Years of education: Not on file  . Highest education level: Not on file  Occupational History  . Not on file  Social Needs  . Financial resource strain: Not on file  . Food insecurity    Worry: Not on file    Inability: Not on file  . Transportation needs    Medical: Not on file    Non-medical: Not on  file  Tobacco Use  . Smoking status: Current Some Day Smoker    Types: Cigarettes  . Smokeless tobacco: Never Used  Substance and Sexual Activity  . Alcohol use: Yes    Comment: socially  . Drug use: No  . Sexual activity: Not Currently  Lifestyle  . Physical activity    Days per week: Not on file    Minutes per session: Not on file  . Stress: Not on file  Relationships  . Social Herbalist on phone: Not on file    Gets together: Not on file    Attends religious service: Not on file    Active member of club or organization: Not on file    Attends meetings of clubs or organizations: Not on file    Relationship status: Not on file  Other Topics Concern  . Not on file  Social History Narrative  . Not on file   Additional Social History:    Allergies:  No Known Allergies  Labs:  Results for orders placed or performed during the hospital encounter of 03/02/19 (from the past 48 hour(s))  CBG monitoring, ED     Status: Abnormal   Collection Time: 03/02/19  7:53 AM  Result Value Ref Range   Glucose-Capillary 100 (H) 70 - 99 mg/dL  Comprehensive metabolic panel     Status: Abnormal   Collection Time: 03/02/19  8:08 AM  Result Value Ref Range   Sodium 132 (L) 135 - 145 mmol/L   Potassium 3.1 (L) 3.5 - 5.1 mmol/L   Chloride 99 98 - 111 mmol/L   CO2 20 (L) 22 - 32 mmol/L   Glucose, Bld 93 70 - 99 mg/dL   BUN <5 (L) 6 - 20 mg/dL   Creatinine, Ser 0.87 0.61 - 1.24 mg/dL   Calcium 9.0 8.9 - 10.3 mg/dL   Total Protein 6.6 6.5 - 8.1 g/dL   Albumin 3.5 3.5 - 5.0 g/dL   AST 40 15 - 41 U/L   ALT 36 0 - 44 U/L   Alkaline Phosphatase 114 38 - 126 U/L   Total Bilirubin 1.8 (H) 0.3 - 1.2 mg/dL   GFR calc non Af Amer >60 >60 mL/min   GFR calc Af Amer >60 >60 mL/min   Anion gap 13 5 - 15    Comment: Performed at [facility], [address], [place], [place] [address]  Troponin I (High Sensitivity)     Status: None   Collection Time: 03/02/19  8:08 AM  Result Value  Ref Range   Troponin I (High Sensitivity) 8 <18 ng/L    Comment: (NOTE) Elevated high sensitivity troponin I (hsTnI) values and significant  changes across serial measurements may suggest ACS but many other  chronic and acute conditions are known to elevate hsTnI results.  Refer to the Links section for chest pain algorithms and additional  guidance. Performed at [facility], [place] [address], [place], [place] [address]   CBC with Differential     Status: Abnormal   Collection Time: 03/02/19  8:08 AM  Result Value Ref Range   WBC 7.7 4.0 - 10.5 K/uL  RBC 5.35 4.22 - 5.81 MIL/uL   Hemoglobin 14.2 13.0 - 17.0 g/dL   HCT 43.0 39.0 - 52.0 %   MCV 80.4 80.0 - 100.0 fL   MCH 26.5 26.0 - 34.0 pg   MCHC 33.0 30.0 - 36.0 g/dL   RDW 14.1 11.5 - 15.5 %   Platelets 303 150 - 400 K/uL   nRBC 0.0 0.0 - 0.2 %   Neutrophils Relative % 81 %   Neutro Abs 6.3 1.7 - 7.7 K/uL   Lymphocytes Relative 8 %   Lymphs Abs 0.6 (L) 0.7 - 4.0 K/uL   Monocytes Relative 8 %   Monocytes Absolute 0.6 0.1 - 1.0 K/uL   Eosinophils Relative 1 %   Eosinophils Absolute 0.1 0.0 - 0.5 K/uL   Basophils Relative 1 %   Basophils Absolute 0.0 0.0 - 0.1 K/uL   Immature Granulocytes 1 %   Abs Immature Granulocytes 0.05 0.00 - 0.07 K/uL    Comment: Performed at [facility] [address], [place], [place] [address]  CK     Status: Abnormal   Collection Time: 03/02/19  8:08 AM  Result Value Ref Range   Total CK 1,165 (H) 49 - 397 U/L    Comment: Performed at [facility], [place] [address] [place], [place] [address]  Magnesium     Status: Abnormal   Collection Time: 03/02/19  8:08 AM  Result Value Ref Range   Magnesium 1.6 (L) 1.7 - 2.4 mg/dL    Comment: Performed at [facility] [address] [place], [place] [address]  Urine rapid drug screen (hosp performed)     Status: None   Collection Time: 03/02/19  8:28 AM  Result Value Ref Range   Opiates NONE DETECTED NONE DETECTED   Cocaine  NONE DETECTED NONE DETECTED   Benzodiazepines NONE DETECTED NONE DETECTED   Amphetamines NONE DETECTED NONE DETECTED   Tetrahydrocannabinol NONE DETECTED NONE DETECTED   Barbiturates NONE DETECTED NONE DETECTED    Comment: (NOTE) DRUG SCREEN FOR MEDICAL PURPOSES ONLY.  IF CONFIRMATION IS NEEDED FOR ANY PURPOSE, NOTIFY LAB WITHIN 5 DAYS. LOWEST DETECTABLE LIMITS FOR URINE DRUG SCREEN Drug Class                     Cutoff (ng/mL) Amphetamine and metabolites    1000 Barbiturate and metabolites    200 Benzodiazepine                 397 Tricyclics and metabolites     300 Opiates and metabolites        300 Cocaine and metabolites        300 THC                            50 Performed at [facility], [place] [address], [place], [place] [address]   Urinalysis, Routine w reflex microscopic     Status: Abnormal   Collection Time: 03/02/19  8:28 AM  Result Value Ref Range   Color, Urine STRAW (A) YELLOW   APPearance CLEAR CLEAR   Specific Gravity, Urine 1.003 (L) 1.005 - 1.030   pH 6.0 5.0 - 8.0   Glucose, UA NEGATIVE NEGATIVE mg/dL   Hgb urine dipstick SMALL (A) NEGATIVE   Bilirubin Urine NEGATIVE NEGATIVE   Ketones, ur NEGATIVE NEGATIVE mg/dL   Protein, ur NEGATIVE NEGATIVE mg/dL   Nitrite NEGATIVE NEGATIVE  Leukocytes,Ua NEGATIVE NEGATIVE   RBC / HPF 0-5 0 - 5 RBC/hpf   WBC, UA 0-5 0 - 5 WBC/hpf   Bacteria, UA NONE SEEN NONE SEEN   Squamous Epithelial / LPF 0-5 0 - 5    Comment: Performed at [facility], [place] [address] [place], [place] [address]  Troponin I (High Sensitivity)     Status: None   Collection Time: 03/02/19  9:57 AM  Result Value Ref Range   Troponin I (High Sensitivity) 7 <18 ng/L    Comment: (NOTE) Elevated high sensitivity troponin I (hsTnI) values and significant  changes across serial measurements may suggest ACS but many other  chronic and acute conditions are known to elevate hsTnI results.  Refer to the "Links" section for chest pain  algorithms and additional  guidance. Performed at [facility], [place] [address] [place], [place] [address]     Current Facility-Administered Medications  Medication Dose Route Frequency Provider Last Rate Last Dose  . 0.9 %  sodium chloride infusion   Intravenous Continuous Wilber Oliphant, MD 100 mL/hr at 03/02/19 1007    . 0.9 % NaCl with KCl 40 mEq / L  infusion   Intravenous Continuous Wilber Oliphant, MD      . magnesium sulfate IVPB 1 g 100 mL  1 g Intravenous Once Wilber Oliphant, MD       Current Outpatient Medications  Medication Sig Dispense Refill  . ARIPiprazole (ABILIFY) 10 MG tablet Take 1 tablet (10 mg total) by mouth at bedtime. 30 tablet 0  . benztropine (COGENTIN) 1 MG tablet Take 1 tablet (1 mg total) by mouth daily. (Patient not taking: Reported on 02/24/2019) 30 tablet 0  . citalopram (CELEXA) 20 MG tablet Take 1 tablet (20 mg total) by mouth at bedtime. (Patient not taking: Reported on 02/24/2019) 30 tablet 0  . haloperidol (HALDOL) 5 MG tablet Take 15 mg by mouth daily.    Marland Kitchen loperamide (IMODIUM) 2 MG capsule Take 1 capsule (2 mg total) by mouth 4 (four) times daily as needed for diarrhea or loose stools. Max 16 mg (8 capsules) per day 12 capsule 0  . pantoprazole (PROTONIX) 40 MG tablet Take 1 tablet (40 mg total) by mouth 2 (two) times daily. 60 tablet 3  . potassium chloride SA (KLOR-CON) 20 MEQ tablet Take 1 tablet (20 mEq total) by mouth daily. 3 tablet 0  . traZODone (DESYREL) 100 MG tablet Take 2 tablets (200 mg total) by mouth at bedtime. 60 tablet 0    Musculoskeletal: Strength & Muscle Tone: increased Gait & Station: unsteady Patient leans: N/A  Psychiatric Specialty Exam: No fever noted  Stiffness without cogwheel rigidity  Blood pressure 114/76, pulse 84, temperature 98.2 F (36.8 C), temperature source Oral, resp. rate 18, SpO2 98 %.There is no height or weight on file to calculate BMI.  General Appearance: Casual  Eye Contact:  None  Speech:   Stuttering representing akathisia of the vocal cords  Volume:  Increased  Mood:  Anxious  Affect:  Flat  Thought Process:  Goal Directed and Descriptions of Associations: Circumstantial  Orientation:  Other:  Person place situation not exact date  Thought Content:  Denies current auditory or visual hallucinations  Suicidal Thoughts:  No  Homicidal Thoughts:  No  Memory:  Immediate;   Poor Recent;   Poor Remote;   Fair  Judgement:  Fair  Insight:  Fair  Psychomotor Activity:  EPS  Concentration:  Concentration:  Fair and Attention Span: Fair  Recall:  [employer] of Knowledge:  Good  Language:  Fair  Akathisia: Present  Handed:  Right  AIMS (if indicated):     Assets:  Physical Health Resilience  ADL's:  Intact  Cognition:  WNL  Sleep:        Treatment Plan Summary: For acute treatment agree with the Ativan will add some Benadryl as well.  We will recommend reducing doses of antipsychotics.  The following treatment plan: Administer Benadryl 50 mg IM up to 3 times daily today while here to resolve the acute EPS Administer clonazepam up to 3 times daily while here in the ER to resolve the akathisia Upon discharge reduce Haldol to just 10 mg at bedtime, discontinue aripiprazole, escalate Cogentin to 1 mg 3 times daily at discharge.  Med reconciliation form is done and pended  Disposition: Symptoms should improve with the measures listed above and can be safely discharged home at that point no acute psychosis.  But recommendations as above  Johnn Hai, MD 03/02/2019 1:49 PM

## 2019-03-03 DIAGNOSIS — R112 Nausea with vomiting, unspecified: Secondary | ICD-10-CM | POA: Diagnosis not present

## 2019-03-03 DIAGNOSIS — G2570 Drug induced movement disorder, unspecified: Secondary | ICD-10-CM | POA: Diagnosis not present

## 2019-03-03 DIAGNOSIS — R1310 Dysphagia, unspecified: Secondary | ICD-10-CM | POA: Diagnosis present

## 2019-03-03 DIAGNOSIS — Z79899 Other long term (current) drug therapy: Secondary | ICD-10-CM | POA: Diagnosis not present

## 2019-03-03 DIAGNOSIS — T43505A Adverse effect of unspecified antipsychotics and neuroleptics, initial encounter: Secondary | ICD-10-CM

## 2019-03-03 DIAGNOSIS — Z20828 Contact with and (suspected) exposure to other viral communicable diseases: Secondary | ICD-10-CM | POA: Diagnosis present

## 2019-03-03 DIAGNOSIS — M2740 Unspecified cyst of jaw: Secondary | ICD-10-CM | POA: Diagnosis present

## 2019-03-03 DIAGNOSIS — E876 Hypokalemia: Secondary | ICD-10-CM | POA: Diagnosis present

## 2019-03-03 DIAGNOSIS — G251 Drug-induced tremor: Secondary | ICD-10-CM

## 2019-03-03 DIAGNOSIS — M6282 Rhabdomyolysis: Secondary | ICD-10-CM

## 2019-03-03 DIAGNOSIS — F319 Bipolar disorder, unspecified: Secondary | ICD-10-CM | POA: Diagnosis present

## 2019-03-03 DIAGNOSIS — T43595A Adverse effect of other antipsychotics and neuroleptics, initial encounter: Secondary | ICD-10-CM | POA: Diagnosis present

## 2019-03-03 DIAGNOSIS — Z8669 Personal history of other diseases of the nervous system and sense organs: Secondary | ICD-10-CM

## 2019-03-03 DIAGNOSIS — G20 Parkinson's disease: Secondary | ICD-10-CM | POA: Diagnosis present

## 2019-03-03 DIAGNOSIS — F17210 Nicotine dependence, cigarettes, uncomplicated: Secondary | ICD-10-CM | POA: Diagnosis present

## 2019-03-03 DIAGNOSIS — R29818 Other symptoms and signs involving the nervous system: Secondary | ICD-10-CM | POA: Diagnosis present

## 2019-03-03 DIAGNOSIS — R509 Fever, unspecified: Secondary | ICD-10-CM | POA: Diagnosis not present

## 2019-03-03 DIAGNOSIS — K0889 Other specified disorders of teeth and supporting structures: Secondary | ICD-10-CM | POA: Diagnosis present

## 2019-03-03 DIAGNOSIS — I4581 Long QT syndrome: Secondary | ICD-10-CM | POA: Diagnosis present

## 2019-03-03 DIAGNOSIS — E119 Type 2 diabetes mellitus without complications: Secondary | ICD-10-CM | POA: Diagnosis present

## 2019-03-03 DIAGNOSIS — G2571 Drug induced akathisia: Secondary | ICD-10-CM | POA: Diagnosis present

## 2019-03-03 LAB — BASIC METABOLIC PANEL
Anion gap: 10 (ref 5–15)
BUN: <5 mg/dL — ABNORMAL LOW (ref 6–20)
CO2: 18 mmol/L — ABNORMAL LOW (ref 22–32)
Calcium: 7.6 mg/dL — ABNORMAL LOW (ref 8.9–10.3)
Chloride: 110 mmol/L (ref 98–111)
Creatinine, Ser: 0.89 mg/dL (ref 0.61–1.24)
GFR calc Af Amer: >60 mL/min (ref >60)
GFR calc non Af Amer: >60 mL/min (ref >60)
Glucose, Bld: 91 mg/dL (ref 70–99)
Potassium: 3.4 mmol/L — ABNORMAL LOW (ref 3.5–5.1)
Sodium: 138 mmol/L (ref 135–145)

## 2019-03-03 LAB — CBC
HCT: 36.3 % — ABNORMAL LOW (ref 39.0–52.0)
Hemoglobin: 11.8 g/dL — ABNORMAL LOW (ref 13.0–17.0)
MCH: 26.7 pg (ref 26.0–34.0)
MCHC: 32.5 g/dL (ref 30.0–36.0)
MCV: 82.1 fL (ref 80.0–100.0)
Platelets: 267 10*3/uL (ref 150–400)
RBC: 4.42 MIL/uL (ref 4.22–5.81)
RDW: 14.5 % (ref 11.5–15.5)
WBC: 5.5 10*3/uL (ref 4.0–10.5)
nRBC: 0 % (ref 0.0–0.2)

## 2019-03-03 LAB — MAGNESIUM: Magnesium: 1.9 mg/dL (ref 1.7–2.4)

## 2019-03-03 LAB — CK: Total CK: 986 U/L — ABNORMAL HIGH (ref 49–397)

## 2019-03-03 LAB — PHOSPHORUS: Phosphorus: 2.3 mg/dL — ABNORMAL LOW (ref 2.5–4.6)

## 2019-03-03 MED ORDER — RAMELTEON 8 MG PO TABS: 8.0000 mg | ORAL_TABLET | Freq: Every day | ORAL | Status: DC

## 2019-03-03 MED ORDER — RAMELTEON 8 MG PO TABS
8.0000 mg | ORAL_TABLET | Freq: Every day | ORAL | Status: DC
  Administered 2019-03-04 – 2019-03-06 (×4): 8 mg via ORAL
  Filled 2019-03-03 (×6): qty 1

## 2019-03-03 MED ORDER — ACETAMINOPHEN 325 MG PO TABS
650.0000 mg | ORAL_TABLET | Freq: Four times a day (QID) | ORAL | Status: DC | PRN
  Administered 2019-03-03 – 2019-03-06 (×7): 650 mg via ORAL
  Filled 2019-03-03 (×7): qty 2

## 2019-03-03 MED ORDER — LOPERAMIDE HCL 2 MG PO CAPS
2.0000 mg | ORAL_CAPSULE | Freq: Once | ORAL | Status: AC
  Administered 2019-03-03: 2 mg via ORAL
  Filled 2019-03-03: qty 1

## 2019-03-03 MED ORDER — DIPHENHYDRAMINE HCL 50 MG/ML IJ SOLN
50.0000 mg | Freq: Three times a day (TID) | INTRAMUSCULAR | Status: DC
  Administered 2019-03-03 – 2019-03-05 (×9): 50 mg via INTRAVENOUS
  Filled 2019-03-03 (×9): qty 1

## 2019-03-03 NOTE — Plan of Care (Signed)
  Problem: Medication: Goal: Risk for medication side effects will decrease Outcome: Progressing

## 2019-03-03 NOTE — Progress Notes (Signed)
  Speech Language Pathology Treatment: Dysphagia  Patient Details Name: Carl Carey MRN: [id number] DOB: [birth year] Today's Date: 03/03/2019 Time: 6384-6659 SLP Time Calculation (min) (ACUTE ONLY): 22 min  Assessment / Plan / Recommendation Clinical Impression  Patient seen for dysphagia therapy. He was sitting up in bed with breakfast tray (full liquid diet). He was evaluated 03/02/19 and recommended for Reg, thin liquid diet. His diet was downgraded to full liquids, but reason not known fully. Suspect he was struggling with solids. He reported he has difficulty with chewing meats specifically. Observed him with graham cracker and noted prolonged mastication and bolus formation in the setting of whole body tremors and poor coordination. Pt feels he could eat meats that are chopped. Recommend Dys 2, thin liquid diet with meds whole in puree. ST to follow for diet tolerance and diet upgrade.    HPI HPI: Carl Carey is a 38 y.o. year-old male with a history of bipolar disorder, diabetes, panic attacks presenting to the ED with chief complaint of tachycardia. Patient is a challenging historian due to psychiatric illness and stutter.  Explains that he was in the emergency department a few days ago but his dental pain is improved and his diarrhea is resolved.      SLP Plan  Continue with current plan of care       Recommendations  Diet recommendations: Dysphagia 2 (fine chop);Thin liquid Liquids provided via: Cup Medication Administration: Whole meds with puree Supervision: Patient able to self feed Compensations: Slow rate;Small sips/bites Postural Changes and/or Swallow Maneuvers: Seated upright 90 degrees                Plan: Continue with current plan of care       [place], MA, CCC-SLP 03/03/2019 9:03 AM

## 2019-03-03 NOTE — Progress Notes (Signed)
  Date: 03/03/2019  Patient name: Carl Carey  Medical record number: [id number]  Date of birth: [birth year]   I have seen and evaluated Carl Carey and discussed their care with the Residency Team.  In brief, patient is a 38 year old male with a past medical history of bipolar 1 disorder who presented to the ED for episodes of shaking, rigidity and dysphagia over the last 10 days.  Patient has a history of bipolar type I disorder and has been on Haldol and aripiprazole as an outpatient.  He presented to the ED with disorganized thought and behavior as well as episodes of shaking, rigidity and dysphagia over the last 10 days.  Patient does have a history of parkinsonism with antipsychotics in the past and was recently discharged from behavioral health on November 13, 2018 on Haldol alone but has been taking both Haldol and aripiprazole as an outpatient.  No chest pain, no shortness of breath, no palpitations, no diaphoresis, no syncope, no focal weakness, no nausea or vomiting, no abdominal pain, no diarrhea.  Patient does have a history of stuttering and this appears to be currently worsened by his ongoing symptoms.  Today patient complains of persistent shaking in his hands as well as stiffness and difficulty eating secondary to the symptoms.  PMHx, Fam Hx, and/or Soc Hx : As per resident admit note  Vitals:   03/03/19 0107 03/03/19 0425  BP: 100/60 109/68  Pulse: 83 61  Resp: 15 18  Temp: 98.4 F (36.9 C) 98.4 F (36.9 C)  SpO2: 99% 99%   General: Sitting up in bed, NAD CVs: Regular rhythm, normal heart sounds Lungs: CTA bilaterally Abdomen: Soft, nontender, nondistended, normoactive bowel sounds Extremities: No tenderness to palpation, no edema noted HEENT: Normocephalic, atraumatic Psych: Normal affect Neuro: Patient has persistent stuttered speech as well as tremors in both hands  Assessment and Plan: I have seen and evaluated the patient as outlined above. I agree with the  formulated Assessment and Plan as detailed in the residents' note, with the following changes:   1.  Extrapyramidal symptoms secondary to antipsychotics: -Patient presented to the ED with episodes of shaking in his hands as well as dysphagia and rigidity in the setting of being on Haldol as well as aripiprazole as an outpatient.  Patient symptoms are likely secondary to EPS from the combination of antipsychotics as an outpatient. -Psych follow-up and recommendations appreciated -Discontinue aripiprazole and reduce Haldol to just 10 mg at bedtime -We will increase Cogentin to 1 mg 3 times a day at discharge -Continue with clonazepam up to 3 times daily to help resolve his tremors -Patient also received Benadryl 50 mg IM up to 3 times daily to resolve his acute extrapyramidal symptoms -Patient also noted to have mildly elevated CK which has remained stable.  We will continue with IV fluids for now. -Given patient's persistent tremors he may need to be observed in the hospital for another day.  If his symptoms improve he should be stable for DC tomorrow  Aldine Contes, MD 10/31/202012:00 PM

## 2019-03-03 NOTE — Progress Notes (Signed)
   NAME:  Carl Carey, MRN:  [id number], DOB:  01/03/1981, LOS: 0 ADMISSION DATE:  03/02/2019, Primary: System, Pcp Not In  CHIEF COMPLAINT:  shaking  Medical Service: Internal Medicine Teaching Service         Attending Physician: Dr. Aldine Contes, MD    First Contact: Dr. Darrick Meigs Pager: [phone]  Second Contact: Dr. Koleen Distance Pager: [phone]       After Hours (After 5p/  First Contact Pager: [phone]  weekends / holidays): Second Contact Pager: [phone]    Brief History  38 year old male with bipolar 1 disorder who presents the ED with EPS.  Thought to be secondary to issues with home medications that include Haldol and Abilify.  1 prior similar episode in the past.  Psychiatry is consulted and patient was admitted under the internal medicine teaching service for overnight observation and management of EPS with associated rhabdomyolysis  Subjective  Patient is sitting up in bed attempting to eat.  Having difficult getting the food to his mouth.  Discussed plan for further observation given minimal improvement since yesterday.  Objective   Blood pressure 109/68, pulse 61, temperature 98.4 F (36.9 C), temperature source Oral, resp. rate 18, weight 67 kg, SpO2 99 %.     Intake/Output Summary (Last 24 hours) at 03/03/2019 1136 Last data filed at 03/03/2019 0900 Gross per 24 hour  Intake 2020.69 ml  Output 2375 ml  Net -354.31 ml   Filed Weights   03/03/19 0330  Weight: 67 kg    Examination: GENERAL: in no acute distress CARDIAC: heart RRR. No peripheral edema.  PULMONARY: acyanotic. Lung sounds clear to auscultation. ABDOMEN: soft. Nontender to palpation.   NEURO: Speech remains stuttered.  Bradykinesia and rigidity still present however somewhat improved.  Tremor still present. SKIN: no rash or lesions on limited exam  PSYCH: Normal affect  Consults:  Psych  Significant Diagnostic Tests:  10/30 CT head: no acute changes 10/30 EKG: minimal ST elevation in  inferior leads 10/31 EKG: sinus rhythm  Labs   CK 1165-->986 UDS neg Troponins on admission: 8-->7  Summary  38 yo male with bipolar I disorder admitted to the internal medicine teaching service for EPS management. Likely 2/2 home medication interaction--haldol +abilify.Psych consulted on admission for recommendations.  Assessment & Plan:  Active Problems:   Bipolar 1 disorder (HCC)   Extrapyramidal and movement disorder  Bipolar I disorder EPS 2/2 home medication interaction. On haldol, congentin, abilify, celexa, and trazadone at home.  Still symptomatic this morning.  No Benadryl was given since 8 PM last night.  Will hopefully improve following this morning's dose.  Speech evaluated this morning and recommending dysphagia 2 diet Rhabdomyolysis secondary to EPS.  Improving. Plan Continue Benadryl 50 mg 3 times daily Clonazepam 3 times daily Will need medication adjustments on discharge.  Psychiatry offered the following recommendations: Decrease Haldol to 10 mg at bedtime, discontinue Abilify.  Increase Cogentin to 1 mg 3 times daily. Continue IV fluids Repeat BMP and CK in a.m. or prior to discharge  Inferior lead ST elevation resolved.   Best practice:  CODE STATUS: Full Diet: Dysphagia 2 diet DVT for prophylaxis: Lovenox Dispo: Pending clinical improvement.  Suspect that would be later today or tomorrow.   Mitzi Hansen, MD INTERNAL MEDICINE RESIDENT PGY-1 PAGER #: [phone] 03/03/19 11:36 AM

## 2019-03-04 DIAGNOSIS — R29818 Other symptoms and signs involving the nervous system: Secondary | ICD-10-CM

## 2019-03-04 LAB — BASIC METABOLIC PANEL
Anion gap: 9 (ref 5–15)
BUN: <5 mg/dL — ABNORMAL LOW (ref 6–20)
CO2: 22 mmol/L (ref 22–32)
Calcium: 7.5 mg/dL — ABNORMAL LOW (ref 8.9–10.3)
Chloride: 106 mmol/L (ref 98–111)
Creatinine, Ser: 0.95 mg/dL (ref 0.61–1.24)
GFR calc Af Amer: >60 mL/min (ref >60)
GFR calc non Af Amer: >60 mL/min (ref >60)
Glucose, Bld: 96 mg/dL (ref 70–99)
Potassium: 3.1 mmol/L — ABNORMAL LOW (ref 3.5–5.1)
Sodium: 137 mmol/L (ref 135–145)

## 2019-03-04 LAB — CK: Total CK: 447 U/L — ABNORMAL HIGH (ref 49–397)

## 2019-03-04 MED ORDER — POTASSIUM CHLORIDE CRYS ER 20 MEQ PO TBCR
30.0000 meq | EXTENDED_RELEASE_TABLET | Freq: Two times a day (BID) | ORAL | Status: AC
  Administered 2019-03-04 (×2): 30 meq via ORAL
  Filled 2019-03-04 (×2): qty 1

## 2019-03-04 NOTE — Progress Notes (Signed)
Temp 101.2.  Tylenol 650 given

## 2019-03-04 NOTE — Progress Notes (Signed)
   Subjective: Had trouble sleeping overnight. Still having rigidity in upper extremities and difficulty eating.   Objective:  Vital signs in last 24 hours: Vitals:   03/03/19 1943 03/03/19 2343 03/04/19 0328 03/04/19 0336  BP: 122/81 123/80 120/68   Pulse: 94 97 (!) 101   Resp: 18 17 18    Temp: 99.6 F (37.6 C) 99.1 F (37.3 C) 99.1 F (37.3 C)   TempSrc: Oral Axillary Oral   SpO2: 100% 100% 100%   Weight:    64.4 kg   General: awake, alert, sitting up in bed in NAD CV: RRR Pulm: normal work of breathing; CTAB Neuro: persistent stuttered speech. Bradykinesia and rigidity still present; tremors improved   Assessment/Plan:  Active Problems:   Bipolar 1 disorder (HCC)   Extrapyramidal and movement disorder   Extrapyramidal symptom  38 yo male with bipolar I disorder admitted to the internal medicine teaching service for EPS management. Likely 2/2 home medication interaction--haldol +abilify.Psych consulted on admission for recommendations.   Extrapyramidal symptoms secondary to antipsychotics - appreciate psych recommendations - aripiprazole has been discontinued; continue newly reduced haldol at 10 mg qhs - Benadryl 50 mg TID; Clonazepam 0.5 mg TID  - will increase Cogentin to 1 mg TID at discharge - patient still having significant rigidity; will need another day of inpatient management   Elevated CK  - in setting of above - improving with IVF  - renal function stable  Hypokalemia - will replete with PO supplementation    Dispo: Anticipated discharge in approximately 1 day(s).   Modena Nunnery D, DO 03/04/2019, 7:02 AM Pager: [phone]

## 2019-03-05 ENCOUNTER — Inpatient Hospital Stay (HOSPITAL_COMMUNITY): Payer: Medicaid Other

## 2019-03-05 DIAGNOSIS — G2570 Drug induced movement disorder, unspecified: Secondary | ICD-10-CM

## 2019-03-05 DIAGNOSIS — R110 Nausea: Secondary | ICD-10-CM

## 2019-03-05 DIAGNOSIS — R509 Fever, unspecified: Secondary | ICD-10-CM

## 2019-03-05 LAB — CBC
HCT: 33 % — ABNORMAL LOW (ref 39.0–52.0)
Hemoglobin: 10.7 g/dL — ABNORMAL LOW (ref 13.0–17.0)
MCH: 26.7 pg (ref 26.0–34.0)
MCHC: 32.4 g/dL (ref 30.0–36.0)
MCV: 82.3 fL (ref 80.0–100.0)
Platelets: 235 10*3/uL (ref 150–400)
RBC: 4.01 MIL/uL — ABNORMAL LOW (ref 4.22–5.81)
RDW: 14.8 % (ref 11.5–15.5)
WBC: 5.1 10*3/uL (ref 4.0–10.5)
nRBC: 0 % (ref 0.0–0.2)

## 2019-03-05 LAB — BASIC METABOLIC PANEL
Anion gap: 6 (ref 5–15)
BUN: <5 mg/dL — ABNORMAL LOW (ref 6–20)
CO2: 23 mmol/L (ref 22–32)
Calcium: 7.7 mg/dL — ABNORMAL LOW (ref 8.9–10.3)
Chloride: 110 mmol/L (ref 98–111)
Creatinine, Ser: 0.84 mg/dL (ref 0.61–1.24)
GFR calc Af Amer: >60 mL/min (ref >60)
GFR calc non Af Amer: >60 mL/min (ref >60)
Glucose, Bld: 93 mg/dL (ref 70–99)
Potassium: 3.8 mmol/L (ref 3.5–5.1)
Sodium: 139 mmol/L (ref 135–145)

## 2019-03-05 MED ORDER — ONDANSETRON HCL 4 MG/2ML IJ SOLN
4.0000 mg | Freq: Once | INTRAMUSCULAR | Status: AC
  Administered 2019-03-05: 4 mg via INTRAVENOUS
  Filled 2019-03-05: qty 2

## 2019-03-05 MED ORDER — ENSURE ENLIVE PO LIQD
237.0000 mL | Freq: Two times a day (BID) | ORAL | Status: DC
  Administered 2019-03-05 – 2019-03-06 (×3): 237 mL via ORAL
  Filled 2019-03-05 (×2): qty 237

## 2019-03-05 MED ORDER — ADULT MULTIVITAMIN W/MINERALS CH
1.0000 | ORAL_TABLET | Freq: Every day | ORAL | Status: DC
  Administered 2019-03-05 – 2019-03-07 (×3): 1 via ORAL
  Filled 2019-03-05 (×3): qty 1

## 2019-03-05 NOTE — Progress Notes (Signed)
SLP Cancellation Note  Patient Details Name: Carl Carey MRN: [id number] DOB: 1981/04/27   Cancelled treatment:       Reason Eval/Treat Not Completed: Medical issues which prohibited therapy; RN reported that pt had significant emesis following lunch.  SLP will continue to f/u as schedule allows.    Colin Mulders., M.S., [facility] Office: [phone]   [place] 03/05/2019, 12:40 PM

## 2019-03-05 NOTE — Progress Notes (Signed)
   NAME:  Carl Carey, MRN:  [id number], DOB:  1980-10-25, LOS: 2 ADMISSION DATE:  03/02/2019, Primary: System, Pcp Not In  CHIEF COMPLAINT:  shaking  Medical Service: Internal Medicine Teaching Service         Attending Physician: Dr. Lucious Groves, DO    First Contact: Dr. Darrick Meigs Pager: [phone]  Second Contact: Dr. Koleen Distance Pager: [phone]       After Hours (After 5p/  First Contact Pager: [phone]  weekends / holidays): Second Contact Pager: [phone]    Brief History  38 year old male with bipolar 1 disorder who presents the ED with EPS.  Thought to be secondary to issues with home medications that include Haldol and Abilify.  1 prior similar episode in the past.  Psychiatry is consulted and patient was admitted under the internal medicine teaching service for overnight observation and management of EPS with associated rhabdomyolysis  Subjective  Fever yesterday--Tmax 101.7. no further fevers since then.  Patient notes feeling better today. Complains of facial numbness throughout.   Objective   Blood pressure 103/65, pulse (!) 106, temperature 98.6 F (37 C), temperature source Oral, resp. rate 16, weight 65.2 kg, SpO2 100 %.     Examination: General: in no acute distress. Appears improved from Saturday HEENT: dry mucous membranes.  Cardiac: heart RRR Pulm: lung sounds clear Abd: soft, nontender Neuro: alert and oriented.  EOMs intact. Mask like facies. No facial droop. only mildly tremulous now. Rigidity improved but still present. Strength 5/5 in bilateral upper and lower extremities.   Consults:  Psych  Significant Diagnostic Tests:  10/30 CT head: no acute changes 10/30 EKG: minimal ST elevation in inferior leads 10/31 EKG: sinus rhythm  Labs   CK 1165-->986-->447 Troponins on admission: 8-->7 CBC Latest Ref Rng & Units 03/05/2019 03/03/2019 03/02/2019  WBC 4.0 - 10.5 K/uL 5.1 5.5 7.7  Hemoglobin 13.0 - 17.0 g/dL 10.7(L) 11.8(L) 14.2  Hematocrit 39.0 -  52.0 % 33.0(L) 36.3(L) 43.0  Platelets 150 - 400 K/uL 235 267 303    Summary  38 yo male with bipolar I disorder admitted to the internal medicine teaching service for EPS management. Likely 2/2 home medication interaction--haldol +abilify.Psych consulted on admission for recommendations.  Assessment & Plan:  Active Problems:   Bipolar 1 disorder (HCC)   Extrapyramidal and movement disorder   Extrapyramidal symptom  Bipolar I disorder EPS 2/2 home medication interaction. On haldol, congentin, abilify, celexa, and trazadone at home. Significant improvement from Saturday. Rhabdomyolysis secondary to EPS.  Improving. Renal function stable. Plan Continue Benadryl 50 mg TID; Clonazepam 3 TID Haldol to 10 mg at bedtime  Increase Cogentin to 1 mg 3 times daily. D/c abilify Continue IV fluids PT/OT  Nausea. Started this morning. Given his EPS, I discussed possible treatments with pharmacy. Recommended zofran. Will give one time dose and go from there.   Fever. Tmax 101.2 yesterday. No further fevers. no leukocytosis. Possibility for aspiration pneumonia given dysphagia. Repeat CXR this morning unchanged from admission. Plan. No further management. Will continue to monitor fever curve  Best practice:  CODE STATUS: Full Diet: Dysphagia 2 diet DVT for prophylaxis: Lovenox Dispo: likely d/c in next [place], MD INTERNAL MEDICINE RESIDENT PGY-1 PAGER #: [phone] 03/05/19 9:58 AM

## 2019-03-05 NOTE — Progress Notes (Signed)
Initial Nutrition Assessment  DOCUMENTATION CODES:   Not applicable  INTERVENTION:  - will order Ensure Enlive BID, each supplement provides 350 kcal and 20 grams of protein. - will order Magic Cup with lunch meals, each supplement provides 290 kcal and 9 grams of protein. - will order daily multivitamin with minerals. - continue to encourage PO intakes. - will attempt NFPE at follow-up.    NUTRITION DIAGNOSIS:   Increased nutrient needs related to acute illness as evidenced by estimated needs.  GOAL:   Patient will meet greater than or equal to 90% of their needs  MONITOR:   PO intake, Supplement acceptance, Labs, Weight trends  REASON FOR ASSESSMENT:   Malnutrition Screening Tool  ASSESSMENT:   38 year old male who has medical history of bipolar 1 disorder who presented the ED with EPS thought to be 2/2 issues with home medication including Haldol and abilify. Psychiatry consulted and patient admitted for management of EPS with associated of rhabdomyolysis.  Per flow sheet documentation patient consumed 100% of breakfast and lunch on 10/31 (total of 797 kcal, 29 grams protein); 100% of breakfast this AM (664 kcal, 28 grams protein).  Patient sleeping soundly at the time of RD visit; did not want to wake patient. Able to speak with nurse in the hallway. She reports that patient ate very well for breakfast this AM and has been eating well for previous meals. She saw patient during breakfast meal and did not notice any difficulties with chewing or swallowing.   Per chart review, current weight is 144 lb and weight has been fairly stable over the past ~3 years.   Per chart review, patient with hx of bipolar 1 disorder with EPS 2/2 home medication interaction, nausea this AM with 1 time dose of zofran ordered, no fevers since 11/1. MD note from this AM states patient likely to d/c in the next 1-2 days.    Labs reviewed; BUN: <5 mg/dl, Ca: 7.7 mg/dl. Medications reviewed; 40  mg oral protonix BID, 30 mEq Klor-Con x2 doses 11/1. IVF; NS @ 200 ml/hr.    NUTRITION - FOCUSED PHYSICAL EXAM:  unable to complete at this time.   Diet Order:   Diet Order            DIET DYS 2 Room service appropriate? Yes; Fluid consistency: Thin  Diet effective now              EDUCATION NEEDS:   No education needs have been identified at this time  Skin:  Skin Assessment: Reviewed RN Assessment  Last BM:  11/1  Height:   Ht Readings from Last 1 Encounters:  02/26/19 5' 8"  (1.727 m)    Weight:   Wt Readings from Last 1 Encounters:  03/05/19 65.2 kg    Ideal Body Weight:  70 kg  BMI:  Body mass index is 21.86 kg/m.  Estimated Nutritional Needs:   Kcal:  1955-2150 kcal  Protein:  100-115 grams  Fluid:  > 2 L/day      Jarome Matin, MS, RD, LDN, [facility] Inpatient Clinical Dietitian Pager # [phone] After hours/weekend pager # [phone]

## 2019-03-05 NOTE — Progress Notes (Signed)
Patient vomitted violently at lunch with scheduled !V zofran given earlier.  Had worsening diarrhea today.  Will continue to monitor

## 2019-03-05 NOTE — Evaluation (Signed)
Physical Therapy Evaluation Patient Details Name: Carl Carey MRN: [id number] DOB: [birth year] Today's Date: 03/05/2019   History of Present Illness  38 year old male with history of bipolar disorder presented with shaking and stiffness. Patient has PMH to include: DM, parkinsonism, panic attacks, malnutrition.  Clinical Impression  Patient received in bed, reports he needs to go to the bathroom. Patient is mod independent with supine to sit. Supervision for safety. Transfers independently with supervision. He ambulated 25 feet and used bathroom without AD and supervision/min guard. Good balance, no unsteadiness noted. Patient will benefit from continued skilled PT while here to improve mobility and safety.      Follow Up Recommendations No PT follow up    Equipment Recommendations  None recommended by PT    Recommendations for Other Services       Precautions / Restrictions Precautions Precautions: Fall Restrictions Weight Bearing Restrictions: No      Mobility  Bed Mobility Overal bed mobility: Needs Assistance Bed Mobility: Supine to Sit     Supine to sit: Supervision        Transfers Overall transfer level: Needs assistance Equipment used: None Transfers: Sit to/from Stand Sit to Stand: Supervision            Ambulation/Gait Ambulation/Gait assistance: Supervision Gait Distance (Feet): 25 Feet Assistive device: None Gait Pattern/deviations: Step-through pattern;Narrow base of support Gait velocity: decreased   General Gait Details: patient fairly steady with mobility. Supervision for safety and fall prevention.  Stairs            Wheelchair Mobility    Modified Rankin (Stroke Patients Only)       Balance Overall balance assessment: Mild deficits observed, not formally tested                                           Pertinent Vitals/Pain Pain Assessment: No/denies pain    Home Living Family/patient expects to be  discharged to:: Private residence Living Arrangements: Parent Available Help at Discharge: Family Type of Home: House Home Access: Stairs to enter   Technical brewer of Steps: 2 Home Layout: One level   Additional Comments: states he lives with his mother but she does not usually help him with anything.    Prior Function Level of Independence: Independent               Hand Dominance        Extremity/Trunk Assessment   Upper Extremity Assessment Upper Extremity Assessment: Overall WFL for tasks assessed    Lower Extremity Assessment Lower Extremity Assessment: Overall WFL for tasks assessed    Cervical / Trunk Assessment Cervical / Trunk Assessment: Normal  Communication   Communication: Expressive difficulties  Cognition Arousal/Alertness: Awake/alert Behavior During Therapy: WFL for tasks assessed/performed Overall Cognitive Status: Within Functional Limits for tasks assessed                                        General Comments      Exercises     Assessment/Plan    PT Assessment Patient needs continued PT services  PT Problem List Decreased strength;Decreased mobility;Decreased activity tolerance;Decreased balance       PT Treatment Interventions Therapeutic activities;Gait training;Therapeutic exercise;Stair training;Functional mobility training;Balance training;Patient/family education    PT Goals (Current goals can be found  in the Care Plan section)  Acute Rehab PT Goals Patient Stated Goal: return home PT Goal Formulation: With patient Time For Goal Achievement: 04/09/19 Potential to Achieve Goals: Good    Frequency Min 2X/week   Barriers to discharge        Co-evaluation               AM-PAC PT "6 Clicks" Mobility  Outcome Measure Help needed turning from your back to your side while in a flat bed without using bedrails?: None Help needed moving from lying on your back to sitting on the side of a flat  bed without using bedrails?: A Little Help needed moving to and from a bed to a chair (including a wheelchair)?: A Little Help needed standing up from a chair using your arms (e.g., wheelchair or bedside chair)?: A Little Help needed to walk in hospital room?: A Little Help needed climbing 3-5 steps with a railing? : A Little 6 Click Score: 19    End of Session Equipment Utilized During Treatment: Gait belt Activity Tolerance: Patient tolerated treatment well Patient left: in chair;with chair alarm set;with call bell/phone within reach Nurse Communication: Mobility status PT Visit Diagnosis: Muscle weakness (generalized) (M62.81);Unsteadiness on feet (R26.81)    Time: 2575-0518 PT Time Calculation (min) (ACUTE ONLY): 21 min   Charges:   PT Evaluation $PT Eval Moderate Complexity: 1 Mod          [place] Fassnacht, PT, GCS 03/05/19,11:58 AM

## 2019-03-06 DIAGNOSIS — R112 Nausea with vomiting, unspecified: Secondary | ICD-10-CM | POA: Diagnosis not present

## 2019-03-06 MED ORDER — DIPHENHYDRAMINE HCL 50 MG/ML IJ SOLN
50.0000 mg | Freq: Once | INTRAMUSCULAR | Status: AC
  Administered 2019-03-06: 10:00:00 50 mg via INTRAVENOUS
  Filled 2019-03-06: qty 1

## 2019-03-06 MED ORDER — CLONAZEPAM 0.5 MG PO TABS
0.5000 mg | ORAL_TABLET | Freq: Two times a day (BID) | ORAL | Status: DC | PRN
  Administered 2019-03-06: 0.5 mg via ORAL
  Filled 2019-03-06: qty 1

## 2019-03-06 MED ORDER — HALOPERIDOL 5 MG PO TABS
10.0000 mg | ORAL_TABLET | Freq: Every day | ORAL | Status: DC
  Administered 2019-03-06: 10 mg via ORAL
  Filled 2019-03-06 (×3): qty 2

## 2019-03-06 MED ORDER — BENZTROPINE MESYLATE 0.5 MG PO TABS
1.0000 mg | ORAL_TABLET | Freq: Three times a day (TID) | ORAL | Status: DC
  Administered 2019-03-06 – 2019-03-07 (×4): 1 mg via ORAL
  Filled 2019-03-06 (×4): qty 2

## 2019-03-06 NOTE — Progress Notes (Signed)
  Speech Language Pathology Treatment: Dysphagia  Patient Details Name: Carl Carey MRN: [id number] DOB: [birth year] Today's Date: 03/06/2019 Time: 1950-9326 SLP Time Calculation (min) (ACUTE ONLY): 20 min  Assessment / Plan / Recommendation Clinical Impression  Pt seen at bedside for assessment of diet tolerance. Pt had completed breakfast (dys2/thin), and reported no difficulty with this consistency. Pt was observed with trials of thin liquid and graham cracker, and did not exhibit significant issues with this texture. He does report continued difficulty with solids including meats and other items requiring significant mastication. Will continue current diet (dys 2) for now, however, pt will likely be able to advance with continued improvement. Lungs are clear and pt is afebrile per chart. Pt reporting 8/10 facial pain - RN informed.     HPI HPI: Carl Carey is a 38 y.o. year-old male with a history of bipolar disorder, diabetes, panic attacks presenting to the ED with chief complaint of tachycardia. Patient is a challenging historian due to psychiatric illness and stutter.  Explains that he was in the emergency department a few days ago but his dental pain is improved and his diarrhea is resolved.      SLP Plan  Continue with current plan of care       Recommendations  Diet recommendations: Dysphagia 2 (fine chop);Thin liquid Liquids provided via: Straw;Cup Medication Administration: Whole meds with puree Supervision: Patient able to self feed Compensations: Slow rate;Small sips/bites Postural Changes and/or Swallow Maneuvers: Seated upright 90 degrees                Oral Care Recommendations: Oral care BID Follow up Recommendations: 24 hour supervision/assistance SLP Visit Diagnosis: Dysphagia, unspecified (R13.10) Plan: Continue with current plan of care       [place], [place], [place] Pathologist Office: [phone] Pager:  [phone]  Shonna Chock 03/06/2019, 9:20 AM

## 2019-03-06 NOTE — Progress Notes (Signed)
   NAME:  Markon Jares, MRN:  [id number], DOB:  1981/03/31, LOS: 3 ADMISSION DATE:  03/02/2019, Primary: System, Pcp Not In  CHIEF COMPLAINT:  shaking  Medical Service: Internal Medicine Teaching Service         Attending Physician: Dr. Lucious Groves, DO    First Contact: Dr. Darrick Meigs Pager: [phone]  Second Contact: Dr. Koleen Distance Pager: [phone]       After Hours (After 5p/  First Contact Pager: [phone]  weekends / holidays): Second Contact Pager: [phone]    Brief History  38 year old male with bipolar 1 disorder who presents the ED with EPS.  Thought to be secondary to issues with home medications that include Haldol and Abilify.  1 prior similar episode in the past.  Psychiatry is consulted and patient was admitted under the internal medicine teaching service for overnight observation and management of EPS with associated rhabdomyolysis  Subjective  No overnight events. Still complains of nausea with mild abd pain.  Objective   Blood pressure 126/76, pulse 80, temperature 98.4 F (36.9 C), temperature source Oral, resp. rate 18, weight 65.2 kg, SpO2 99 %.     Examination: General: No acute distress Cardiac: Regular rate and rhythm Pulmonary: Clear to auscultation Abdomen: Bowel sounds active.  Mildly tender on palpation throughout Neuro: Masklike facies.  Rigidity resolved.  Mild tremor.  Alert and oriented  Consults:  Psych  Significant Diagnostic Tests:  10/30 CT head: no acute changes 10/30 EKG: minimal ST elevation in inferior leads 10/31 EKG: sinus rhythm  Labs   CK 1165-->986-->447   Summary  38 yo male with bipolar I disorder admitted to the internal medicine teaching service for EPS management. Likely 2/2 home medication interaction--haldol +abilify.Psych consulted on admission for recommendations.  Assessment & Plan:  Active Problems:   Bipolar 1 disorder (HCC)   Extrapyramidal and movement disorder   Extrapyramidal symptom  Bipolar I disorder  EPS 2/2 home medication interaction. On haldol, congentin, abilify, celexa, and trazadone at home.  Rigidity and bradykinesia appeared resolved. Rhabdomyolysis secondary to EPS.  Improving. Plan Decrease Benadryl to 50 mg once daily; Clonazepam 3 TID; restart Cogentin to 1 mg 3 times daily.  Decrease IVF to 142m/hr   Nausea. Started hospital day #2. Possibly 2/2 benadryl. Pharmacy looking into other antiemetics since zofran did not help much. Will continue to monitor  Best practice:  CODE STATUS: Full Diet: Dysphagia 2 diet DVT for prophylaxis: Lovenox Dispo: likely d/c in next [place] MD INTERNAL MEDICINE RESIDENT PGY-1 PAGER #: [phone]/03/20 5:43 AM

## 2019-03-06 NOTE — TOC Initial Note (Signed)
Transition of Care [facility]) - Initial/Assessment Note    Patient Details  Name: Carl Carey MRN: [id number] Date of Birth: May 07, 1980  [facility]) CM/SW Contact:    Pollie Friar, RN Phone Number: 03/06/2019, 2:16 PM  Clinical Narrative:                 Received phone call from patients CM through his medicaid. They have been trying to get him set up with an ACT team for his mental health needs. The CM states he goes between being homeless and living with his mother.  PCP: [place] CM was able to speak with [facility] team and they will see hm in the hospital on Thursday, if still admitted.  TOC following for d/c needs.   Expected Discharge Plan: Home/Self Care Barriers to Discharge: Continued Medical Work up   Patient Goals and CMS Choice   CMS Medicare.gov Compare Post Acute Care list provided to:: Patient Choice offered to / list presented to : (sister in law)  Expected Discharge Plan and Services Expected Discharge Plan: Home/Self Care In-house Referral: Clinical Social Work Discharge Planning Services: CM Consult Post Acute Care Choice: [place] arrangements for the past 2 months: Single Family Home                                      Prior Living Arrangements/Services Living arrangements for the past 2 months: Single Family Home Lives with:: Parents Patient language and need for interpreter reviewed:: Yes Do you feel safe going back to the place where you live?: Yes      Need for Family Participation in Patient Care: No (Comment) Care giver support system in place?: No (comment) Current home services: ([place] with [facility]) Criminal Activity/Legal Involvement Pertinent to Current Situation/Hospitalization: No - Comment as needed  Activities of Daily Living      Permission Sought/Granted                  Emotional Assessment Appearance:: Appears stated age Attitude/Demeanor/Rapport: Engaged Affect (typically  observed): Accepting Orientation: : Oriented to Self, Oriented to Place, Oriented to Situation   Psych Involvement: Yes (comment)  Admission diagnosis:  Akathisia [G25.71] Extrapyramidal movements present [G25.9] Patient Active Problem List   Diagnosis Date Noted  . Nausea with vomiting 03/06/2019  . Extrapyramidal symptom 03/03/2019  . Extrapyramidal and movement disorder 03/02/2019  . Bipolar 1 disorder ([place]) 11/09/2018  . Parkinsonian features 07/21/2014  . Dysphagia 07/21/2014   PCP:  System, Pcp Not In Pharmacy:   [place], [address] [place] [address] Phone: [phone] Fax: [phone]     Social Determinants of Health (SDOH) Interventions    Readmission Risk Interventions No flowsheet data found.

## 2019-03-06 NOTE — TOC Initial Note (Deleted)
Transition of Care [facility]) - Initial/Assessment Note    Patient Details  Name: Carl Carey MRN: [id number] Date of Birth: Jun 15, 1980  Transition of Care [facility]) CM/SW Contact:    Pollie Friar, RN Phone Number: 03/06/2019, 2:12 PM  Clinical Narrative:                 Recommendations are for [facility] with 24 hour supervision. CM spoke to the patient and she stated it was ok to reach out to her sister in law. CM called and went over the recommendations. Sister in law feels she will be fine at home with them checking in on her as they do currently.  Pt was active with [facility] for [place] and that would continue after d/c.  Sister in law is concerned about her being d/ced on lovenox.  MD updated on all of the above.  TOC following for d/c needs.   Expected Discharge Plan: [place] Barriers to Discharge: Continued Medical Work up   Patient Goals and CMS Choice   CMS Medicare.gov Compare Post Acute Care list provided to:: Patient Represenative (must comment) Choice offered to / list presented to : (sister in law)  Expected Discharge Plan and Services Expected Discharge Plan: [place]   Discharge Planning Services: CM Consult Post Acute Care Choice: [place] arrangements for the past 2 months: Apartment                                      Prior Living Arrangements/Services Living arrangements for the past 2 months: Apartment Lives with:: Self Patient language and need for interpreter reviewed:: Yes(no needs) Do you feel safe going back to the place where you live?: Yes      Need for Family Participation in Patient Care: Yes (Comment)(24 hour supervision recommended) Care giver support system in place?: No (comment)(family checks on her regularly but no 24 hour supervision) Current home services: ([place] with [facility]) Criminal Activity/Legal Involvement Pertinent to Current Situation/Hospitalization: No - Comment as needed  Activities of Daily  Living      Permission Sought/Granted                  Emotional Assessment Appearance:: Appears stated age Attitude/Demeanor/Rapport: Engaged Affect (typically observed): Accepting Orientation: : Oriented to Self, Oriented to Place, Oriented to Situation   Psych Involvement: No (comment)  Admission diagnosis:  Akathisia [G25.71] Extrapyramidal movements present [G25.9] Patient Active Problem List   Diagnosis Date Noted  . Nausea with vomiting 03/06/2019  . Extrapyramidal symptom 03/03/2019  . Extrapyramidal and movement disorder 03/02/2019  . Bipolar 1 disorder ([place]) 11/09/2018  . Parkinsonian features 07/21/2014  . Dysphagia 07/21/2014   PCP:  System, Pcp Not In Pharmacy:   [place], [address] [place] [address] Phone: [phone] Fax: [phone]     Social Determinants of Health (SDOH) Interventions    Readmission Risk Interventions No flowsheet data found.

## 2019-03-06 NOTE — Progress Notes (Signed)
Occupational Therapy Evaluation Patient Details Name: Carl Carey MRN: [id number] DOB: Jan 05, 1981 Today's Date: 03/06/2019    History of Present Illness 38 year old male with history of bipolar disorder presented with shaking and stiffness. Patient has PMH to include: DM, parkinsonism, panic attacks, malnutrition.   Clinical Impression   PTA, pt lived with mother and was independent for ADLs, reports that he does not drive. Pt currently presents with decreased activity tolerance, balance, problem solving, and increased pain. Pt required min guard A for toileting, and supervision with VCs to problem solve through oral care and hand hygiene at the sink. Pt would benefit from continued OT acutely to address deficits and facilitate safe dc. Recommend dc home with HHOT to increase safe performance of ADLs.      Follow Up Recommendations  Supervision/Assistance - 24 hour;Home health OT    Equipment Recommendations  3 in 1 bedside commode    Recommendations for Other Services PT consult     Precautions / Restrictions Precautions Precautions: Fall Restrictions Weight Bearing Restrictions: No      Mobility Bed Mobility Overal bed mobility: Modified Independent             General bed mobility comments: Pt performed bed mobility at mod I level  Transfers Overall transfer level: Needs assistance Equipment used: None Transfers: Sit to/from Stand Sit to Stand: Supervision         General transfer comment: supervision for safety    Balance Overall balance assessment: Mild deficits observed, not formally tested                                         ADL either performed or assessed with clinical judgement   ADL Overall ADL's : Needs assistance/impaired Eating/Feeding: Independent;Sitting   Grooming: Oral care;Wash/dry hands;Min guard;Cueing for sequencing;Standing Grooming Details (indicate cue type and reason): Pt performed hand hygiene and oral care  with min guard A at sink, requiring mod VCs to sequence and problem solve through oral care task Upper Body Bathing: Supervision/ safety;Sitting   Lower Body Bathing: Min guard;Sit to/from stand   Upper Body Dressing : Supervision/safety;Sitting   Lower Body Dressing: Min guard;Sit to/from stand   Toilet Transfer: Min guard;Ambulation;Regular Glass blower/designer Details (indicate cue type and reason): Pt required min guard A for balance and safety Toileting- Clothing Manipulation and Hygiene: Supervision/safety;Sit to/from stand       Functional mobility during ADLs: Min guard General ADL Comments: Pt required supervision for UB ADLs, min guard A for LB ADLs and functional mobility. Pt experienced 1 episode LOB walking back to bed, but was able to self correct     Vision         Perception     Praxis      Pertinent Vitals/Pain Pain Assessment: Faces Faces Pain Scale: Hurts little more Pain Location: stomach Pain Descriptors / Indicators: Aching;Cramping;Discomfort;Grimacing Pain Intervention(s): Monitored during session;Repositioned     Hand Dominance Right   Extremity/Trunk Assessment Upper Extremity Assessment Upper Extremity Assessment: Generalized weakness   Lower Extremity Assessment Lower Extremity Assessment: Defer to PT evaluation   Cervical / Trunk Assessment Cervical / Trunk Assessment: Normal   Communication Communication Communication: Expressive difficulties   Cognition Arousal/Alertness: Awake/alert Behavior During Therapy: WFL for tasks assessed/performed Overall Cognitive Status: No family/caregiver present to determine baseline cognitive functioning  General Comments: Pt perseverating on topic of medications throughout session, also demonstrated inconsistent use of words including "acid reflux of his hands". At the sink during oral care, pt asked where he should put his toothbrush, and required mod  VCs to rinse toothpaste out of mouth.    General Comments       Exercises     Shoulder Instructions      Home Living Family/patient expects to be discharged to:: Private residence Living Arrangements: Parent Available Help at Discharge: Family;Available 24 hours/day Type of Home: Apartment Home Access: Level entry     Home Layout: One level     Bathroom Shower/Tub: Teacher, early years/pre: Standard     Home Equipment: None   Additional Comments: states he lives with his mother but she does not usually help him with anything.      Prior Functioning/Environment Level of Independence: Independent        Comments: Independent with ADLs, reports he does not drive        OT Problem List: Decreased strength;Decreased range of motion;Decreased activity tolerance;Impaired balance (sitting and/or standing);Decreased coordination;Decreased safety awareness;Decreased knowledge of use of DME or AE;Pain      OT Treatment/Interventions: Self-care/ADL training;DME and/or AE instruction;Therapeutic activities;Patient/family education;Balance training    OT Goals(Current goals can be found in the care plan section) Acute Rehab OT Goals Patient Stated Goal: return home OT Goal Formulation: With patient Time For Goal Achievement: 03/20/19 Potential to Achieve Goals: Good  OT Frequency: Min 3X/week   Barriers to D/C:            Co-evaluation              AM-PAC OT "6 Clicks" Daily Activity     Outcome Measure Help from another person eating meals?: None Help from another person taking care of personal grooming?: A Little Help from another person toileting, which includes using toliet, bedpan, or urinal?: A Little Help from another person bathing (including washing, rinsing, drying)?: A Little Help from another person to put on and taking off regular upper body clothing?: None Help from another person to put on and taking off regular lower body clothing?: A  Lot 6 Click Score: 19   End of Session Nurse Communication: Mobility status  Activity Tolerance: Patient tolerated treatment well Patient left: in bed;with call bell/phone within reach;with bed alarm set  OT Visit Diagnosis: Unsteadiness on feet (R26.81);Other abnormalities of gait and mobility (R26.89);Muscle weakness (generalized) (M62.81);Other symptoms and signs involving the nervous system (R29.898);Pain Pain - part of body: (stomach)                Time: 8768-1157 OT Time Calculation (min): 22 min Charges:  OT General Charges $OT Visit: 1 Visit OT Evaluation $OT Eval Moderate Complexity: [place], OT Student  Gus Rankin 03/06/2019, 4:44 PM

## 2019-03-07 DIAGNOSIS — M6282 Rhabdomyolysis: Secondary | ICD-10-CM | POA: Diagnosis present

## 2019-03-07 DIAGNOSIS — K0889 Other specified disorders of teeth and supporting structures: Secondary | ICD-10-CM

## 2019-03-07 MED ORDER — LOPERAMIDE HCL 2 MG PO CAPS
2.0000 mg | ORAL_CAPSULE | Freq: Four times a day (QID) | ORAL | Status: DC | PRN
  Administered 2019-03-07: 2 mg via ORAL
  Filled 2019-03-07: qty 1

## 2019-03-07 MED ORDER — BENZTROPINE MESYLATE 1 MG PO TABS: 1.0000 mg | ORAL_TABLET | Freq: Three times a day (TID) | ORAL | 0 refills | Status: DC

## 2019-03-07 MED ORDER — HALOPERIDOL 10 MG PO TABS: 10.0000 mg | ORAL_TABLET | Freq: Every day | ORAL | 0 refills | Status: DC

## 2019-03-07 MED FILL — HALOPERIDOL 10 MG TABLET: 10 | 30 days supply | Qty: 30 | Fill #0

## 2019-03-07 NOTE — Progress Notes (Signed)
Physical Therapy Treatment Patient Details Name: Carl Carey MRN: [id number] DOB: 1980-08-12 Today's Date: 03/07/2019    History of Present Illness 38 year old male with history of bipolar disorder presented with shaking and stiffness. Patient has PMH to include: DM, parkinsonism, panic attacks, malnutrition.    PT Comments    Patient received in bed. Initially refusing, but then agrees to walk in hall. Patient performs bed mobility, transfers and ambulation with supervision. Ambulated 200 feet. No AD. No LOB. Patient appears to be at baseline level of mobility.  Patient can walk with staff ok when desired. PT to sign off at this time.       Follow Up Recommendations  No PT follow up     Equipment Recommendations  None recommended by PT    Recommendations for Other Services       Precautions / Restrictions Precautions Precautions: Fall Restrictions Weight Bearing Restrictions: No    Mobility  Bed Mobility Overal bed mobility: Modified Independent Bed Mobility: Supine to Sit;Sit to Supine     Supine to sit: Supervision Sit to supine: Supervision   General bed mobility comments: Pt performed bed mobility at mod I level  Transfers Overall transfer level: Modified independent Equipment used: None Transfers: Sit to/from Stand Sit to Stand: Supervision         General transfer comment: supervision for safety  Ambulation/Gait Ambulation/Gait assistance: Supervision Gait Distance (Feet): 200 Feet Assistive device: None   Gait velocity: WNL   General Gait Details: patient fairly steady with mobility. Supervision for safety and fall prevention.   Stairs             Wheelchair Mobility    Modified Rankin (Stroke Patients Only)       Balance Overall balance assessment: Modified Independent                                          Cognition Arousal/Alertness: Awake/alert Behavior During Therapy: WFL for tasks  assessed/performed Overall Cognitive Status: No family/caregiver present to determine baseline cognitive functioning                                        Exercises      General Comments        Pertinent Vitals/Pain Pain Assessment: No/denies pain    Home Living                      Prior Function            PT Goals (current goals can now be found in the care plan section) Acute Rehab PT Goals Patient Stated Goal: return home PT Goal Formulation: With patient Time For Goal Achievement: 03/21/19 Potential to Achieve Goals: Good Progress towards PT goals: Progressing toward goals    Frequency    Min 2X/week      PT Plan Current plan remains appropriate    Co-evaluation              AM-PAC PT "6 Clicks" Mobility   Outcome Measure  Help needed turning from your back to your side while in a flat bed without using bedrails?: None Help needed moving from lying on your back to sitting on the side of a flat bed without using bedrails?: None Help needed moving to and  from a bed to a chair (including a wheelchair)?: A Little Help needed standing up from a chair using your arms (e.g., wheelchair or bedside chair)?: A Little Help needed to walk in hospital room?: A Little Help needed climbing 3-5 steps with a railing? : A Little 6 Click Score: 20    End of Session Equipment Utilized During Treatment: Gait belt Activity Tolerance: Patient tolerated treatment well Patient left: in bed;with bed alarm set;with call bell/phone within reach Nurse Communication: Mobility status PT Visit Diagnosis: Muscle weakness (generalized) (M62.81);Unsteadiness on feet (R26.81)     Time: 1300-1315 PT Time Calculation (min) (ACUTE ONLY): 15 min  Charges:  $Gait Training: 8-22 mins                     Amyriah Buras, PT, GCS 03/07/19,1:24 PM

## 2019-03-07 NOTE — Discharge Summary (Addendum)
Name: Carl Carey MRN: [id number] DOB: Sep 04, 1980 38 y.o. PCP: System, Pcp Not In  Date of Admission: 03/02/2019  7:45 AM Date of Discharge:  Attending Physician: Lucious Groves, DO  Discharge Diagnosis: 1. EPS secondary to medication interaction 2. Rhabdomyolysis  3. Toothache  Discharge Medications: Allergies as of 03/07/2019   No Known Allergies     Medication List    STOP taking these medications   ARIPiprazole 10 MG tablet Commonly known as: ABILIFY   CARBIDOPA-LEVODOPA PO   citalopram 20 MG tablet Commonly known as: CELEXA   predniSONE 10 MG tablet Commonly known as: DELTASONE   traZODone 100 MG tablet Commonly known as: DESYREL     TAKE these medications   benztropine 1 MG tablet Commonly known as: COGENTIN Take 1 tablet (1 mg total) by mouth 3 (three) times daily. What changed: when to take this Notes to patient: 03/07/19   haloperidol 10 MG tablet Commonly known as: HALDOL Take 1 tablet (10 mg total) by mouth daily. What changed:   medication strength  how much to take Notes to patient: 03/07/19   loperamide 2 MG capsule Commonly known as: IMODIUM Take 1 capsule (2 mg total) by mouth 4 (four) times daily as needed for diarrhea or loose stools. Max 16 mg (8 capsules) per day   pantoprazole 40 MG tablet Commonly known as: PROTONIX Take 1 tablet (40 mg total) by mouth 2 (two) times daily. Notes to patient: 03/07/19   potassium chloride SA 20 MEQ tablet Commonly known as: KLOR-CON Take 1 tablet (20 mEq total) by mouth daily. Notes to patient: 03/07/19            Durable Medical Equipment  (From admission, onward)         Start     Ordered   03/07/19 0954  DME 3-in-1  Once     03/07/19 0955          Disposition and follow-up:   [name] was discharged from [facility] in Stable condition.  At the hospital follow up visit please address:  EPS secondary to medication side effects.  He was on Abilify,  Haldol, trazodone, and possibly Celexa prior to admission.  During hospitalization, Abilify was discontinued, Haldol was decreased to 10 mg at night and Celexa was discontinued.  Patient was instructed to make these changes upon discharge.  Patient also had a toothache for which he he was instructed to follow-up with a dentist.  2.  Labs / imaging needed at time of follow-up: none  Follow-up Appointments: PCP in 3-5 days. Psychiatry within one week.   Hospital Course: This is a 38 year old male with bipolar 1 disorder who presented to the ED on 03/02/2019 for a 10-day history of progressive rigidity, tremor and dysphagia.  On admission, medication review revealed patient was taking Abilify, Haldol, trazodone, Cogentin, and possibly Celexa.  Psychiatry was consulted due to EPS symptoms for which they recommended discontinuing Abilify, decreasing Haldol to 10 mg from 15, and increasing Cogentin to 1 mg 3 times daily.  They also recommended Benadryl and clonazepam for symptoms.  Symptoms slowly improved throughout his hospitalization and Benadryl  was decreased..  Masklike facies had improved during his hospitalization but some degree of it was still present on discharge. On admission, he was also found to have rhabdomyolysis likely secondary to the rigidity and shaking.  CK on admission was 1165 which decreased fairly quickly to 447.  Renal function remained stable throughout his hospitalization.  Discharge  Vitals:   BP (!) 101/57 (BP Location: Left Arm)    Pulse 89    Temp 99.1 F (37.3 C) (Oral)    Resp 18    Wt 64.2 kg    SpO2 100%    BMI 21.52 kg/m   Pertinent Labs, Studies, and Procedures:  CK 1165-->986-->447  Discharge Instructions: Discharge Instructions    Discharge instructions   Complete by: As directed    Follow up with pcp within 5 days. Follow up with psychiatry within one week.      Signed: Mitzi Hansen, MD 03/07/2019, 11:18 AM   Pager: [phone]

## 2019-03-07 NOTE — TOC Transition Note (Signed)
Transition of Care [facility]) - CM/SW Discharge Note   Patient Details  Name: Carl Carey MRN: [id number] Date of Birth: 1980/12/31  [facility] CM/SW Contact:  Pollie Friar, RN Phone Number: 03/07/2019, 2:52 PM   Clinical Narrative:    Pt discharging to his mothers home. CM called his mother and she was in agreement with letting him stay with her. She did not have a way to pick him up. CM provided bedside RN cab voucher.  Pt with orders for [facility] services but CM could not find a [employer] agency to take the referral. MD was aware that may be a possibility. Pt had 3 in 1 at bedside for home but left the DME in the room. Pt to have ACT team meet him tomorrow at the hospital but pt d/ced. CM called ACT team and they will meet the patient at the mothers home. Security to take patient to [facility] for his medications then cab to his mothers home. Pt walked away from the ED and did not wait on the security guard to transport him to [facility].   Final next level of care: Home/Self Care Barriers to Discharge: Continued Medical Work up   Patient Goals and CMS Choice   CMS Medicare.gov Compare Post Acute Care list provided to:: Patient Choice offered to / list presented to : (sister in law)  Discharge Placement                       Discharge Plan and Services In-house Referral: Clinical Social Work Discharge Planning Services: CM Consult Post Acute Care Choice: Home Health          DME Arranged: 3-N-1 DME Agency: [employer] Date DME Agency Contacted: 03/07/19   Representative spoke with at DME Agency: zack            Social Determinants of Health ([place]) Interventions     Readmission Risk Interventions No flowsheet data found.

## 2019-03-07 NOTE — Progress Notes (Signed)
   NAME:  Jakell Trusty, MRN:  [id number], DOB:  10-22-1980, LOS: 4 ADMISSION DATE:  03/02/2019, Primary: System, Pcp Not In  CHIEF COMPLAINT:  shaking  Medical Service: Internal Medicine Teaching Service         Attending Physician: Dr. Lucious Groves, DO    First Contact: Dr. Darrick Meigs Pager: [phone]  Second Contact: Dr. Koleen Distance Pager: [phone]       After Hours (After 5p/  First Contact Pager: [phone]  weekends / holidays): Second Contact Pager: [phone]    Brief History  38 year old male with bipolar 1 disorder who presents the ED with EPS.  Thought to be secondary to issues with home medications that include Haldol and Abilify.  1 prior similar episode in the past.  Psychiatry is consulted and patient was admitted under the internal medicine teaching service for overnight observation and management of EPS with associated rhabdomyolysis  Subjective  No further episodes of vomiting reported  Objective   Blood pressure 108/65, pulse 90, temperature 99.4 F (37.4 C), temperature source Oral, resp. rate 19, weight 64.2 kg, SpO2 100 %.     Examination: General: No acute distress Cardiac: Regular rate and rhythm Pulmonary: Clear to auscultation Abdomen: Bowel sounds active.  Mildly tender on palpation throughout Neuro: Masklike facies resolving.  Rigidity resolved.  No tremor..  Alert and oriented  Consults:  Psych  Significant Diagnostic Tests:  10/30 CT head: no acute changes 10/30 EKG: minimal ST elevation in inferior leads 10/31 EKG: sinus rhythm  Labs   CK 1165-->986-->447   Summary  38 yo male with bipolar I disorder admitted to the internal medicine teaching service for EPS management. Likely 2/2 home medication interaction--haldol +abilify.Psych consulted on admission for recommendations.  Assessment & Plan:  Principal Problem:   Extrapyramidal symptom Active Problems:   Bipolar 1 disorder (HCC)   Extrapyramidal and movement disorder   Nausea with  vomiting  Bipolar I disorder EPS 2/2 home medication interaction. On haldol, congentin, abilify, celexa, and trazadone at home.  Rigidity and bradykinesia appeared resolved. Rhabdomyolysis secondary to EPS.  Improving. Plan Benadryl to 50 mg once daily; Clonazepam 3 TID; Cogentin to 1 mg 3 times daily.  Nausea. No further episodes.  Best practice:  CODE STATUS: Full Diet: Dysphagia 2 diet DVT for prophylaxis: Lovenox Dispo: likely d/c later today.   Mitzi Hansen, MD INTERNAL MEDICINE RESIDENT PGY-1 PAGER #: [phone] 03/07/19 5:31 AM

## 2019-03-08 ENCOUNTER — Telehealth: Payer: Self-pay | Admitting: Licensed Clinical Social Worker

## 2019-03-08 NOTE — Telephone Encounter (Signed)
Call placed to patient regarding IBH referral. LCSW left message for a return call.

## 2019-03-11 ENCOUNTER — Emergency Department (HOSPITAL_COMMUNITY)
Admission: EM | Admit: 2019-03-11 | Discharge: 2019-03-11 | Disposition: A | Discharge status: Home / Self Care | Payer: Medicaid Other | Source: Home / Self Care | Attending: Emergency Medicine | Admitting: Emergency Medicine

## 2019-03-11 ENCOUNTER — Emergency Department (HOSPITAL_COMMUNITY): Payer: Medicaid Other

## 2019-03-11 ENCOUNTER — Other Ambulatory Visit: Payer: Self-pay

## 2019-03-11 DIAGNOSIS — E876 Hypokalemia: Secondary | ICD-10-CM

## 2019-03-11 DIAGNOSIS — G20 Parkinson's disease: Secondary | ICD-10-CM | POA: Insufficient documentation

## 2019-03-11 DIAGNOSIS — F17210 Nicotine dependence, cigarettes, uncomplicated: Secondary | ICD-10-CM | POA: Insufficient documentation

## 2019-03-11 DIAGNOSIS — R569 Unspecified convulsions: Secondary | ICD-10-CM | POA: Insufficient documentation

## 2019-03-11 DIAGNOSIS — E119 Type 2 diabetes mellitus without complications: Secondary | ICD-10-CM | POA: Insufficient documentation

## 2019-03-11 DIAGNOSIS — Z79899 Other long term (current) drug therapy: Secondary | ICD-10-CM | POA: Insufficient documentation

## 2019-03-11 LAB — CBC WITH DIFFERENTIAL/PLATELET
Abs Immature Granulocytes: 0.1 10*3/uL — ABNORMAL HIGH (ref 0.00–0.07)
Basophils Absolute: 0.1 10*3/uL (ref 0.0–0.1)
Basophils Relative: 1 %
Eosinophils Absolute: 0.1 10*3/uL (ref 0.0–0.5)
Eosinophils Relative: 1 %
HCT: 41.2 % (ref 39.0–52.0)
Hemoglobin: 13.2 g/dL (ref 13.0–17.0)
Immature Granulocytes: 1 %
Lymphocytes Relative: 5 %
Lymphs Abs: 0.5 10*3/uL — ABNORMAL LOW (ref 0.7–4.0)
MCH: 26.2 pg (ref 26.0–34.0)
MCHC: 32 g/dL (ref 30.0–36.0)
MCV: 81.9 fL (ref 80.0–100.0)
Monocytes Absolute: 0.9 10*3/uL (ref 0.1–1.0)
Monocytes Relative: 8 %
Neutro Abs: 9.4 10*3/uL — ABNORMAL HIGH (ref 1.7–7.7)
Neutrophils Relative %: 84 %
Platelets: 599 10*3/uL — ABNORMAL HIGH (ref 150–400)
RBC: 5.03 MIL/uL (ref 4.22–5.81)
RDW: 14.4 % (ref 11.5–15.5)
WBC: 11 10*3/uL — ABNORMAL HIGH (ref 4.0–10.5)
nRBC: 0 % (ref 0.0–0.2)

## 2019-03-11 LAB — ETHANOL: Alcohol, Ethyl (B): <10 mg/dL (ref <10)

## 2019-03-11 LAB — URINALYSIS, ROUTINE W REFLEX MICROSCOPIC
Bilirubin Urine: NEGATIVE
Glucose, UA: NEGATIVE mg/dL
Ketones, ur: 15 mg/dL — AB
Leukocytes,Ua: NEGATIVE
Nitrite: NEGATIVE
Protein, ur: NEGATIVE mg/dL
Specific Gravity, Urine: >1.03 — ABNORMAL HIGH (ref 1.005–1.030)
pH: 6 (ref 5.0–8.0)

## 2019-03-11 LAB — BASIC METABOLIC PANEL
Anion gap: 12 (ref 5–15)
BUN: <5 mg/dL — ABNORMAL LOW (ref 6–20)
CO2: 22 mmol/L (ref 22–32)
Calcium: 9.1 mg/dL (ref 8.9–10.3)
Chloride: 102 mmol/L (ref 98–111)
Creatinine, Ser: 1.08 mg/dL (ref 0.61–1.24)
GFR calc Af Amer: >60 mL/min (ref >60)
GFR calc non Af Amer: >60 mL/min (ref >60)
Glucose, Bld: 124 mg/dL — ABNORMAL HIGH (ref 70–99)
Potassium: 3.2 mmol/L — ABNORMAL LOW (ref 3.5–5.1)
Sodium: 136 mmol/L (ref 135–145)

## 2019-03-11 LAB — URINALYSIS, MICROSCOPIC (REFLEX)
Bacteria, UA: NONE SEEN
RBC / HPF: NONE SEEN RBC/hpf (ref 0–5)
Squamous Epithelial / HPF: NONE SEEN (ref 0–5)

## 2019-03-11 LAB — CBG MONITORING, ED: Glucose-Capillary: 101 mg/dL — ABNORMAL HIGH (ref 70–99)

## 2019-03-11 LAB — CK: Total CK: 520 U/L — ABNORMAL HIGH (ref 49–397)

## 2019-03-11 LAB — RAPID URINE DRUG SCREEN, HOSP PERFORMED
Amphetamines: NOT DETECTED
Barbiturates: NOT DETECTED
Benzodiazepines: NOT DETECTED
Cocaine: NOT DETECTED
Opiates: NOT DETECTED
Tetrahydrocannabinol: NOT DETECTED

## 2019-03-11 LAB — LACTIC ACID, PLASMA: Lactic Acid, Venous: 0.9 mmol/L (ref 0.5–1.9)

## 2019-03-11 MED ORDER — POTASSIUM CHLORIDE CRYS ER 20 MEQ PO TBCR
40.0000 meq | EXTENDED_RELEASE_TABLET | Freq: Once | ORAL | Status: AC
  Administered 2019-03-11: 40 meq via ORAL
  Filled 2019-03-11: qty 2

## 2019-03-11 MED ORDER — SODIUM CHLORIDE 0.9 % IV BOLUS
1000.0000 mL | Freq: Once | INTRAVENOUS | Status: AC
  Administered 2019-03-11: 1000 mL via INTRAVENOUS

## 2019-03-11 NOTE — ED Provider Notes (Signed)
[facility] EMERGENCY DEPARTMENT Provider Note   CSN: [id number] Arrival date & time: 03/11/19  1732     History   Chief Complaint Chief Complaint  Patient presents with   Seizures     HPI Carl Carey is a 38 y.o. male with PMHx bipolar disorder, diabetes, parkinsonism who presents to the ED via EMS for seizure that occurred today.  Per triage report witnessed seizure by family for approximately 15 minutes.  EMS had reported history of due to seizures.  It does appear that while patient was with EMS he appeared to have an episode in route that looked like a seizure, that approximately a couple of seconds.  Patient states he had a seizure but unable to provide much more information. He is currently complaining of muscle soreness diffusely; states this is unchanged from previous hospitalization. No family at bedside to provide more information.   Per chart review: Seen in the ED on 10/30 with chief complaint of seizures.  Was appear that he was complaining of shaking and difficulty eating at that time.  To be extraparametal symptoms likely due to home psychiatric medications.  It appears that the provider was unable to obtain much information from the patient nor the patient's mom regarding what medications patient was taking.  On the ED patient was noted to have an elevated CK likely from pt's rigidity and shaking from his EPS.  He was admitted for extraparametal movements and akathisia.  The provider at that time was able to speak with patient's psychiatrist who recommended reducing Haldol to 10 mg at bedtime as well as discontinuing the Abilify, escalating the Cogentin to 1 mg 3 times daily at time of discharge.   Pt discharged on 11/04 after CK decreased to 447. Pt was discharged home with PCP follow up within 5 days and psychiatry within 1 week.       Past Medical History:  Diagnosis Date   Bipolar 1 disorder ([place])    Diabetes ([place])    Malnutrition ([place])     Panic attack    Parkinsonism [facility])     Patient Active Problem List   Diagnosis Date Noted   Rhabdomyolysis 03/07/2019   Nausea with vomiting 03/06/2019   Extrapyramidal symptom 03/03/2019   Bipolar 1 disorder ([place]) 11/09/2018   Parkinsonian features 07/21/2014   Dysphagia 07/21/2014    Past Surgical History:  Procedure Laterality Date   BIOPSY  01/21/2019   Procedure: BIOPSY;  Surgeon: Otis Brace, MD;  Location: [facility];  Service: Gastroenterology;;   COLONOSCOPY WITH PROPOFOL N/A 01/21/2019   Procedure: COLONOSCOPY WITH PROPOFOL;  Surgeon: Otis Brace, MD;  Location: [place];  Service: Gastroenterology;  Laterality: N/A;   ESOPHAGOGASTRODUODENOSCOPY (EGD) WITH PROPOFOL N/A 01/21/2019   Procedure: ESOPHAGOGASTRODUODENOSCOPY (EGD) WITH PROPOFOL;  Surgeon: Otis Brace, MD;  Location: [place];  Service: Gastroenterology;  Laterality: N/A;   MULTIPLE EXTRACTIONS WITH ALVEOLOPLASTY N/A 07/29/2014   Procedure: Extraction of tooth #'s 1,15,16,17,18 with alveoloplasty and gross debridement of teeth;  Surgeon: Lenn Cal, DDS;  Location: [place];  Service: Oral Surgery;  Laterality: N/A;   NECK SURGERY          Home Medications    Prior to Admission medications   Medication Sig Start Date End Date Taking? Authorizing Provider  loperamide (IMODIUM) 2 MG capsule Take 1 capsule (2 mg total) by mouth 4 (four) times daily as needed for diarrhea or loose stools. Max 16 mg (8 capsules) per day 02/24/19  Yes Regenia Skeeter,  Nicki Reaper, MD  pantoprazole (PROTONIX) 40 MG tablet Take 1 tablet (40 mg total) by mouth 2 (two) times daily. 02/23/19  Yes Fulp, Cammie, MD  benztropine (COGENTIN) 1 MG tablet Take 1 tablet (1 mg total) by mouth 3 (three) times daily. 03/07/19   Mitzi Hansen, MD  haloperidol (HALDOL) 10 MG tablet Take 1 tablet (10 mg total) by mouth daily. Patient not taking: Reported on 03/11/2019 03/07/19   Mitzi Hansen, MD  potassium chloride SA  (KLOR-CON) 20 MEQ tablet Take 1 tablet (20 mEq total) by mouth daily. Patient not taking: Reported on 03/11/2019 02/24/19   Sherwood Gambler, MD    Family History Family History  Problem Relation Age of Onset   Hypertension Maternal Grandmother     Social History Social History   Tobacco Use   Smoking status: Current Some Day Smoker    Types: Cigarettes   Smokeless tobacco: Never Used  Substance Use Topics   Alcohol use: Yes    Comment: socially   Drug use: No     Allergies   Patient has no known allergies.   Review of Systems Review of Systems  Unable to perform ROS: Mental status change  Neurological: Positive for seizures.     Physical Exam Updated Vital Signs BP 131/88 (BP Location: Right Arm)    Pulse (!) 117    Temp 99.3 F (37.4 C) (Oral)    Resp (!) 25    Ht 5' 8"  (1.727 m)    Wt 64.2 kg    SpO2 97%    BMI 21.52 kg/m   Physical Exam Vitals signs and nursing note reviewed.  Constitutional:      Appearance: He is not ill-appearing.  HENT:     Head: Normocephalic and atraumatic.     Comments: No raccoon's sign or battle's sign. Negative hemotympanum bilaterally. No lesions noted to oral cavity. Poor dentition throughout.     Right Ear: Tympanic membrane normal.     Left Ear: Tympanic membrane normal.  Eyes:     Extraocular Movements: Extraocular movements intact.     Conjunctiva/sclera: Conjunctivae normal.     Pupils: Pupils are equal, round, and reactive to light.  Neck:     Musculoskeletal: Neck supple.  Cardiovascular:     Rate and Rhythm: Regular rhythm. Tachycardia present.     Pulses: Normal pulses.  Pulmonary:     Effort: Pulmonary effort is normal.     Breath sounds: Normal breath sounds. No wheezing, rhonchi or rales.  Abdominal:     Palpations: Abdomen is soft.     Tenderness: There is no abdominal tenderness. There is no guarding or rebound.  Skin:    General: Skin is warm and dry.  Neurological:     Mental Status: He is alert  and oriented to person, place, and time.     Comments: Tremors present. Severe stuttering during exam.       ED Treatments / Results  Labs (all labs ordered are listed, but only abnormal results are displayed) Labs Reviewed  BASIC METABOLIC PANEL - Abnormal; Notable for the following components:      Result Value   Potassium 3.2 (*)    Glucose, Bld 124 (*)    BUN <5 (*)    All other components within normal limits  CBC WITH DIFFERENTIAL/PLATELET - Abnormal; Notable for the following components:   WBC 11.0 (*)    Platelets 599 (*)    Neutro Abs 9.4 (*)    Lymphs Abs 0.5 (*)  Abs Immature Granulocytes 0.10 (*)    All other components within normal limits  URINALYSIS, ROUTINE W REFLEX MICROSCOPIC - Abnormal; Notable for the following components:   Specific Gravity, Urine >1.030 (*)    Hgb urine dipstick TRACE (*)    Ketones, ur 15 (*)    All other components within normal limits  CK - Abnormal; Notable for the following components:   Total CK 520 (*)    All other components within normal limits  CBG MONITORING, ED - Abnormal; Notable for the following components:   Glucose-Capillary 101 (*)    All other components within normal limits  ETHANOL  RAPID URINE DRUG SCREEN, HOSP PERFORMED  LACTIC ACID, PLASMA  URINALYSIS, MICROSCOPIC (REFLEX)  LACTIC ACID, PLASMA    EKG None  Radiology Ct Head Wo Contrast  Result Date: 03/11/2019 CLINICAL DATA:  Seizure EXAM: CT HEAD WITHOUT CONTRAST TECHNIQUE: Contiguous axial images were obtained from the base of the skull through the vertex without intravenous contrast. COMPARISON:  03/02/2019 FINDINGS: Brain: No evidence of acute infarction, hemorrhage, hydrocephalus, extra-axial collection or mass lesion/mass effect. Vascular: No hyperdense vessel or unexpected calcification. Skull: Normal. Negative for fracture or focal lesion. Sinuses/Orbits: No acute finding. Other: None. IMPRESSION: No acute intracranial pathology. Electronically  Signed   By: Eddie Candle M.D.   On: 03/11/2019 19:45   Dg Chest Port 1 View  Result Date: 03/11/2019 CLINICAL DATA:  Seizure. EXAM: PORTABLE CHEST 1 VIEW COMPARISON:  Chest radiograph 03/05/2019 FINDINGS: The heart size and mediastinal contours are within normal limits. The lungs are clear. No pneumothorax or large pleural effusion. No acute finding in the visualized skeleton. IMPRESSION: No evidence of active disease in the chest. Electronically Signed   By: Audie Pinto M.D.   On: 03/11/2019 20:36    Procedures Procedures (including critical care time)  Medications Ordered in ED Medications  sodium chloride 0.9 % bolus 1,000 mL (0 mLs Intravenous Stopped 03/11/19 2154)  potassium chloride SA (KLOR-CON) CR tablet 40 mEq (40 mEq Oral Given 03/11/19 2045)     Initial Impression / Assessment and Plan / ED Course  I have reviewed the triage vital signs and the nursing notes.  Pertinent labs & imaging results that were available during my care of the patient were reviewed by me and considered in my medical decision making (see chart for details).    38 year old male presents the ED via EMS for apparently witnessed seizure by family for about 15 minutes.  Per EMS patient has a history of pseudoseizures although I do not see this listed anywhere.  Recently discharged from the hospital after having rhabdomyolysis due to extrapyramidal symptoms from patient's psychiatric medications.  His only complaint currently is body aches but he states that this has been unchanged since being in the hospital for rhabdo.  Again no family at bedside to provide more information.  Patient has no signs of head trauma.  No oral lesions visualized.  Will obtain screening labs as well as CK and CT head.  Patient is tachycardic on arrival although this would be expected with seizure activity.  His temperature is 99.3.  Will check CBC and lactic acid as well as urinalysis and chest x-ray to ensure he does not have any  infection.  Is no tenderness throughout his body to suggest any trauma.  No tenderness to the abdomen.   CT Head negative.  Leukocytosis present at 11,000 flu given this would be suspected with seizure-like activity.  Lactic acid within normal limits.  Potassium mildly decreased at 3.2.  Will give K-Dur in the ED.  It does appear that patient has potassium at home to take as well.  Unsure if he is taking his medications as prescribed.  EKG mildly elevated at 520, 470 at time of discharge.  Will give 1 L NS bolus in the ED.  Alysis does have increased specific gravity consistent with dehydration.  This may be why patient is tachycardic on exam as well.  Will reevaluate after IV fluids given.   X-ray negative as well. Pt has not had any seizure like activity in the ED. question if this was truly seizure or pseudoseizure given EMS report.  I was finally able to discuss this with patient's mother although unfortunately she was unable to give a great history either.  She states the patient was sitting on the couch when he began "shaking all over."  Was recently discharged from the hospital for extraparametal side effects and tremors.  Unsure if this is what mother was seeing. Have discussed case with attending physician Dr. Alvino Chapel who recommends outpatient follow up with neurology.   Pt resting comfortably on reevaluation. Feel he is stable for discharge at this time with PCP and neurology follow up outpatient.   This note was prepared using Dragon voice recognition software and may include unintentional dictation errors due to the inherent limitations of voice recognition software.       Final Clinical Impressions(s) / ED Diagnoses   Final diagnoses:  Seizure-like activity [facility])  Hypokalemia    ED Discharge Orders    None       Eustaquio Maize, PA-C 03/11/19 2200    Davonna Belling, MD 03/12/19 2324

## 2019-03-11 NOTE — ED Notes (Signed)
Pt placed on bedpan

## 2019-03-11 NOTE — Discharge Instructions (Signed)
All of your labwork and CT scan of your head were reassuring.  Continue taking your medications at home as prescribed.  Follow up with [facility]

## 2019-03-11 NOTE — ED Notes (Signed)
[facility] has been called for this patient.

## 2019-03-11 NOTE — ED Triage Notes (Signed)
Pt BIB [facility] from home. Per EMS patient was seen having a seizure by family for about 15 minutes. EMS reports patient has a history of pseudoseizures. EMS reports that patient did have an episode en route to the ED where he appeared to be having a seizure that only lasted for a few seconds. EMS reports the patient "clenched his jaw, stared off, did not respond, and became very diaphoretic. Pt has some stuttering and confusion to time at baseline. Pt is alert and oriented to person and place upon arrival to the ED.

## 2019-03-11 NOTE — ED Notes (Signed)
Attempted to call family twice to arrange discharge plans. No answer.

## 2019-03-11 NOTE — ED Notes (Signed)
Called [facility] for transport home.  

## 2019-03-11 NOTE — ED Notes (Signed)
Patient verbalizes understanding of discharge instructions. Opportunity for questioning and answers were provided. Armband removed by staff, pt discharged from ED. Pt. ambulatory and discharged home via [facility]

## 2019-03-12 ENCOUNTER — Inpatient Hospital Stay (HOSPITAL_COMMUNITY)
Admission: EM | Admit: 2019-03-12 | Discharge: 2019-03-19 | DRG: 092 | Disposition: A | Discharge status: Home / Self Care | Payer: Medicaid Other | Attending: Internal Medicine | Admitting: Internal Medicine

## 2019-03-12 ENCOUNTER — Other Ambulatory Visit: Payer: Self-pay

## 2019-03-12 ENCOUNTER — Encounter (HOSPITAL_COMMUNITY): Payer: Self-pay

## 2019-03-12 DIAGNOSIS — B370 Candidal stomatitis: Secondary | ICD-10-CM | POA: Diagnosis present

## 2019-03-12 DIAGNOSIS — R259 Unspecified abnormal involuntary movements: Secondary | ICD-10-CM | POA: Diagnosis present

## 2019-03-12 DIAGNOSIS — E876 Hypokalemia: Secondary | ICD-10-CM | POA: Diagnosis present

## 2019-03-12 DIAGNOSIS — Z79899 Other long term (current) drug therapy: Secondary | ICD-10-CM | POA: Diagnosis not present

## 2019-03-12 DIAGNOSIS — R945 Abnormal results of liver function studies: Secondary | ICD-10-CM | POA: Diagnosis present

## 2019-03-12 DIAGNOSIS — R569 Unspecified convulsions: Secondary | ICD-10-CM | POA: Diagnosis not present

## 2019-03-12 DIAGNOSIS — M6282 Rhabdomyolysis: Secondary | ICD-10-CM | POA: Diagnosis present

## 2019-03-12 DIAGNOSIS — K5080 Crohn's disease of both small and large intestine without complications: Secondary | ICD-10-CM | POA: Diagnosis present

## 2019-03-12 DIAGNOSIS — Z8249 Family history of ischemic heart disease and other diseases of the circulatory system: Secondary | ICD-10-CM | POA: Diagnosis not present

## 2019-03-12 DIAGNOSIS — G210 Malignant neuroleptic syndrome: Principal | ICD-10-CM

## 2019-03-12 DIAGNOSIS — Z86010 Personal history of colonic polyps: Secondary | ICD-10-CM

## 2019-03-12 DIAGNOSIS — F8081 Childhood onset fluency disorder: Secondary | ICD-10-CM | POA: Diagnosis present

## 2019-03-12 DIAGNOSIS — T43505A Adverse effect of unspecified antipsychotics and neuroleptics, initial encounter: Secondary | ICD-10-CM | POA: Diagnosis present

## 2019-03-12 DIAGNOSIS — F3132 Bipolar disorder, current episode depressed, moderate: Secondary | ICD-10-CM | POA: Diagnosis present

## 2019-03-12 DIAGNOSIS — K259 Gastric ulcer, unspecified as acute or chronic, without hemorrhage or perforation: Secondary | ICD-10-CM | POA: Diagnosis present

## 2019-03-12 DIAGNOSIS — F17210 Nicotine dependence, cigarettes, uncomplicated: Secondary | ICD-10-CM | POA: Diagnosis present

## 2019-03-12 DIAGNOSIS — G9340 Encephalopathy, unspecified: Secondary | ICD-10-CM | POA: Diagnosis present

## 2019-03-12 DIAGNOSIS — R1310 Dysphagia, unspecified: Secondary | ICD-10-CM | POA: Diagnosis present

## 2019-03-12 DIAGNOSIS — Z20828 Contact with and (suspected) exposure to other viral communicable diseases: Secondary | ICD-10-CM | POA: Diagnosis present

## 2019-03-12 DIAGNOSIS — E119 Type 2 diabetes mellitus without complications: Secondary | ICD-10-CM | POA: Diagnosis present

## 2019-03-12 DIAGNOSIS — K0889 Other specified disorders of teeth and supporting structures: Secondary | ICD-10-CM | POA: Diagnosis present

## 2019-03-12 DIAGNOSIS — R29818 Other symptoms and signs involving the nervous system: Secondary | ICD-10-CM | POA: Diagnosis not present

## 2019-03-12 DIAGNOSIS — R7989 Other specified abnormal findings of blood chemistry: Secondary | ICD-10-CM

## 2019-03-12 DIAGNOSIS — F319 Bipolar disorder, unspecified: Secondary | ICD-10-CM | POA: Diagnosis present

## 2019-03-12 DIAGNOSIS — D649 Anemia, unspecified: Secondary | ICD-10-CM | POA: Diagnosis present

## 2019-03-12 DIAGNOSIS — K626 Ulcer of anus and rectum: Secondary | ICD-10-CM | POA: Diagnosis present

## 2019-03-12 DIAGNOSIS — K209 Esophagitis, unspecified without bleeding: Secondary | ICD-10-CM | POA: Diagnosis present

## 2019-03-12 DIAGNOSIS — G251 Drug-induced tremor: Secondary | ICD-10-CM | POA: Diagnosis present

## 2019-03-12 DIAGNOSIS — R251 Tremor, unspecified: Secondary | ICD-10-CM

## 2019-03-12 DIAGNOSIS — F3130 Bipolar disorder, current episode depressed, mild or moderate severity, unspecified: Secondary | ICD-10-CM | POA: Diagnosis not present

## 2019-03-12 DIAGNOSIS — R748 Abnormal levels of other serum enzymes: Secondary | ICD-10-CM | POA: Diagnosis present

## 2019-03-12 DIAGNOSIS — K5090 Crohn's disease, unspecified, without complications: Secondary | ICD-10-CM | POA: Diagnosis not present

## 2019-03-12 LAB — CBC WITH DIFFERENTIAL/PLATELET
Abs Immature Granulocytes: 0.09 10*3/uL — ABNORMAL HIGH (ref 0.00–0.07)
Basophils Absolute: 0 10*3/uL (ref 0.0–0.1)
Basophils Relative: 0 %
Eosinophils Absolute: 0 10*3/uL (ref 0.0–0.5)
Eosinophils Relative: 0 %
HCT: 41.8 % (ref 39.0–52.0)
Hemoglobin: 13.3 g/dL (ref 13.0–17.0)
Immature Granulocytes: 1 %
Lymphocytes Relative: 7 %
Lymphs Abs: 0.7 10*3/uL (ref 0.7–4.0)
MCH: 26.3 pg (ref 26.0–34.0)
MCHC: 31.8 g/dL (ref 30.0–36.0)
MCV: 82.8 fL (ref 80.0–100.0)
Monocytes Absolute: 0.8 10*3/uL (ref 0.1–1.0)
Monocytes Relative: 7 %
Neutro Abs: 8.9 10*3/uL — ABNORMAL HIGH (ref 1.7–7.7)
Neutrophils Relative %: 85 %
Platelets: 586 10*3/uL — ABNORMAL HIGH (ref 150–400)
RBC: 5.05 MIL/uL (ref 4.22–5.81)
RDW: 14.6 % (ref 11.5–15.5)
WBC: 10.5 10*3/uL (ref 4.0–10.5)
nRBC: 0 % (ref 0.0–0.2)

## 2019-03-12 LAB — HEPATIC FUNCTION PANEL
ALT: 38 U/L (ref 0–44)
AST: 36 U/L (ref 15–41)
Albumin: 2.6 g/dL — ABNORMAL LOW (ref 3.5–5.0)
Alkaline Phosphatase: 165 U/L — ABNORMAL HIGH (ref 38–126)
Bilirubin, Direct: 0.1 mg/dL (ref 0.0–0.2)
Indirect Bilirubin: 0.8 mg/dL (ref 0.3–0.9)
Total Bilirubin: 0.9 mg/dL (ref 0.3–1.2)
Total Protein: 5.5 g/dL — ABNORMAL LOW (ref 6.5–8.1)

## 2019-03-12 LAB — BASIC METABOLIC PANEL
Anion gap: 10 (ref 5–15)
BUN: <5 mg/dL — ABNORMAL LOW (ref 6–20)
CO2: 21 mmol/L — ABNORMAL LOW (ref 22–32)
Calcium: 8.9 mg/dL (ref 8.9–10.3)
Chloride: 106 mmol/L (ref 98–111)
Creatinine, Ser: 0.69 mg/dL (ref 0.61–1.24)
GFR calc Af Amer: >60 mL/min (ref >60)
GFR calc non Af Amer: >60 mL/min (ref >60)
Glucose, Bld: 105 mg/dL — ABNORMAL HIGH (ref 70–99)
Potassium: 3.8 mmol/L (ref 3.5–5.1)
Sodium: 137 mmol/L (ref 135–145)

## 2019-03-12 LAB — URINALYSIS, ROUTINE W REFLEX MICROSCOPIC
Bilirubin Urine: NEGATIVE
Glucose, UA: NEGATIVE mg/dL
Hgb urine dipstick: NEGATIVE
Ketones, ur: 20 mg/dL — AB
Leukocytes,Ua: NEGATIVE
Nitrite: NEGATIVE
Protein, ur: NEGATIVE mg/dL
Specific Gravity, Urine: 1.019 (ref 1.005–1.030)
pH: 5 (ref 5.0–8.0)

## 2019-03-12 LAB — PHOSPHORUS
Phosphorus: 3.4 mg/dL (ref 2.5–4.6)
Phosphorus: 2.8 mg/dL (ref 2.5–4.6)

## 2019-03-12 LAB — MAGNESIUM: Magnesium: 1.6 mg/dL — ABNORMAL LOW (ref 1.7–2.4)

## 2019-03-12 LAB — CARBAMAZEPINE LEVEL, TOTAL: Carbamazepine Lvl: <2 ug/mL — ABNORMAL LOW (ref 4.0–12.0)

## 2019-03-12 LAB — CK: Total CK: 788 U/L — ABNORMAL HIGH (ref 49–397)

## 2019-03-12 LAB — TSH: TSH: 0.633 u[IU]/mL (ref 0.350–4.500)

## 2019-03-12 LAB — CBG MONITORING, ED: Glucose-Capillary: 93 mg/dL (ref 70–99)

## 2019-03-12 MED ORDER — LORAZEPAM 2 MG/ML IJ SOLN
1.0000 mg | INTRAMUSCULAR | Status: DC | PRN
  Administered 2019-03-13: 2 mg via INTRAVENOUS
  Administered 2019-03-13: 1 mg via INTRAVENOUS
  Administered 2019-03-13: 2 mg via INTRAVENOUS
  Filled 2019-03-12 (×3): qty 1

## 2019-03-12 MED ORDER — ONDANSETRON HCL 4 MG/2ML IJ SOLN: 4.0000 mg | Freq: Four times a day (QID) | INTRAMUSCULAR | Status: DC | PRN

## 2019-03-12 MED ORDER — SODIUM CHLORIDE 0.9 % IV SOLN
INTRAVENOUS | Status: DC
  Administered 2019-03-12 – 2019-03-13 (×2): via INTRAVENOUS

## 2019-03-12 MED ORDER — ADULT MULTIVITAMIN W/MINERALS CH
1.0000 | ORAL_TABLET | Freq: Every day | ORAL | Status: DC
  Administered 2019-03-14 – 2019-03-19 (×6): 1 via ORAL
  Filled 2019-03-12 (×7): qty 1

## 2019-03-12 MED ORDER — NYSTATIN 100000 UNIT/ML MT SUSP
5.0000 mL | Freq: Four times a day (QID) | OROMUCOSAL | Status: DC
  Administered 2019-03-12 – 2019-03-15 (×9): 500000 [IU] via ORAL
  Filled 2019-03-12 (×11): qty 5

## 2019-03-12 MED ORDER — MAGNESIUM SULFATE 2 GM/50ML IV SOLN
2.0000 g | Freq: Once | INTRAVENOUS | Status: AC
  Administered 2019-03-12: 2 g via INTRAVENOUS
  Filled 2019-03-12: qty 50

## 2019-03-12 MED ORDER — ACETAMINOPHEN 325 MG PO TABS
650.0000 mg | ORAL_TABLET | Freq: Four times a day (QID) | ORAL | Status: DC | PRN
  Administered 2019-03-14 – 2019-03-16 (×3): 650 mg via ORAL
  Filled 2019-03-12 (×3): qty 2

## 2019-03-12 MED ORDER — ACETAMINOPHEN 10 MG/ML IV SOLN
500.0000 mg | Freq: Once | INTRAVENOUS | Status: AC
  Administered 2019-03-12: 500 mg via INTRAVENOUS
  Filled 2019-03-12 (×2): qty 50

## 2019-03-12 MED ORDER — ENOXAPARIN SODIUM 40 MG/0.4ML ~~LOC~~ SOLN
40.0000 mg | SUBCUTANEOUS | Status: DC
  Administered 2019-03-13 – 2019-03-19 (×7): 40 mg via SUBCUTANEOUS
  Filled 2019-03-12 (×7): qty 0.4

## 2019-03-12 MED ORDER — ONDANSETRON HCL 4 MG PO TABS
4.0000 mg | ORAL_TABLET | Freq: Four times a day (QID) | ORAL | Status: DC | PRN
  Administered 2019-03-14: 4 mg via ORAL
  Filled 2019-03-12: qty 1

## 2019-03-12 MED ORDER — LORAZEPAM 2 MG/ML IJ SOLN
1.0000 mg | Freq: Four times a day (QID) | INTRAMUSCULAR | Status: DC | PRN
  Administered 2019-03-12 – 2019-03-13 (×2): 1 mg via INTRAVENOUS
  Filled 2019-03-12 (×2): qty 1

## 2019-03-12 MED ORDER — SODIUM CHLORIDE 0.9 % IV BOLUS
500.0000 mL | Freq: Once | INTRAVENOUS | Status: AC
  Administered 2019-03-12: 500 mL via INTRAVENOUS

## 2019-03-12 MED ORDER — SODIUM CHLORIDE 0.9 % IV BOLUS
1000.0000 mL | Freq: Once | INTRAVENOUS | Status: AC
  Administered 2019-03-12: 1000 mL via INTRAVENOUS

## 2019-03-12 MED ORDER — VITAMIN B-1 100 MG PO TABS
100.0000 mg | ORAL_TABLET | Freq: Every day | ORAL | Status: DC
  Administered 2019-03-14 – 2019-03-19 (×6): 100 mg via ORAL
  Filled 2019-03-12 (×7): qty 1

## 2019-03-12 MED ORDER — LORAZEPAM 1 MG PO TABS
1.0000 mg | ORAL_TABLET | ORAL | Status: DC | PRN
  Administered 2019-03-14: 1 mg via ORAL
  Filled 2019-03-12: qty 1

## 2019-03-12 MED ORDER — DIPHENHYDRAMINE HCL 50 MG/ML IJ SOLN
25.0000 mg | Freq: Three times a day (TID) | INTRAMUSCULAR | Status: DC
  Administered 2019-03-12 – 2019-03-14 (×5): 25 mg via INTRAVENOUS
  Filled 2019-03-12 (×5): qty 1

## 2019-03-12 MED ORDER — DIPHENHYDRAMINE HCL 50 MG/ML IJ SOLN: 25.0000 mg | Freq: Three times a day (TID) | INTRAMUSCULAR | Status: DC

## 2019-03-12 MED ORDER — DIPHENHYDRAMINE HCL 50 MG/ML IJ SOLN: 25.0000 mg | Freq: Three times a day (TID) | INTRAMUSCULAR | Status: DC | PRN

## 2019-03-12 MED ORDER — ACETAMINOPHEN 650 MG RE SUPP: 650.0000 mg | Freq: Four times a day (QID) | RECTAL | Status: DC | PRN

## 2019-03-12 MED ORDER — DIPHENHYDRAMINE HCL 50 MG/ML IJ SOLN
50.0000 mg | Freq: Once | INTRAMUSCULAR | Status: AC
  Administered 2019-03-12: 50 mg via INTRAMUSCULAR
  Filled 2019-03-12: qty 1

## 2019-03-12 MED ORDER — FOLIC ACID 5 MG/ML IJ SOLN
1.0000 mg | Freq: Every day | INTRAMUSCULAR | Status: DC
  Administered 2019-03-13 – 2019-03-14 (×2): 1 mg via INTRAVENOUS
  Filled 2019-03-12 (×3): qty 0.2

## 2019-03-12 NOTE — ED Notes (Signed)
This RN paged hospitalist for consideration of Swallow screen or diet order changes for this pt. Will continue to monitor.

## 2019-03-12 NOTE — ED Triage Notes (Signed)
Pt arrived via [facility] from home CC tremors possible seizure activity X 3 days. Pt was seen yesterday for same complaints. Pt claims to be compliant with medications but family states otherwise. Pt denies chest pain.   Hx Parkinson's, Extrapyramidal symptoms, behavior issues, and homelessness .   20G Left FA

## 2019-03-12 NOTE — ED Provider Notes (Signed)
[facility] DEPT Provider Note   CSN: [id number] Arrival date & time: 03/12/19  1107     History   Chief Complaint Chief Complaint  Patient presents with   Tremors    HPI Carl Carey is a 38 y.o. male with PMHx bipolar disorder, diabetes, parkinsonism who presents to the ED today with complaint of tremors.   Pt seen in the ED yesterday for seizure like activity although expected that EMS was called out yesterday for continued tremors s/2 extrapyramidal side effects from psych meds; recently admitted to the hospital from 10/30 - 11/04 for EPS as well as rhabdo secondary to EPS. At time of hospitalization pt was on Abilify, Haldol, Trazodone, Cogentin, and possibly Celexa. Abilify was D/C, Haldol decreased to 10 mg qhs from 15 mg qhs, and Celexa discontinued. Benadryl and Clonazepam were given during hospital stay with improvement in symptoms. Pt's CK level at time of admission was 1165 --> 986 --> 447 at time of discharge.   ED workup yesterday with mild elevation in CK at 520 although all other labwork unremarkable; pt discharged home yesterday with close PCP follow up.   It appears EMS was called out again today for worsening tremors/questionable seizure like activity x 3 days. Family reported to EMS that pt was not taking his medications although he states he is.        Past Medical History:  Diagnosis Date   Bipolar 1 disorder ([place])    Diabetes ([place])    Malnutrition ([place])    Panic attack    Parkinsonism [facility])     Patient Active Problem List   Diagnosis Date Noted   Rhabdomyolysis 03/07/2019   Nausea with vomiting 03/06/2019   Extrapyramidal symptom 03/03/2019   Bipolar 1 disorder ([place]) 11/09/2018   Parkinsonian features 07/21/2014   Dysphagia 07/21/2014    Past Surgical History:  Procedure Laterality Date   BIOPSY  01/21/2019   Procedure: BIOPSY;  Surgeon: Otis Brace, MD;  Location: [facility];  Service:  Gastroenterology;;   COLONOSCOPY WITH PROPOFOL N/A 01/21/2019   Procedure: COLONOSCOPY WITH PROPOFOL;  Surgeon: Otis Brace, MD;  Location: [facility];  Service: Gastroenterology;  Laterality: N/A;   ESOPHAGOGASTRODUODENOSCOPY (EGD) WITH PROPOFOL N/A 01/21/2019   Procedure: ESOPHAGOGASTRODUODENOSCOPY (EGD) WITH PROPOFOL;  Surgeon: Otis Brace, MD;  Location: [facility];  Service: Gastroenterology;  Laterality: N/A;   MULTIPLE EXTRACTIONS WITH ALVEOLOPLASTY N/A 07/29/2014   Procedure: Extraction of tooth #'s 1,15,16,17,18 with alveoloplasty and gross debridement of teeth;  Surgeon: Lenn Cal, DDS;  Location: [facility];  Service: Oral Surgery;  Laterality: N/A;   NECK SURGERY          Home Medications    Prior to Admission medications   Medication Sig Start Date End Date Taking? Authorizing Provider  benztropine (COGENTIN) 1 MG tablet Take 1 tablet (1 mg total) by mouth 3 (three) times daily. 03/07/19  Yes Christian, Rylee, MD  loperamide (IMODIUM) 2 MG capsule Take 1 capsule (2 mg total) by mouth 4 (four) times daily as needed for diarrhea or loose stools. Max 16 mg (8 capsules) per day 02/24/19  Yes Sherwood Gambler, MD  pantoprazole (PROTONIX) 40 MG tablet Take 1 tablet (40 mg total) by mouth 2 (two) times daily. 02/23/19  Yes Fulp, Cammie, MD  potassium chloride SA (KLOR-CON) 20 MEQ tablet Take 1 tablet (20 mEq total) by mouth daily. 02/24/19  Yes Sherwood Gambler, MD  haloperidol (HALDOL) 10 MG tablet Take 1 tablet (10 mg total) by mouth  daily. Patient not taking: Reported on 03/11/2019 03/07/19   Mitzi Hansen, MD    Family History Family History  Problem Relation Age of Onset   Hypertension Maternal Grandmother     Social History Social History   Tobacco Use   Smoking status: Current Some Day Smoker    Types: Cigarettes   Smokeless tobacco: Never Used  Substance Use Topics   Alcohol use: Yes    Comment: socially   Drug use: No     Allergies     Patient has no known allergies.   Review of Systems Review of Systems  Constitutional: Negative for fever.  Respiratory: Negative for cough.   Neurological: Positive for tremors.     Physical Exam Updated Vital Signs BP 120/86    Pulse 96    Temp 99 F (37.2 C)    Resp (!) 23    SpO2 96%   Physical Exam Vitals signs and nursing note reviewed.  Constitutional:      Appearance: He is not ill-appearing or diaphoretic.  HENT:     Head: Normocephalic and atraumatic.  Eyes:     Extraocular Movements: Extraocular movements intact.     Conjunctiva/sclera: Conjunctivae normal.     Pupils: Pupils are equal, round, and reactive to light.  Neck:     Musculoskeletal: Neck supple.  Cardiovascular:     Rate and Rhythm: Normal rate and regular rhythm.     Pulses: Normal pulses.  Pulmonary:     Effort: Pulmonary effort is normal.     Breath sounds: Normal breath sounds. No wheezing, rhonchi or rales.  Abdominal:     Palpations: Abdomen is soft.     Tenderness: There is no abdominal tenderness. There is no guarding or rebound.  Skin:    General: Skin is warm and dry.  Neurological:     Mental Status: He is alert.     Motor: Tremor present.      ED Treatments / Results  Labs (all labs ordered are listed, but only abnormal results are displayed) Labs Reviewed  CARBAMAZEPINE LEVEL, TOTAL - Abnormal; Notable for the following components:      Result Value   Carbamazepine Lvl <2.0 (*)    All other components within normal limits  BASIC METABOLIC PANEL - Abnormal; Notable for the following components:   CO2 21 (*)    Glucose, Bld 105 (*)    BUN <5 (*)    All other components within normal limits  CBC WITH DIFFERENTIAL/PLATELET - Abnormal; Notable for the following components:   Platelets 586 (*)    Neutro Abs 8.9 (*)    Abs Immature Granulocytes 0.09 (*)    All other components within normal limits  CK - Abnormal; Notable for the following components:   Total CK 788 (*)     All other components within normal limits  URINALYSIS, ROUTINE W REFLEX MICROSCOPIC - Abnormal; Notable for the following components:   Ketones, ur 20 (*)    All other components within normal limits  SARS CORONAVIRUS 2 (TAT 6-24 HRS)  MAGNESIUM  PHOSPHORUS  CBG MONITORING, ED    EKG EKG Interpretation  Date/Time:  Monday March 12 2019 12:10:03 EST Ventricular Rate:  75 PR Interval:    QRS Duration: 98 QT Interval:  411 QTC Calculation: 460 R Axis:   66 Text Interpretation: Sinus arrhythmia Probable left ventricular hypertrophy ST elev, probable normal early repol pattern No acute changes Confirmed by Varney Biles [id number]) on 03/12/2019 1:09:29 PM  Radiology Ct Head Wo Contrast  Result Date: 03/11/2019 CLINICAL DATA:  Seizure EXAM: CT HEAD WITHOUT CONTRAST TECHNIQUE: Contiguous axial images were obtained from the base of the skull through the vertex without intravenous contrast. COMPARISON:  03/02/2019 FINDINGS: Brain: No evidence of acute infarction, hemorrhage, hydrocephalus, extra-axial collection or mass lesion/mass effect. Vascular: No hyperdense vessel or unexpected calcification. Skull: Normal. Negative for fracture or focal lesion. Sinuses/Orbits: No acute finding. Other: None. IMPRESSION: No acute intracranial pathology. Electronically Signed   By: Eddie Candle M.D.   On: 03/11/2019 19:45   Dg Chest Port 1 View  Result Date: 03/11/2019 CLINICAL DATA:  Seizure. EXAM: PORTABLE CHEST 1 VIEW COMPARISON:  Chest radiograph 03/05/2019 FINDINGS: The heart size and mediastinal contours are within normal limits. The lungs are clear. No pneumothorax or large pleural effusion. No acute finding in the visualized skeleton. IMPRESSION: No evidence of active disease in the chest. Electronically Signed   By: Audie Pinto M.D.   On: 03/11/2019 20:36    Procedures Procedures (including critical care time)  Medications Ordered in ED Medications  sodium chloride 0.9 % bolus 1,000  mL (has no administration in time range)  0.9 %  sodium chloride infusion (has no administration in time range)  LORazepam (ATIVAN) injection 1 mg (has no administration in time range)  diphenhydrAMINE (BENADRYL) injection 25 mg (has no administration in time range)  diphenhydrAMINE (BENADRYL) injection 50 mg (50 mg Intramuscular Given 03/12/19 1218)  sodium chloride 0.9 % bolus 1,000 mL (0 mLs Intravenous Stopped 03/12/19 1358)     Initial Impression / Assessment and Plan / ED Course  I have reviewed the triage vital signs and the nursing notes.  Pertinent labs & imaging results that were available during my care of the patient were reviewed by me and considered in my medical decision making (see chart for details).  38 year old male who presents to the ED today with complaint of tremors. Recently admitted to the hospital with rhabdo s/2 EPS from 10/30-11/04. Return to the ED yesterday for seizure like activity; it appears that family called EMS due to pt's continued tremors. ED workup unremarkable yesterday and pt discharged home. Pt returned again today with similar presentation; family called EMS again for seizure like activity. Unable to get into contact with family today; was able to speak to them yesterday regarding presentation - appears pt was sitting on couch and began shaking diffusely although still alert. Vital signs stable today; pt afebrile. He is initially tachycardic at 106 although has decreased during hospital stay. Pt given Benadryl in the ED and has been sleeping comfortably; heart rate decreased to the 80's. It appears heart rate increased when pt is up and moving due to his tremors. Given continued symptoms and negative workup yesterday will get psych involved today for med rec; they were involved during last hospitalization and had altered pt's medications including discontinuing abilify and celexa. Changing Haldol from 15 mg to 10 mg.   CBC - no leukocytosis. Hgb stable.  BMP  - No electrolyte abnormalities. Potassium improved from 3.2 yesterday to 3.8 today.  U/A without infection.  CK - elevated at 788 (520 yesterday). Pt receiving fluids.   Clinical Course as of Mar 11 1528  [facility] Mar 12, 2019  1442 Discussed case with Dr. Dwyane Dee psych; recommends medicine admission, IV hydration, pt to stop ALL antipsychotics, continue Ativan and Benadryl. Pt appears to be in NMS.    [MV]    Clinical Course User Index [MV] Alroy Bailiff,  Munirah Doerner, PA-C   Discussed case with Dr. Tyrell Antonio with hospitalist service; had initially discussed admitted to internal medicine service at [facility] given they saw him less than 1 week ago during his last hospitalization. Unfortunately pts are currently being held in the ED at [facility]; does not seem feasible to [facility] to [facility] at this time. Dr. Kathrynn Humble attending physician had discussed further with Dr. Tyrell Antonio who agrees to accept for admission but recommends getting neurology involved; will call at this time.   Neurology to discuss further with hospitalist regarding involvement.        Final Clinical Impressions(s) / ED Diagnoses   Final diagnoses:  Tremor  NMS (neuroleptic malignant syndrome)    ED Discharge Orders    None       Eustaquio Maize, PA-C 03/12/19 [place], [place], MD 03/13/19 [phone]

## 2019-03-12 NOTE — ED Notes (Signed)
Pharmacy messaged for missing medications

## 2019-03-12 NOTE — ED Notes (Signed)
Messaged pharmacy for missing dose of Tylenol

## 2019-03-12 NOTE — Progress Notes (Signed)
Patient admitted earlier today with concern for NMS vs. Extrapyramidal symptoms.  He was made NPO due to concern for dysphagia.  Awaiting speech therapy swallow evaluation.  Will remain NPO and continue IVF for now.   Carlyon Shadow, M.D.

## 2019-03-12 NOTE — ED Notes (Signed)
Per hospitalist consult pt is to remain NPO until tomorrow when [facility] therapy can do swallow screen

## 2019-03-12 NOTE — ED Notes (Signed)
TTS computer at bedside

## 2019-03-12 NOTE — H&P (Signed)
History and Physical  Carl Carey YQM:[id number] DOB: 1980-11-11 DOA: 03/12/2019  PCP: System, Pcp Not In Patient coming from: Home   I have personally briefly reviewed patient's old medical records in [place]   Chief Complaint: Tremors.   HPI: Carl Carey is a 38 y.o. male past medical history significant for bipolar disorder, Parkinsonism from antipsychotics in the past,  diabetes, EPS from medications. who presents to the emergency department with tremors.  Patient was seen in the ED on 03/11/19/2020 for presumed seizures like activities, EMS was called out yesterday for continued tremors.  Patient had a recent admission on 11/04 for EPS as well as rhabdo.  During prior hospitalization patient was on Abilify 5, Haldol, trazodone, Cogentin and possibly Celexa.  Abilify 5 was discontinued, his Haldol dose was decreased to 10 mg at at bedtime from 15 mg and Celexa was also discontinued.  During prior hospitalization patient received Benadryl and clonazepam with improvement of his symptoms.  During prior admission his CK level was 1165>>> 447 at the time of discharge.  EMS was called out again today due to worsening tremors/questionable seizure-like activity times  3 days. ED physician spoke with Dr. Dwyane Dee from Psych  who recommends patient to be admitted to medicine for IV hydration, to stop all antipsychotics, continue Ativan and Benadryl.  Per psych patient might be in NMS.   Evaluation in the ED: Sodium 137, potassium 3.8, CO2 21, BUN 5, creatinine 0.6, magnesium 1.6, total CK 788, phosphorus 3.4, hemoglobin 13, white blood cell 10, platelets 586 by negative for infection. SIRS coronavirus 2 pending.  Chest x-ray no evidence of active disease in the chest. She was found to be tachycardic, tachypneic, hypertensive.  Patient received intramuscular 50 mg of Benadryl with improvement of his tremors. Patient is alert, upper extremities and lower extremity were rigidity, tremors.  He  answer a few questions with a singing tone.   I was able to contact patient's mother who relate that patient has been having this shaking movement home at the time his eyes are open and he is responsive to few questions.  Mother does not know what medications patient is currently taking.  She said that he takes his own medications.  When I asked [name] what medication he takes, he said that he takes Haldol and trazodone.  Review of Systems: All systems reviewed and apart from history of presenting illness, are negative.  Past Medical History:  Diagnosis Date   Bipolar 1 disorder ([place])    Diabetes ([place])    Malnutrition ([place])    Panic attack    Parkinsonism [facility])    Past Surgical History:  Procedure Laterality Date   BIOPSY  01/21/2019   Procedure: BIOPSY;  Surgeon: Otis Brace, MD;  Location: [facility];  Service: Gastroenterology;;   COLONOSCOPY WITH PROPOFOL N/A 01/21/2019   Procedure: COLONOSCOPY WITH PROPOFOL;  Surgeon: Otis Brace, MD;  Location: [place];  Service: Gastroenterology;  Laterality: N/A;   ESOPHAGOGASTRODUODENOSCOPY (EGD) WITH PROPOFOL N/A 01/21/2019   Procedure: ESOPHAGOGASTRODUODENOSCOPY (EGD) WITH PROPOFOL;  Surgeon: Otis Brace, MD;  Location: [facility];  Service: Gastroenterology;  Laterality: N/A;   MULTIPLE EXTRACTIONS WITH ALVEOLOPLASTY N/A 07/29/2014   Procedure: Extraction of tooth #'s 1,15,16,17,18 with alveoloplasty and gross debridement of teeth;  Surgeon: Lenn Cal, DDS;  Location: [facility];  Service: Oral Surgery;  Laterality: N/A;   NECK SURGERY     Social History:  reports that he has been smoking cigarettes. He has never used  smokeless tobacco. He reports current alcohol use. He reports that he does not use drugs.   No Known Allergies  Family History  Problem Relation Age of Onset   Hypertension Maternal Grandmother     Prior to Admission medications   Medication Sig Start Date End Date Taking? Authorizing  Provider  benztropine (COGENTIN) 1 MG tablet Take 1 tablet (1 mg total) by mouth 3 (three) times daily. 03/07/19  Yes Christian, Rylee, MD  loperamide (IMODIUM) 2 MG capsule Take 1 capsule (2 mg total) by mouth 4 (four) times daily as needed for diarrhea or loose stools. Max 16 mg (8 capsules) per day 02/24/19  Yes Sherwood Gambler, MD  pantoprazole (PROTONIX) 40 MG tablet Take 1 tablet (40 mg total) by mouth 2 (two) times daily. 02/23/19  Yes Fulp, Cammie, MD  potassium chloride SA (KLOR-CON) 20 MEQ tablet Take 1 tablet (20 mEq total) by mouth daily. 02/24/19  Yes Sherwood Gambler, MD  haloperidol (HALDOL) 10 MG tablet Take 1 tablet (10 mg total) by mouth daily. Patient not taking: Reported on 03/11/2019 03/07/19   Mitzi Hansen, MD   Physical Exam: Vitals:   03/12/19 1430 03/12/19 1500 03/12/19 1530 03/12/19 1600  BP: (!) 121/91 128/75 134/89 113/61  Pulse: 94 86 80 76  Resp: (!) 39 (!) 22 (!) 24 17  Temp:      TempSrc:      SpO2: 99% 98% 97% 99%     General exam: chronic ill appearing. Tremors.   Head, eyes and ENT: Nontraumatic and normocephalic. Pupils equally reacting to light and accommodation. Oral mucosa moist.  Neck: Supple. No JVD, carotid bruit or thyromegaly.  Lymphatics: No lymphadenopathy.  Respiratory system: Clear to auscultation. No increased work of breathing.  Cardiovascular system: S1 and S2 heard, RRR. No JVD, murmurs, gallops, clicks or pedal edema.  Gastrointestinal system: Abdomen is nondistended, soft and nontender. Normal bowel sounds heard. No organomegaly or masses appreciated.  Central nervous system: Alert and oriented. No focal neurological deficits.  Extremities: Symmetric 5 x 5 power. Peripheral pulses symmetrically felt.   Skin: No rashes or acute findings.  Musculoskeletal system: Negative exam.  Psychiatry: Pleasant and cooperative.   Labs on Admission:  Basic Metabolic Panel: Recent Labs  Lab 03/11/19 1904 03/12/19 1150  NA 136  137  K 3.2* 3.8  CL 102 106  CO2 22 21*  GLUCOSE 124* 105*  BUN <5* <5*  CREATININE 1.08 0.69  CALCIUM 9.1 8.9  MG  --  1.6*  PHOS  --  3.4   Liver Function Tests: No results for input(s): AST, ALT, ALKPHOS, BILITOT, PROT, ALBUMIN in the last 168 hours. No results for input(s): LIPASE, AMYLASE in the last 168 hours. No results for input(s): AMMONIA in the last 168 hours. CBC: Recent Labs  Lab 03/11/19 1904 03/12/19 1150  WBC 11.0* 10.5  NEUTROABS 9.4* 8.9*  HGB 13.2 13.3  HCT 41.2 41.8  MCV 81.9 82.8  PLT 599* 586*   Cardiac Enzymes: Recent Labs  Lab 03/11/19 1904 03/12/19 1150  CKTOTAL 520* 788*    BNP (last 3 results) No results for input(s): PROBNP in the last 8760 hours. CBG: Recent Labs  Lab 03/11/19 1953 03/12/19 1150  GLUCAP 101* 93    Radiological Exams on Admission: Ct Head Wo Contrast  Result Date: 03/11/2019 CLINICAL DATA:  Seizure EXAM: CT HEAD WITHOUT CONTRAST TECHNIQUE: Contiguous axial images were obtained from the base of the skull through the vertex without intravenous contrast. COMPARISON:  03/02/2019 FINDINGS:  Brain: No evidence of acute infarction, hemorrhage, hydrocephalus, extra-axial collection or mass lesion/mass effect. Vascular: No hyperdense vessel or unexpected calcification. Skull: Normal. Negative for fracture or focal lesion. Sinuses/Orbits: No acute finding. Other: None. IMPRESSION: No acute intracranial pathology. Electronically Signed   By: Eddie Candle M.D.   On: 03/11/2019 19:45   Dg Chest Port 1 View  Result Date: 03/11/2019 CLINICAL DATA:  Seizure. EXAM: PORTABLE CHEST 1 VIEW COMPARISON:  Chest radiograph 03/05/2019 FINDINGS: The heart size and mediastinal contours are within normal limits. The lungs are clear. No pneumothorax or large pleural effusion. No acute finding in the visualized skeleton. IMPRESSION: No evidence of active disease in the chest. Electronically Signed   By: Audie Pinto M.D.   On: 03/11/2019 20:36     EKG: Independently reviewed.   Assessment/Plan Active Problems:   Parkinsonian features   Dysphagia   Bipolar 1 disorder (H[facility])   Extrapyramidal symptom   Rhabdomyolysis   NMS (neuroleptic malignant syndrome)  1-Possible NMS versus extrapyramidal symptoms: -Patient presented with tremors, rigidity, tachycardic heart rate 117, tachypneic respiration 28, hypertensive blood pressure 148/84 temperature 99.  He has not been hypothermic. -Psychiatric recommending to hold all psychiatric medications and to treat patient with IV Ativan and Benadryl for presumed NMS. -Case was discussed with neurology ( Dr Lorraine Lax)  who recommend patient to be treated with supportive care, to follow recommendation from psychiatric and to call neurology back if patient develop hyperthermia, worsening symptoms and worsening CK levels. Neurology also recommend to monitor patient on CIWA protocol. -Monitor patient in the stepdown unit, monitor for hypothermia, tachycardia, hypertension. -Monitor electrolytes, Mg and phosphorus level.  -Stop Haldol and trazodone. -If patient is discharged home he will need home health nurse to monitor intake of medications at home.  2-Dysphagia: Patient with, masklike face.  Suspect related to medications EPS. Treated with Benadryl.  Supportive care. Speech evaluation.  3-oral thrush; Will order nystatin  4-Bipolar; Psych consult for medications management.  Stop Haldol and  Trazodone.  5-Rhabdo: Elevation of CK level. Continue with IV fluids.  Monitor CK daily.  6-Hypomagnesemia; IV magnesium ordered.   DVT Prophylaxis: Lovenox Code Status: Full code Family Communication: Care discussed with mother over the phone Disposition Plan: Admit to the stepdown unit to monitor for hypotension, hypothermia, tachycardia arrhythmia consider for NMS versus EPS.    Time spent:75 minutes.   Elmarie Shiley MD [facility]   03/12/2019, 6:13 PM

## 2019-03-13 ENCOUNTER — Other Ambulatory Visit: Payer: Self-pay

## 2019-03-13 DIAGNOSIS — R259 Unspecified abnormal involuntary movements: Secondary | ICD-10-CM

## 2019-03-13 DIAGNOSIS — R29818 Other symptoms and signs involving the nervous system: Secondary | ICD-10-CM

## 2019-03-13 DIAGNOSIS — G9340 Encephalopathy, unspecified: Secondary | ICD-10-CM | POA: Diagnosis present

## 2019-03-13 DIAGNOSIS — F3130 Bipolar disorder, current episode depressed, mild or moderate severity, unspecified: Secondary | ICD-10-CM

## 2019-03-13 DIAGNOSIS — F319 Bipolar disorder, unspecified: Secondary | ICD-10-CM

## 2019-03-13 DIAGNOSIS — R1310 Dysphagia, unspecified: Secondary | ICD-10-CM

## 2019-03-13 LAB — COMPREHENSIVE METABOLIC PANEL
ALT: 36 U/L (ref 0–44)
AST: 31 U/L (ref 15–41)
Albumin: 2.5 g/dL — ABNORMAL LOW (ref 3.5–5.0)
Alkaline Phosphatase: 151 U/L — ABNORMAL HIGH (ref 38–126)
Anion gap: 9 (ref 5–15)
BUN: <5 mg/dL — ABNORMAL LOW (ref 6–20)
CO2: 20 mmol/L — ABNORMAL LOW (ref 22–32)
Calcium: 7.8 mg/dL — ABNORMAL LOW (ref 8.9–10.3)
Chloride: 108 mmol/L (ref 98–111)
Creatinine, Ser: 0.76 mg/dL (ref 0.61–1.24)
GFR calc Af Amer: >60 mL/min (ref >60)
GFR calc non Af Amer: >60 mL/min (ref >60)
Glucose, Bld: 70 mg/dL (ref 70–99)
Potassium: 3.5 mmol/L (ref 3.5–5.1)
Sodium: 137 mmol/L (ref 135–145)
Total Bilirubin: 1 mg/dL (ref 0.3–1.2)
Total Protein: 5.2 g/dL — ABNORMAL LOW (ref 6.5–8.1)

## 2019-03-13 LAB — CBC
HCT: 36 % — ABNORMAL LOW (ref 39.0–52.0)
Hemoglobin: 11.2 g/dL — ABNORMAL LOW (ref 13.0–17.0)
MCH: 26.4 pg (ref 26.0–34.0)
MCHC: 31.1 g/dL (ref 30.0–36.0)
MCV: 84.7 fL (ref 80.0–100.0)
Platelets: 468 10*3/uL — ABNORMAL HIGH (ref 150–400)
RBC: 4.25 MIL/uL (ref 4.22–5.81)
RDW: 14.7 % (ref 11.5–15.5)
WBC: 7.2 10*3/uL (ref 4.0–10.5)
nRBC: 0 % (ref 0.0–0.2)

## 2019-03-13 LAB — CK: Total CK: 662 U/L — ABNORMAL HIGH (ref 49–397)

## 2019-03-13 LAB — PHOSPHORUS: Phosphorus: 3 mg/dL (ref 2.5–4.6)

## 2019-03-13 LAB — MAGNESIUM: Magnesium: 2.2 mg/dL (ref 1.7–2.4)

## 2019-03-13 MED ORDER — SODIUM CHLORIDE 0.9 % IV SOLN
INTRAVENOUS | Status: DC
  Administered 2019-03-13 – 2019-03-19 (×10): via INTRAVENOUS

## 2019-03-13 NOTE — ED Notes (Addendum)
Transporting pt and all belongs onto floor 4 W 1436

## 2019-03-13 NOTE — Consult Note (Signed)
[facility] Face-to-Face Psychiatry Consult   Reason for Consult:  EPS Referring Physician:  Dr Frederic Jericho Patient Identification: Carl Carey MRN:  [id number] Principal Diagnosis: EPS Diagnosis:  Active Problems:   Parkinsonian features   Dysphagia   Bipolar 1 disorder ([place])   Extrapyramidal symptom   Rhabdomyolysis   NMS (neuroleptic malignant syndrome)   Encephalopathy acute   Total Time spent with patient: 1 hour  Subjective:   Carl Carey is a 38 y.o. male patient admitted with EPS.  "I'm not too good." Reports he shoulders hurt, reports difficulty eating and sleeping.   Patient seen and evaluated in person by this provider.  Patient stutters when he talks, not sure if this is his norm.  Anxious on presentation related to the side effects of his medications resulting in stiffness in he neck and jaw.  Recent admission for EPS related to antipsychotic use.  No suicidal/homicidal ideations, hallucinations, and substance abuse.  HPI per MD:   Carl Carey is a 38 y.o. male past medical history significant for bipolar disorder, Parkinsonism from antipsychotics in the past,  diabetes, EPS from medications. who presents to the emergency department with tremors.  Patient was seen in the ED on 03/11/19/2020 for presumed seizures like activities, EMS was called out yesterday for continued tremors.  Patient had a recent admission on 11/04 for EPS as well as rhabdo.  During prior hospitalization patient was on Abilify 5, Haldol, trazodone, Cogentin and possibly Celexa.  Abilify 5 was discontinued, his Haldol dose was decreased to 10 mg at at bedtime from 15 mg and Celexa was also discontinued.  During prior hospitalization patient received Benadryl and clonazepam with improvement of his symptoms.  During prior admission his CK level was 1165>>> 447 at the time of discharge.  EMS was called out again today due to worsening tremors/questionable seizure-like activity times  3 days.  ED physician spoke  with Dr. Dwyane Dee from Psych  who recommends patient to be admitted to medicine for IV hydration, to stop all antipsychotics, continue Ativan and Benadryl.  Per psych patient might be in NMS.   Past Psychiatric History: bipolar d/o  Risk to Self:  none Risk to Others:  none Prior Inpatient Therapy:  yes Prior Outpatient Therapy:  yes  Past Medical History:  Past Medical History:  Diagnosis Date  . Bipolar 1 disorder ([place])   . Diabetes ([place])   . Malnutrition ([place])   . Panic attack   . Parkinsonism [facility])     Past Surgical History:  Procedure Laterality Date  . BIOPSY  01/21/2019   Procedure: BIOPSY;  Surgeon: Otis Brace, MD;  Location: [place];  Service: Gastroenterology;;  . COLONOSCOPY WITH PROPOFOL N/A 01/21/2019   Procedure: COLONOSCOPY WITH PROPOFOL;  Surgeon: Otis Brace, MD;  Location: [place];  Service: Gastroenterology;  Laterality: N/A;  . ESOPHAGOGASTRODUODENOSCOPY (EGD) WITH PROPOFOL N/A 01/21/2019   Procedure: ESOPHAGOGASTRODUODENOSCOPY (EGD) WITH PROPOFOL;  Surgeon: Otis Brace, MD;  [facility];  Service: Gastroenterology;  Laterality: N/A;  . MULTIPLE EXTRACTIONS WITH ALVEOLOPLASTY N/A 07/29/2014   Procedure: Extraction of tooth #'s 1,15,16,17,18 with alveoloplasty and gross debridement of teeth;  Surgeon: Lenn Cal, DDS;  Location: [place];  Service: Oral Surgery;  Laterality: N/A;  . NECK SURGERY     Family History:  Family History  Problem Relation Age of Onset  . Hypertension Maternal Grandmother    Family Psychiatric  History: none Social History:  Social History   Substance and Sexual Activity  Alcohol Use Yes  Comment: socially     Social History   Substance and Sexual Activity  Drug Use No    Social History   Socioeconomic History  . Marital status: Single    Spouse name: Not on file  . Number of children: Not on file  . Years of education: Not on file  . Highest education level: Not on file   Occupational History  . Not on file  Social Needs  . Financial resource strain: Not on file  . Food insecurity    Worry: Not on file    Inability: Not on file  . Transportation needs    Medical: Not on file    Non-medical: Not on file  Tobacco Use  . Smoking status: Current Some Day Smoker    Types: Cigarettes  . Smokeless tobacco: Never Used  Substance and Sexual Activity  . Alcohol use: Yes    Comment: socially  . Drug use: No  . Sexual activity: Not Currently  Lifestyle  . Physical activity    Days per week: Not on file    Minutes per session: Not on file  . Stress: Not on file  Relationships  . Social Herbalist on phone: Not on file    Gets together: Not on file    Attends religious service: Not on file    Active member of club or organization: Not on file    Attends meetings of clubs or organizations: Not on file    Relationship status: Not on file  Other Topics Concern  . Not on file  Social History Narrative  . Not on file   Additional Social History:    Allergies:  No Known Allergies  Labs:  Results for orders placed or performed during the hospital encounter of 03/12/19 (from the past 48 hour(s))  Carbamazepine (Tegretol) Level (if patient is taking this medication)     Status: Abnormal   Collection Time: 03/12/19 11:45 AM  Result Value Ref Range   Carbamazepine Lvl <2.0 (L) 4.0 - 12.0 ug/mL    Comment: Performed at [facility], [address] [place], [place] [address]  CBG monitoring, ED     Status: None   Collection Time: 03/12/19 11:50 AM  Result Value Ref Range   Glucose-Capillary 93 70 - 99 mg/dL  Basic metabolic panel     Status: Abnormal   Collection Time: 03/12/19 11:50 AM  Result Value Ref Range   Sodium 137 135 - 145 mmol/L   Potassium 3.8 3.5 - 5.1 mmol/L   Chloride 106 98 - 111 mmol/L   CO2 21 (L) 22 - 32 mmol/L   Glucose, Bld 105 (H) 70 - 99 mg/dL   BUN <5 (L) 6 - 20 mg/dL   Creatinine, Ser 0.69 0.61 - 1.24  mg/dL   Calcium 8.9 8.9 - 10.3 mg/dL   GFR calc non Af Amer >60 >60 mL/min   GFR calc Af Amer >60 >60 mL/min   Anion gap 10 5 - 15    Comment: Performed at [facility], [place] [address], [place], [place] [address]  CBC with Differential     Status: Abnormal   Collection Time: 03/12/19 11:50 AM  Result Value Ref Range   WBC 10.5 4.0 - 10.5 K/uL   RBC 5.05 4.22 - 5.81 MIL/uL   Hemoglobin 13.3 13.0 - 17.0 g/dL   HCT 41.8 39.0 - 52.0 %   MCV 82.8 80.0 - 100.0 fL   MCH 26.3  26.0 - 34.0 pg   MCHC 31.8 30.0 - 36.0 g/dL   RDW 14.6 11.5 - 15.5 %   Platelets 586 (H) 150 - 400 K/uL   nRBC 0.0 0.0 - 0.2 %   Neutrophils Relative % 85 %   Neutro Abs 8.9 (H) 1.7 - 7.7 K/uL   Lymphocytes Relative 7 %   Lymphs Abs 0.7 0.7 - 4.0 K/uL   Monocytes Relative 7 %   Monocytes Absolute 0.8 0.1 - 1.0 K/uL   Eosinophils Relative 0 %   Eosinophils Absolute 0.0 0.0 - 0.5 K/uL   Basophils Relative 0 %   Basophils Absolute 0.0 0.0 - 0.1 K/uL   Immature Granulocytes 1 %   Abs Immature Granulocytes 0.09 (H) 0.00 - 0.07 K/uL    Comment: Performed at [facility], [place] [address], [place], [place] [address]  CK     Status: Abnormal   Collection Time: 03/12/19 11:50 AM  Result Value Ref Range   Total CK 788 (H) 49 - 397 U/L    Comment: Performed at [facility], [place] [address], [place], [place] [address]  Magnesium     Status: Abnormal   Collection Time: 03/12/19 11:50 AM  Result Value Ref Range   Magnesium 1.6 (L) 1.7 - 2.4 mg/dL    Comment: Performed at [facility], [place] [address], [place], [place] [address]  Phosphorus     Status: None   Collection Time: 03/12/19 11:50 AM  Result Value Ref Range   Phosphorus 3.4 2.5 - 4.6 mg/dL    Comment: Performed at [facility], [place] [address] [place], [place] [address]  Urinalysis, Routine w reflex microscopic     Status: Abnormal   Collection Time: 03/12/19  1:57 PM   Result Value Ref Range   Color, Urine YELLOW YELLOW   APPearance CLEAR CLEAR   Specific Gravity, Urine 1.019 1.005 - 1.030   pH 5.0 5.0 - 8.0   Glucose, UA NEGATIVE NEGATIVE mg/dL   Hgb urine dipstick NEGATIVE NEGATIVE   Bilirubin Urine NEGATIVE NEGATIVE   Ketones, ur 20 (A) NEGATIVE mg/dL   Protein, ur NEGATIVE NEGATIVE mg/dL   Nitrite NEGATIVE NEGATIVE   Leukocytes,Ua NEGATIVE NEGATIVE    Comment: Performed at [place] [address], [place], [place] [address]  Phosphorus     Status: None   Collection Time: 03/12/19 10:48 PM  Result Value Ref Range   Phosphorus 2.8 2.5 - 4.6 mg/dL    Comment: Performed at [facility], [place] [address], [place], [place] [address]  Hepatic function panel     Status: Abnormal   Collection Time: 03/12/19 10:48 PM  Result Value Ref Range   Total Protein 5.5 (L) 6.5 - 8.1 g/dL   Albumin 2.6 (L) 3.5 - 5.0 g/dL   AST 36 15 - 41 U/L   ALT 38 0 - 44 U/L   Alkaline Phosphatase 165 (H) 38 - 126 U/L   Total Bilirubin 0.9 0.3 - 1.2 mg/dL   Bilirubin, Direct 0.1 0.0 - 0.2 mg/dL   Indirect Bilirubin 0.8 0.3 - 0.9 mg/dL    Comment: Performed at [facility], [place] [address] [place], [place] [address]  TSH     Status: None   Collection Time: 03/12/19 10:48 PM  Result Value Ref Range   TSH 0.633 0.350 - 4.500 uIU/mL    Comment: Performed by a 3rd Generation assay with a functional sensitivity of <=  0.01 uIU/mL. Performed at [facility], [place] [address], [place], [place] [address]   CK     Status: Abnormal   Collection Time: 03/13/19  5:35 AM  Result Value Ref Range   Total CK 662 (H) 49 - 397 U/L    Comment: Performed at [facility], [place] [address], [place], [place] [address]  Magnesium     Status: None   Collection Time: 03/13/19  5:35 AM  Result Value Ref Range   Magnesium 2.2 1.7 - 2.4 mg/dL    Comment: Performed at [facility],  [place] [address], [place], [place] [address]  Phosphorus     Status: None   Collection Time: 03/13/19  5:35 AM  Result Value Ref Range   Phosphorus 3.0 2.5 - 4.6 mg/dL    Comment: Performed at [facility], [place] [address] [place], [place] [address]  CBC     Status: Abnormal   Collection Time: 03/13/19  5:35 AM  Result Value Ref Range   WBC 7.2 4.0 - 10.5 K/uL   RBC 4.25 4.22 - 5.81 MIL/uL   Hemoglobin 11.2 (L) 13.0 - 17.0 g/dL   HCT 36.0 (L) 39.0 - 52.0 %   MCV 84.7 80.0 - 100.0 fL   MCH 26.4 26.0 - 34.0 pg   MCHC 31.1 30.0 - 36.0 g/dL   RDW 14.7 11.5 - 15.5 %   Platelets 468 (H) 150 - 400 K/uL   nRBC 0.0 0.0 - 0.2 %    Comment: Performed at [facility], [place] [address] [place], [place] [address]  Comprehensive metabolic panel in am     Status: Abnormal   Collection Time: 03/13/19  5:40 AM  Result Value Ref Range   Sodium 137 135 - 145 mmol/L   Potassium 3.5 3.5 - 5.1 mmol/L   Chloride 108 98 - 111 mmol/L   CO2 20 (L) 22 - 32 mmol/L   Glucose, Bld 70 70 - 99 mg/dL   BUN <5 (L) 6 - 20 mg/dL   Creatinine, Ser 0.76 0.61 - 1.24 mg/dL   Calcium 7.8 (L) 8.9 - 10.3 mg/dL   Total Protein 5.2 (L) 6.5 - 8.1 g/dL   Albumin 2.5 (L) 3.5 - 5.0 g/dL   AST 31 15 - 41 U/L   ALT 36 0 - 44 U/L   Alkaline Phosphatase 151 (H) 38 - 126 U/L   Total Bilirubin 1.0 0.3 - 1.2 mg/dL   GFR calc non Af Amer >60 >60 mL/min   GFR calc Af Amer >60 >60 mL/min   Anion gap 9 5 - 15    Comment: Performed at [facility], [place] [address] [place], [place] [address]    Current Facility-Administered Medications  Medication Dose Route Frequency Provider Last Rate Last Dose  . 0.9 %  sodium chloride infusion   Intravenous Continuous Regalado, Belkys A, MD   Stopped at 03/13/19 0654  . 0.9 %  sodium chloride infusion   Intravenous Continuous Rai, Ripudeep K, MD 75 mL/hr at 03/13/19 1500    . acetaminophen (TYLENOL) tablet 650 mg  650 mg Oral Q6H PRN  Regalado, Belkys A, MD       Or  . acetaminophen (TYLENOL) suppository 650 mg  650 mg Rectal Q6H PRN Regalado, Belkys A, MD      . diphenhydrAMINE (BENADRYL) injection 25 mg  25 mg Intravenous Q8H Regalado, Belkys A, MD   25 mg at 03/13/19 1301  .  enoxaparin (LOVENOX) injection 40 mg  40 mg Subcutaneous Q24H Regalado, Belkys A, MD   40 mg at 03/13/19 0938  . folic acid injection 1 mg  1 mg Intravenous Daily Regalado, Belkys A, MD   1 mg at 03/13/19 1024  . LORazepam (ATIVAN) injection 1 mg  1 mg Intravenous Q6H PRN Regalado, Belkys A, MD   1 mg at 03/12/19 1544  . LORazepam (ATIVAN) tablet 1-4 mg  1-4 mg Oral Q1H PRN Regalado, Belkys A, MD       Or  . LORazepam (ATIVAN) injection 1-4 mg  1-4 mg Intravenous Q1H PRN Regalado, Belkys A, MD   2 mg at 03/13/19 0811  . multivitamin with minerals tablet 1 tablet  1 tablet Oral Daily Regalado, Belkys A, MD   Stopped at 03/13/19 0939  . nystatin (MYCOSTATIN) 100000 UNIT/ML suspension 500,000 Units  5 mL Oral QID Regalado, Belkys A, MD   500,000 Units at 03/13/19 1502  . ondansetron (ZOFRAN) tablet 4 mg  4 mg Oral Q6H PRN Regalado, Belkys A, MD       Or  . ondansetron (ZOFRAN) injection 4 mg  4 mg Intravenous Q6H PRN Regalado, Belkys A, MD      . thiamine (VITAMIN B-1) tablet 100 mg  100 mg Oral Daily Regalado, Belkys A, MD   Stopped at 03/13/19 1941    Musculoskeletal: Strength & Muscle Tone: decreased Gait & Station: did not witness Patient leans: N/A  Psychiatric Specialty Exam: Physical Exam  Nursing note and vitals reviewed. Constitutional: He is oriented to person, place, and time. He appears well-developed and well-nourished.  HENT:  Head: Normocephalic.  Neck: Normal range of motion.  Respiratory: Effort normal.  Musculoskeletal: Normal range of motion.  Neurological: He is alert and oriented to person, place, and time.  Psychiatric: His behavior is normal. Judgment and thought content normal. His mood appears anxious. His affect is  blunt. Cognition and memory are normal.  stutters    Review of Systems  Psychiatric/Behavioral: The patient is nervous/anxious.   All other systems reviewed and are negative.   Blood pressure 125/79, pulse 84, temperature 98.4 F (36.9 C), temperature source Oral, resp. rate (!) 22, height 5' 8"  (1.727 m), weight 58.4 kg, SpO2 98 %.Body mass index is 19.58 kg/m.  General Appearance: Casual  Eye Contact:  Good  Speech:  Normal Rate with stuttering  Volume:  Normal  Mood:  Anxious  Affect:  Blunt  Thought Process:  Coherent  Orientation:  Full (Time, Place, and Person)  Thought Content:  WDL and Logical  Suicidal Thoughts:  No  Homicidal Thoughts:  No  Memory:  Immediate;   Good Recent;   Good Remote;   Good  Judgement:  Good  Insight:  Good  Psychomotor Activity:  Decreased  Concentration:  Concentration: Good and Attention Span: Good  Recall:  Good  Fund of Knowledge:  Good  Language:  Good  Akathisia:  No  Handed:  Right  AIMS (if indicated):     Assets:  Leisure Time Resilience  ADL's:  Impaired  Cognition:  WNL  Sleep:      38 yo male presented with EPS, history of bipolar d/o.  Reports anxiety related to his symptoms of difficulty eating, stiffness, etc.  Hold psychiatric medications until side effects resolve.  Then reconsult psych for medication recommendations.  Treatment Plan Summary: Bipolar disorder, depressed, moderate: -Hold all antipsychotics and psychiatric medications until symptoms resolve -Continue Benadryl 25 mg every six hours -Recommend Ativan  1 mg every 6 hours  -Continue Ativan taper  Disposition: No evidence of imminent risk to self or others at present.   Supportive therapy provided about ongoing stressors.  Waylan Boga, NP 03/13/2019 4:00 PM

## 2019-03-13 NOTE — Progress Notes (Signed)
Pt continues to have mild tremors when up to  [facility]. Pt up to [facility] with one assist had large BM. SRP, RN

## 2019-03-13 NOTE — Evaluation (Signed)
Clinical/Bedside Swallow Evaluation Patient Details  Name: Carl Carey MRN: [id number] Date of Birth: May 23, 1980  Today's Date: 03/13/2019 Time: SLP Start Time (ACUTE ONLY): 0950 SLP Stop Time (ACUTE ONLY): 1030 SLP Time Calculation (min) (ACUTE ONLY): 40 min  Past Medical History:  Past Medical History:  Diagnosis Date  . Bipolar 1 disorder ([place])   . Diabetes ([place])   . Malnutrition ([place])   . Panic attack   . Parkinsonism [facility])    Past Surgical History:  Past Surgical History:  Procedure Laterality Date  . BIOPSY  01/21/2019   Procedure: BIOPSY;  Surgeon: Otis Brace, MD;  Location: [place];  Service: Gastroenterology;;  . COLONOSCOPY WITH PROPOFOL N/A 01/21/2019   Procedure: COLONOSCOPY WITH PROPOFOL;  Surgeon: Otis Brace, MD;  Location: [place];  Service: Gastroenterology;  Laterality: N/A;  . ESOPHAGOGASTRODUODENOSCOPY (EGD) WITH PROPOFOL N/A 01/21/2019   Procedure: ESOPHAGOGASTRODUODENOSCOPY (EGD) WITH PROPOFOL;  Surgeon: Otis Brace, MD;  [facility];  Service: Gastroenterology;  Laterality: N/A;  . MULTIPLE EXTRACTIONS WITH ALVEOLOPLASTY N/A 07/29/2014   Procedure: Extraction of tooth #'s 1,15,16,17,18 with alveoloplasty and gross debridement of teeth;  Surgeon: Lenn Cal, DDS;  Location: [place];  Service: Oral Surgery;  Laterality: N/A;  . NECK SURGERY     HPI:  Carl Carey is a 38 y.o. male past medical history significant for bipolar disorder, Parkinsonism from antipsychotics in the past,  diabetes, EPS from medications. who presents to the emergency department with tremors.  Patient was seen in the ED on 03/11/19/2020 for presumed seizures like activities, EMS was called out yesterday for continued tremors.  Patient had a recent admission on 11/04 for EPS as well as rhabdo.  During prior hospitalization patient was on Abilify 5, Haldol, trazodone, Cogentin and possibly Celexa.  Abilify 5 was discontinued, his Haldol dose was  decreased to 10 mg at at bedtime from 15 mg and Celexa was also discontinued.  During prior hospitalization patient received Benadryl and clonazepam with improvement of his symptoms per Md note.  He has been sen by SLP in the past for swallow evaluation with recommendation for regular/thin.  Pt has a signfiicant stutter per prior BSE. Pt was admitted with tremors, rigidity, HTN and tachycardia - concern for NMS or EPS.  Swallow evaluation ordered.CXR negative as well as brain CT.Pt is afebrile and WBC is normal.   Assessment / Plan / Recommendation Clinical Impression  Pt's swallow function clinically is consistent with prior exam one week ago.    No focal CN deficit apparent but he does present with stutter and tremorous movement.  He is able to sequentially swallow four ounces of juice via straw without s/s of aspiration.  Rotary mastication subjectively present but his ability to open oral cavity fully is limited.  He is slow to masticate and has mild oral residuals on right - reports difficulty masticating on the right.  Oral suction or liquid consumption effective to clear residuals.  Pharyngeal swallow appears clinically intact.  Given his oral difficulties, recommend he have a mechanical soft thin diet *allowing soft whole sandwiches.  Suspect pt has oral candidiasis and RN reports he is being treated for it at this time.  Educated pt to recommendations and provided him with [place] sandwich and soda after testing - setting up for him to eat Independently.  He may require intermittent assist to eat due to his tremors, however did well with soft sandwich.   SLP Visit Diagnosis: Dysphagia, oral phase (R13.11)    Aspiration Risk  Mild aspiration risk;Risk for inadequate nutrition/hydration    Diet Recommendation Dysphagia 3 (Mech soft);Thin liquid   Liquid Administration via: Cup;Straw Medication Administration: (as tolerated, with puree if problematic) Supervision: Staff to assist with self  feeding(prn) Compensations: Slow rate;Small sips/bites;Other (Comment)(oral suction after meals) Postural Changes: Remain upright for at least 30 minutes after po intake;Seated upright at 90 degrees    Other  Recommendations   n/a  Follow up Recommendations   n/a     Frequency and Duration     n/a       Prognosis   n/a     Swallow Study   General Date of Onset: 03/13/19 HPI: Carl Carey is a 38 y.o. male past medical history significant for bipolar disorder, Parkinsonism from antipsychotics in the past,  diabetes, EPS from medications. who presents to the emergency department with tremors.  Patient was seen in the ED on 03/11/19/2020 for presumed seizures like activities, EMS was called out yesterday for continued tremors.  Patient had a recent admission on 11/04 for EPS as well as rhabdo.  During prior hospitalization patient was on Abilify 5, Haldol, trazodone, Cogentin and possibly Celexa.  Abilify 5 was discontinued, his Haldol dose was decreased to 10 mg at at bedtime from 15 mg and Celexa was also discontinued.  During prior hospitalization patient received Benadryl and clonazepam with improvement of his symptoms per Md note.  He has been sen by SLP in the past for swallow evaluation with recommendation for regular/thin.  Pt has a signfiicant stutter per prior BSE. Pt was admitted with tremors, rigidity, HTN and tachycardia - concern for NMS or EPS.  Swallow evaluation ordered.CXR negative as well as brain CT.Pt is afebrile and WBC is normal. Type of Study: Bedside Swallow Evaluation Diet Prior to this Study: NPO Temperature Spikes Noted: No Respiratory Status: Room air History of Recent Intubation: No Behavior/Cognition: Alert;Cooperative;Requires cueing Oral Cavity Assessment: Dried secretions(white coating, ? consistent with oral candidiasis, he is on a antifungal) Oral Care Completed by SLP: No Oral Cavity - Dentition: Adequate natural dentition Vision: Functional for  self-feeding Patient Positioning: Upright in bed Baseline Vocal Quality: Normal Volitional Cough: Strong Volitional Swallow: Able to elicit(delayed)    Oral/Motor/Sensory Function Overall Oral Motor/Sensory Function: Other (comment)(tremorous movement but no weakness observed)   Ice Chips Ice chips: Within functional limits Presentation: Spoon;Self Fed   Thin Liquid Thin Liquid: Within functional limits Presentation: Straw;Self Fed    Nectar Thick Nectar Thick Liquid: Not tested   Honey Thick Honey Thick Liquid: Not tested   Puree Puree: Impaired Presentation: Spoon;Self Fed Oral Phase Functional Implications: Oral holding Other Comments: clinical appears with minimal delay to oral transit and swallow   Solid     Solid: Impaired Presentation: Self Fed Oral Phase Functional Implications: Oral holding;Impaired mastication;Right lateral sulci pocketing Other Comments: minimal oral residuals on the right      Macario Golds 03/13/2019,10:40 AM  Luanna Salk, MS [facility] SLP Acute Rehab Services Pager [phone] Office [phone]

## 2019-03-13 NOTE — Progress Notes (Signed)
[facility]                                                                              Patient Demographics  Carl Carey, is a 38 y.o. male, DOB - October 09, 1980, JKK:[id number]  Admit date - 03/12/2019   Admitting Physician No admitting provider for patient encounter.  Outpatient Primary MD for the patient is System, Pcp Not In  Outpatient specialists:   LOS - 1  days   Medical records reviewed and are as summarized below:    Chief Complaint  Patient presents with   Tremors       Brief summary   Patient is a 38 year old male with history of bipolar disorder, parkinsonism from antipsychotics in the past, diabetes mellitus, EPS from the medications presented to ED with tremors.  Patient was seen in ED on 11/8 for presumed seizure-like activities.  EMS was called for continued tremors.  He had recent admission on 11/4 for EPS and rhabdomyolysis.  During prior hospitalization, patient was on Abilify Haldol trazodone, Celexa and Cogentin.  Abilify and Celexa was discontinued, Haldol was decreased.  Patient received Benadryl, clonazepam with improvement of his symptoms. ED physician discussed with Dr. Dwyane Dee (psychiatry), recommended IV hydration, stop all antipsychotics and continue Ativan and Benadryl.  In ED, patient was found to be alert, rigidity of the upper and lower extremities, tremors.  Patient's mother did not know what medications patient was currently taking.  Assessment & Plan    Possible NMS versus extrapyramidal symptoms -Patient presented with tremors, rigidity, tachycardia, tachypnea, hypertensive.  Temp 37 F. -Psychiatry was consulted in ED, recommended hold all psych meds and to treat patient with IV Ativan, Benadryl for presumed NMS.  Will await further psychiatry recommendations, consult has been placed. -Admitting physician, Dr. Tyrell Antonio also discussed with neurology, Dr. Lorraine Lax who recommended supportive care and follow psychiatry  recommendations.  Neurology also recommended to monitor patient on CIWA protocol. -Trazodone, Haldol discontinued. -Patient is alert and oriented, speaks monotonous, singing tone  Acute mild rhabdomyolysis -Mild elevation of CK 788 at the time of admission, improving 662 today, no renal insufficiency -Continue IV fluid hydration  Dysphagia -Swallow evaluation completed, started on dysphagia 3 diet  Oral thrush -Continue nystatin  Bipolar disorder -As #1, will follow psychiatry recommendations  Hypomagnesemia Improved, 2.2 after placement  Code Status: Full CODE STATUS DVT Prophylaxis:  Lovenox  Family Communication: Discussed all imaging results, lab results, explained to the patient    Disposition Plan: Remains inpatient, still tremors, awaiting psychiatry recommendations for adjustment of medications.  When discharged, will need home health nurse for medication management  Time Spent in minutes 25 minutes  Procedures:  None  Consultants:   Neurology via phone consultation Psychiatry  Antimicrobials:   Anti-infectives (From admission, onward)   None          Medications  Scheduled Meds:  diphenhydrAMINE  25 mg Intravenous Q8H   enoxaparin (LOVENOX) injection  40 mg Subcutaneous Z16R   folic acid  1 mg Intravenous Daily   multivitamin with minerals  1 tablet Oral Daily   nystatin  5 mL Oral QID   thiamine  100  mg Oral Daily   Continuous Infusions:  sodium chloride Stopped (03/13/19 0654)   sodium chloride 75 mL/hr at 03/13/19 0653   PRN Meds:.acetaminophen **OR** acetaminophen, LORazepam, LORazepam **OR** LORazepam, ondansetron **OR** ondansetron (ZOFRAN) IV      Subjective:   Carl Carey was seen and examined today.  Alert and oriented, tremors, speaks in a monotonous, flat tone.   Patient denies dizziness, chest pain, shortness of breath, abdominal pain, N/V/D/C, new weakness, numbess, tingling.   Objective:   Vitals:   03/13/19  0830 03/13/19 0900 03/13/19 0930 03/13/19 1000  BP: 108/65 106/64 110/68 134/88  Pulse: 79 69 90 88  Resp: 17 16 16  (!) 28  Temp:      TempSrc:      SpO2: 97% 98% 100% 92%    Intake/Output Summary (Last 24 hours) at 03/13/2019 1110 Last data filed at 03/12/2019 2129 Gross per 24 hour  Intake 3600 ml  Output --  Net 3600 ml     Wt Readings from Last 3 Encounters:  03/11/19 64.2 kg  03/07/19 64.2 kg  02/26/19 68 kg     Exam  General: Alert and oriented x 3, NAD, tremor  Eyes:   HEENT:  Atraumatic, normocephalic  Cardiovascular: S1 S2 auscultated, no murmurs, RRR  Respiratory: Clear to auscultation bilaterally, no wheezing, rales or rhonchi  Gastrointestinal: Soft, nontender, nondistended, + bowel sounds  Ext: no pedal edema bilaterally  Neuro: follows commands, tremors, strength 5/5 bilaterally UE and LE  Musculoskeletal: No digital cyanosis, clubbing  Skin: No rashes  Psych: Pleasant and cooperative   Data Reviewed:  I have personally reviewed following labs and imaging studies  Micro Results No results found for this or any previous visit (from the past 240 hour(s)).  Radiology Reports Ct Head Wo Contrast  Result Date: 03/11/2019 CLINICAL DATA:  Seizure EXAM: CT HEAD WITHOUT CONTRAST TECHNIQUE: Contiguous axial images were obtained from the base of the skull through the vertex without intravenous contrast. COMPARISON:  03/02/2019 FINDINGS: Brain: No evidence of acute infarction, hemorrhage, hydrocephalus, extra-axial collection or mass lesion/mass effect. Vascular: No hyperdense vessel or unexpected calcification. Skull: Normal. Negative for fracture or focal lesion. Sinuses/Orbits: No acute finding. Other: None. IMPRESSION: No acute intracranial pathology. Electronically Signed   By: Carl Carey M.D.   On: 03/11/2019 19:45   Ct Head Wo Contrast  Result Date: 03/02/2019 CLINICAL DATA:  Altered level of consciousness EXAM: CT HEAD WITHOUT CONTRAST  TECHNIQUE: Contiguous axial images were obtained from the base of the skull through the vertex without intravenous contrast. COMPARISON:  December 23, 2018 FINDINGS: Brain: Ventricles are normal in size and configuration. There is no intracranial mass, hemorrhage, extra-axial fluid collection, or midline shift. The brain parenchyma appears unremarkable. No acute infarct is demonstrable. Vascular: There is no hyperdense vessel. No vascular calcification evident. Skull: Bony calvarium appears intact. Sinuses/Orbits: There is mucosal thickening in several ethmoid air cells bilaterally. There are small retention cysts in the posterior left maxillary antrum. Other paranasal sinuses are clear. Orbits appear symmetric bilaterally. Other: Mastoid air cells are clear. IMPRESSION: Foci of paranasal sinus disease.  Study otherwise unremarkable. Electronically Signed   By: Lowella Grip III M.D.   On: 03/02/2019 10:46   Dg Chest Port 1 View  Result Date: 03/11/2019 CLINICAL DATA:  Seizure. EXAM: PORTABLE CHEST 1 VIEW COMPARISON:  Chest radiograph 03/05/2019 FINDINGS: The heart size and mediastinal contours are within normal limits. The lungs are clear. No pneumothorax or large pleural effusion. No acute finding in  the visualized skeleton. IMPRESSION: No evidence of active disease in the chest. Electronically Signed   By: Audie Pinto M.D.   On: 03/11/2019 20:36   Dg Chest Port 1 View  Result Date: 03/05/2019 CLINICAL DATA:  Fever EXAM: PORTABLE CHEST 1 VIEW COMPARISON:  02/24/2019 FINDINGS: There is a persistent hazy airspace opacity throughout the right lower lung zone, stable from prior study. There is no pneumothorax. No large pleural effusion. The heart size is stable. The lung volumes are somewhat low. There is no acute osseous abnormality. IMPRESSION: Persistent right lower lung zone airspace consolidation. Electronically Signed   By: Constance Holster M.D.   On: 03/05/2019 06:30   Dg Chest Portable 1  View  Result Date: 02/24/2019 CLINICAL DATA:  Cough, recent pneumonia EXAM: PORTABLE CHEST 1 VIEW COMPARISON:  01/15/2019 FINDINGS: Significantly improved heterogeneous airspace opacity of the right upper lobe, with some persistent opacity of the right lung base. The heart and mediastinum are normal. The osseous structures are unremarkable. IMPRESSION: Significantly improved heterogeneous airspace opacity of the right upper lobe, with some persistent opacity of the right lung base. Findings are consistent with improved infection. Electronically Signed   By: Carl Carey M.D.   On: 02/24/2019 19:29    Lab Data:  CBC: Recent Labs  Lab 03/11/19 1904 03/12/19 1150 03/13/19 0535  WBC 11.0* 10.5 7.2  NEUTROABS 9.4* 8.9*  --   HGB 13.2 13.3 11.2*  HCT 41.2 41.8 36.0*  MCV 81.9 82.8 84.7  PLT 599* 586* 562*   Basic Metabolic Panel: Recent Labs  Lab 03/11/19 1904 03/12/19 1150 03/12/19 2248 03/13/19 0535 03/13/19 0540  NA 136 137  --   --  137  K 3.2* 3.8  --   --  3.5  CL 102 106  --   --  108  CO2 22 21*  --   --  20*  GLUCOSE 124* 105*  --   --  70  BUN <5* <5*  --   --  <5*  CREATININE 1.08 0.69  --   --  0.76  CALCIUM 9.1 8.9  --   --  7.8*  MG  --  1.6*  --  2.2  --   PHOS  --  3.4 2.8 3.0  --    GFR: Estimated Creatinine Clearance: 115.9 mL/min (by C-G formula based on SCr of 0.76 mg/dL). Liver Function Tests: Recent Labs  Lab 03/12/19 2248 03/13/19 0540  AST 36 31  ALT 38 36  ALKPHOS 165* 151*  BILITOT 0.9 1.0  PROT 5.5* 5.2*  ALBUMIN 2.6* 2.5*   No results for input(s): LIPASE, AMYLASE in the last 168 hours. No results for input(s): AMMONIA in the last 168 hours. Coagulation Profile: No results for input(s): INR, PROTIME in the last 168 hours. Cardiac Enzymes: Recent Labs  Lab 03/11/19 1904 03/12/19 1150 03/13/19 0535  CKTOTAL 520* 788* 662*   BNP (last 3 results) No results for input(s): PROBNP in the last 8760 hours. HbA1C: No results for  input(s): HGBA1C in the last 72 hours. CBG: Recent Labs  Lab 03/11/19 1953 03/12/19 1150  GLUCAP 101* 93   Lipid Profile: No results for input(s): CHOL, HDL, LDLCALC, TRIG, CHOLHDL, LDLDIRECT in the last 72 hours. Thyroid Function Tests: Recent Labs    03/12/19 2248  TSH 0.633   Anemia Panel: No results for input(s): VITAMINB12, FOLATE, FERRITIN, TIBC, IRON, RETICCTPCT in the last 72 hours. Urine analysis:    Component Value Date/Time   COLORURINE YELLOW  03/12/2019 [place] 03/12/2019 1357   LABSPEC 1.019 03/12/2019 1357   PHURINE 5.0 03/12/2019 1357   [place] 03/12/2019 1357   HGBUR NEGATIVE 03/12/2019 1357   [place] 03/12/2019 1357   KETONESUR 20 (A) 03/12/2019 1357   PROTEINUR NEGATIVE 03/12/2019 1357   UROBILINOGEN 0.2 11/17/2014 1930   NITRITE NEGATIVE 03/12/2019 1357   LEUKOCYTESUR NEGATIVE 03/12/2019 1357     Kaylanie Capili M.D. [facility] 03/13/2019, 11:10 AM  Pager: [phone] Between 7am to 7pm - call Pager - [phone]  After 7pm go to [email or web address] - password [email or web address]  Call night coverage person covering after 7pm

## 2019-03-13 NOTE — ED Notes (Addendum)
Pt ambulated to BR with assistance. Pt had small BM with runny green appearance. This RN discovered bottle of Haldol medication in pts pants pocket. Medication was removed and placed out of pts reach in personal belongings bag

## 2019-03-13 NOTE — Progress Notes (Signed)
Pt admitted to 1437, VSs noted, denies pain, tremor noted. Pt able to ambulate with one assist to bed. Pt a/o and follows command with expressive aphasia noted. Pt request something to drink able to tolerate thin liquids without issues. SRP, RN

## 2019-03-13 NOTE — Progress Notes (Signed)
During final handoff of the shift, pt noted to have tremors in the right upper extremity. Pt states this is baseline, he take Parkinson meds at home, which are currently on hold. Night RN aware and will follow up with night attending. Pt c/o pain in neck area. Pt able to ambulate to [facility] with assistance. Loose BM noted mixed in urine. SRP,RN

## 2019-03-13 NOTE — ED Notes (Signed)
Pt refused bath.

## 2019-03-13 NOTE — ED Notes (Signed)
Consulted with Hospitalist Rai MD to consider changing pts bed request to telemetry.

## 2019-03-14 ENCOUNTER — Ambulatory Visit: Payer: Medicaid Other | Admitting: Nurse Practitioner

## 2019-03-14 DIAGNOSIS — R945 Abnormal results of liver function studies: Secondary | ICD-10-CM

## 2019-03-14 DIAGNOSIS — R7989 Other specified abnormal findings of blood chemistry: Secondary | ICD-10-CM

## 2019-03-14 LAB — CBC
HCT: 35.9 % — ABNORMAL LOW (ref 39.0–52.0)
Hemoglobin: 11.3 g/dL — ABNORMAL LOW (ref 13.0–17.0)
MCH: 26.5 pg (ref 26.0–34.0)
MCHC: 31.5 g/dL (ref 30.0–36.0)
MCV: 84.1 fL (ref 80.0–100.0)
Platelets: 463 10*3/uL — ABNORMAL HIGH (ref 150–400)
RBC: 4.27 MIL/uL (ref 4.22–5.81)
RDW: 14.6 % (ref 11.5–15.5)
WBC: 9.2 10*3/uL (ref 4.0–10.5)
nRBC: 0 % (ref 0.0–0.2)

## 2019-03-14 LAB — COMPREHENSIVE METABOLIC PANEL
ALT: 40 U/L (ref 0–44)
AST: 48 U/L — ABNORMAL HIGH (ref 15–41)
Albumin: 2.6 g/dL — ABNORMAL LOW (ref 3.5–5.0)
Alkaline Phosphatase: 168 U/L — ABNORMAL HIGH (ref 38–126)
Anion gap: 6 (ref 5–15)
BUN: <5 mg/dL — ABNORMAL LOW (ref 6–20)
CO2: 22 mmol/L (ref 22–32)
Calcium: 8.1 mg/dL — ABNORMAL LOW (ref 8.9–10.3)
Chloride: 109 mmol/L (ref 98–111)
Creatinine, Ser: 0.68 mg/dL (ref 0.61–1.24)
GFR calc Af Amer: >60 mL/min (ref >60)
GFR calc non Af Amer: >60 mL/min (ref >60)
Glucose, Bld: 101 mg/dL — ABNORMAL HIGH (ref 70–99)
Potassium: 3.6 mmol/L (ref 3.5–5.1)
Sodium: 137 mmol/L (ref 135–145)
Total Bilirubin: 0.2 mg/dL — ABNORMAL LOW (ref 0.3–1.2)
Total Protein: 5.3 g/dL — ABNORMAL LOW (ref 6.5–8.1)

## 2019-03-14 LAB — CK: Total CK: 397 U/L (ref 49–397)

## 2019-03-14 LAB — SARS CORONAVIRUS 2 (TAT 6-24 HRS): SARS Coronavirus 2: NEGATIVE

## 2019-03-14 MED ORDER — LOPERAMIDE HCL 2 MG PO CAPS
2.0000 mg | ORAL_CAPSULE | ORAL | Status: DC | PRN
  Administered 2019-03-14 – 2019-03-15 (×2): 2 mg via ORAL
  Filled 2019-03-14 (×2): qty 1

## 2019-03-14 MED ORDER — LORAZEPAM 2 MG/ML IJ SOLN: 1.0000 mg | Freq: Four times a day (QID) | INTRAMUSCULAR | Status: DC | PRN

## 2019-03-14 MED ORDER — FOLIC ACID 1 MG PO TABS
1.0000 mg | ORAL_TABLET | Freq: Every day | ORAL | Status: DC
  Administered 2019-03-15 – 2019-03-19 (×5): 1 mg via ORAL
  Filled 2019-03-14 (×5): qty 1

## 2019-03-14 MED ORDER — LORAZEPAM 2 MG/ML IJ SOLN
1.0000 mg | Freq: Four times a day (QID) | INTRAMUSCULAR | Status: DC
  Administered 2019-03-14: 1 mg via INTRAVENOUS
  Filled 2019-03-14: qty 1

## 2019-03-14 MED ORDER — PRIMIDONE 50 MG PO TABS
50.0000 mg | ORAL_TABLET | Freq: Three times a day (TID) | ORAL | Status: DC
  Administered 2019-03-14 – 2019-03-18 (×12): 50 mg via ORAL
  Filled 2019-03-14 (×14): qty 1

## 2019-03-14 MED ORDER — DIPHENHYDRAMINE HCL 50 MG/ML IJ SOLN
25.0000 mg | Freq: Four times a day (QID) | INTRAMUSCULAR | Status: DC
  Administered 2019-03-14 – 2019-03-16 (×7): 25 mg via INTRAVENOUS
  Filled 2019-03-14 (×7): qty 1

## 2019-03-14 NOTE — Plan of Care (Addendum)
Carl Carey is well-known to me due to prior inpatient care and recent consultation-  The patient is known to have episodic psychotic symptoms, however his case is complicated by the presence of tremor/parkinsonism, even involving his vocalizations and he is now speaking with a near continual stutter, and that has resolved in the past with acute treatment with clonazepam and Benadryl basically, treating him with a regimen for extrapyramidal symptoms.  When he was in the hospital he was discharged on Haldol decanoate, and Haldol was his only antipsychotic.  However he cannot tell me when his last Haldol Decanoate shot is and though we had felt that the excessive EPS/akathisia/tremors seen previously were the result of the combination of Abilify and haloperidol (which was not given to him in the hospital but rather an aftercare) it does appear that he has continuation of these abnormal movements even after the agents have been discontinued.  Again he has been diagnosed with parkinsonism before-the first documentation of his parkinsonism was in 2016.  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  Again he is well-known to me and our consultation team does not mind my writing orders for them given my familiarity with the case

## 2019-03-14 NOTE — Plan of Care (Signed)
  Problem: Education: Goal: Knowledge of General Education information will improve Description: Including pain rating scale, medication(s)/side effects and non-pharmacologic comfort measures Outcome: Progressing   Problem: Clinical Measurements: Goal: Diagnostic test results will improve Outcome: Progressing

## 2019-03-14 NOTE — Progress Notes (Signed)
PROGRESS NOTE    Carl Carey    Code Status: Full Code  AJG:[id number] DOB: May 12, 1980 DOA: 03/12/2019  PCP: System, Pcp Not In    Hospital Summary  Patient is a 38 year old male with history of bipolar disorder, parkinsonism from antipsychotics in the past, diabetes mellitus, EPS from the medications presented to ED with tremors.  Patient was seen in ED on 11/8 for presumed seizure-like activities.  EMS was called for continued tremors.  He had recent admission on 11/4 for EPS and rhabdomyolysis.  During prior hospitalization, patient was on Abilify Haldol trazodone, Celexa and Cogentin.  Abilify and Celexa was discontinued, Haldol was decreased.  Patient received Benadryl, clonazepam with improvement of his symptoms. ED physician discussed with Dr. Dwyane Dee (psychiatry), recommended IV hydration, stop all antipsychotics and continue Ativan and Benadryl.  In ED, patient was found to be alert, rigidity of the upper and lower extremities, tremors.  Patient's mother did not know what medications patient was currently taking.  Psych has been consulted  A & P   Active Problems:   Parkinsonian features   Dysphagia   Bipolar 1 disorder (HCC)   Extrapyramidal symptom   Rhabdomyolysis   NMS (neuroleptic malignant syndrome)   Encephalopathy acute   Concern for NMS versus extrapyramidal symptoms patient well-known to psychiatry.  Persistent stuttering on exam and muscular rigidity.  Was getting Benadryl every 8 hours and Ativan 1 mg as needed -Psych to change benzodiazepine to Mysoline -Benadryl changed to every 6 hours -We will continue Ativan 1 mg as needed every 6 hours with addition of Mysoline -Trazodone, Haldol discontinued -On CIWA protocol  Acute mild rhabdomyolysis resolved with IV fluids  Dysphagia on dysphagia 3 diet -SLP on board  Oral thrush HIV and HBV negative -Continue nystatin -HCV screen  Bipolar disorder -Appreciate psychiatry recommendations  Elevated alkaline  phosphatase mildly elevated AST 48, ALT 40 -RUQ ultrasound  Hypomagnesemia -Follow-up in a.m.   DVT prophylaxis: Lovenox Diet: Dysphagia 3 Family Communication: No family at bedside.  Discussed with patient Disposition Plan: Remains inpatient with tremors.  Pending clinical stability.  Will need home health nurse for medication management at discharge.  Possible DC in 1 to 2 days  Consultants  Psychiatry Phone consult with neurology    Subjective   Patient seen and examined sitting upright eating his breakfast this morning.  Complains that his symptoms are worse today and that he is having a very stiff neck as well as tremors in his upper extremities.  This is worse from yesterday he states.  Denies any other complaints  Objective   Vitals:   03/13/19 2205 03/14/19 0406 03/14/19 1259 03/14/19 1404  BP: 131/90 115/77 124/87   Pulse: 80 78 85   Resp: 16 20 19    Temp: 98.7 F (37.1 C) 98.6 F (37 C) 98.5 F (36.9 C)   TempSrc: Oral Oral Oral   SpO2: 99% 99% 99% 95%  Weight:      Height:        Intake/Output Summary (Last 24 hours) at 03/14/2019 1824 Last data filed at 03/14/2019 1600 Gross per 24 hour  Intake 3255.12 ml  Output 2950 ml  Net 305.12 ml   Filed Weights   03/13/19 1436  Weight: 58.4 kg    Examination:  Physical Exam Vitals signs and nursing note reviewed.  Constitutional:      Comments: Severe persistent stuttering Follows commands AOx3  HENT:     Head: Normocephalic and atraumatic.     Mouth/Throat:  Mouth: Mucous membranes are moist.  Eyes:     Extraocular Movements: Extraocular movements intact.  Neck:     Comments: Neck muscles rigid and hypertonic Cardiovascular:     Rate and Rhythm: Normal rate and regular rhythm.  Pulmonary:     Effort: Pulmonary effort is normal.     Breath sounds: Normal breath sounds.  Abdominal:     General: Abdomen is flat. There is no distension.  Musculoskeletal:        General: No tenderness.      Right lower leg: No edema.     Left lower leg: No edema.  Neurological:     Mental Status: He is alert and oriented to person, place, and time.     Comments: Pupils reactive to light Cogwheel rigidity noted to bilateral upper extremity  Psychiatric:     Comments: Anxious     Data Reviewed: I have personally reviewed following labs and imaging studies  CBC: Recent Labs  Lab 03/11/19 1904 03/12/19 1150 03/13/19 0535 03/14/19 0430  WBC 11.0* 10.5 7.2 9.2  NEUTROABS 9.4* 8.9*  --   --   HGB 13.2 13.3 11.2* 11.3*  HCT 41.2 41.8 36.0* 35.9*  MCV 81.9 82.8 84.7 84.1  PLT 599* 586* 468* 856*   Basic Metabolic Panel: Recent Labs  Lab 03/11/19 1904 03/12/19 1150 03/12/19 2248 03/13/19 0535 03/13/19 0540 03/14/19 0430  NA 136 137  --   --  137 137  K 3.2* 3.8  --   --  3.5 3.6  CL 102 106  --   --  108 109  CO2 22 21*  --   --  20* 22  GLUCOSE 124* 105*  --   --  70 101*  BUN <5* <5*  --   --  <5* <5*  CREATININE 1.08 0.69  --   --  0.76 0.68  CALCIUM 9.1 8.9  --   --  7.8* 8.1*  MG  --  1.6*  --  2.2  --   --   PHOS  --  3.4 2.8 3.0  --   --    GFR: Estimated Creatinine Clearance: 105.4 mL/min (by C-G formula based on SCr of 0.68 mg/dL). Liver Function Tests: Recent Labs  Lab 03/12/19 2248 03/13/19 0540 03/14/19 0430  AST 36 31 48*  ALT 38 36 40  ALKPHOS 165* 151* 168*  BILITOT 0.9 1.0 0.2*  PROT 5.5* 5.2* 5.3*  ALBUMIN 2.6* 2.5* 2.6*   No results for input(s): LIPASE, AMYLASE in the last 168 hours. No results for input(s): AMMONIA in the last 168 hours. Coagulation Profile: No results for input(s): INR, PROTIME in the last 168 hours. Cardiac Enzymes: Recent Labs  Lab 03/11/19 1904 03/12/19 1150 03/13/19 0535 03/14/19 0430  CKTOTAL 520* 788* 662* 397   BNP (last 3 results) No results for input(s): PROBNP in the last 8760 hours. HbA1C: No results for input(s): HGBA1C in the last 72 hours. CBG: Recent Labs  Lab 03/11/19 1953 03/12/19 1150   GLUCAP 101* 93   Lipid Profile: No results for input(s): CHOL, HDL, LDLCALC, TRIG, CHOLHDL, LDLDIRECT in the last 72 hours. Thyroid Function Tests: Recent Labs    03/12/19 2248  TSH 0.633   Anemia Panel: No results for input(s): VITAMINB12, FOLATE, FERRITIN, TIBC, IRON, RETICCTPCT in the last 72 hours. Sepsis Labs: Recent Labs  Lab 03/11/19 1954  LATICACIDVEN 0.9    Recent Results (from the past 240 hour(s))  SARS CORONAVIRUS 2 (TAT 6-24 HRS)  Nasopharyngeal Nasopharyngeal Swab     Status: None   Collection Time: 03/12/19  2:41 PM   Specimen: Nasopharyngeal Swab  Result Value Ref Range Status   SARS Coronavirus 2 NEGATIVE NEGATIVE Final    Comment: (NOTE) SARS-CoV-2 target nucleic acids are NOT DETECTED. The SARS-CoV-2 RNA is generally detectable in upper and lower respiratory specimens during the acute phase of infection. Negative results do not preclude SARS-CoV-2 infection, do not rule out co-infections with other pathogens, and should not be used as the sole basis for treatment or other patient management decisions. Negative results must be combined with clinical observations, patient history, and epidemiological information. The expected result is Negative. Fact Sheet for Patients: SugarRoll.be Fact Sheet for Healthcare Providers: https://www.woods-mathews.com/ This test is not yet approved or cleared by the [place] FDA and  has been authorized for detection and/or diagnosis of SARS-CoV-2 by FDA under an Emergency Use Authorization (EUA). This EUA will remain  in effect (meaning this test can be used) for the duration of the COVID-19 declaration under Section 56 4(b)(1) of the Act, 21 U.S.C. section 360bbb-3(b)(1), unless the authorization is terminated or revoked sooner. Performed at [facility], [place] [address], [place], [place] [address]          Radiology Studies: No results found.       Scheduled Meds: . diphenhydrAMINE  25 mg Intravenous Q6H  . enoxaparin (LOVENOX) injection  40 mg Subcutaneous Q24H  . [START ON 33/54/5625] folic acid  1 mg Oral Daily  . LORazepam  1 mg Intravenous Q6H  . multivitamin with minerals  1 tablet Oral Daily  . nystatin  5 mL Oral QID  . primidone  50 mg Oral TID  . thiamine  100 mg Oral Daily   Continuous Infusions: . sodium chloride 75 mL/hr at 03/14/19 0402     LOS: 2 days    Time spent: 30 minutes with over 50% of the time coordinating the patient's care    Harold Hedge, DO [facility] Pager [phone]  If 7PM-7AM, please contact night-coverage [email or web address] Password [facility] 03/14/2019, 6:24 PM

## 2019-03-14 NOTE — Progress Notes (Signed)
Pharmacy IV to PO conversion  The patient is receiving Folic acid by the intravenous route.  Based on criteria approved by the Pharmacy and [place], the medication is being converted to the equivalent oral dose form.   No active GI bleeding or impaired absorption  Not s/p esophagectomy  Documented ability to take oral medications for > 24 hr  Plan to continue treatment for at least 1 day  If you have any questions about this conversion, please contact the Pharmacy Department (ext [phone]).  Thank you.  Reuel Boom, PharmD, BCPS [phone] 03/14/2019, 2:29 PM

## 2019-03-15 ENCOUNTER — Inpatient Hospital Stay (HOSPITAL_COMMUNITY): Payer: Medicaid Other

## 2019-03-15 DIAGNOSIS — R945 Abnormal results of liver function studies: Secondary | ICD-10-CM

## 2019-03-15 LAB — COMPREHENSIVE METABOLIC PANEL
ALT: 53 U/L — ABNORMAL HIGH (ref 0–44)
AST: 88 U/L — ABNORMAL HIGH (ref 15–41)
Albumin: 2.5 g/dL — ABNORMAL LOW (ref 3.5–5.0)
Alkaline Phosphatase: 183 U/L — ABNORMAL HIGH (ref 38–126)
Anion gap: 8 (ref 5–15)
BUN: 6 mg/dL (ref 6–20)
CO2: 23 mmol/L (ref 22–32)
Calcium: 8.2 mg/dL — ABNORMAL LOW (ref 8.9–10.3)
Chloride: 105 mmol/L (ref 98–111)
Creatinine, Ser: 0.83 mg/dL (ref 0.61–1.24)
GFR calc Af Amer: >60 mL/min (ref >60)
GFR calc non Af Amer: >60 mL/min (ref >60)
Glucose, Bld: 97 mg/dL (ref 70–99)
Potassium: 3.8 mmol/L (ref 3.5–5.1)
Sodium: 136 mmol/L (ref 135–145)
Total Bilirubin: 0.6 mg/dL (ref 0.3–1.2)
Total Protein: 5.1 g/dL — ABNORMAL LOW (ref 6.5–8.1)

## 2019-03-15 LAB — HCV AB W REFLEX TO QUANT PCR: HCV Ab: <0.1 s/co ratio (ref 0.0–0.9)

## 2019-03-15 LAB — CBC
HCT: 37 % — ABNORMAL LOW (ref 39.0–52.0)
Hemoglobin: 11.5 g/dL — ABNORMAL LOW (ref 13.0–17.0)
MCH: 26.3 pg (ref 26.0–34.0)
MCHC: 31.1 g/dL (ref 30.0–36.0)
MCV: 84.7 fL (ref 80.0–100.0)
Platelets: 472 10*3/uL — ABNORMAL HIGH (ref 150–400)
RBC: 4.37 MIL/uL (ref 4.22–5.81)
RDW: 14.6 % (ref 11.5–15.5)
WBC: 10.5 10*3/uL (ref 4.0–10.5)
nRBC: 0 % (ref 0.0–0.2)

## 2019-03-15 LAB — HEPATITIS PANEL, ACUTE
HCV Ab: NONREACTIVE
Hep A IgM: NONREACTIVE
Hep B C IgM: NONREACTIVE
Hepatitis B Surface Ag: NONREACTIVE

## 2019-03-15 LAB — HCV INTERPRETATION

## 2019-03-15 LAB — MAGNESIUM: Magnesium: 1.5 mg/dL — ABNORMAL LOW (ref 1.7–2.4)

## 2019-03-15 LAB — CK: Total CK: 272 U/L (ref 49–397)

## 2019-03-15 LAB — PHOSPHORUS: Phosphorus: 4.3 mg/dL (ref 2.5–4.6)

## 2019-03-15 MED ORDER — MAGNESIUM SULFATE 2 GM/50ML IV SOLN
2.0000 g | Freq: Once | INTRAVENOUS | Status: AC
  Administered 2019-03-15: 2 g via INTRAVENOUS
  Filled 2019-03-15: qty 50

## 2019-03-15 MED ORDER — LORAZEPAM 2 MG/ML IJ SOLN
1.0000 mg | Freq: Four times a day (QID) | INTRAMUSCULAR | Status: DC
  Administered 2019-03-15 – 2019-03-19 (×14): 1 mg via INTRAVENOUS
  Filled 2019-03-15 (×14): qty 1

## 2019-03-15 NOTE — Plan of Care (Signed)
  Problem: Education: Goal: Knowledge of General Education information will improve Description: Including pain rating scale, medication(s)/side effects and non-pharmacologic comfort measures Outcome: Progressing   Problem: Clinical Measurements: Goal: Diagnostic test results will improve Outcome: Progressing

## 2019-03-15 NOTE — Consult Note (Signed)
Referring Provider:  [facility] Primary Care Physician:  System, Pcp Not In Primary Gastroenterologist: Unassigned/not seen in the office.  Reason for Consultation: Abnormal LFTs  HPI: Carl Carey is a 38 y.o. male with past medical history of bipolar disorder, history of medication induced extrapyramidal syndrome admitted to the hospital for possible NMS versus extrapyramidal symptoms.  He was found to have abnormal LFTs.  GI is consulted for further evaluation.  AST 88, ALT 53, alkaline phosphatase 183 and total bilirubin normal.  Hepatitis panel negative.  Ultrasound abdomen right upper quadrant today was normal.  CBC showed hemoglobin of 11.5 with mild elevated platelet count of 472.  Patient seen and examined at bedside.  He is not taking any medication for his Crohn's disease at this time.  Complaining of diarrhea with 3-4 bowel movements per day.  Complaining of intermittent blood in the stool.  He denies any abdominal pain, nausea or vomiting.   Previous GI work-up ------------------------- -Patient was diagnosed with Crohn's disease in September 2020.   CT scan in September 2020 showed thickening and luminal narrowing of the 2 segment of the terminal ileum concerning for Crohn's disease.  EGD  on January 21, 2019 showed gastric ulcer and esophagitis.  Colonoscopy showed multiple rectal ulcers, ascending colon polyp, not able to intubate TI.  Gastric biopsies were negative for H. pylori.  Rectal biopsy showed moderate to severe active chronic colitis consistent with inflammatory bowel disease.  Ascending colon polyp biopsy showed benign polypoid mucosa without any dysplasia.  He was discharged home on tapered steroid.  Outpatient follow-up was provided but he was no-show.  Multiple attempts were made to reach the patient.  QuantiFERON TB Gold on January 24, 2019 indeterminate.    Past Medical History:  Diagnosis Date  . Bipolar 1 disorder ([place])   . Diabetes ([place])   .  Malnutrition ([place])   . Panic attack   . Parkinsonism [facility])     Past Surgical History:  Procedure Laterality Date  . BIOPSY  01/21/2019   Procedure: BIOPSY;  Surgeon: Otis Brace, MD;  Location: [place];  Service: Gastroenterology;;  . COLONOSCOPY WITH PROPOFOL N/A 01/21/2019   Procedure: COLONOSCOPY WITH PROPOFOL;  Surgeon: Otis Brace, MD;  Location: [place];  Service: Gastroenterology;  Laterality: N/A;  . ESOPHAGOGASTRODUODENOSCOPY (EGD) WITH PROPOFOL N/A 01/21/2019   Procedure: ESOPHAGOGASTRODUODENOSCOPY (EGD) WITH PROPOFOL;  Surgeon: Otis Brace, MD;  [facility];  Service: Gastroenterology;  Laterality: N/A;  . MULTIPLE EXTRACTIONS WITH ALVEOLOPLASTY N/A 07/29/2014   Procedure: Extraction of tooth #'s 1,15,16,17,18 with alveoloplasty and gross debridement of teeth;  Surgeon: Lenn Cal, DDS;  Location: [place];  Service: Oral Surgery;  Laterality: N/A;  . NECK SURGERY      Prior to Admission medications   Medication Sig Start Date End Date Taking? Authorizing Provider  benztropine (COGENTIN) 1 MG tablet Take 1 tablet (1 mg total) by mouth 3 (three) times daily. 03/07/19  Yes Christian, Rylee, MD  loperamide (IMODIUM) 2 MG capsule Take 1 capsule (2 mg total) by mouth 4 (four) times daily as needed for diarrhea or loose stools. Max 16 mg (8 capsules) per day 02/24/19  Yes Sherwood Gambler, MD  pantoprazole (PROTONIX) 40 MG tablet Take 1 tablet (40 mg total) by mouth 2 (two) times daily. 02/23/19  Yes Fulp, Cammie, MD  potassium chloride SA (KLOR-CON) 20 MEQ tablet Take 1 tablet (20 mEq total) by mouth daily. 02/24/19  Yes Sherwood Gambler, MD  haloperidol (HALDOL) 10 MG tablet Take 1  tablet (10 mg total) by mouth daily. Patient not taking: Reported on 03/11/2019 03/07/19   Mitzi Hansen, MD    Scheduled Meds: . diphenhydrAMINE  25 mg Intravenous Q6H  . enoxaparin (LOVENOX) injection  40 mg Subcutaneous Q24H  . folic acid  1 mg Oral Daily  .  LORazepam  1 mg Intravenous Q6H  . multivitamin with minerals  1 tablet Oral Daily  . primidone  50 mg Oral TID  . thiamine  100 mg Oral Daily   Continuous Infusions: . sodium chloride 75 mL/hr at 03/15/19 0521   PRN Meds:.acetaminophen **OR** acetaminophen, loperamide, ondansetron **OR** ondansetron (ZOFRAN) IV  Allergies as of 03/12/2019  . (No Known Allergies)    Family History  Problem Relation Age of Onset  . Hypertension Maternal Grandmother     Social History   Socioeconomic History  . Marital status: Single    Spouse name: Not on file  . Number of children: Not on file  . Years of education: Not on file  . Highest education level: Not on file  Occupational History  . Not on file  Social Needs  . Financial resource strain: Not on file  . Food insecurity    Worry: Not on file    Inability: Not on file  . Transportation needs    Medical: Not on file    Non-medical: Not on file  Tobacco Use  . Smoking status: Current Some Day Smoker    Types: Cigarettes  . Smokeless tobacco: Never Used  Substance and Sexual Activity  . Alcohol use: Yes    Comment: socially  . Drug use: No  . Sexual activity: Not Currently  Lifestyle  . Physical activity    Days per week: Not on file    Minutes per session: Not on file  . Stress: Not on file  Relationships  . Social Herbalist on phone: Not on file    Gets together: Not on file    Attends religious service: Not on file    Active member of club or organization: Not on file    Attends meetings of clubs or organizations: Not on file    Relationship status: Not on file  . Intimate partner violence    Fear of current or ex partner: Not on file    Emotionally abused: Not on file    Physically abused: Not on file    Forced sexual activity: Not on file  Other Topics Concern  . Not on file  Social History Narrative  . Not on file    Review of Systems: 11 point review of system negative except as mentioned in  HPI  Physical Exam: Vital signs: Vitals:   03/15/19 0451 03/15/19 1349  BP: 110/68 115/73  Pulse: 79 88  Resp: 18 (!) 24  Temp: 98 F (36.7 C) 98.3 F (36.8 C)  SpO2: 100% 99%   Last BM Date: 03/14/19 Physical Exam  Constitutional: He is oriented to person, place, and time. He appears well-developed and well-nourished.  HENT:  Head: Normocephalic and atraumatic.  Eyes: EOM are normal. No scleral icterus.  Neck: Normal range of motion. Neck supple.  Cardiovascular: Normal rate, regular rhythm and normal heart sounds.  Pulmonary/Chest: Effort normal. No respiratory distress.  Abdominal: Soft. Bowel sounds are normal. He exhibits no distension. There is no abdominal tenderness. There is no rebound and no guarding.  Musculoskeletal: Normal range of motion.        General: No edema.  Neurological:  He is alert and oriented to person, place, and time.  Skin: Skin is warm. No erythema.  Psychiatric: He has a normal mood and affect. Judgment and thought content normal.  Vitals reviewed.   GI:  Lab Results: Recent Labs    03/13/19 0535 03/14/19 0430 03/15/19 0359  WBC 7.2 9.2 10.5  HGB 11.2* 11.3* 11.5*  HCT 36.0* 35.9* 37.0*  PLT 468* 463* 472*   BMET Recent Labs    03/13/19 0540 03/14/19 0430 03/15/19 0359  NA 137 137 136  K 3.5 3.6 3.8  CL 108 109 105  CO2 20* 22 23  GLUCOSE 70 101* 97  BUN <5* <5* 6  CREATININE 0.76 0.68 0.83  CALCIUM 7.8* 8.1* 8.2*   LFT Recent Labs    03/12/19 2248  03/15/19 0359  PROT 5.5*   < > 5.1*  ALBUMIN 2.6*   < > 2.5*  AST 36   < > 88*  ALT 38   < > 53*  ALKPHOS 165*   < > 183*  BILITOT 0.9   < > 0.6  BILIDIR 0.1  --   --   IBILI 0.8  --   --    < > = values in this interval not displayed.   PT/INR No results for input(s): LABPROT, INR in the last 72 hours.   Studies/Results: US Abdomen Limited Ruq  Result Date: 03/15/2019 CLINICAL DATA:  Abnormal LFTs. EXAM: ULTRASOUND ABDOMEN LIMITED RIGHT UPPER QUADRANT  COMPARISON:  2.5 mm FINDINGS: Gallbladder: No gallstones or wall thickening visualized. No sonographic Murphy sign noted by sonographer. Common bile duct: Diameter: 2.5 mm Liver: No focal lesion identified. Within normal limits in parenchymal echogenicity. Portal vein is patent on color Doppler imaging with normal direction of blood flow towards the liver. Other: None. IMPRESSION: Negative exam.  No gallstones or biliary distention. Electronically Signed   By: Marcello Moores  Register   On: 03/15/2019 08:00    Impression/Plan: -Abnormal LFTs.  Could be medication induced.  Hepatitis panel negative.  Ultrasound negative. -Crohn's disease.  Involving rectum and terminal ileum.  Was discharged in September on tapered steroid.  Currently not on any medication.  Was no-show for follow-up  -Extraparametal syndrome  AST 88, ALT 53, alkaline phosphatase 183 and total bilirubin normal.  Hepatitis panel negative.  Ultrasound abdomen right upper quadrant today was normal.  CBC showed hemoglobin of 11.5 with mild elevated platelet count of 472.   CT scan in September 2020 showed thickening and luminal narrowing of the 2 segment of the terminal ileum concerning for Crohn's disease.   Recommendations ------------------------- -Check AMA and ASMA. -Repeat LFTs in the morning.  Check INR in the morning. -Repeat QuantiFERON TB Gold.  Check TPMT enzyme activity level. -He may need some form of maintenance medication for his Crohn's disease. - GI will follow.     LOS: 3 days   Otis Brace  MD, FACP 03/15/2019, 2:26 PM  Contact #  [phone]

## 2019-03-15 NOTE — Progress Notes (Signed)
PROGRESS NOTE    Carl Carey    Code Status: Full Code  YIR:[id number] DOB: 1980-07-21 DOA: 03/12/2019  PCP: System, Pcp Not In    Hospital Summary  Patient is a 38 year old male with history of bipolar disorder, parkinsonism from antipsychotics in the past, diabetes mellitus, EPS from the medications presented to ED with tremors.  Patient was seen in ED on 11/8 for presumed seizure-like activities.  EMS was called for continued tremors.  He had recent admission on 11/4 for EPS and rhabdomyolysis.  During prior hospitalization, patient was on Abilify Haldol trazodone, Celexa and Cogentin.  Abilify and Celexa was discontinued, Haldol was decreased.  Patient received Benadryl, clonazepam with improvement of his symptoms. ED physician discussed with Dr. Dwyane Dee (psychiatry), recommended IV hydration, stop all antipsychotics and continue Ativan and Benadryl.  In ED, patient was found to be alert, rigidity of the upper and lower extremities, tremors.  Patient's mother did not know what medications patient was currently taking.  Psych has been consulted  A & P   Active Problems:   Parkinsonian features   Dysphagia   Bipolar 1 disorder (HCC)   Extrapyramidal symptom   Rhabdomyolysis   NMS (neuroleptic malignant syndrome)   Encephalopathy acute   Abnormal LFTs   NMS vs. Extrapyramidal symptoms afebrile but with rhabdomyolosis on presentation and rigidity. patient well-known to psychiatry.  Improved but persistent stuttering on exam and muscular rigidity, most notably neck and facial muscles.  On Benadryl every 6 hours with Mysoline -Psych added on Mysoline -Benadryl every 6 hours -Ativan 1 mg every 6 hours -Trazodone, Haldol discontinued -Discontinue CIWA protocol, negative ethanol level on admission, no signs of alcohol withdrawal  Acute mild rhabdomyolysis resolved with IV fluids  Dysphagia on dysphagia 3 diet -SLP on board  Oral thrush resolved with nystatin HIV and HBV negative  -discontinue nystatin  Bipolar disorder -Appreciate psychiatry recommendations  Transaminitis RUQ ultrasound negative.  Alk phos 168->183, AST/ALT 48/40-> 88/53. -GI consulted -Ceruloplasmin ordered to check for Wilson's disease in young male psychiatric patient with elevated LFTs and anemia  Hypomagnesemia -Replete   DVT prophylaxis: Lovenox Diet: Dysphagia 3 Family Communication: No family at bedside.  Discussed with patient Disposition Plan: Remains inpatient with tremors.  Pending clinical stability.  Will need home health nurse for medication management at discharge.  Possible DC in 1 to 2 days  Consultants  Psychiatry Phone consult with neurology GI    Subjective   Patient seen and examined this morning plan.  States that his symptoms have improved however still complaining of jaw and neck tightness as well as stuttering.  Denies chest pain, shortness of breath, nausea, vomiting or any other complaints.  Remaining ROS negative.  Objective   Vitals:   03/14/19 1259 03/14/19 1404 03/14/19 2056 03/15/19 0451  BP: 124/87  118/81 110/68  Pulse: 85  78 79  Resp: 19  18 18   Temp: 98.5 F (36.9 C)  99 F (37.2 C) 98 F (36.7 C)  TempSrc: Oral  Oral Oral  SpO2: 99% 95% 100% 100%  Weight:      Height:        Intake/Output Summary (Last 24 hours) at 03/15/2019 0951 Last data filed at 03/15/2019 0842 Gross per 24 hour  Intake 1916.24 ml  Output 4395 ml  Net -2478.76 ml   Filed Weights   03/13/19 1436  Weight: 58.4 kg    Examination:  Physical Exam Vitals signs and nursing note reviewed.  Constitutional:      Appearance:  Normal appearance.  HENT:     Head: Normocephalic and atraumatic.     Mouth/Throat:     Mouth: Mucous membranes are moist.     Comments: No thrush Eyes:     Extraocular Movements: Extraocular movements intact.  Neck:     Musculoskeletal: Normal range of motion.     Comments: Hypertonic bilateral trapezius Cardiovascular:     Rate  and Rhythm: Normal rate and regular rhythm.  Pulmonary:     Effort: Pulmonary effort is normal.     Breath sounds: Normal breath sounds.  Abdominal:     General: Abdomen is flat.     Palpations: Abdomen is soft.  Musculoskeletal:        General: No swelling.  Neurological:     Mental Status: He is alert and oriented to person, place, and time.     Comments: Persistent stuttering  Psychiatric:        Mood and Affect: Mood normal.        Behavior: Behavior normal.     Data Reviewed: I have personally reviewed following labs and imaging studies  CBC: Recent Labs  Lab 03/11/19 1904 03/12/19 1150 03/13/19 0535 03/14/19 0430 03/15/19 0359  WBC 11.0* 10.5 7.2 9.2 10.5  NEUTROABS 9.4* 8.9*  --   --   --   HGB 13.2 13.3 11.2* 11.3* 11.5*  HCT 41.2 41.8 36.0* 35.9* 37.0*  MCV 81.9 82.8 84.7 84.1 84.7  PLT 599* 586* 468* 463* 630*   Basic Metabolic Panel: Recent Labs  Lab 03/11/19 1904 03/12/19 1150 03/12/19 2248 03/13/19 0535 03/13/19 0540 03/14/19 0430 03/15/19 0359  NA 136 137  --   --  137 137 136  K 3.2* 3.8  --   --  3.5 3.6 3.8  CL 102 106  --   --  108 109 105  CO2 22 21*  --   --  20* 22 23  GLUCOSE 124* 105*  --   --  70 101* 97  BUN <5* <5*  --   --  <5* <5* 6  CREATININE 1.08 0.69  --   --  0.76 0.68 0.83  CALCIUM 9.1 8.9  --   --  7.8* 8.1* 8.2*  MG  --  1.6*  --  2.2  --   --  1.5*  PHOS  --  3.4 2.8 3.0  --   --  4.3   GFR: Estimated Creatinine Clearance: 101.6 mL/min (by C-G formula based on SCr of 0.83 mg/dL). Liver Function Tests: Recent Labs  Lab 03/12/19 2248 03/13/19 0540 03/14/19 0430 03/15/19 0359  AST 36 31 48* 88*  ALT 38 36 40 53*  ALKPHOS 165* 151* 168* 183*  BILITOT 0.9 1.0 0.2* 0.6  PROT 5.5* 5.2* 5.3* 5.1*  ALBUMIN 2.6* 2.5* 2.6* 2.5*   No results for input(s): LIPASE, AMYLASE in the last 168 hours. No results for input(s): AMMONIA in the last 168 hours. Coagulation Profile: No results for input(s): INR, PROTIME in the last  168 hours. Cardiac Enzymes: Recent Labs  Lab 03/11/19 1904 03/12/19 1150 03/13/19 0535 03/14/19 0430 03/15/19 0359  CKTOTAL 520* 788* 662* 397 272   BNP (last 3 results) No results for input(s): PROBNP in the last 8760 hours. HbA1C: No results for input(s): HGBA1C in the last 72 hours. CBG: Recent Labs  Lab 03/11/19 1953 03/12/19 1150  GLUCAP 101* 93   Lipid Profile: No results for input(s): CHOL, HDL, LDLCALC, TRIG, CHOLHDL, LDLDIRECT in the last 72 hours.  Thyroid Function Tests: Recent Labs    03/12/19 2248  TSH 0.633   Anemia Panel: No results for input(s): VITAMINB12, FOLATE, FERRITIN, TIBC, IRON, RETICCTPCT in the last 72 hours. Sepsis Labs: Recent Labs  Lab 03/11/19 1954  LATICACIDVEN 0.9    Recent Results (from the past 240 hour(s))  SARS CORONAVIRUS 2 (TAT 6-24 HRS) Nasopharyngeal Nasopharyngeal Swab     Status: None   Collection Time: 03/12/19  2:41 PM   Specimen: Nasopharyngeal Swab  Result Value Ref Range Status   SARS Coronavirus 2 NEGATIVE NEGATIVE Final    Comment: (NOTE) SARS-CoV-2 target nucleic acids are NOT DETECTED. The SARS-CoV-2 RNA is generally detectable in upper and lower respiratory specimens during the acute phase of infection. Negative results do not preclude SARS-CoV-2 infection, do not rule out co-infections with other pathogens, and should not be used as the sole basis for treatment or other patient management decisions. Negative results must be combined with clinical observations, patient history, and epidemiological information. The expected result is Negative. Fact Sheet for Patients: SugarRoll.be Fact Sheet for Healthcare Providers: https://www.woods-mathews.com/ This test is not yet approved or cleared by the [place] FDA and  has been authorized for detection and/or diagnosis of SARS-CoV-2 by FDA under an Emergency Use Authorization (EUA). This EUA will remain  in effect  (meaning this test can be used) for the duration of the COVID-19 declaration under Section 56 4(b)(1) of the Act, 21 U.S.C. section 360bbb-3(b)(1), unless the authorization is terminated or revoked sooner. Performed at [facility], [place] [address] [place], [place] [address]          Radiology Studies: US Abdomen Limited Ruq  Result Date: 03/15/2019 CLINICAL DATA:  Abnormal LFTs. EXAM: ULTRASOUND ABDOMEN LIMITED RIGHT UPPER QUADRANT COMPARISON:  2.5 mm FINDINGS: Gallbladder: No gallstones or wall thickening visualized. No sonographic Murphy sign noted by sonographer. Common bile duct: Diameter: 2.5 mm Liver: No focal lesion identified. Within normal limits in parenchymal echogenicity. Portal vein is patent on color Doppler imaging with normal direction of blood flow towards the liver. Other: None. IMPRESSION: Negative exam.  No gallstones or biliary distention. Electronically Signed   By: Marcello Moores  Register   On: 03/15/2019 08:00        Scheduled Meds: . diphenhydrAMINE  25 mg Intravenous Q6H  . enoxaparin (LOVENOX) injection  40 mg Subcutaneous Q24H  . folic acid  1 mg Oral Daily  . multivitamin with minerals  1 tablet Oral Daily  . nystatin  5 mL Oral QID  . primidone  50 mg Oral TID  . thiamine  100 mg Oral Daily   Continuous Infusions: . sodium chloride 75 mL/hr at 03/15/19 0521  . magnesium sulfate bolus IVPB       LOS: 3 days    Time spent: 25 minutes with over 50% of the time coordinating the patient's care    Harold Hedge, DO [facility] Pager [phone]  If 7PM-7AM, please contact night-coverage [email or web address] Password [facility] 03/15/2019, 9:51 AM

## 2019-03-16 DIAGNOSIS — K5090 Crohn's disease, unspecified, without complications: Secondary | ICD-10-CM

## 2019-03-16 LAB — MAGNESIUM: Magnesium: 1.9 mg/dL (ref 1.7–2.4)

## 2019-03-16 LAB — CBC
HCT: 36.1 % — ABNORMAL LOW (ref 39.0–52.0)
Hemoglobin: 11.4 g/dL — ABNORMAL LOW (ref 13.0–17.0)
MCH: 26.6 pg (ref 26.0–34.0)
MCHC: 31.6 g/dL (ref 30.0–36.0)
MCV: 84.1 fL (ref 80.0–100.0)
Platelets: 384 10*3/uL (ref 150–400)
RBC: 4.29 MIL/uL (ref 4.22–5.81)
RDW: 15 % (ref 11.5–15.5)
WBC: 7.3 10*3/uL (ref 4.0–10.5)
nRBC: 0 % (ref 0.0–0.2)

## 2019-03-16 LAB — COMPREHENSIVE METABOLIC PANEL
ALT: 150 U/L — ABNORMAL HIGH (ref 0–44)
AST: 273 U/L — ABNORMAL HIGH (ref 15–41)
Albumin: 2.3 g/dL — ABNORMAL LOW (ref 3.5–5.0)
Alkaline Phosphatase: 248 U/L — ABNORMAL HIGH (ref 38–126)
Anion gap: 6 (ref 5–15)
BUN: 6 mg/dL (ref 6–20)
CO2: 26 mmol/L (ref 22–32)
Calcium: 7.9 mg/dL — ABNORMAL LOW (ref 8.9–10.3)
Chloride: 104 mmol/L (ref 98–111)
Creatinine, Ser: 0.8 mg/dL (ref 0.61–1.24)
GFR calc Af Amer: >60 mL/min (ref >60)
GFR calc non Af Amer: >60 mL/min (ref >60)
Glucose, Bld: 87 mg/dL (ref 70–99)
Potassium: 4.3 mmol/L (ref 3.5–5.1)
Sodium: 136 mmol/L (ref 135–145)
Total Bilirubin: 0.4 mg/dL (ref 0.3–1.2)
Total Protein: 5.1 g/dL — ABNORMAL LOW (ref 6.5–8.1)

## 2019-03-16 LAB — CERULOPLASMIN: Ceruloplasmin: 23 mg/dL (ref 16.0–31.0)

## 2019-03-16 LAB — PROTIME-INR
INR: 1 (ref 0.8–1.2)
Prothrombin Time: 13.3 seconds (ref 11.4–15.2)

## 2019-03-16 LAB — CK: Total CK: 140 U/L (ref 49–397)

## 2019-03-16 MED ORDER — PREDNISONE 20 MG PO TABS
20.0000 mg | ORAL_TABLET | Freq: Every day | ORAL | Status: DC
  Administered 2019-03-16 – 2019-03-19 (×4): 20 mg via ORAL
  Filled 2019-03-16 (×4): qty 1

## 2019-03-16 MED ORDER — DIPHENHYDRAMINE HCL 50 MG/ML IJ SOLN
25.0000 mg | Freq: Four times a day (QID) | INTRAMUSCULAR | Status: DC
  Administered 2019-03-16 – 2019-03-19 (×13): 25 mg via INTRAVENOUS
  Filled 2019-03-16 (×13): qty 1

## 2019-03-16 MED ORDER — DIPHENHYDRAMINE HCL 50 MG/ML IJ SOLN: 50.0000 mg | Freq: Four times a day (QID) | INTRAMUSCULAR | Status: DC

## 2019-03-16 NOTE — Progress Notes (Signed)
PROGRESS NOTE    Carl Carey    Code Status: Full Code  RVI:[id number] DOB: 07/09/1980 DOA: 03/12/2019  PCP: System, Pcp Not In    Hospital Summary  Patient is a 38 year old male with history of bipolar disorder, parkinsonism from antipsychotics in the past, diabetes mellitus, EPS from the medications presented to ED with tremors.  Patient was seen in ED on 11/8 for presumed seizure-like activities.  EMS was called for continued tremors.  He had recent admission on 11/4 for EPS and rhabdomyolysis.  During prior hospitalization, patient was on Abilify Haldol trazodone, Celexa and Cogentin.  Abilify and Celexa was discontinued, Haldol was decreased.  Patient received Benadryl, clonazepam with improvement of his symptoms. ED physician discussed with Dr. Dwyane Dee (psychiatry), recommended IV hydration, stop all antipsychotics and continue Ativan and Benadryl.  In ED, patient was found to be alert, rigidity of the upper and lower extremities, tremors.  Patient's mother did not know what medications patient was currently taking.  Psych has been consulted  A & P   Active Problems:   Parkinsonian features   Dysphagia   Bipolar 1 disorder (HCC)   Extrapyramidal symptom   Rhabdomyolysis   NMS (neuroleptic malignant syndrome)   Encephalopathy acute   Abnormal LFTs   NMS vs. Extrapyramidal symptoms somnolent today. afebrile but with rhabdomyolosis on presentation and rigidity. patient well-known to psychiatry. Continues to have improving but persistent stuttering on exam and muscular rigidity, most notably neck and facial muscles. rigidity improved.  On Benadryl every 6 hours with Mysoline.   -Continue benadryl 25 mg q6h -Psych added on Mysoline this hospitalization -Continue Ativan 1 mg every 6 hours -Heating pad to neck -Cannot give muscle relaxers at this time given hepatic impairment -Trazodone, Haldol discontinued  Crohn's disease involving the rectum and terminal ileum was discharged in  September on tapered steroid currently not on any medication and was no-show for follow-up. -GI on board, started on prednisone 20 mg today and may consider starting Imuran.  Ideally he would benefit from biologic but compliance is an issue. -Will hold imodium   Acute mild rhabdomyolysis resolved with IV fluids  Dysphagia on dysphagia 3 diet -SLP on board  Oral thrush resolved with nystatin HIV and HBV negative -discontinue nystatin  Bipolar disorder -Appreciate psychiatry recommendations  Transaminitis RUQ ultrasound negative.  Trending up.  Alk phos 168->183->248, AST/ALT 48/40->  88/53->273/150. -GI on board, follow-up on AMA, ASMA, QuantiFERON-TB gold and TPMT enzyme level -Follow-up on ceruloplasmin ordered to check for Wilson's disease in young male psychiatric patient with elevated LFTs and anemia  Hypomagnesemia -Replete   DVT prophylaxis: Lovenox Diet: Dysphagia 3 Family Communication: No family at bedside.  Discussed with patient Disposition Plan: Remains inpatient with tremors.  Pending clinical stability.  Will need home health nurse for medication management at discharge.  Possible DC in 1 to 2 days  Consultants  Psychiatry Phone consult with neurology GI    Subjective   Nursing reported the patient is having persistent muscle stiffness and discomfort in his neck leading to difficulty sleeping. Patient was seen sleeping. Admits to symptoms above. Admits to loose BMs  Objective   Vitals:   03/15/19 0451 03/15/19 1349 03/15/19 2134 03/16/19 0557  BP: 110/68 115/73 117/71 96/68  Pulse: 79 88 84 76  Resp: 18 (!) _0 Temp: 98 F (36.7 C) 98.3 F (36.8 C) 98.5 F (36.9 C) 98.5 F (36.9 C)  TempSrc: Oral Oral Oral Oral  SpO2: 100% 99% 99% 100%  Weight:      Height:        Intake/Output Summary (Last 24 hours) at 03/16/2019 0942 Last data filed at 03/16/2019 0855 Gross per 24 hour  Intake 2846.29 ml  Output 2450 ml  Net 396.29 ml   Filed  Weights   03/13/19 1436  Weight: 58.4 kg    Examination:  Physical Exam Vitals signs and nursing note reviewed.  Constitutional:      Appearance: Normal appearance.     Comments: somnolent  HENT:     Head: Normocephalic and atraumatic.     Mouth/Throat:     Mouth: Mucous membranes are moist.  Eyes:     Extraocular Movements: Extraocular movements intact.  Neck:     Comments: Neck hypertonicity Cardiovascular:     Rate and Rhythm: Normal rate and regular rhythm.  Pulmonary:     Effort: Pulmonary effort is normal.     Breath sounds: Normal breath sounds.  Abdominal:     General: Abdomen is flat. Bowel sounds are normal. There is no distension.     Palpations: Abdomen is soft.  Musculoskeletal:     Comments: Responds to commands when asked to raise bilateral upper and lower extremities. Arms and legs less stiff today and able to stay lifted without tremors   Neurological:     Comments: Somnolent but arousable and responds to commands appropriately     Data Reviewed: I have personally reviewed following labs and imaging studies  CBC: Recent Labs  Lab 03/11/19 1904 03/12/19 1150 03/13/19 0535 03/14/19 0430 03/15/19 0359 03/16/19 0435  WBC 11.0* 10.5 7.2 9.2 10.5 7.3  NEUTROABS 9.4* 8.9*  --   --   --   --   HGB 13.2 13.3 11.2* 11.3* 11.5* 11.4*  HCT 41.2 41.8 36.0* 35.9* 37.0* 36.1*  MCV 81.9 82.8 84.7 84.1 84.7 84.1  PLT 599* 586* 468* 463* 472* 427   Basic Metabolic Panel: Recent Labs  Lab 03/12/19 1150 03/12/19 2248 03/13/19 0535 03/13/19 0540 03/14/19 0430 03/15/19 0359 03/16/19 0435  NA 137  --   --  137 137 136 136  K 3.8  --   --  3.5 3.6 3.8 4.3  CL 106  --   --  108 109 105 104  CO2 21*  --   --  20* _0 GLUCOSE 105*  --   --  70 101* 97 87  BUN <5*  --   --  <5* <5* 6 6  CREATININE 0.69  --   --  0.76 0.68 0.83 0.80  CALCIUM 8.9  --   --  7.8* 8.1* 8.2* 7.9*  MG 1.6*  --  2.2  --   --  1.5* 1.9  PHOS 3.4 2.8 3.0  --   --  4.3  --     GFR: Estimated Creatinine Clearance: 105.4 mL/min (by C-G formula based on SCr of 0.8 mg/dL). Liver Function Tests: Recent Labs  Lab 03/12/19 2248 03/13/19 0540 03/14/19 0430 03/15/19 0359 03/16/19 0435  AST 36 31 48* 88* 273*  ALT 38 36 40 53* 150*  ALKPHOS 165* 151* 168* 183* 248*  BILITOT 0.9 1.0 0.2* 0.6 0.4  PROT 5.5* 5.2* 5.3* 5.1* 5.1*  ALBUMIN 2.6* 2.5* 2.6* 2.5* 2.3*   No results for input(s): LIPASE, AMYLASE in the last 168 hours. No results for input(s): AMMONIA in the last 168 hours. Coagulation Profile: Recent Labs  Lab 03/16/19 0435  INR 1.0   Cardiac Enzymes: Recent Labs  Lab 03/12/19 1150 03/13/19 0535 03/14/19 0430 03/15/19 0359 03/16/19 0435  CKTOTAL 788* 662* 397 272 140   BNP (last 3 results) No results for input(s): PROBNP in the last 8760 hours. HbA1C: No results for input(s): HGBA1C in the last 72 hours. CBG: Recent Labs  Lab 03/11/19 1953 03/12/19 1150  GLUCAP 101* 93   Lipid Profile: No results for input(s): CHOL, HDL, LDLCALC, TRIG, CHOLHDL, LDLDIRECT in the last 72 hours. Thyroid Function Tests: No results for input(s): TSH, T4TOTAL, FREET4, T3FREE, THYROIDAB in the last 72 hours. Anemia Panel: No results for input(s): VITAMINB12, FOLATE, FERRITIN, TIBC, IRON, RETICCTPCT in the last 72 hours. Sepsis Labs: Recent Labs  Lab 03/11/19 1954  LATICACIDVEN 0.9    Recent Results (from the past 240 hour(s))  SARS CORONAVIRUS 2 (TAT 6-24 HRS) Nasopharyngeal Nasopharyngeal Swab     Status: None   Collection Time: 03/12/19  2:41 PM   Specimen: Nasopharyngeal Swab  Result Value Ref Range Status   SARS Coronavirus 2 NEGATIVE NEGATIVE Final    Comment: (NOTE) SARS-CoV-2 target nucleic acids are NOT DETECTED. The SARS-CoV-2 RNA is generally detectable in upper and lower respiratory specimens during the acute phase of infection. Negative results do not preclude SARS-CoV-2 infection, do not rule out co-infections with other pathogens,  and should not be used as the sole basis for treatment or other patient management decisions. Negative results must be combined with clinical observations, patient history, and epidemiological information. The expected result is Negative. Fact Sheet for Patients: SugarRoll.be Fact Sheet for Healthcare Providers: https://www.woods-mathews.com/ This test is not yet approved or cleared by the [place] FDA and  has been authorized for detection and/or diagnosis of SARS-CoV-2 by FDA under an Emergency Use Authorization (EUA). This EUA will remain  in effect (meaning this test can be used) for the duration of the COVID-19 declaration under Section 56 4(b)(1) of the Act, 21 U.S.C. section 360bbb-3(b)(1), unless the authorization is terminated or revoked sooner. Performed at [facility], [place] [address], [place], [place] [address]          Radiology Studies: US Abdomen Limited Ruq  Result Date: 03/15/2019 CLINICAL DATA:  Abnormal LFTs. EXAM: ULTRASOUND ABDOMEN LIMITED RIGHT UPPER QUADRANT COMPARISON:  2.5 mm FINDINGS: Gallbladder: No gallstones or wall thickening visualized. No sonographic Murphy sign noted by sonographer. Common bile duct: Diameter: 2.5 mm Liver: No focal lesion identified. Within normal limits in parenchymal echogenicity. Portal vein is patent on color Doppler imaging with normal direction of blood flow towards the liver. Other: None. IMPRESSION: Negative exam.  No gallstones or biliary distention. Electronically Signed   By: Marcello Moores  Register   On: 03/15/2019 08:00        Scheduled Meds: . diphenhydrAMINE  25 mg Intravenous Q6H  . enoxaparin (LOVENOX) injection  40 mg Subcutaneous Q24H  . folic acid  1 mg Oral Daily  . LORazepam  1 mg Intravenous Q6H  . multivitamin with minerals  1 tablet Oral Daily  . predniSONE  20 mg Oral Q breakfast  . primidone  50 mg Oral TID  . thiamine  100 mg Oral Daily   Continuous  Infusions: . sodium chloride 75 mL/hr at 03/16/19 0713     LOS: 4 days    Time spent: 25 minutes with over 50% of the time coordinating the patient's care    Harold Hedge, DO [facility] Pager [phone]  If 7PM-7AM, please contact night-coverage [email or web address] Password [place] 03/16/2019, 9:42 AM

## 2019-03-16 NOTE — Progress Notes (Signed)
[facility] Gastroenterology Progress Note  Carl Carey 38 y.o. 1980/06/24  CC: Abnormal LFTs, Crohn's disease   Subjective: No acute issues overnight.  Continues to have intermittent diarrhea.  Complaining of lower abdominal discomfort  ROS : Afebrile.  Negative for chest pain   Objective: Vital signs in last 24 hours: Vitals:   03/15/19 2134 03/16/19 0557  BP: 117/71 96/68  Pulse: 84 76  Resp: 16 14  Temp: 98.5 F (36.9 C) 98.5 F (36.9 C)  SpO2: 99% 100%    Physical Exam:  General.  Well-developed, not in acute distress Abdomen soft, nontender, nondistended, bowel sounds present.  No peritoneal signs Neuro.  Alert/oriented x3 Lower extremity.  No edema Lab Results: Recent Labs    03/15/19 0359 03/16/19 0435  NA 136 136  K 3.8 4.3  CL 105 104  CO2 23 26  GLUCOSE 97 87  BUN 6 6  CREATININE 0.83 0.80  CALCIUM 8.2* 7.9*  MG 1.5* 1.9  PHOS 4.3  --    Recent Labs    03/15/19 0359 03/16/19 0435  AST 88* 273*  ALT 53* 150*  ALKPHOS 183* 248*  BILITOT 0.6 0.4  PROT 5.1* 5.1*  ALBUMIN 2.5* 2.3*   Recent Labs    03/15/19 0359 03/16/19 0435  WBC 10.5 7.3  HGB 11.5* 11.4*  HCT 37.0* 36.1*  MCV 84.7 84.1  PLT 472* 384   Recent Labs    03/16/19 0435  LABPROT 13.3  INR 1.0      Assessment/Plan: -Abnormal LFTs.  Could be medication induced.  Hepatitis panel negative.  Ultrasound negative. -Crohn's disease.  Involving rectum and terminal ileum.  Was discharged in September on tapered steroid.  Currently not on any medication.  Was no-show for follow-up  -Extraparametal syndrome   Hepatitis panel negative.  Ultrasound abdomen right upper quadrant today was normal.  CBC showed hemoglobin of 11.5 with mild elevated platelet count of 472.   CT scan in September 2020 showed thickening and luminal narrowing of the 2 segment of the terminal ileum concerning for Crohn's disease.   Recommendations ------------------------- -follow  AMA and  ASMA, QuantiFERON TB Gold and TPMT enzyme activity level.  -LFTs trending up.  -Start low-dose prednisone 20 mg for his Crohn's disease.  May consider starting Imuran for Crohn's disease involving terminal ileum and rectum.  Ideally, he would benefit from biological agent but compliance is an issue.   -GI will follow      Otis Brace MD, [place] 03/16/2019, 9:04 AM  Contact #  [phone]

## 2019-03-16 NOTE — Progress Notes (Signed)
I concur with previous RN assessment documentation.

## 2019-03-17 LAB — CBC
HCT: 34.9 % — ABNORMAL LOW (ref 39.0–52.0)
Hemoglobin: 11 g/dL — ABNORMAL LOW (ref 13.0–17.0)
MCH: 26.4 pg (ref 26.0–34.0)
MCHC: 31.5 g/dL (ref 30.0–36.0)
MCV: 83.7 fL (ref 80.0–100.0)
Platelets: 444 10*3/uL — ABNORMAL HIGH (ref 150–400)
RBC: 4.17 MIL/uL — ABNORMAL LOW (ref 4.22–5.81)
RDW: 14.9 % (ref 11.5–15.5)
WBC: 9 10*3/uL (ref 4.0–10.5)
nRBC: 0 % (ref 0.0–0.2)

## 2019-03-17 LAB — COMPREHENSIVE METABOLIC PANEL
ALT: 138 U/L — ABNORMAL HIGH (ref 0–44)
AST: 156 U/L — ABNORMAL HIGH (ref 15–41)
Albumin: 2.5 g/dL — ABNORMAL LOW (ref 3.5–5.0)
Alkaline Phosphatase: 250 U/L — ABNORMAL HIGH (ref 38–126)
Anion gap: 6 (ref 5–15)
BUN: 6 mg/dL (ref 6–20)
CO2: 28 mmol/L (ref 22–32)
Calcium: 8.1 mg/dL — ABNORMAL LOW (ref 8.9–10.3)
Chloride: 101 mmol/L (ref 98–111)
Creatinine, Ser: 0.76 mg/dL (ref 0.61–1.24)
GFR calc Af Amer: >60 mL/min (ref >60)
GFR calc non Af Amer: >60 mL/min (ref >60)
Glucose, Bld: 94 mg/dL (ref 70–99)
Potassium: 3.9 mmol/L (ref 3.5–5.1)
Sodium: 135 mmol/L (ref 135–145)
Total Bilirubin: 0.6 mg/dL (ref 0.3–1.2)
Total Protein: 5.3 g/dL — ABNORMAL LOW (ref 6.5–8.1)

## 2019-03-17 LAB — CK: Total CK: 78 U/L (ref 49–397)

## 2019-03-17 MED ORDER — TRIHEXYPHENIDYL HCL 2 MG PO TABS
2.0000 mg | ORAL_TABLET | Freq: Two times a day (BID) | ORAL | Status: DC
  Administered 2019-03-17: 2 mg via ORAL
  Filled 2019-03-17: qty 1

## 2019-03-17 NOTE — Progress Notes (Signed)
PROGRESS NOTE    Carl Carey    Code Status: Full Code  WUJ:[id number] DOB: [birth year] DOA: 03/12/2019  PCP: System, Pcp Not In    Hospital Summary  Patient is a 38 year old male with history of bipolar disorder, parkinsonism from antipsychotics in the past, diabetes mellitus, EPS from the medications presented to ED with tremors.  Patient was seen in ED on 11/8 for presumed seizure-like activities.  EMS was called for continued tremors.  He had recent admission on 11/4 for EPS and rhabdomyolysis.  During prior hospitalization, patient was on Abilify Haldol trazodone, Celexa and Cogentin.  Abilify and Celexa was discontinued, Haldol was decreased.  Patient received Benadryl, clonazepam with improvement of his symptoms. ED physician discussed with Dr. Dwyane Dee (psychiatry), recommended IV hydration, stop all antipsychotics and continue Ativan and Benadryl.  In ED, patient was found to be alert, rigidity of the upper and lower extremities, tremors.  Patient's mother did not know what medications patient was currently taking.  Psych has been consulted  A & P   Active Problems:   Parkinsonian features   Dysphagia   Bipolar 1 disorder (HCC)   Extrapyramidal symptom   Rhabdomyolysis   NMS (neuroleptic malignant syndrome)   Encephalopathy acute   Abnormal LFTs   NMS vs. Extrapyramidal symptoms symptoms improved and now resolved. afebrile but with rhabdomyolosis on presentation and rigidity. patient well-known to psychiatry. Continues to have improving but persistent stuttering on exam and muscular rigidity, most notably neck and facial muscles.  On Benadryl every 6 hours with Mysoline.  States he feels unsteady on his feet -Continue benadryl 25 mg q6h -Psych added on Mysoline this hospitalization -Continue Ativan 1 mg every 6 hours -Heating pad to neck -Cannot give muscle relaxers at this time given hepatic impairment -Trazodone, Haldol discontinued -Reconsulted psych for discharge med  recommendations -PT/OT  Crohn's disease involving the rectum and terminal ileum was discharged in September on tapered steroid currently not on any medication and was no-show for follow-up. -GI on board, started on prednisone 20 mg admission and may consider starting Imuran.  Ideally he would benefit from biologic but compliance is an issue. -Recommended to follow-up with GI in 3 to 4 weeks and continue prednisone at current dose until then -Will hold imodium   Acute mild rhabdomyolysis resolved with IV fluids  Dysphagia on dysphagia 3 diet -SLP on board  Oral thrush resolved with nystatin HIV and HBV negative  Bipolar disorder -Appreciate psychiatry recommendations  Transaminitis RUQ ultrasound negative.  Ceruloplasmin negative.  Improved today -follow-up on AMA, ASMA, QuantiFERON-TB gold and TPMT enzyme level  Hypomagnesemia -Replete   DVT prophylaxis: Lovenox Diet: Dysphagia 3 Family Communication: Patient is to update mother by phone Disposition Plan: Likely discharge tomorrow if LFTs improved and pending PT eval  Consultants  Psychiatry Phone consult with neurology GI    Subjective   Admits to persistent tremors and neck stiffness but improved from admission.  States he feels unsteady on his feet.  Denies chest pain, shortness of breath, nausea, vomiting.  Tolerating diet.  No blood in stool.  Remaining 10 point ROS negative  Objective   Vitals:   03/16/19 1333 03/16/19 2121 03/17/19 0510 03/17/19 1307  BP: 104/62 (!) 104/53 103/67 121/65  Pulse: 74 63 (!) 57 84  Resp: 19 18 16 20   Temp: 97.9 F (36.6 C) 98 F (36.7 C) 99.1 F (37.3 C) 98.3 F (36.8 C)  TempSrc: Oral Oral    SpO2: 98% 98% 99% 95%  Weight:  Height:        Intake/Output Summary (Last 24 hours) at 03/17/2019 1809 Last data filed at 03/17/2019 1729 Gross per 24 hour  Intake 2114.03 ml  Output 3580 ml  Net -1465.97 ml   Filed Weights   03/13/19 1436  Weight: 58.4 kg     Examination:  Physical Exam Vitals signs and nursing note reviewed.  Constitutional:      Comments: Still stuttering and with rigidity but improved  HENT:     Head: Normocephalic.     Mouth/Throat:     Mouth: Mucous membranes are moist.  Eyes:     Extraocular Movements: Extraocular movements intact.  Neck:     Comments: Stiff neck muscles Cardiovascular:     Rate and Rhythm: Normal rate and regular rhythm.  Pulmonary:     Effort: Pulmonary effort is normal.     Breath sounds: Normal breath sounds.  Abdominal:     General: Abdomen is flat.     Palpations: Abdomen is soft.  Neurological:     Mental Status: He is alert and oriented to person, place, and time.  Psychiatric:        Mood and Affect: Mood normal.        Behavior: Behavior normal.     Data Reviewed: I have personally reviewed following labs and imaging studies  CBC: Recent Labs  Lab 03/11/19 1904 03/12/19 1150 03/13/19 0535 03/14/19 0430 03/15/19 0359 03/16/19 0435 03/17/19 0443  WBC 11.0* 10.5 7.2 9.2 10.5 7.3 9.0  NEUTROABS 9.4* 8.9*  --   --   --   --   --   HGB 13.2 13.3 11.2* 11.3* 11.5* 11.4* 11.0*  HCT 41.2 41.8 36.0* 35.9* 37.0* 36.1* 34.9*  MCV 81.9 82.8 84.7 84.1 84.7 84.1 83.7  PLT 599* 586* 468* 463* 472* 384 569*   Basic Metabolic Panel: Recent Labs  Lab 03/12/19 1150 03/12/19 2248 03/13/19 0535 03/13/19 0540 03/14/19 0430 03/15/19 0359 03/16/19 0435 03/17/19 0443  NA 137  --   --  137 137 136 136 135  K 3.8  --   --  3.5 3.6 3.8 4.3 3.9  CL 106  --   --  108 109 105 104 101  CO2 21*  --   --  20* 22 23 26 28   GLUCOSE 105*  --   --  70 101* 97 87 94  BUN <5*  --   --  <5* <5* 6 6 6   CREATININE 0.69  --   --  0.76 0.68 0.83 0.80 0.76  CALCIUM 8.9  --   --  7.8* 8.1* 8.2* 7.9* 8.1*  MG 1.6*  --  2.2  --   --  1.5* 1.9  --   PHOS 3.4 2.8 3.0  --   --  4.3  --   --    GFR: Estimated Creatinine Clearance: 105.4 mL/min (by C-G formula based on SCr of 0.76 mg/dL). Liver Function  Tests: Recent Labs  Lab 03/13/19 0540 03/14/19 0430 03/15/19 0359 03/16/19 0435 03/17/19 0443  AST 31 48* 88* 273* 156*  ALT 36 40 53* 150* 138*  ALKPHOS 151* 168* 183* 248* 250*  BILITOT 1.0 0.2* 0.6 0.4 0.6  PROT 5.2* 5.3* 5.1* 5.1* 5.3*  ALBUMIN 2.5* 2.6* 2.5* 2.3* 2.5*   No results for input(s): LIPASE, AMYLASE in the last 168 hours. No results for input(s): AMMONIA in the last 168 hours. Coagulation Profile: Recent Labs  Lab 03/16/19 0435  INR 1.0  Cardiac Enzymes: Recent Labs  Lab 03/13/19 0535 03/14/19 0430 03/15/19 0359 03/16/19 0435 03/17/19 0443  CKTOTAL 662* 397 272 140 78   BNP (last 3 results) No results for input(s): PROBNP in the last 8760 hours. HbA1C: No results for input(s): HGBA1C in the last 72 hours. CBG: Recent Labs  Lab 03/11/19 1953 03/12/19 1150  GLUCAP 101* 93   Lipid Profile: No results for input(s): CHOL, HDL, LDLCALC, TRIG, CHOLHDL, LDLDIRECT in the last 72 hours. Thyroid Function Tests: No results for input(s): TSH, T4TOTAL, FREET4, T3FREE, THYROIDAB in the last 72 hours. Anemia Panel: No results for input(s): VITAMINB12, FOLATE, FERRITIN, TIBC, IRON, RETICCTPCT in the last 72 hours. Sepsis Labs: Recent Labs  Lab 03/11/19 1954  LATICACIDVEN 0.9    Recent Results (from the past 240 hour(s))  SARS CORONAVIRUS 2 (TAT 6-24 HRS) Nasopharyngeal Nasopharyngeal Swab     Status: None   Collection Time: 03/12/19  2:41 PM   Specimen: Nasopharyngeal Swab  Result Value Ref Range Status   SARS Coronavirus 2 NEGATIVE NEGATIVE Final    Comment: (NOTE) SARS-CoV-2 target nucleic acids are NOT DETECTED. The SARS-CoV-2 RNA is generally detectable in upper and lower respiratory specimens during the acute phase of infection. Negative results do not preclude SARS-CoV-2 infection, do not rule out co-infections with other pathogens, and should not be used as the sole basis for treatment or other patient management decisions. Negative results  must be combined with clinical observations, patient history, and epidemiological information. The expected result is Negative. Fact Sheet for Patients: SugarRoll.be Fact Sheet for Healthcare Providers: https://www.woods-mathews.com/ This test is not yet approved or cleared by the [place] FDA and  has been authorized for detection and/or diagnosis of SARS-CoV-2 by FDA under an Emergency Use Authorization (EUA). This EUA will remain  in effect (meaning this test can be used) for the duration of the COVID-19 declaration under Section 56 4(b)(1) of the Act, 21 U.S.C. section 360bbb-3(b)(1), unless the authorization is terminated or revoked sooner. Performed at [facility], [place] [address], [place], [place] [address]          Radiology Studies: No results found.      Scheduled Meds: . diphenhydrAMINE  25 mg Intravenous Q6H  . enoxaparin (LOVENOX) injection  40 mg Subcutaneous Q24H  . folic acid  1 mg Oral Daily  . LORazepam  1 mg Intravenous Q6H  . multivitamin with minerals  1 tablet Oral Daily  . predniSONE  20 mg Oral Q breakfast  . primidone  50 mg Oral TID  . thiamine  100 mg Oral Daily  . trihexyphenidyl  2 mg Oral BID WC   Continuous Infusions: . sodium chloride 75 mL/hr at 03/16/19 2024     LOS: 5 days    Time spent: 20 minutes with over 50% of the time coordinating the patient's care    Harold Hedge, DO [facility] Pager [phone]  If 7PM-7AM, please contact night-coverage [email or web address] Password [facility] 03/17/2019, 6:09 PM

## 2019-03-17 NOTE — Progress Notes (Signed)
Pt lying in bed and c/o feeling "dizzy/woozy". No other sensations.  VSS. Md made aware and orders received. Eulas Post, RN

## 2019-03-17 NOTE — Consult Note (Signed)
Reason for consult: patient with extrapyramidal side effects needs medication adjustment. Case discussed with Dr. Neysa Bonito who reports that patient is currently taking Primidone for EPS secondary to antipsychotic but has not observed any significant improvement. Recommendations: -Continue Primidone 50 mg tid -Add Artane 2 mg bid for EPS/Parkinson-like tremors. -Refer patient to his outpatient psychiatrist after he is medically cleared -Re-consult psych service as needed.  Corena Pilgrim, MD Attending psychiatrist

## 2019-03-17 NOTE — Progress Notes (Signed)
[facility] Gastroenterology Progress Note  Carl Carey 38 y.o. Jan 26, 1981   Subjective: Reports 2-3 loose nonbloody stools overnight (none reported in chart), denies abdominal pain  Objective: Vital signs: Vitals:   03/16/19 2121 03/17/19 0510  BP: (!) 104/53 103/67  Pulse: 63 (!) 57  Resp: 18 16  Temp: 98 F (36.7 C) 99.1 F (37.3 C)  SpO2: 98% 99%    Physical Exam: Gen: lethargic, no acute distress  HEENT: anicteric sclera CV: RRR Chest: CTA B Abd: soft, nontender, nondistended, +BS Ext: no edema  Lab Results: Recent Labs    03/15/19 0359 03/16/19 0435 03/17/19 0443  NA 136 136 135  K 3.8 4.3 3.9  CL 105 104 101  CO2 23 26 28   GLUCOSE 97 87 94  BUN 6 6 6   CREATININE 0.83 0.80 0.76  CALCIUM 8.2* 7.9* 8.1*  MG 1.5* 1.9  --   PHOS 4.3  --   --    Recent Labs    03/16/19 0435 03/17/19 0443  AST 273* 156*  ALT 150* 138*  ALKPHOS 248* 250*  BILITOT 0.4 0.6  PROT 5.1* 5.3*  ALBUMIN 2.3* 2.5*   Recent Labs    03/16/19 0435 03/17/19 0443  WBC 7.3 9.0  HGB 11.4* 11.0*  HCT 36.1* 34.9*  MCV 84.1 83.7  PLT 384 444*      Assessment/Plan: Crohn's disease - stable on Prednisone. Elevated LFTs improving. Continue supportive care. No further GI recs and would keep on current dose of Prednisone until f/u with Dr. Alessandra Bevels (arrange for 3-4 weeks). Will sign off. Call if questions.   [name] 03/17/2019, 11:15 AM  Questions please call [phone] ID: Carl Carey, male   DOB: 1980/09/20, 38 y.o.   MRN: [id number]

## 2019-03-18 LAB — COMPREHENSIVE METABOLIC PANEL
ALT: 127 U/L — ABNORMAL HIGH (ref 0–44)
AST: 122 U/L — ABNORMAL HIGH (ref 15–41)
Albumin: 2.6 g/dL — ABNORMAL LOW (ref 3.5–5.0)
Alkaline Phosphatase: 262 U/L — ABNORMAL HIGH (ref 38–126)
Anion gap: 6 (ref 5–15)
BUN: 10 mg/dL (ref 6–20)
CO2: 27 mmol/L (ref 22–32)
Calcium: 8.1 mg/dL — ABNORMAL LOW (ref 8.9–10.3)
Chloride: 102 mmol/L (ref 98–111)
Creatinine, Ser: 0.69 mg/dL (ref 0.61–1.24)
GFR calc Af Amer: >60 mL/min (ref >60)
GFR calc non Af Amer: >60 mL/min (ref >60)
Glucose, Bld: 100 mg/dL — ABNORMAL HIGH (ref 70–99)
Potassium: 3.9 mmol/L (ref 3.5–5.1)
Sodium: 135 mmol/L (ref 135–145)
Total Bilirubin: 0.5 mg/dL (ref 0.3–1.2)
Total Protein: 5.5 g/dL — ABNORMAL LOW (ref 6.5–8.1)

## 2019-03-18 LAB — QUANTIFERON-TB GOLD PLUS: QuantiFERON-TB Gold Plus: NEGATIVE

## 2019-03-18 LAB — CBC
HCT: 36 % — ABNORMAL LOW (ref 39.0–52.0)
Hemoglobin: 11.3 g/dL — ABNORMAL LOW (ref 13.0–17.0)
MCH: 26.5 pg (ref 26.0–34.0)
MCHC: 31.4 g/dL (ref 30.0–36.0)
MCV: 84.3 fL (ref 80.0–100.0)
Platelets: 449 10*3/uL — ABNORMAL HIGH (ref 150–400)
RBC: 4.27 MIL/uL (ref 4.22–5.81)
RDW: 15.1 % (ref 11.5–15.5)
WBC: 10.7 10*3/uL — ABNORMAL HIGH (ref 4.0–10.5)
nRBC: 0 % (ref 0.0–0.2)

## 2019-03-18 LAB — QUANTIFERON-TB GOLD PLUS (RQFGPL)
QuantiFERON Mitogen Value: 1.15 IU/mL
QuantiFERON Nil Value: 0.09 IU/mL
QuantiFERON TB1 Ag Value: 0.09 IU/mL
QuantiFERON TB2 Ag Value: 0.1 IU/mL

## 2019-03-18 LAB — CK: Total CK: 51 U/L (ref 49–397)

## 2019-03-18 MED ORDER — PRIMIDONE 50 MG PO TABS
50.0000 mg | ORAL_TABLET | Freq: Two times a day (BID) | ORAL | Status: DC
  Administered 2019-03-19: 09:00:00 50 mg via ORAL
  Filled 2019-03-18 (×2): qty 1

## 2019-03-18 MED ORDER — KETOROLAC TROMETHAMINE 15 MG/ML IJ SOLN
15.0000 mg | Freq: Once | INTRAMUSCULAR | Status: AC
  Administered 2019-03-18: 15 mg via INTRAVENOUS
  Filled 2019-03-18: qty 1

## 2019-03-18 NOTE — Evaluation (Addendum)
Physical Therapy Evaluation Patient Details Name: Carl Carey MRN: [id number] DOB: 10-28-1980 Today's Date: 03/18/2019   History of Present Illness  Patient is a 38 year old male with history of bipolar disorder, parkinsonism from antipsychotics in the past, diabetes mellitus, EPS from the medications presented to ED with tremors.  Clinical Impression  Pt admitted with above diagnosis. Pt currently with functional limitations due to the deficits listed below (see PT Problem List). Pt will benefit from skilled PT to increase their independence and safety with mobility to allow discharge to the venue listed below.  Pt close to baseline level, but will follow up in a few days if pt is still in hospital for 1-2 more sessions and higher level balance/gait and HEP.  Pt does report that at home he pretty much stays in bed most of the day. Encouraged to be more active at home and do "laps" throughout the apartment. Pt verbalized understanding. PT spoke with nurse tech and recommended nursing staff walk with pt in halls as able. No follow up PT after d/c from acute.     Follow Up Recommendations No PT follow up    Equipment Recommendations  None recommended by PT    Recommendations for Other Services       Precautions / Restrictions Precautions Precautions: Fall Restrictions Weight Bearing Restrictions: No      Mobility  Bed Mobility Overal bed mobility: Modified Independent       Supine to sit: Modified independent (Device/Increase time) Sit to supine: Modified independent (Device/Increase time)      Transfers Overall transfer level: Modified independent Equipment used: None Transfers: Sit to/from Stand Sit to Stand: Modified independent (Device/Increase time)            Ambulation/Gait Ambulation/Gait assistance: Supervision Gait Distance (Feet): 350 Feet Assistive device: None Gait Pattern/deviations: Step-through pattern;Narrow base of support Gait velocity: WNL    General Gait Details: S with gait with tendency to get distracted by environment, slowing down to try and look in other patient's rooms at times. Decreased arm swing and head turns when cued. complains of fatigue in legs with gait.  Stairs            Wheelchair Mobility    Modified Rankin (Stroke Patients Only)       Balance Overall balance assessment: No apparent balance deficits (not formally assessed)                                           Pertinent Vitals/Pain Pain Assessment: 0-10 Pain Score: 5  Pain Location: neck and shoulders Pain Descriptors / Indicators: Aching Pain Intervention(s): Limited activity within patient's tolerance;Monitored during session    Home Living Family/patient expects to be discharged to:: Private residence Living Arrangements: Parent Available Help at Discharge: Family;Available 24 hours/day Type of Home: Apartment Home Access: Level entry     Home Layout: One level Home Equipment: None Additional Comments: states he lives with his mother but she does not usually help him with anything.    Prior Function Level of Independence: Independent         Comments: Independent with ADLs, reports he does not drive     Hand Dominance   Dominant Hand: Right    Extremity/Trunk Assessment   Upper Extremity Assessment Upper Extremity Assessment: Overall WFL for tasks assessed    Lower Extremity Assessment Lower Extremity Assessment: Overall WFL for tasks assessed  Cervical / Trunk Assessment Cervical / Trunk Assessment: Normal  Communication   Communication: Expressive difficulties  Cognition Arousal/Alertness: Awake/alert Behavior During Therapy: WFL for tasks assessed/performed Overall Cognitive Status: Within Functional Limits for tasks assessed                                        General Comments General comments (skin integrity, edema, etc.): L arm swollen.    Exercises      Assessment/Plan    PT Assessment Patient needs continued PT services  PT Problem List Decreased safety awareness;Decreased activity tolerance       PT Treatment Interventions Gait training;Therapeutic activities;Therapeutic exercise    PT Goals (Current goals can be found in the Care Plan section)  Acute Rehab PT Goals Patient Stated Goal: go home PT Goal Formulation: With patient Time For Goal Achievement: 03/25/19 Potential to Achieve Goals: Good    Frequency Min 2X/week   Barriers to discharge        Co-evaluation               AM-PAC PT "6 Clicks" Mobility  Outcome Measure Help needed turning from your back to your side while in a flat bed without using bedrails?: None Help needed moving from lying on your back to sitting on the side of a flat bed without using bedrails?: None Help needed moving to and from a bed to a chair (including a wheelchair)?: None Help needed standing up from a chair using your arms (e.g., wheelchair or bedside chair)?: None Help needed to walk in hospital room?: A Little Help needed climbing 3-5 steps with a railing? : A Little 6 Click Score: 22    End of Session Equipment Utilized During Treatment: Gait belt Activity Tolerance: Patient tolerated treatment well Patient left: in bed;with bed alarm set;with call bell/phone within reach;Other (comment)(IV team in room) Nurse Communication: Mobility status(nurse tech) PT Visit Diagnosis: Muscle weakness (generalized) (M62.81);Unsteadiness on feet (R26.81)    Time: 5465-0354 PT Time Calculation (min) (ACUTE ONLY): 20 min   Charges:   PT Evaluation $PT Eval Low Complexity: 1 Low          Carl Carey, Carl Carey Pager [phone] 03/18/2019   Carl Carey 03/18/2019, 12:36 PM

## 2019-03-18 NOTE — Progress Notes (Signed)
PROGRESS NOTE    Carl Carey    Code Status: Full Code  KZL:[id number] DOB: Dec 20, 1980 DOA: 03/12/2019  PCP: System, Pcp Not In    Hospital Summary  Patient is a 38 year old male with history of bipolar disorder, Crohn's disease not on medication, parkinsonism from antipsychotics in the past, diabetes mellitus, EPS from the medications presented to ED with tremors.  Patient was seen in ED on 11/8 for presumed seizure-like activities.  EMS was called for continued tremors.  He had recent admission on 11/4 for EPS and rhabdomyolysis.  During prior hospitalization, patient was on Abilify Haldol trazodone, Celexa and Cogentin.  Abilify and Celexa was discontinued, Haldol was decreased.  Patient received Benadryl, clonazepam with improvement of his symptoms. ED physician discussed with Dr. Dwyane Dee (psychiatry), recommended IV hydration, stop all antipsychotics and continue Ativan and Benadryl.  In ED, patient was found to be alert, rigidity of the upper and lower extremities, tremors.  Patient's mother did not know what medications patient was currently taking.  Psych has been consulted started patient on Mysoline and continued every 6 hours Benadryl and Ativan.  Patient was noted to have increasing LFTs during his hospitalization negative right upper quadrant ultrasound and negative acute hepatitis panel and GI was consulted.  Autoimmune work-up was started by GI and he was started on steroids for his Crohn's disease.  Continue to have persistent symptoms and readdressed with psychiatry who started patient on Artane however patient did not tolerate had dizziness.  This was discontinued.  He complained of persistent on steadiness while on his feet and PT was consulted who stated patient did not need further PT/OT  A & P   Active Problems:   Parkinsonian features   Dysphagia   Bipolar 1 disorder (HCC)   Extrapyramidal symptom   Rhabdomyolysis   NMS (neuroleptic malignant syndrome)   Encephalopathy  acute   Abnormal LFTs   NMS vs. Extrapyramidal symptoms improved but not resolved.  Afebrile but with rhabdomyolosis on presentation and rigidity. patient well-known to psychiatry.  On Benadryl and Ativan every 6 hours with Mysoline, did not tolerate Artane which was started by psychiatry on reconsult.  Continues to feel unsteady on his feet.  PT eval today did not recommend PT follow-up.  Dry mouth from Benadryl -Orthostatic no signs -will decrease primidone from 3 times daily to twice daily as this can cause vertigo -Continue Ativan 1 mg every 6 hours -Heating pad to neck -Toradol x1 -Cannot give muscle relaxers at this time given hepatic impairment -Trazodone, Haldol discontinued  Crohn's disease involving the rectum and terminal ileum was discharged in September on tapered steroid currently not on any medication and was no-show for follow-up. -GI started patient on prednisone 20 mg admission and signed off.  Ideally he would benefit from biologic but compliance is an issue. -Recommended to follow-up with GI in 3 to 4 weeks and continue prednisone at current dose until then  Acute mild rhabdomyolysis resolved with IV fluids  Dysphagia on dysphagia 3 diet -SLP on board  Oral thrush resolved with nystatin HIV and HBV negative  Bipolar disorder -Hold trazodone and Haldol -will need outpatient psych follow-up  Transaminitis RUQ ultrasound negative.  Ceruloplasmin and QuantiFERON negative.  Improved today -follow-up on AMA, ASMA and TPMT enzyme level  Hypomagnesemia -Replete   DVT prophylaxis: Lovenox Diet: Dysphagia 3 Family Communication: Patient is to update mother by phone Disposition Plan: Plan to discharge in a.m. if symptomatic improvement with med changes as above  Consultants  Psychiatry  Phone consult with neurology GI    Subjective   Last night patient had feeling of unsteadiness/"woozy "feeling about 1 to 2 hours after taking Artane which was his first dose.   This was discontinued.  Today patient continues to have similar symptoms and physical therapy evaluated patient without any recommendations.  Continues to have neck stiffness and pain otherwise denies any complaints  Objective   Vitals:   03/17/19 1307 03/17/19 1840 03/17/19 2135 03/18/19 0502  BP: 121/65 114/66 124/64 107/64  Pulse: 84 77 74 67  Resp: 20  20 18   Temp: 98.3 F (36.8 C)  98.3 F (36.8 C) 98 F (36.7 C)  TempSrc:   Oral Oral  SpO2: 95% 98% 97% 98%  Weight:      Height:        Intake/Output Summary (Last 24 hours) at 03/18/2019 1713 Last data filed at 03/18/2019 1102 Gross per 24 hour  Intake 2260.87 ml  Output 2425 ml  Net -164.13 ml   Filed Weights   03/13/19 1436  Weight: 58.4 kg    Examination:  Physical Exam Vitals signs reviewed.  HENT:     Head: Normocephalic.     Comments: Hypertonic bilateral masseter    Mouth/Throat:     Mouth: Mucous membranes are dry.  Eyes:     Extraocular Movements: Extraocular movements intact.  Neck:     Musculoskeletal: Normal range of motion. No neck rigidity.  Cardiovascular:     Rate and Rhythm: Normal rate and regular rhythm.  Pulmonary:     Effort: Pulmonary effort is normal.     Breath sounds: Normal breath sounds.  Abdominal:     General: Abdomen is flat.     Palpations: Abdomen is soft.  Musculoskeletal: Normal range of motion.        General: No swelling.     Comments: Muscle strength intact, extremities   Neurological:     Mental Status: He is alert and oriented to person, place, and time.     Comments: Persistent stuttering     Data Reviewed: I have personally reviewed following labs and imaging studies  CBC: Recent Labs  Lab 03/11/19 1904 03/12/19 1150  03/14/19 0430 03/15/19 0359 03/16/19 0435 03/17/19 0443 03/18/19 0440  WBC 11.0* 10.5   < > 9.2 10.5 7.3 9.0 10.7*  NEUTROABS 9.4* 8.9*  --   --   --   --   --   --   HGB 13.2 13.3   < > 11.3* 11.5* 11.4* 11.0* 11.3*  HCT 41.2 41.8    < > 35.9* 37.0* 36.1* 34.9* 36.0*  MCV 81.9 82.8   < > 84.1 84.7 84.1 83.7 84.3  PLT 599* 586*   < > 463* 472* 384 444* 449*   < > = values in this interval not displayed.   Basic Metabolic Panel: Recent Labs  Lab 03/12/19 1150 03/12/19 2248 03/13/19 0535  03/14/19 0430 03/15/19 0359 03/16/19 0435 03/17/19 0443 03/18/19 0440  NA 137  --   --    < > 137 136 136 135 135  K 3.8  --   --    < > 3.6 3.8 4.3 3.9 3.9  CL 106  --   --    < > 109 105 104 101 102  CO2 21*  --   --    < > 22 23 26 28 27   GLUCOSE 105*  --   --    < > 101* 97 87 94 100*  BUN <5*  --   --    < > <5* 6 6 6 10   CREATININE 0.69  --   --    < > 0.68 0.83 0.80 0.76 0.69  CALCIUM 8.9  --   --    < > 8.1* 8.2* 7.9* 8.1* 8.1*  MG 1.6*  --  2.2  --   --  1.5* 1.9  --   --   PHOS 3.4 2.8 3.0  --   --  4.3  --   --   --    < > = values in this interval not displayed.   GFR: Estimated Creatinine Clearance: 105.4 mL/min (by C-G formula based on SCr of 0.69 mg/dL). Liver Function Tests: Recent Labs  Lab 03/14/19 0430 03/15/19 0359 03/16/19 0435 03/17/19 0443 03/18/19 0440  AST 48* 88* 273* 156* 122*  ALT 40 53* 150* 138* 127*  ALKPHOS 168* 183* 248* 250* 262*  BILITOT 0.2* 0.6 0.4 0.6 0.5  PROT 5.3* 5.1* 5.1* 5.3* 5.5*  ALBUMIN 2.6* 2.5* 2.3* 2.5* 2.6*   No results for input(s): LIPASE, AMYLASE in the last 168 hours. No results for input(s): AMMONIA in the last 168 hours. Coagulation Profile: Recent Labs  Lab 03/16/19 0435  INR 1.0   Cardiac Enzymes: Recent Labs  Lab 03/14/19 0430 03/15/19 0359 03/16/19 0435 03/17/19 0443 03/18/19 0440  CKTOTAL 397 272 140 78 51   BNP (last 3 results) No results for input(s): PROBNP in the last 8760 hours. HbA1C: No results for input(s): HGBA1C in the last 72 hours. CBG: Recent Labs  Lab 03/11/19 1953 03/12/19 1150  GLUCAP 101* 93   Lipid Profile: No results for input(s): CHOL, HDL, LDLCALC, TRIG, CHOLHDL, LDLDIRECT in the last 72 hours. Thyroid  Function Tests: No results for input(s): TSH, T4TOTAL, FREET4, T3FREE, THYROIDAB in the last 72 hours. Anemia Panel: No results for input(s): VITAMINB12, FOLATE, FERRITIN, TIBC, IRON, RETICCTPCT in the last 72 hours. Sepsis Labs: Recent Labs  Lab 03/11/19 1954  LATICACIDVEN 0.9    Recent Results (from the past 240 hour(s))  SARS CORONAVIRUS 2 (TAT 6-24 HRS) Nasopharyngeal Nasopharyngeal Swab     Status: None   Collection Time: 03/12/19  2:41 PM   Specimen: Nasopharyngeal Swab  Result Value Ref Range Status   SARS Coronavirus 2 NEGATIVE NEGATIVE Final    Comment: (NOTE) SARS-CoV-2 target nucleic acids are NOT DETECTED. The SARS-CoV-2 RNA is generally detectable in upper and lower respiratory specimens during the acute phase of infection. Negative results do not preclude SARS-CoV-2 infection, do not rule out co-infections with other pathogens, and should not be used as the sole basis for treatment or other patient management decisions. Negative results must be combined with clinical observations, patient history, and epidemiological information. The expected result is Negative. Fact Sheet for Patients: SugarRoll.be Fact Sheet for Healthcare Providers: https://www.woods-mathews.com/ This test is not yet approved or cleared by the [place] FDA and  has been authorized for detection and/or diagnosis of SARS-CoV-2 by FDA under an Emergency Use Authorization (EUA). This EUA will remain  in effect (meaning this test can be used) for the duration of the COVID-19 declaration under Section 56 4(b)(1) of the Act, 21 U.S.C. section 360bbb-3(b)(1), unless the authorization is terminated or revoked sooner. Performed at [facility], [place] [address], [place], [place] [address]          Radiology Studies: No results found.      Scheduled Meds: . diphenhydrAMINE  25 mg Intravenous  Q6H  . enoxaparin (LOVENOX) injection  40 mg  Subcutaneous Q24H  . folic acid  1 mg Oral Daily  . LORazepam  1 mg Intravenous Q6H  . multivitamin with minerals  1 tablet Oral Daily  . predniSONE  20 mg Oral Q breakfast  . primidone  50 mg Oral TID  . thiamine  100 mg Oral Daily   Continuous Infusions: . sodium chloride 75 mL/hr at 03/18/19 0723     LOS: 6 days    Time spent: 25 minutes with over 50% of the time coordinating the patient's care    Harold Hedge, DO [facility] Pager [phone]  If 7PM-7AM, please contact night-coverage [email or web address] Password [email or web address] 03/18/2019, 5:13 PM

## 2019-03-19 LAB — COMPREHENSIVE METABOLIC PANEL
ALT: 135 U/L — ABNORMAL HIGH (ref 0–44)
AST: 129 U/L — ABNORMAL HIGH (ref 15–41)
Albumin: 2.5 g/dL — ABNORMAL LOW (ref 3.5–5.0)
Alkaline Phosphatase: 240 U/L — ABNORMAL HIGH (ref 38–126)
Anion gap: 8 (ref 5–15)
BUN: 9 mg/dL (ref 6–20)
CO2: 25 mmol/L (ref 22–32)
Calcium: 8.5 mg/dL — ABNORMAL LOW (ref 8.9–10.3)
Chloride: 103 mmol/L (ref 98–111)
Creatinine, Ser: 0.65 mg/dL (ref 0.61–1.24)
GFR calc Af Amer: >60 mL/min (ref >60)
GFR calc non Af Amer: >60 mL/min (ref >60)
Glucose, Bld: 104 mg/dL — ABNORMAL HIGH (ref 70–99)
Potassium: 4.1 mmol/L (ref 3.5–5.1)
Sodium: 136 mmol/L (ref 135–145)
Total Bilirubin: 0.2 mg/dL — ABNORMAL LOW (ref 0.3–1.2)
Total Protein: 5.2 g/dL — ABNORMAL LOW (ref 6.5–8.1)

## 2019-03-19 LAB — ANTI-SMOOTH MUSCLE ANTIBODY, IGG: F-Actin IgG: 9 Units (ref 0–19)

## 2019-03-19 LAB — CBC
HCT: 36.9 % — ABNORMAL LOW (ref 39.0–52.0)
Hemoglobin: 11.4 g/dL — ABNORMAL LOW (ref 13.0–17.0)
MCH: 26.4 pg (ref 26.0–34.0)
MCHC: 30.9 g/dL (ref 30.0–36.0)
MCV: 85.4 fL (ref 80.0–100.0)
Platelets: 398 10*3/uL (ref 150–400)
RBC: 4.32 MIL/uL (ref 4.22–5.81)
RDW: 15.1 % (ref 11.5–15.5)
WBC: 12.5 10*3/uL — ABNORMAL HIGH (ref 4.0–10.5)
nRBC: 0 % (ref 0.0–0.2)

## 2019-03-19 LAB — MITOCHONDRIAL ANTIBODIES: Mitochondrial M2 Ab, IgG: <20 Units (ref 0.0–20.0)

## 2019-03-19 LAB — CK: Total CK: 40 U/L — ABNORMAL LOW (ref 49–397)

## 2019-03-19 MED ORDER — LORAZEPAM 1 MG PO TABS: ORAL_TABLET | ORAL | 0 refills | Status: AC

## 2019-03-19 MED ORDER — PREDNISONE 20 MG PO TABS: 20.0000 mg | ORAL_TABLET | Freq: Every day | ORAL | 1 refills | Status: DC

## 2019-03-19 MED ORDER — LORAZEPAM 1 MG PO TABS: 1.0000 mg | ORAL_TABLET | Freq: Two times a day (BID) | ORAL | 0 refills | Status: DC

## 2019-03-19 MED ORDER — PRIMIDONE 50 MG PO TABS: 50.0000 mg | ORAL_TABLET | Freq: Two times a day (BID) | ORAL | 0 refills | Status: DC

## 2019-03-19 MED ORDER — DIPHENHYDRAMINE HCL 25 MG PO TABS: 25.0000 mg | ORAL_TABLET | Freq: Four times a day (QID) | ORAL | 0 refills | Status: DC

## 2019-03-19 MED FILL — PRIMIDONE 50 MG TAB: 50 | 30 days supply | Qty: 60 | Fill #0

## 2019-03-19 MED FILL — predniSONE 20 MG TABS: 20 | 30 days supply | Qty: 30 | Fill #0

## 2019-03-19 NOTE — TOC Progression Note (Signed)
Transition of Care [facility]) - Progression Note    Patient Details  Name: Carl Carey MRN: [id number] Date of Birth: 08-03-1980  Transition of Care [facility] CM/SW Contact  Purcell Mouton, RN Phone Number: 03/19/2019, 3:28 PM  Clinical Narrative:    Your doctor appointment is 12/2 at 1:50 PM which may be virtual. You will recieve a telephone call. Please call [phone] to register for transportation.         Expected Discharge Plan and Services           Expected Discharge Date: 03/19/19                                     Social Determinants of Health (SDOH) Interventions    Readmission Risk Interventions No flowsheet data found.

## 2019-03-19 NOTE — Discharge Summary (Addendum)
Physician Discharge Summary  Carl Carey PHK:[id number] DOB: 06-Apr-1981 DOA: 03/12/2019  PCP: System, Pcp Not In  Admit date: 03/12/2019 Discharge date: 03/19/2019   Code Status: Full Code  Admitted From: Home Discharged to: [place]: No Equipment/Devices: No Discharge Condition: Improving  Recommendations for Outpatient Follow-up   1. Appointment made for patient to establish care with new PCP. Follow up with PCP in 1 week 2. Please follow up CMP/CBC  3. Needs psychiatry referral for medication adjustment and bipolar with extraparametal symptoms from medication 4. Needs to follow-up with GI regarding Crohn's 5. Taper off Benadryl and Ativan 6. Continue prednisone 20 mg until GI appointment  Hospital Summary  Patient is a 38 year old male with history of bipolar disorder, Crohn's disease not on medication, parkinsonism from antipsychotics in the past, diabetes mellitus, EPS from the medications presented to ED with tremors. Patient was seen in ED on 11/8 for presumed seizure-like activities. EMS was called for continued tremors. He had recent admission on 11/4 for EPS and rhabdomyolysis. During prior hospitalization, patient was on Abilify Haldol trazodone, Celexa and Cogentin. Abilify and Celexa was discontinued, Haldol was decreased. Patient received Benadryl, clonazepam with improvement of his symptoms. ED physician discussed with Dr. Ferne Reus), recommended IV hydration, stop all antipsychotics and continue Ativan and Benadryl.  In ED, patient was found to be alert, rigidity of the upper and lower extremities, tremors. Patient's mother did not know what medications patient was currently taking.  Psych has been consulted started patient on Mysoline and continued every 6 hours Benadryl and Ativan.  Patient was noted to have increasing LFTs during his hospitalization negative right upper quadrant ultrasound and negative acute hepatitis panel and GI was consulted.   Autoimmune work-up was started by GI and he was started on steroids for his Crohn's disease.  Continue to have persistent symptoms and readdressed with psychiatry who started patient on Artane however patient did not tolerate had dizziness.  This was discontinued.  He complained of persistent on steadiness while on his feet and PT was consulted who stated patient did not need further PT/OT.  Orthostatics negative.  Primidone was decreased to twice daily and patient had improvement in symptoms day of discharge.  A & P   Active Problems:   Parkinsonian features   Dysphagia   Bipolar 1 disorder (HCC)   Extrapyramidal symptom   Rhabdomyolysis   NMS (neuroleptic malignant syndrome)   Encephalopathy acute   Abnormal LFTs   Medication induced NMS vs. Extrapyramidal symptoms improved but not resolved.  Afebrile but with rhabdomyolosis on presentation and rigidity. patient well-known to psychiatry.  On Benadryl and Ativan every 6 hours. Mysoline added initially 3 times daily that was decreased to twice daily 11/15 for dizziness, did not tolerate Artane which was started by psychiatry on reconsult.  PT eval today did not recommend PT follow-up.  Dry mouth from Benadryl.  Negative orthostatic signs -Continue decreased primidone from 3 times daily to twice daily as this can cause vertigo -Continue Ativan 1 mg bid x 1 week then 1 mg daily then stop -Cannot give muscle relaxers at this time given hepatic impairment can consider once liver function improves -Trazodone, Haldol discontinued  Crohn's disease involving the rectum and terminal ileum was discharged in September on tapered steroid currently not on any medication and was no-show for follow-up. -GI started patient on prednisone 20 mg admission and signed off.  Ideally he would benefit from biologic but compliance is an issue. -Recommended to follow-up with GI in 3  to 4 weeks and continue prednisone at current dose until then  Acute mild  rhabdomyolysis resolved with IV fluids  Dysphagia on dysphagia 3 diet -Continue soft diet at home  Oral thrush resolved with nystatin HIV and HBV negative  Bipolar disorder -Hold trazodone and Haldol -will need outpatient psych follow-up  Transaminitis RUQ ultrasound negative.  Ceruloplasmin and QuantiFERON negative.  Improved today.  May be medication induced -follow-up on AMA, ASMA and TPMT enzyme level -Follow-up CMP outpatient -Follow-up with GI  Hypomagnesemia -Replete    Consultants  . GI . Psychiatry  Procedures  . None  Antibiotics  None   Subjective  Patient seen and examined lying flat and resting comfortably.  Complains of persistent neck stiffness which has been relatively unchanged during hospitalization.  States his "wooziness "has improved with change in medication.  I explained to him that the symptoms may last for a long time and there is no other inpatient work-up that is needed.  He will need to follow-up with his outpatient providers for further management.  Patient understood.  Currently denies any chest pain, shortness of breath, nausea, vomiting, diarrhea.  Remaining ROS negative   Objective   Discharge Exam: Vitals:   03/19/19 0639 03/19/19 1252  BP: 111/77 124/66  Pulse: 87 86  Resp:  20  Temp:  (!) 97.5 F (36.4 C)  SpO2: 98% 96%   Vitals:   03/18/19 2035 03/19/19 0634 03/19/19 0639 03/19/19 1252  BP: 113/69  111/77 124/66  Pulse: 72 66 87 86  Resp: 18 18  20   Temp: 98 F (36.7 C) 98.3 F (36.8 C)  (!) 97.5 F (36.4 C)  TempSrc: Oral Oral  Oral  SpO2: 97% 97% 98% 96%  Weight:      Height:        Physical Exam Vitals signs and nursing note reviewed.  Constitutional:      Comments: Persistent stuttering No tremor noted  HENT:     Head: Normocephalic and atraumatic.     Nose: Nose normal.     Mouth/Throat:     Mouth: Mucous membranes are moist.  Eyes:     Extraocular Movements: Extraocular movements intact.  Neck:      Comments: Persistent neck hypertonicity Cardiovascular:     Rate and Rhythm: Normal rate and regular rhythm.  Pulmonary:     Effort: Pulmonary effort is normal.     Breath sounds: Normal breath sounds.  Abdominal:     General: Abdomen is flat.     Palpations: Abdomen is soft.  Musculoskeletal: Normal range of motion.        General: No swelling.  Neurological:     Mental Status: He is alert and oriented to person, place, and time.  Psychiatric:        Mood and Affect: Mood normal.        Behavior: Behavior normal.        Thought Content: Thought content normal.       The results of significant diagnostics from this hospitalization (including imaging, microbiology, ancillary and laboratory) are listed below for reference.     Microbiology: Recent Results (from the past 240 hour(s))  SARS CORONAVIRUS 2 (TAT 6-24 HRS) Nasopharyngeal Nasopharyngeal Swab     Status: None   Collection Time: 03/12/19  2:41 PM   Specimen: Nasopharyngeal Swab  Result Value Ref Range Status   SARS Coronavirus 2 NEGATIVE NEGATIVE Final    Comment: (NOTE) SARS-CoV-2 target nucleic acids are NOT DETECTED. The SARS-CoV-2 RNA  is generally detectable in upper and lower respiratory specimens during the acute phase of infection. Negative results do not preclude SARS-CoV-2 infection, do not rule out co-infections with other pathogens, and should not be used as the sole basis for treatment or other patient management decisions. Negative results must be combined with clinical observations, patient history, and epidemiological information. The expected result is Negative. Fact Sheet for Patients: SugarRoll.be Fact Sheet for Healthcare Providers: https://www.woods-mathews.com/ This test is not yet approved or cleared by the [place] FDA and  has been authorized for detection and/or diagnosis of SARS-CoV-2 by FDA under an Emergency Use Authorization (EUA).  This EUA will remain  in effect (meaning this test can be used) for the duration of the COVID-19 declaration under Section 56 4(b)(1) of the Act, 21 U.S.C. section 360bbb-3(b)(1), unless the authorization is terminated or revoked sooner. Performed at [facility], [place] [address], [place], [place] [address]      Labs: BNP (last 3 results) No results for input(s): BNP in the last 8760 hours. Basic Metabolic Panel: Recent Labs  Lab 03/12/19 2248 03/13/19 0535  03/15/19 0359 03/16/19 0435 03/17/19 0443 03/18/19 0440 03/19/19 0324  NA  --   --    < > 136 136 135 135 136  K  --   --    < > 3.8 4.3 3.9 3.9 4.1  CL  --   --    < > 105 104 101 102 103  CO2  --   --    < > 23 26 28 27 25   GLUCOSE  --   --    < > 97 87 94 100* 104*  BUN  --   --    < > 6 6 6 10 9   CREATININE  --   --    < > 0.83 0.80 0.76 0.69 0.65  CALCIUM  --   --    < > 8.2* 7.9* 8.1* 8.1* 8.5*  MG  --  2.2  --  1.5* 1.9  --   --   --   PHOS 2.8 3.0  --  4.3  --   --   --   --    < > = values in this interval not displayed.   Liver Function Tests: Recent Labs  Lab 03/15/19 0359 03/16/19 0435 03/17/19 0443 03/18/19 0440 03/19/19 0324  AST 88* 273* 156* 122* 129*  ALT 53* 150* 138* 127* 135*  ALKPHOS 183* 248* 250* 262* 240*  BILITOT 0.6 0.4 0.6 0.5 0.2*  PROT 5.1* 5.1* 5.3* 5.5* 5.2*  ALBUMIN 2.5* 2.3* 2.5* 2.6* 2.5*   No results for input(s): LIPASE, AMYLASE in the last 168 hours. No results for input(s): AMMONIA in the last 168 hours. CBC: Recent Labs  Lab 03/15/19 0359 03/16/19 0435 03/17/19 0443 03/18/19 0440 03/19/19 0324  WBC 10.5 7.3 9.0 10.7* 12.5*  HGB 11.5* 11.4* 11.0* 11.3* 11.4*  HCT 37.0* 36.1* 34.9* 36.0* 36.9*  MCV 84.7 84.1 83.7 84.3 85.4  PLT 472* 384 444* 449* 398   Cardiac Enzymes: Recent Labs  Lab 03/15/19 0359 03/16/19 0435 03/17/19 0443 03/18/19 0440 03/19/19 0324  CKTOTAL 272 140 78 51 40*   BNP: Invalid input(s): POCBNP CBG: No results for input(s):  GLUCAP in the last 168 hours. D-Dimer No results for input(s): DDIMER in the last 72 hours. Hgb A1c No results for input(s): HGBA1C in the last 72 hours. Lipid Profile No results for input(s): CHOL, HDL, LDLCALC, TRIG, CHOLHDL,  LDLDIRECT in the last 72 hours. Thyroid function studies No results for input(s): TSH, T4TOTAL, T3FREE, THYROIDAB in the last 72 hours.  Invalid input(s): FREET3 Anemia work up No results for input(s): VITAMINB12, FOLATE, FERRITIN, TIBC, IRON, RETICCTPCT in the last 72 hours. Urinalysis    Component Value Date/Time   COLORURINE YELLOW 03/12/2019 1357   APPEARANCEUR CLEAR 03/12/2019 1357   LABSPEC 1.019 03/12/2019 1357   PHURINE 5.0 03/12/2019 1357   GLUCOSEU NEGATIVE 03/12/2019 1357   HGBUR NEGATIVE 03/12/2019 1357   BILIRUBINUR NEGATIVE 03/12/2019 1357   KETONESUR 20 (A) 03/12/2019 1357   PROTEINUR NEGATIVE 03/12/2019 1357   UROBILINOGEN 0.2 11/17/2014 1930   NITRITE NEGATIVE 03/12/2019 1357   LEUKOCYTESUR NEGATIVE 03/12/2019 1357   Sepsis Labs Invalid input(s): PROCALCITONIN,  WBC,  LACTICIDVEN Microbiology Recent Results (from the past 240 hour(s))  SARS CORONAVIRUS 2 (TAT 6-24 HRS) Nasopharyngeal Nasopharyngeal Swab     Status: None   Collection Time: 03/12/19  2:41 PM   Specimen: Nasopharyngeal Swab  Result Value Ref Range Status   SARS Coronavirus 2 NEGATIVE NEGATIVE Final    Comment: (NOTE) SARS-CoV-2 target nucleic acids are NOT DETECTED. The SARS-CoV-2 RNA is generally detectable in upper and lower respiratory specimens during the acute phase of infection. Negative results do not preclude SARS-CoV-2 infection, do not rule out co-infections with other pathogens, and should not be used as the sole basis for treatment or other patient management decisions. Negative results must be combined with clinical observations, patient history, and epidemiological information. The expected result is Negative. Fact Sheet for  Patients: SugarRoll.be Fact Sheet for Healthcare Providers: https://www.woods-mathews.com/ This test is not yet approved or cleared by the [place] FDA and  has been authorized for detection and/or diagnosis of SARS-CoV-2 by FDA under an Emergency Use Authorization (EUA). This EUA will remain  in effect (meaning this test can be used) for the duration of the COVID-19 declaration under Section 56 4(b)(1) of the Act, 21 U.S.C. section 360bbb-3(b)(1), unless the authorization is terminated or revoked sooner. Performed at [facility], [place] [address], [place], [place] [address]     Discharge Instructions     Discharge Instructions    Diet - low sodium heart healthy   Complete by: As directed    Discharge instructions   Complete by: As directed    You were seen and examined in the hospital for muscle stiffness from your medications and cared for by a hospitalist, psychiatrist and a gastroenterologist after having abnormal liver function and to treat your Crohn's disease  Upon Discharge:  -Take Benadryl 25 mg every 6 hours -Take prednisone 20 mg daily -Take primidone 50 mg twice daily -Take Ativan as prescribed for the next week -Establish care and follow-up with your primary care physician within the next 7 days and get lab work prior to your visit  -You will need to make an appointment with a psychiatrist in the next 2 to 4 weeks -You will need to set up an appointment with your gastroenterologist for your Crohn's disease Bring all home medications to your appointment to review Request that your primary physician go over all hospital tests and procedures/radiological results at the follow up.   Please get all hospital records sent to your physician by signing a hospital release before you go home.     Read the complete instructions along with all the possible side effects for all the medicines you take and that have been  prescribed to you. Take any new  medicines after you have completely understood and accept all the possible adverse reactions/side effects.   If you have any questions about your discharge medications or the care you received while you were in the hospital, you can call the unit and asked to speak with the hospitalist on call. Once you are discharged, your primary care physician will handle any further medical issues. Please note that NO REFILLS for any discharge medications will be authorized, as it is imperative that you return to your primary care physician (or establish a relationship with a primary care physician if you do not have one) for your aftercare needs so that they can reassess your need for medications and monitor your lab values.   Do not drive, operate heavy machinery, perform activities at heights, swimming or participation in water activities or provide baby sitting services if your were admitted for loss of consciousness/seizures or if you are on sedating medications including, but not limited to benzodiazepines, sleep medications, narcotic pain medications, etc., until you have been cleared to do so by a medical doctor.   Do not take more than prescribed medications.   Wear a seat belt while driving.  If you have smoked or chewed Tobacco in the last 2 years please stop smoking; also stop any regular Alcohol and/or any Recreational drug use including marijuana.  If you experience worsening of your admission symptoms or develop shortness of breath, chest pain, suicidal or homicidal thoughts or experience a life threatening emergency, you must seek medical attention immediately by calling 911 or calling your PCP immediately.   Increase activity slowly   Complete by: As directed      Allergies as of 03/19/2019   No Known Allergies     Medication List    STOP taking these medications   benztropine 1 MG tablet Commonly known as: COGENTIN   haloperidol 10 MG tablet Commonly  known as: HALDOL   loperamide 2 MG capsule Commonly known as: IMODIUM   potassium chloride SA 20 MEQ tablet Commonly known as: KLOR-CON     TAKE these medications   diphenhydrAMINE 25 MG tablet Commonly known as: BENADRYL Take 1 tablet (25 mg total) by mouth every 6 (six) hours.   LORazepam 1 MG tablet Commonly known as: Ativan Take 1 tablet (1 mg total) by mouth 2 (two) times daily for 7 days, THEN 1 tablet (1 mg total) daily for 7 days. Start taking on: March 19, 2019   pantoprazole 40 MG tablet Commonly known as: PROTONIX Take 1 tablet (40 mg total) by mouth 2 (two) times daily.   predniSONE 20 MG tablet Commonly known as: DELTASONE Take 1 tablet (20 mg total) by mouth daily with breakfast.   primidone 50 MG tablet Commonly known as: MYSOLINE Take 1 tablet (50 mg total) by mouth 2 (two) times daily.       No Known Allergies  Time coordinating discharge: Over 30 minutes   SIGNED:   Harold Hedge, D.O. [facility] Pager: [phone]  03/19/2019, 1:52 PM

## 2019-03-20 ENCOUNTER — Encounter: Payer: Self-pay | Admitting: *Deleted

## 2019-03-21 LAB — THIOPURINE METHYLTRANSFERASE (TPMT), RBC: TPMT Activity:: 26.2 Units/mL RBC

## 2019-03-27 ENCOUNTER — Other Ambulatory Visit: Payer: Self-pay

## 2019-03-27 ENCOUNTER — Emergency Department (HOSPITAL_COMMUNITY)
Admission: EM | Admit: 2019-03-27 | Discharge: 2019-03-27 | Disposition: A | Discharge status: Home / Self Care | Payer: Medicaid Other | Attending: Emergency Medicine | Admitting: Emergency Medicine

## 2019-03-27 ENCOUNTER — Encounter (HOSPITAL_COMMUNITY): Payer: Self-pay | Admitting: Obstetrics and Gynecology

## 2019-03-27 ENCOUNTER — Emergency Department (HOSPITAL_COMMUNITY): Payer: Medicaid Other

## 2019-03-27 DIAGNOSIS — G4700 Insomnia, unspecified: Secondary | ICD-10-CM | POA: Diagnosis not present

## 2019-03-27 DIAGNOSIS — F17210 Nicotine dependence, cigarettes, uncomplicated: Secondary | ICD-10-CM | POA: Insufficient documentation

## 2019-03-27 DIAGNOSIS — E119 Type 2 diabetes mellitus without complications: Secondary | ICD-10-CM | POA: Insufficient documentation

## 2019-03-27 DIAGNOSIS — Z79899 Other long term (current) drug therapy: Secondary | ICD-10-CM | POA: Insufficient documentation

## 2019-03-27 DIAGNOSIS — G20 Parkinson's disease: Secondary | ICD-10-CM | POA: Insufficient documentation

## 2019-03-27 LAB — BASIC METABOLIC PANEL
Anion gap: 11 (ref 5–15)
BUN: 8 mg/dL (ref 6–20)
CO2: 20 mmol/L — ABNORMAL LOW (ref 22–32)
Calcium: 8.2 mg/dL — ABNORMAL LOW (ref 8.9–10.3)
Chloride: 99 mmol/L (ref 98–111)
Creatinine, Ser: 0.79 mg/dL (ref 0.61–1.24)
GFR calc Af Amer: >60 mL/min (ref >60)
GFR calc non Af Amer: >60 mL/min (ref >60)
Glucose, Bld: 127 mg/dL — ABNORMAL HIGH (ref 70–99)
Potassium: 2.9 mmol/L — ABNORMAL LOW (ref 3.5–5.1)
Sodium: 130 mmol/L — ABNORMAL LOW (ref 135–145)

## 2019-03-27 LAB — CK: Total CK: 51 U/L (ref 49–397)

## 2019-03-27 LAB — URINALYSIS, ROUTINE W REFLEX MICROSCOPIC
Bacteria, UA: NONE SEEN
Bilirubin Urine: NEGATIVE
Glucose, UA: NEGATIVE mg/dL
Ketones, ur: NEGATIVE mg/dL
Leukocytes,Ua: NEGATIVE
Nitrite: NEGATIVE
Protein, ur: NEGATIVE mg/dL
Specific Gravity, Urine: 1.003 — ABNORMAL LOW (ref 1.005–1.030)
pH: 6 (ref 5.0–8.0)

## 2019-03-27 LAB — HEPATIC FUNCTION PANEL
ALT: 35 U/L (ref 0–44)
AST: 17 U/L (ref 15–41)
Albumin: 3.6 g/dL (ref 3.5–5.0)
Alkaline Phosphatase: 165 U/L — ABNORMAL HIGH (ref 38–126)
Bilirubin, Direct: <0.1 mg/dL (ref 0.0–0.2)
Total Bilirubin: 0.2 mg/dL — ABNORMAL LOW (ref 0.3–1.2)
Total Protein: 6.8 g/dL (ref 6.5–8.1)

## 2019-03-27 LAB — CBC
HCT: 46.3 % (ref 39.0–52.0)
Hemoglobin: 14.3 g/dL (ref 13.0–17.0)
MCH: 26.1 pg (ref 26.0–34.0)
MCHC: 30.9 g/dL (ref 30.0–36.0)
MCV: 84.5 fL (ref 80.0–100.0)
Platelets: 595 10*3/uL — ABNORMAL HIGH (ref 150–400)
RBC: 5.48 MIL/uL (ref 4.22–5.81)
RDW: 15 % (ref 11.5–15.5)
WBC: 17.5 10*3/uL — ABNORMAL HIGH (ref 4.0–10.5)
nRBC: 0 % (ref 0.0–0.2)

## 2019-03-27 MED ORDER — POTASSIUM CHLORIDE CRYS ER 20 MEQ PO TBCR
40.0000 meq | EXTENDED_RELEASE_TABLET | Freq: Once | ORAL | Status: AC
  Administered 2019-03-27: 40 meq via ORAL
  Filled 2019-03-27: qty 2

## 2019-03-27 MED ORDER — SODIUM CHLORIDE 0.9% FLUSH
3.0000 mL | Freq: Once | INTRAVENOUS | Status: AC
  Administered 2019-03-27: 3 mL via INTRAVENOUS

## 2019-03-27 MED ORDER — SODIUM CHLORIDE 0.9 % IV SOLN
INTRAVENOUS | Status: DC
  Administered 2019-03-27: 22:00:00 via INTRAVENOUS

## 2019-03-27 MED ORDER — SODIUM CHLORIDE 0.9 % IV BOLUS
2000.0000 mL | Freq: Once | INTRAVENOUS | Status: AC
  Administered 2019-03-27: 2000 mL via INTRAVENOUS

## 2019-03-27 NOTE — ED Provider Notes (Signed)
[facility] DEPT Provider Note   CSN: [id number] Arrival date & time: 03/27/19  1531     History   Chief Complaint Chief Complaint  Patient presents with   Insomnia   Tachycardia    HPI Carl Carey is a 38 y.o. male.     38 year old male who presents with worsening chronic insomnia.  States he has had insomnia for over a year.  Was discharged from the hospital 2 weeks ago after a stay for possible NMS.  At time of discharge patient was having parkinsonian-like symptoms which are continuing.  He denies SI or HI.  States compliance with his medications.  Feels that he has some swelling around his face but denies any pruritus.  No trouble with his vision.     Past Medical History:  Diagnosis Date   Bipolar 1 disorder ([place])    Diabetes ([place])    Malnutrition ([place])    Panic attack    Parkinsonism [facility])     Patient Active Problem List   Diagnosis Date Noted   Abnormal LFTs 03/14/2019   Encephalopathy acute 03/13/2019   NMS (neuroleptic malignant syndrome) 03/12/2019   Rhabdomyolysis 03/07/2019   Nausea with vomiting 03/06/2019   Extrapyramidal symptom 03/03/2019   Bipolar 1 disorder ([place]) 11/09/2018   Parkinsonian features 07/21/2014   Dysphagia 07/21/2014    Past Surgical History:  Procedure Laterality Date   BIOPSY  01/21/2019   Procedure: BIOPSY;  Surgeon: Otis Brace, MD;  Location: [facility];  Service: Gastroenterology;;   COLONOSCOPY WITH PROPOFOL N/A 01/21/2019   Procedure: COLONOSCOPY WITH PROPOFOL;  Surgeon: Otis Brace, MD;  Location: [facility];  Service: Gastroenterology;  Laterality: N/A;   ESOPHAGOGASTRODUODENOSCOPY (EGD) WITH PROPOFOL N/A 01/21/2019   Procedure: ESOPHAGOGASTRODUODENOSCOPY (EGD) WITH PROPOFOL;  Surgeon: Otis Brace, MD;  Location: [facility];  Service: Gastroenterology;  Laterality: N/A;   MULTIPLE EXTRACTIONS WITH ALVEOLOPLASTY N/A 07/29/2014   Procedure:  Extraction of tooth #'s 1,15,16,17,18 with alveoloplasty and gross debridement of teeth;  Surgeon: Lenn Cal, DDS;  Location: [facility];  Service: Oral Surgery;  Laterality: N/A;   NECK SURGERY          Home Medications    Prior to Admission medications   Medication Sig Start Date End Date Taking? Authorizing Provider  diphenhydrAMINE (BENADRYL) 25 MG tablet Take 1 tablet (25 mg total) by mouth every 6 (six) hours. 03/19/19 04/18/19 Yes Harold Hedge, MD  pantoprazole (PROTONIX) 40 MG tablet Take 1 tablet (40 mg total) by mouth 2 (two) times daily. 02/23/19  Yes Fulp, Cammie, MD  predniSONE (DELTASONE) 20 MG tablet Take 1 tablet (20 mg total) by mouth daily with breakfast. 03/19/19  Yes Harold Hedge, MD  primidone (MYSOLINE) 50 MG tablet Take 1 tablet (50 mg total) by mouth 2 (two) times daily. 03/19/19  Yes Harold Hedge, MD  LORazepam (ATIVAN) 1 MG tablet Take 1 tablet (1 mg total) by mouth 2 (two) times daily for 7 days, THEN 1 tablet (1 mg total) daily for 7 days. 03/19/19 04/02/19  Harold Hedge, MD    Family History Family History  Problem Relation Age of Onset   Hypertension Maternal Grandmother     Social History Social History   Tobacco Use   Smoking status: Current Some Day Smoker    Types: Cigarettes   Smokeless tobacco: Never Used  Substance Use Topics   Alcohol use: Yes    Alcohol/week: 5.0 standard drinks    Types: 5 Standard drinks  or equivalent per week    Comment: daily   Drug use: No     Allergies   Patient has no known allergies.   Review of Systems Review of Systems  All other systems reviewed and are negative.    Physical Exam Updated Vital Signs BP 128/75 (BP Location: Right Arm)    Pulse (!) 102    Temp 99.9 F (37.7 C) (Oral)    Resp (!) 21    SpO2 97%   Physical Exam Vitals signs and nursing note reviewed.  Constitutional:      General: He is not in acute distress.    Appearance: Normal appearance. He is  well-developed. He is not toxic-appearing.  HENT:     Head: Normocephalic and atraumatic.  Eyes:     General: Lids are normal.     Conjunctiva/sclera: Conjunctivae normal.     Pupils: Pupils are equal, round, and reactive to light.  Neck:     Musculoskeletal: Normal range of motion and neck supple.     Thyroid: No thyroid mass.     Trachea: No tracheal deviation.  Cardiovascular:     Rate and Rhythm: Normal rate and regular rhythm.     Heart sounds: Normal heart sounds. No murmur. No gallop.   Pulmonary:     Effort: Pulmonary effort is normal. No respiratory distress.     Breath sounds: Normal breath sounds. No stridor. No decreased breath sounds, wheezing, rhonchi or rales.  Abdominal:     General: Bowel sounds are normal. There is no distension.     Palpations: Abdomen is soft.     Tenderness: There is no abdominal tenderness. There is no rebound.  Musculoskeletal: Normal range of motion.        General: No tenderness.  Skin:    General: Skin is warm and dry.     Findings: No abrasion or rash.  Neurological:     Mental Status: He is alert and oriented to person, place, and time.     GCS: GCS eye subscore is 4. GCS verbal subscore is 5. GCS motor subscore is 6.     Cranial Nerves: No cranial nerve deficit.     Sensory: No sensory deficit.     Motor: Tremor present.  Psychiatric:        Attention and Perception: Attention normal.        Speech: Speech is rapid and pressured.        Behavior: Behavior is hyperactive.      ED Treatments / Results  Labs (all labs ordered are listed, but only abnormal results are displayed) Labs Reviewed  BASIC METABOLIC PANEL - Abnormal; Notable for the following components:      Result Value   Sodium 130 (*)    Potassium 2.9 (*)    CO2 20 (*)    Glucose, Bld 127 (*)    Calcium 8.2 (*)    All other components within normal limits  CBC - Abnormal; Notable for the following components:   WBC 17.5 (*)    Platelets 595 (*)    All other  components within normal limits  URINALYSIS, ROUTINE W REFLEX MICROSCOPIC - Abnormal; Notable for the following components:   Color, Urine STRAW (*)    Specific Gravity, Urine 1.003 (*)    Hgb urine dipstick SMALL (*)    All other components within normal limits  CK  HEPATIC FUNCTION PANEL    EKG None  Radiology Dg Chest 2 View  Result Date:  03/27/2019 CLINICAL DATA:  [place] EXAM: CHEST - 2 VIEW COMPARISON:  03/11/2019 FINDINGS: The heart size and mediastinal contours are within normal limits. Both lungs are clear. The visualized skeletal structures are unremarkable. IMPRESSION: No active cardiopulmonary disease. Electronically Signed   By: Davina Poke M.D.   On: 03/27/2019 17:18    Procedures Procedures (including critical care time)  Medications Ordered in ED Medications  sodium chloride flush (NS) 0.9 % injection 3 mL (has no administration in time range)  sodium chloride 0.9 % bolus 2,000 mL (has no administration in time range)  0.9 %  sodium chloride infusion (has no administration in time range)     Initial Impression / Assessment and Plan / ED Course  I have reviewed the triage vital signs and the nursing notes.  Pertinent labs & imaging results that were available during my care of the patient were reviewed by me and considered in my medical decision making (see chart for details).        Mild leukocytosis noted but patient is afebrile.  Mild hypokalemia treated with oral potassium.  Renal function is stable.  Repeat LFTs are much improved from prior.  Patient had rhabdomyolysis when he was admitted recently and repeat CK shows improvement.  Review the old chart shows that when patient was discharged he had similar symptoms as he does tonight with regards to his parkinsonian movements.  Will discharge home  Final Clinical Impressions(s) / ED Diagnoses   Final diagnoses:  None    ED Discharge Orders    None       Lacretia Leigh, MD 03/27/19 2249

## 2019-03-27 NOTE — ED Triage Notes (Signed)
Patient reports that he is having insomnia and feels like under his eyes is swollen.

## 2019-03-27 NOTE — Discharge Instructions (Addendum)
Follow-up with your mental health provider

## 2019-04-04 ENCOUNTER — Other Ambulatory Visit: Payer: Self-pay

## 2019-04-04 ENCOUNTER — Ambulatory Visit: Payer: Medicaid Other | Attending: Family Medicine | Admitting: Family Medicine

## 2019-04-09 ENCOUNTER — Encounter: Payer: Self-pay | Admitting: Pulmonary Disease

## 2019-04-09 ENCOUNTER — Ambulatory Visit (INDEPENDENT_AMBULATORY_CARE_PROVIDER_SITE_OTHER): Payer: Medicaid Other | Admitting: Pulmonary Disease

## 2019-04-09 ENCOUNTER — Other Ambulatory Visit: Payer: Self-pay

## 2019-04-09 VITALS — BP 110/70 | HR 106 | Temp 97.0°F | Ht 68.0 in | Wt 150.8 lb

## 2019-04-09 DIAGNOSIS — F5105 Insomnia due to other mental disorder: Secondary | ICD-10-CM | POA: Diagnosis not present

## 2019-04-09 MED ORDER — ZOLPIDEM TARTRATE 10 MG PO TABS: 10.0000 mg | ORAL_TABLET | Freq: Every evening | ORAL | 0 refills | Status: DC | PRN

## 2019-04-09 NOTE — Progress Notes (Signed)
Carl Carey    [id number]    [birth year]  Primary Care Physician:Fulp, Ander Gaster, MD  Referring Physician: No referring provider defined for this encounter.  Chief complaint:   Patient with a history of insomnia  HPI: Insomnia for about 1 year  Not been able to sleep much  Does have a history of bipolar disorder/mania  Speech pressure, stutter  Denies snoring No significant increase in weight  No dryness of his mouth in the mornings Occasional headaches in the mornings  Just not been able to sleep much Could not get significant information regarding his sleep habits-he keeps maintain and is not getting much sleep at all  Denies being sleepy during the day  Outpatient Encounter Medications as of 04/09/2019  Medication Sig  . diphenhydrAMINE (BENADRYL) 25 MG tablet Take 1 tablet (25 mg total) by mouth every 6 (six) hours.  . predniSONE (DELTASONE) 20 MG tablet Take 1 tablet (20 mg total) by mouth daily with breakfast.  . primidone (MYSOLINE) 50 MG tablet Take 1 tablet (50 mg total) by mouth 2 (two) times daily.  Marland Kitchen zolpidem (AMBIEN) 10 MG tablet Take 1 tablet (10 mg total) by mouth at bedtime as needed for sleep.  . [DISCONTINUED] pantoprazole (PROTONIX) 40 MG tablet Take 1 tablet (40 mg total) by mouth 2 (two) times daily. (Patient not taking: Reported on 04/09/2019)   No facility-administered encounter medications on file as of 04/09/2019.     Allergies as of 04/09/2019  . (No Known Allergies)    Past Medical History:  Diagnosis Date  . Bipolar 1 disorder ([place])   . Diabetes ([place])   . Malnutrition ([place])   . Panic attack   . Parkinsonism [facility])     Past Surgical History:  Procedure Laterality Date  . BIOPSY  01/21/2019   Procedure: BIOPSY;  Surgeon: Otis Brace, MD;  Location: [place];  Service: Gastroenterology;;  . COLONOSCOPY WITH PROPOFOL N/A 01/21/2019   Procedure: COLONOSCOPY WITH PROPOFOL;  Surgeon: Otis Brace, MD;  Location: [place];  Service: Gastroenterology;  Laterality: N/A;  . ESOPHAGOGASTRODUODENOSCOPY (EGD) WITH PROPOFOL N/A 01/21/2019   Procedure: ESOPHAGOGASTRODUODENOSCOPY (EGD) WITH PROPOFOL;  Surgeon: Otis Brace, MD;  [facility];  Service: Gastroenterology;  Laterality: N/A;  . MULTIPLE EXTRACTIONS WITH ALVEOLOPLASTY N/A 07/29/2014   Procedure: Extraction of tooth #'s 1,15,16,17,18 with alveoloplasty and gross debridement of teeth;  Surgeon: Lenn Cal, DDS;  Location: [place];  Service: Oral Surgery;  Laterality: N/A;  . NECK SURGERY      Family History  Problem Relation Age of Onset  . Hypertension Maternal Grandmother     Social History   Socioeconomic History  . Marital status: Single    Spouse name: Not on file  . Number of children: Not on file  . Years of education: Not on file  . Highest education level: Not on file  Occupational History  . Not on file  Social Needs  . Financial resource strain: Not on file  . Food insecurity    Worry: Not on file    Inability: Not on file  . Transportation needs    Medical: Not on file    Non-medical: Not on file  Tobacco Use  . Smoking status: Current Some Day Smoker    Types: Cigarettes  . Smokeless tobacco: Never Used  Substance and Sexual Activity  . Alcohol use: Yes    Alcohol/week: 5.0 standard drinks    Types: 5 Standard drinks or  equivalent per week    Comment: daily  . Drug use: No  . Sexual activity: Not Currently  Lifestyle  . Physical activity    Days per week: Not on file    Minutes per session: Not on file  . Stress: Not on file  Relationships  . Social Herbalist on phone: Not on file    Gets together: Not on file    Attends religious service: Not on file    Active member of club or organization: Not on file    Attends meetings of clubs or organizations: Not on file    Relationship status: Not on file  . Intimate partner violence    Fear of current or ex partner: Not on file     Emotionally abused: Not on file    Physically abused: Not on file    Forced sexual activity: Not on file  Other Topics Concern  . Not on file  Social History Narrative  . Not on file    Review of Systems  Constitutional: Positive for fatigue.  Cardiovascular: Negative.   Gastrointestinal: Negative.   Psychiatric/Behavioral: Positive for agitation, behavioral problems and sleep disturbance. Negative for suicidal ideas.    Vitals:   04/09/19 1024  BP: 110/70  Pulse: (!) 106  Temp: (!) 97 F (36.1 C)  SpO2: 98%     Physical Exam  Constitutional:  Unkempt  HENT:  Head: Normocephalic and atraumatic.  Eyes: Pupils are equal, round, and reactive to light. Right eye exhibits no discharge.  Neck: Normal range of motion. Neck supple. No tracheal deviation present. No thyromegaly present.  Cardiovascular: Normal rate and regular rhythm.  Pulmonary/Chest: Effort normal and breath sounds normal. No respiratory distress. He has no wheezes. He has no rales. He exhibits no tenderness.  Neurological: He is alert. No cranial nerve deficit.  Skin: Skin is warm.   Results of the Epworth flowsheet 04/09/2019  Sitting and reading 0  Watching TV 0  Sitting, inactive in a public place (e.g. a theatre or a meeting) 0  As a passenger in a car for an hour without a break 0  Lying down to rest in the afternoon when circumstances permit 0  Sitting and talking to someone 0  Sitting quietly after a lunch without alcohol 0  In a car, while stopped for a few minutes in traffic 0  Total score 0    Data Reviewed: Recent chest x-ray 03/27/2019-no infiltrate CT head 03/11/2019-no significant abnormality  Assessment:  Insomnia secondary to medical condition -Ambien to help him sleep -Obtain a sleep log  No symptoms suggesting obstructive sleep apnea  Optimize management for bipolar disorder -Did recommend a follow-up with his primary doctor  Plan/Recommendations: Ambien 10 mg nightly   Encouraged to keep a sleep log to give [place] an idea of how much sleep is getting  I will see him in about 4 weeks  He is to call with any significant concerns  Sherrilyn Rist MD [facility] 04/09/2019, 10:39 AM  CC: No ref. provider found

## 2019-04-09 NOTE — Patient Instructions (Addendum)
Insomnia  Ambien 10 mg  I will see you back in about 1 month  Call with significant concerns  Obtain a sleep log

## 2019-04-10 ENCOUNTER — Telehealth: Payer: Self-pay | Admitting: Pulmonary Disease

## 2019-04-10 NOTE — Telephone Encounter (Signed)
I called the pharmacy but there was no answer to speak with a pharmacist. I called the pt but he was unavailable and I left a message with a male to have him return my call. [facility] Will await return call.

## 2019-04-11 NOTE — Telephone Encounter (Signed)
LMTCB

## 2019-04-12 NOTE — Telephone Encounter (Signed)
LMTCB

## 2019-04-13 NOTE — Telephone Encounter (Signed)
LMTCB x3 for pt. We have attempted to contact pt several times with no success or call back from pt. Per triage protocol, message will be closed.   

## 2019-05-02 ENCOUNTER — Encounter: Payer: Self-pay | Admitting: Pulmonary Disease

## 2019-05-02 ENCOUNTER — Other Ambulatory Visit: Payer: Self-pay

## 2019-05-02 ENCOUNTER — Ambulatory Visit (INDEPENDENT_AMBULATORY_CARE_PROVIDER_SITE_OTHER): Payer: Medicaid Other | Admitting: Pulmonary Disease

## 2019-05-02 VITALS — BP 114/68 | HR 61 | Temp 97.0°F | Ht 68.0 in | Wt 152.4 lb

## 2019-05-02 DIAGNOSIS — F5105 Insomnia due to other mental disorder: Secondary | ICD-10-CM

## 2019-05-02 MED ORDER — ZOLPIDEM TARTRATE 10 MG PO TABS: 10.0000 mg | ORAL_TABLET | Freq: Every evening | ORAL | 0 refills | Status: DC | PRN

## 2019-05-02 NOTE — Progress Notes (Signed)
Carl Carey    [id number]    04-03-1981  Primary Care Physician:Fulp, Cammie, MD  Referring Physician: Antony Blackbird, MD [place],  [place] [address]  Chief complaint:   Patient with a history of insomnia  HPI: Insomnia for about 1 year Did see him a few weeks ago and prescribed Ambien  He was not able to fill the medications to [facility] We talked about a different pharmacy to send medication prescription to  Denies any other health problems His difficulty remains not been able to sleep  Does have a history of bipolar disorder/mania  Speech pressure, stutter  Denies snoring No significant increase in weight  No dryness of his mouth in the mornings Occasional headaches in the mornings  Just not been able to sleep much Could not get significant information regarding his sleep habits-he keeps maintain and is not getting much sleep at all  Denies being sleepy during the day  Outpatient Encounter Medications as of 05/02/2019  Medication Sig  . predniSONE (DELTASONE) 20 MG tablet Take 1 tablet (20 mg total) by mouth daily with breakfast.  . primidone (MYSOLINE) 50 MG tablet Take 1 tablet (50 mg total) by mouth 2 (two) times daily.  . diphenhydrAMINE (BENADRYL) 25 MG tablet Take 1 tablet (25 mg total) by mouth every 6 (six) hours.  Marland Kitchen zolpidem (AMBIEN) 10 MG tablet Take 1 tablet (10 mg total) by mouth at bedtime as needed for sleep.   No facility-administered encounter medications on file as of 05/02/2019.    Allergies as of 05/02/2019  . (No Known Allergies)    Past Medical History:  Diagnosis Date  . Bipolar 1 disorder ([place])   . Diabetes ([place])   . Malnutrition ([place])   . Panic attack   . Parkinsonism [facility])     Past Surgical History:  Procedure Laterality Date  . BIOPSY  01/21/2019   Procedure: BIOPSY;  Surgeon: Otis Brace, MD;  Location: [place];  Service: Gastroenterology;;  . COLONOSCOPY WITH PROPOFOL N/A  01/21/2019   Procedure: COLONOSCOPY WITH PROPOFOL;  Surgeon: Otis Brace, MD;  Location: [place];  Service: Gastroenterology;  Laterality: N/A;  . ESOPHAGOGASTRODUODENOSCOPY (EGD) WITH PROPOFOL N/A 01/21/2019   Procedure: ESOPHAGOGASTRODUODENOSCOPY (EGD) WITH PROPOFOL;  Surgeon: Otis Brace, MD;  [facility];  Service: Gastroenterology;  Laterality: N/A;  . MULTIPLE EXTRACTIONS WITH ALVEOLOPLASTY N/A 07/29/2014   Procedure: Extraction of tooth #'s 1,15,16,17,18 with alveoloplasty and gross debridement of teeth;  Surgeon: Lenn Cal, DDS;  Location: [place];  Service: Oral Surgery;  Laterality: N/A;  . NECK SURGERY      Family History  Problem Relation Age of Onset  . Hypertension Maternal Grandmother     Social History   Socioeconomic History  . Marital status: Single    Spouse name: Not on file  . Number of children: Not on file  . Years of education: Not on file  . Highest education level: Not on file  Occupational History  . Not on file  Tobacco Use  . Smoking status: Current Some Day Smoker    Packs/day: 0.50    Types: Cigarettes  . Smokeless tobacco: Never Used  Substance and Sexual Activity  . Alcohol use: Yes    Alcohol/week: 5.0 standard drinks    Types: 5 Standard drinks or equivalent per week    Comment: daily  . Drug use: No  . Sexual activity: Not Currently  Other Topics Concern  .  Not on file  Social History Narrative  . Not on file   Social Determinants of Health   Financial Resource Strain:   . Difficulty of Paying Living Expenses: Not on file  Food Insecurity:   . Worried About Charity fundraiser in the Last Year: Not on file  . Ran Out of Food in the Last Year: Not on file  Transportation Needs:   . Lack of Transportation (Medical): Not on file  . Lack of Transportation (Non-Medical): Not on file  Physical Activity:   . Days of Exercise per Week: Not on file  . Minutes of Exercise per Session: Not on file  Stress:     . Feeling of Stress : Not on file  Social Connections:   . Frequency of Communication with Friends and Family: Not on file  . Frequency of Social Gatherings with Friends and Family: Not on file  . Attends Religious Services: Not on file  . Active Member of Clubs or Organizations: Not on file  . Attends Archivist Meetings: Not on file  . Marital Status: Not on file  Intimate Partner Violence:   . Fear of Current or Ex-Partner: Not on file  . Emotionally Abused: Not on file  . Physically Abused: Not on file  . Sexually Abused: Not on file    Review of Systems  Constitutional: Positive for fatigue.  Cardiovascular: Negative.   Gastrointestinal: Negative.   Psychiatric/Behavioral: Positive for agitation, behavioral problems and sleep disturbance. Negative for suicidal ideas.  All other systems reviewed and are negative.   Vitals:   05/02/19 0917  BP: 114/68  Pulse: 61  Temp: (!) 97 F (36.1 C)  SpO2: 98%     Physical Exam  Constitutional:  Unkempt  HENT:  Head: Normocephalic and atraumatic.  Eyes: Pupils are equal, round, and reactive to light. Right eye exhibits no discharge.  Neck: No tracheal deviation present. No thyromegaly present.  Cardiovascular: Normal rate and regular rhythm.  Pulmonary/Chest: Effort normal and breath sounds normal. No respiratory distress. He has no wheezes. He has no rales. He exhibits no tenderness.  Musculoskeletal:     Cervical back: Normal range of motion and neck supple.  Skin: Skin is warm.   Results of the Epworth flowsheet 04/09/2019  Sitting and reading 0  Watching TV 0  Sitting, inactive in a public place (e.g. a theatre or a meeting) 0  As a passenger in a car for an hour without a break 0  Lying down to rest in the afternoon when circumstances permit 0  Sitting and talking to someone 0  Sitting quietly after a lunch without alcohol 0  In a car, while stopped for a few minutes in traffic 0  Total score 0   Data  Reviewed: Recent chest x-ray 03/27/2019-no infiltrate CT head 03/11/2019-no significant abnormality No pending studies  Assessment:  Insomnia secondary to medical condition -Ambien to help him sleep -Did not keep a sleep log as requested  No symptoms suggesting obstructive sleep apnea  Optimize management for bipolar disorder -Encouraged a follow-up with his primary doctor  Plan/Recommendations: Ambien 10 mg nightly -Prescription sent to [facility]  We will follow-up in about 6 weeks  He is to call with any significant concerns  Sherrilyn Rist MD [facility] 05/02/2019, 9:32 AM  CC: Antony Blackbird, MD

## 2019-05-02 NOTE — Patient Instructions (Signed)
Sorry you could not get the medication   We will make sure we send it to the [facility]  Call if you are having any significant concerns or its not working for you  We will see you in about 6 weeks

## 2019-05-11 ENCOUNTER — Telehealth: Payer: Self-pay | Admitting: Licensed Clinical Social Worker

## 2019-05-11 NOTE — Telephone Encounter (Signed)
Call placed to patient regarding [facility] referral. Voicebox was full therefore; LCSW was unable to leave a message requesting a return call.

## 2019-07-14 ENCOUNTER — Encounter (HOSPITAL_COMMUNITY): Payer: Self-pay

## 2019-07-14 ENCOUNTER — Other Ambulatory Visit: Payer: Self-pay

## 2019-07-14 ENCOUNTER — Emergency Department (HOSPITAL_COMMUNITY)
Admission: EM | Admit: 2019-07-14 | Discharge: 2019-07-14 | Disposition: A | Discharge status: Home / Self Care | Payer: Medicaid Other | Attending: Emergency Medicine | Admitting: Emergency Medicine

## 2019-07-14 DIAGNOSIS — R21 Rash and other nonspecific skin eruption: Secondary | ICD-10-CM | POA: Diagnosis present

## 2019-07-14 DIAGNOSIS — Z113 Encounter for screening for infections with a predominantly sexual mode of transmission: Secondary | ICD-10-CM | POA: Diagnosis not present

## 2019-07-14 DIAGNOSIS — Z7984 Long term (current) use of oral hypoglycemic drugs: Secondary | ICD-10-CM | POA: Diagnosis not present

## 2019-07-14 DIAGNOSIS — F17210 Nicotine dependence, cigarettes, uncomplicated: Secondary | ICD-10-CM | POA: Diagnosis not present

## 2019-07-14 DIAGNOSIS — E119 Type 2 diabetes mellitus without complications: Secondary | ICD-10-CM | POA: Diagnosis not present

## 2019-07-14 DIAGNOSIS — R1902 Left upper quadrant abdominal swelling, mass and lump: Secondary | ICD-10-CM | POA: Insufficient documentation

## 2019-07-14 DIAGNOSIS — R590 Localized enlarged lymph nodes: Secondary | ICD-10-CM

## 2019-07-14 DIAGNOSIS — R1903 Right lower quadrant abdominal swelling, mass and lump: Secondary | ICD-10-CM | POA: Insufficient documentation

## 2019-07-14 MED ORDER — HYDROXYZINE HCL 25 MG PO TABS
25.0000 mg | ORAL_TABLET | Freq: Once | ORAL | Status: AC
  Administered 2019-07-14: 25 mg via ORAL
  Filled 2019-07-14: qty 1

## 2019-07-14 MED ORDER — AZITHROMYCIN 250 MG PO TABS
1000.0000 mg | ORAL_TABLET | Freq: Once | ORAL | Status: AC
  Administered 2019-07-14: 1000 mg via ORAL
  Filled 2019-07-14: qty 4

## 2019-07-14 MED ORDER — CEFTRIAXONE SODIUM 1 G IJ SOLR
500.0000 mg | Freq: Once | INTRAMUSCULAR | Status: AC
  Administered 2019-07-14: 500 mg via INTRAMUSCULAR
  Filled 2019-07-14: qty 10

## 2019-07-14 MED ORDER — TRIAMCINOLONE ACETONIDE 0.1 % EX CREA: 1.0000 "application " | TOPICAL_CREAM | Freq: Two times a day (BID) | CUTANEOUS | 0 refills | Status: DC

## 2019-07-14 MED ORDER — STERILE WATER FOR INJECTION IJ SOLN
INTRAMUSCULAR | Status: AC
  Administered 2019-07-14: 1.2 mL
  Filled 2019-07-14: qty 10

## 2019-07-14 NOTE — ED Triage Notes (Signed)
Pt states he has a "scabs that are breaking off" of his left leg and has "white marks" in his groin area. Pt states that he has had the scabs for apprx 2-3 weeks. Pt also states he has developed a rash on his chest, with some mild itching as well.

## 2019-07-14 NOTE — Discharge Instructions (Signed)
Continue taking home medications as prescribed. Use Kenalog cream on the rash on your chest.  Do not apply to face or genitals. Follow-up with your primary care doctor as needed for further evaluation or if rash is not improving. Return to the emergency room with any new, worsening, concerning symptoms.

## 2019-07-14 NOTE — ED Provider Notes (Signed)
[facility] DEPT Provider Note   CSN: [id number] Arrival date & time: 07/14/19  2208     History Chief Complaint  Patient presents with  . Rash    Carl Carey is a 39 y.o. male presenting for evalaution of rash.  Patient states he has had a rash on his legs for over 2 years.  He additionally states he has a rash on his chest which has been there for about a year.  He states they are very itchy.  He states he is here today because he is having new itching of his penis and feels like his groin is swollen.  He denies fevers, chills, nausea, vomiting, urinary symptoms, abnormal bowel movements.  He states he is sexually active with multiple male partners, does not always use condoms.  He denies recent change in medications.  He states he is taking his haloperidol and Bentyl as prescribed.  Additional history taken chart review.  Patient with a history of bipolar, diabetes, parkinsonism features after psych meds, abnormal LFTs.   HPI     Past Medical History:  Diagnosis Date  . Bipolar 1 disorder ([place])   . Diabetes ([place])   . Malnutrition ([place])   . Panic attack   . Parkinsonism [facility])     Patient Active Problem List   Diagnosis Date Noted  . Abnormal LFTs 03/14/2019  . Encephalopathy acute 03/13/2019  . NMS (neuroleptic malignant syndrome) 03/12/2019  . Rhabdomyolysis 03/07/2019  . Nausea with vomiting 03/06/2019  . Extrapyramidal symptom 03/03/2019  . Bipolar 1 disorder ([place]) 11/09/2018  . Parkinsonian features 07/21/2014  . Dysphagia 07/21/2014    Past Surgical History:  Procedure Laterality Date  . BIOPSY  01/21/2019   Procedure: BIOPSY;  Surgeon: Otis Brace, MD;  Location: [place];  Service: Gastroenterology;;  . COLONOSCOPY WITH PROPOFOL N/A 01/21/2019   Procedure: COLONOSCOPY WITH PROPOFOL;  Surgeon: Otis Brace, MD;  Location: [place];  Service: Gastroenterology;  Laterality: N/A;  . ESOPHAGOGASTRODUODENOSCOPY  (EGD) WITH PROPOFOL N/A 01/21/2019   Procedure: ESOPHAGOGASTRODUODENOSCOPY (EGD) WITH PROPOFOL;  Surgeon: Otis Brace, MD;  [facility];  Service: Gastroenterology;  Laterality: N/A;  . MULTIPLE EXTRACTIONS WITH ALVEOLOPLASTY N/A 07/29/2014   Procedure: Extraction of tooth #'s 1,15,16,17,18 with alveoloplasty and gross debridement of teeth;  Surgeon: Lenn Cal, DDS;  Location: [place];  Service: Oral Surgery;  Laterality: N/A;  . NECK SURGERY         Family History  Problem Relation Age of Onset  . Hypertension Maternal Grandmother     Social History   Tobacco Use  . Smoking status: Current Some Day Smoker    Packs/day: 0.50    Types: Cigarettes  . Smokeless tobacco: Never Used  Substance Use Topics  . Alcohol use: Yes    Alcohol/week: 5.0 standard drinks    Types: 5 Standard drinks or equivalent per week    Comment: daily  . Drug use: No    Home Medications Prior to Admission medications   Medication Sig Start Date End Date Taking? Authorizing Provider  diphenhydrAMINE (BENADRYL) 25 MG tablet Take 1 tablet (25 mg total) by mouth every 6 (six) hours. 03/19/19 04/18/19  Harold Hedge, MD  predniSONE (DELTASONE) 20 MG tablet Take 1 tablet (20 mg total) by mouth daily with breakfast. 03/19/19   Harold Hedge, MD  primidone (MYSOLINE) 50 MG tablet Take 1 tablet (50 mg total) by mouth 2 (two) times daily. 03/19/19   Harold Hedge, MD  triamcinolone cream (KENALOG) 0.1 % Apply 1 application topically 2 (two) times daily. 07/14/19   Misao Fackrell, PA-C  zolpidem (AMBIEN) 10 MG tablet Take 1 tablet (10 mg total) by mouth at bedtime as needed for sleep. 05/02/19 06/01/19  Laurin Coder, MD    Allergies    Patient has no known allergies.  Review of Systems   Review of Systems  Genitourinary:       Penile itching and swelling  Skin: Positive for rash.    Physical Exam Updated Vital Signs BP 128/74   Pulse 69   Temp 98.6 F (37 C) (Oral)    Resp 16   Ht 5' 8"  (1.727 m)   Wt 65.8 kg   SpO2 95%   BMI 22.05 kg/m   Physical Exam Vitals and nursing note reviewed. Exam conducted with a chaperone present.  Constitutional:      General: He is not in acute distress.    Appearance: He is well-developed.     Comments: Appears disheveled  HENT:     Head: Normocephalic and atraumatic.  Eyes:     Conjunctiva/sclera: Conjunctivae normal.     Pupils: Pupils are equal, round, and reactive to light.  Cardiovascular:     Rate and Rhythm: Normal rate and regular rhythm.     Pulses: Normal pulses.  Pulmonary:     Effort: Pulmonary effort is normal. No respiratory distress.     Breath sounds: Normal breath sounds. No wheezing.  Abdominal:     General: There is no distension.     Palpations: Abdomen is soft. There is no mass.     Tenderness: There is no abdominal tenderness. There is no guarding or rebound.  Genitourinary:    Comments: No obvious rash or swelling.  No testicular or penile pain or swelling.  No obvious penile discharge.  Inguinal lymphadenopathy bilaterally Musculoskeletal:        General: Normal range of motion.     Cervical back: Normal range of motion and neck supple.  Lymphadenopathy:     Lower Body: Right inguinal adenopathy present. Left inguinal adenopathy present.  Skin:    General: Skin is warm and dry.     Capillary Refill: Capillary refill takes less than 2 seconds.     Findings: Rash present.     Comments: Scaly plaque-like rash of the anterior chest wall.  See picture below. Scabs noted intermittently of lower extremities, no erythema, induration, or signs of infection. C/w bites  Neurological:     Mental Status: He is alert and oriented to person, place, and time.  Psychiatric:        Speech: Speech is rapid and pressured.        ED Results / Procedures / Treatments   Labs (all labs ordered are listed, but only abnormal results are displayed) Labs Reviewed  GC/CHLAMYDIA PROBE AMP ([facility]) NOT AT [facility]    EKG None  Radiology No results found.  Procedures Procedures (including critical care time)  Medications Ordered in ED Medications  cefTRIAXone (ROCEPHIN) injection 500 mg (500 mg Intramuscular Given 07/14/19 2301)  azithromycin (ZITHROMAX) tablet 1,000 mg (1,000 mg Oral Given 07/14/19 2301)  hydrOXYzine (ATARAX/VISTARIL) tablet 25 mg (25 mg Oral Given 07/14/19 2301)  sterile water (preservative free) injection (1.2 mLs  Given 07/14/19 2301)    ED Course  I have reviewed the triage vital signs and the nursing notes.  Pertinent labs & imaging results that were available during my care of the patient  were reviewed by me and considered in my medical decision making (see chart for details).    MDM Rules/Calculators/A&P                      Patient presenting for evaluation of rash.  On exam, patient appears nontoxic.  He does have a plaque-like scaly rash of his anterior chest.  Multiple scabs of lower extremities without signs of infection.  Consistent with bites.  Patient also reporting penile/testicular symptoms.  On exam, he has inguinal lymphadenopathy, reports multiple partners.  As such, will test and treat for gonorrhea and chlamydia.  He is at high risk for having an STD.  Patient's speech is pressured, but at this time he does not appear to be a danger to himself or others.  I do not believe he needs emergent psychiatric hospitalization.  Discussed findings and plan with patient, who is agreeable.  At this time, patient appears safe for discharge.  Return precautions given.  Patient states he understands and agrees to plan.  Final Clinical Impression(s) / ED Diagnoses Final diagnoses:  Inguinal lymphadenopathy  Rash and nonspecific skin eruption    Rx / DC Orders ED Discharge Orders         Ordered    triamcinolone cream (KENALOG) 0.1 %  2 times daily     07/14/19 2244           Franchot Heidelberg, PA-C 07/14/19 2328    Wyvonnia Dusky,  MD 07/15/19 1222

## 2019-07-17 LAB — GC/CHLAMYDIA PROBE AMP (~~LOC~~) NOT AT ARMC
Chlamydia: NEGATIVE
Neisseria Gonorrhea: NEGATIVE

## 2020-12-04 ENCOUNTER — Other Ambulatory Visit: Payer: Self-pay

## 2020-12-04 ENCOUNTER — Emergency Department
Admission: EM | Admit: 2020-12-04 | Discharge: 2020-12-04 | Disposition: A | Discharge status: Home / Self Care | Payer: Medicaid Other | Attending: Emergency Medicine | Admitting: Emergency Medicine

## 2020-12-04 DIAGNOSIS — L989 Disorder of the skin and subcutaneous tissue, unspecified: Secondary | ICD-10-CM | POA: Insufficient documentation

## 2020-12-04 DIAGNOSIS — E119 Type 2 diabetes mellitus without complications: Secondary | ICD-10-CM | POA: Insufficient documentation

## 2020-12-04 DIAGNOSIS — G20 Parkinson's disease: Secondary | ICD-10-CM | POA: Diagnosis not present

## 2020-12-04 DIAGNOSIS — F17210 Nicotine dependence, cigarettes, uncomplicated: Secondary | ICD-10-CM | POA: Diagnosis not present

## 2020-12-04 DIAGNOSIS — R238 Other skin changes: Secondary | ICD-10-CM

## 2020-12-04 DIAGNOSIS — Z0000 Encounter for general adult medical examination without abnormal findings: Secondary | ICD-10-CM

## 2020-12-04 MED ORDER — CEPHALEXIN 500 MG PO CAPS: 500.0000 mg | ORAL_CAPSULE | Freq: Three times a day (TID) | ORAL | 0 refills | Status: AC

## 2020-12-04 MED ORDER — MUPIROCIN 2 % EX OINT: 1.0000 "application " | TOPICAL_OINTMENT | Freq: Three times a day (TID) | CUTANEOUS | 1 refills | Status: AC

## 2020-12-04 NOTE — ED Triage Notes (Signed)
Pt [place] into the ED via [facility] [place] c/o ant bites and right groin pain.  Pt currently homeless.  Vitals stable and patient ambulatory with EMS.

## 2020-12-04 NOTE — ED Provider Notes (Signed)
[facility] Emergency Department Provider Note  ___________________________________________   Event Date/Time   First MD Initiated Contact with Patient 12/04/20 1050     (approximate)  I have reviewed the triage vital signs and the nursing notes.   HISTORY  Chief Complaint Insect Bite  HPI Carl Carey is a 40 y.o. male presents to the ED via [facility] [place].  The patient is on home, and presents with some scattered complaints about and skin irritation.  He has no other complaints at this time.    Past Medical History:  Diagnosis Date   Bipolar 1 disorder ([place])    Diabetes ([place])    Malnutrition ([place])    Panic attack    Parkinsonism [facility])     Patient Active Problem List   Diagnosis Date Noted   Abnormal LFTs 03/14/2019   Encephalopathy acute 03/13/2019   NMS (neuroleptic malignant syndrome) 03/12/2019   Rhabdomyolysis 03/07/2019   Nausea with vomiting 03/06/2019   Extrapyramidal symptom 03/03/2019   Bipolar 1 disorder ([place]) 11/09/2018   Parkinsonian features 07/21/2014   Dysphagia 07/21/2014    Past Surgical History:  Procedure Laterality Date   BIOPSY  01/21/2019   Procedure: BIOPSY;  Surgeon: Otis Brace, MD;  Location: [place];  Service: Gastroenterology;;   COLONOSCOPY WITH PROPOFOL N/A 01/21/2019   Procedure: COLONOSCOPY WITH PROPOFOL;  Surgeon: Otis Brace, MD;  Location: [place];  Service: Gastroenterology;  Laterality: N/A;   ESOPHAGOGASTRODUODENOSCOPY (EGD) WITH PROPOFOL N/A 01/21/2019   Procedure: ESOPHAGOGASTRODUODENOSCOPY (EGD) WITH PROPOFOL;  Surgeon: Otis Brace, MD;  Location: [place];  Service: Gastroenterology;  Laterality: N/A;   MULTIPLE EXTRACTIONS WITH ALVEOLOPLASTY N/A 07/29/2014   Procedure: Extraction of tooth #'s 1,15,16,17,18 with alveoloplasty and gross debridement of teeth;  Surgeon: Lenn Cal, DDS;  Location: [place];  Service: Oral Surgery;  Laterality:  N/A;   NECK SURGERY      Prior to Admission medications   Medication Sig Start Date End Date Taking? Authorizing Provider  cephALEXin (KEFLEX) 500 MG capsule Take 1 capsule (500 mg total) by mouth 3 (three) times daily for 7 days. 12/04/20 12/11/20 Yes Idrissa [place], Dannielle Karvonen, PA-C  mupirocin ointment (BACTROBAN) 2 % Apply 1 application topically 3 (three) times daily for 10 days. 12/04/20 12/14/20 Yes Edina Winningham, Dannielle Karvonen, PA-C    Allergies Patient has no known allergies.  Family History  Problem Relation Age of Onset   Hypertension Maternal Grandmother     Social History Social History   Tobacco Use   Smoking status: Some Days    Packs/day: 0.50    Types: Cigarettes   Smokeless tobacco: Never  Vaping Use   Vaping Use: Never used  Substance Use Topics   Alcohol use: Yes    Alcohol/week: 5.0 standard drinks    Types: 5 Standard drinks or equivalent per week    Comment: daily   Drug use: No    Review of Systems  Constitutional: No fever/chills Eyes: No visual changes. ENT: No sore throat. Cardiovascular: Denies chest pain. Respiratory: Denies shortness of breath. Gastrointestinal: No abdominal pain.  No nausea, no vomiting.  No diarrhea.  No constipation. Genitourinary: Negative for dysuria. Musculoskeletal: Negative for back pain. Skin: Negative for rash. Reports irritation Neurological: Negative for headaches, focal weakness or numbness. ____________________________________________   PHYSICAL EXAM:  VITAL SIGNS: ED Triage Vitals  Enc Vitals Group     BP 12/04/20 1034 122/75     Pulse Rate 12/04/20 1034 91  Resp 12/04/20 1034 18     Temp 12/04/20 1034 98.4 F (36.9 C)     Temp Source 12/04/20 1034 Oral     SpO2 12/04/20 1034 97 %     Weight 12/04/20 1035 142 lb (64.4 kg)     Height 12/04/20 1035 5' 8.5" (1.74 m)     Head Circumference --      Peak Flow --      Pain Score 12/04/20 1035 8     Pain Loc --      Pain Edu? --      Excl. in [place]? --      Constitutional: Alert and oriented. Well appearing and in no acute distress. Eyes: Conjunctivae are normal. PERRL. EOMI. Head: Atraumatic. Cardiovascular: Normal rate, regular rhythm. Grossly normal heart sounds.  Good peripheral circulation. Respiratory: Normal respiratory effort.  No retractions. Lungs CTAB. Musculoskeletal: No lower extremity tenderness nor edema.  No joint effusions. Neurologic:  Normal speech and language. No gross focal neurologic deficits are appreciated. No gait instability. Skin:  Skin is warm, dry and intact. No rash noted. Psychiatric: Mood and affect are normal. Speech and behavior are normal.  ____________________________________________   LABS (all labs ordered are listed, but only abnormal results are displayed)  Labs Reviewed - No data to display ____________________________________________  EKG   ____________________________________________  RADIOLOGY I, Melvenia Needles, personally viewed and evaluated these images (plain radiographs) as part of my medical decision making, as well as reviewing the written report by the radiologist.  ED MD interpretation:    Official radiology report(s): No results found.  ____________________________________________   PROCEDURES  Procedure(s) performed (including Critical Care):  Procedures   ____________________________________________   INITIAL IMPRESSION / ASSESSMENT AND PLAN / ED COURSE  As part of my medical decision making, I reviewed the following data within the electronic MEDICAL RECORD NUMBER Notes from prior ED visits   Patient who is on home, presents to the ED with complaints of skin irritation.  He was provided with a sandwich tray and beverages while in the ED.  He was allowed to rest undisturbed for several hours.  He was ultimately discharged with prescriptions for Keflex and mupirocin on as he requested.  A sample bottle of chlorhexidine was also provided with benefit.  He is  discharged from the ED without incident. ____________________________________________   FINAL CLINICAL IMPRESSION(S) / ED DIAGNOSES  Final diagnoses:  Well adult health check  Skin irritation     ED Discharge Orders          Ordered    mupirocin ointment (BACTROBAN) 2 %  3 times daily        12/04/20 1446    cephALEXin (KEFLEX) 500 MG capsule  3 times daily        12/04/20 1446             Note:  This document was prepared using Dragon voice recognition software and may include unintentional dictation errors.    Melvenia Needles, PA-C 12/04/20 1707    Naaman Plummer, MD 12/05/20 1524

## 2020-12-04 NOTE — ED Notes (Signed)
See triage note  Presents with several insect bites to abd and groin

## 2021-01-11 ENCOUNTER — Emergency Department: Payer: Medicaid Other

## 2021-01-11 ENCOUNTER — Emergency Department
Admission: EM | Admit: 2021-01-11 | Discharge: 2021-01-13 | Disposition: A | Discharge status: Other Acute Inpatient Hospital | Payer: Medicaid Other | Attending: Emergency Medicine | Admitting: Emergency Medicine

## 2021-01-11 ENCOUNTER — Other Ambulatory Visit: Payer: Self-pay

## 2021-01-11 ENCOUNTER — Encounter: Payer: Self-pay | Admitting: Emergency Medicine

## 2021-01-11 DIAGNOSIS — L089 Local infection of the skin and subcutaneous tissue, unspecified: Secondary | ICD-10-CM | POA: Diagnosis not present

## 2021-01-11 DIAGNOSIS — Z5900 Homelessness unspecified: Secondary | ICD-10-CM | POA: Insufficient documentation

## 2021-01-11 DIAGNOSIS — R509 Fever, unspecified: Secondary | ICD-10-CM | POA: Insufficient documentation

## 2021-01-11 DIAGNOSIS — G20 Parkinson's disease: Secondary | ICD-10-CM | POA: Insufficient documentation

## 2021-01-11 DIAGNOSIS — Z79899 Other long term (current) drug therapy: Secondary | ICD-10-CM | POA: Diagnosis not present

## 2021-01-11 DIAGNOSIS — E119 Type 2 diabetes mellitus without complications: Secondary | ICD-10-CM | POA: Diagnosis not present

## 2021-01-11 DIAGNOSIS — F29 Unspecified psychosis not due to a substance or known physiological condition: Secondary | ICD-10-CM | POA: Insufficient documentation

## 2021-01-11 DIAGNOSIS — F309 Manic episode, unspecified: Secondary | ICD-10-CM | POA: Diagnosis not present

## 2021-01-11 DIAGNOSIS — Z20822 Contact with and (suspected) exposure to covid-19: Secondary | ICD-10-CM | POA: Insufficient documentation

## 2021-01-11 DIAGNOSIS — M7989 Other specified soft tissue disorders: Secondary | ICD-10-CM | POA: Insufficient documentation

## 2021-01-11 DIAGNOSIS — F17210 Nicotine dependence, cigarettes, uncomplicated: Secondary | ICD-10-CM | POA: Diagnosis not present

## 2021-01-11 DIAGNOSIS — N4889 Other specified disorders of penis: Secondary | ICD-10-CM | POA: Insufficient documentation

## 2021-01-11 DIAGNOSIS — F319 Bipolar disorder, unspecified: Secondary | ICD-10-CM | POA: Diagnosis present

## 2021-01-11 LAB — URINE DRUG SCREEN, QUALITATIVE (ARMC ONLY)
Amphetamines, Ur Screen: NOT DETECTED
Barbiturates, Ur Screen: NOT DETECTED
Benzodiazepine, Ur Scrn: NOT DETECTED
Cannabinoid 50 Ng, Ur ~~LOC~~: NOT DETECTED
Cocaine Metabolite,Ur ~~LOC~~: NOT DETECTED
MDMA (Ecstasy)Ur Screen: NOT DETECTED
Methadone Scn, Ur: NOT DETECTED
Opiate, Ur Screen: NOT DETECTED
Phencyclidine (PCP) Ur S: NOT DETECTED
Tricyclic, Ur Screen: NOT DETECTED

## 2021-01-11 LAB — CBC WITH DIFFERENTIAL/PLATELET
Abs Immature Granulocytes: 0.01 10*3/uL (ref 0.00–0.07)
Basophils Absolute: 0.1 10*3/uL (ref 0.0–0.1)
Basophils Relative: 1 %
Eosinophils Absolute: 0.2 10*3/uL (ref 0.0–0.5)
Eosinophils Relative: 3 %
HCT: 37.2 % — ABNORMAL LOW (ref 39.0–52.0)
Hemoglobin: 12.4 g/dL — ABNORMAL LOW (ref 13.0–17.0)
Immature Granulocytes: 0 %
Lymphocytes Relative: 15 %
Lymphs Abs: 0.9 10*3/uL (ref 0.7–4.0)
MCH: 27.1 pg (ref 26.0–34.0)
MCHC: 33.3 g/dL (ref 30.0–36.0)
MCV: 81.4 fL (ref 80.0–100.0)
Monocytes Absolute: 0.6 10*3/uL (ref 0.1–1.0)
Monocytes Relative: 9 %
Neutro Abs: 4.3 10*3/uL (ref 1.7–7.7)
Neutrophils Relative %: 72 %
Platelets: 291 10*3/uL (ref 150–400)
RBC: 4.57 MIL/uL (ref 4.22–5.81)
RDW: 13.7 % (ref 11.5–15.5)
WBC: 6 10*3/uL (ref 4.0–10.5)
nRBC: 0 % (ref 0.0–0.2)

## 2021-01-11 LAB — RESP PANEL BY RT-PCR (FLU A&B, COVID) ARPGX2
Influenza A by PCR: NEGATIVE
Influenza B by PCR: NEGATIVE
SARS Coronavirus 2 by RT PCR: NEGATIVE

## 2021-01-11 LAB — COMPREHENSIVE METABOLIC PANEL
ALT: 14 U/L (ref 0–44)
AST: 24 U/L (ref 15–41)
Albumin: 3.6 g/dL (ref 3.5–5.0)
Alkaline Phosphatase: 58 U/L (ref 38–126)
Anion gap: 8 (ref 5–15)
BUN: 6 mg/dL (ref 6–20)
CO2: 25 mmol/L (ref 22–32)
Calcium: 9.3 mg/dL (ref 8.9–10.3)
Chloride: 104 mmol/L (ref 98–111)
Creatinine, Ser: 0.78 mg/dL (ref 0.61–1.24)
GFR, Estimated: >60 mL/min (ref >60)
Glucose, Bld: 97 mg/dL (ref 70–99)
Potassium: 3.1 mmol/L — ABNORMAL LOW (ref 3.5–5.1)
Sodium: 137 mmol/L (ref 135–145)
Total Bilirubin: 0.8 mg/dL (ref 0.3–1.2)
Total Protein: 6.8 g/dL (ref 6.5–8.1)

## 2021-01-11 LAB — LACTIC ACID, PLASMA
Lactic Acid, Venous: 1.5 mmol/L (ref 0.5–1.9)
Lactic Acid, Venous: 1.5 mmol/L (ref 0.5–1.9)

## 2021-01-11 LAB — URINALYSIS, COMPLETE (UACMP) WITH MICROSCOPIC
Bacteria, UA: NONE SEEN
Bilirubin Urine: NEGATIVE
Glucose, UA: NEGATIVE mg/dL
Hgb urine dipstick: NEGATIVE
Ketones, ur: NEGATIVE mg/dL
Leukocytes,Ua: NEGATIVE
Nitrite: NEGATIVE
Protein, ur: NEGATIVE mg/dL
Specific Gravity, Urine: <1.005 — ABNORMAL LOW (ref 1.005–1.030)
Squamous Epithelial / HPF: NONE SEEN (ref 0–5)
pH: 6 (ref 5.0–8.0)

## 2021-01-11 LAB — TSH: TSH: 2.022 u[IU]/mL (ref 0.350–4.500)

## 2021-01-11 MED ORDER — SODIUM CHLORIDE 0.9 % IV BOLUS
1000.0000 mL | Freq: Once | INTRAVENOUS | Status: AC
  Administered 2021-01-11: 1000 mL via INTRAVENOUS

## 2021-01-11 NOTE — ED Notes (Signed)
Care transferred, report received from [place], [place]

## 2021-01-11 NOTE — ED Provider Notes (Signed)
[facility] Emergency Department Provider Note   ____________________________________________   Event Date/Time   First MD Initiated Contact with Patient 01/11/21 1841     (approximate)  I have reviewed the triage vital signs and the nursing notes.   HISTORY  Chief Complaint No chief complaint on file.  Fever  HPI Carl Carey is a 40 y.o. male patient complains of fever and swelling in his penis and under his left arm.  Patient is homeless and reportedly drinks alcohol every day.  He speaks very fast.  He is sweaty.  He shows me some less than 1 mm areas on his shaft of his penis which she said were pussy and oozing but are not doing anything right now and look more like scars anything else.  He does have a small 1 mm pustule on the underside of his scrotum.  He is very tachycardic and has been mentioning sweaty.  Not quite sure at this point what else could be going on with him he is not complaining of anything else. ----------------------------------------- 7:05 PM on 01/11/2021 ----------------------------------------- Patient now complaining of swelling in his fingers and pimples on his wrists.  He is jumping from 1 subject to the other.  Saying he was going to walk to [facility] and try to catch the bus etc. he sounds manic.  He is speaking very quickly.  He is no longer tachycardic.  He does not have a temperature anymore.        Past Medical History:  Diagnosis Date   Bipolar 1 disorder ([place])    Diabetes ([place])    Malnutrition ([place])    Panic attack    Parkinsonism [facility])     Patient Active Problem List   Diagnosis Date Noted   Abnormal LFTs 03/14/2019   Encephalopathy acute 03/13/2019   NMS (neuroleptic malignant syndrome) 03/12/2019   Rhabdomyolysis 03/07/2019   Nausea with vomiting 03/06/2019   Extrapyramidal symptom 03/03/2019   Bipolar 1 disorder ([place]) 11/09/2018   Parkinsonian features 07/21/2014   Dysphagia 07/21/2014    Past  Surgical History:  Procedure Laterality Date   BIOPSY  01/21/2019   Procedure: BIOPSY;  Surgeon: Otis Brace, MD;  Location: [place];  Service: Gastroenterology;;   COLONOSCOPY WITH PROPOFOL N/A 01/21/2019   Procedure: COLONOSCOPY WITH PROPOFOL;  Surgeon: Otis Brace, MD;  Location: [place];  Service: Gastroenterology;  Laterality: N/A;   ESOPHAGOGASTRODUODENOSCOPY (EGD) WITH PROPOFOL N/A 01/21/2019   Procedure: ESOPHAGOGASTRODUODENOSCOPY (EGD) WITH PROPOFOL;  Surgeon: Otis Brace, MD;  Location: [place];  Service: Gastroenterology;  Laterality: N/A;   MULTIPLE EXTRACTIONS WITH ALVEOLOPLASTY N/A 07/29/2014   Procedure: Extraction of tooth #'s 1,15,16,17,18 with alveoloplasty and gross debridement of teeth;  Surgeon: Lenn Cal, DDS;  Location: [place];  Service: Oral Surgery;  Laterality: N/A;   NECK SURGERY      Prior to Admission medications   Not on File    Allergies Patient has no known allergies.  Family History  Problem Relation Age of Onset   Hypertension Maternal Grandmother     Social History Social History   Tobacco Use   Smoking status: Some Days    Packs/day: 0.50    Types: Cigarettes   Smokeless tobacco: Never  Vaping Use   Vaping Use: Never used  Substance Use Topics   Alcohol use: Yes    Alcohol/week: 5.0 standard drinks    Types: 5 Standard drinks or equivalent per week    Comment: daily   Drug use: No  Review of Systems As near as I can tell: Constitutional: No fever/chills Eyes: No visual changes. ENT: No sore throat. Cardiovascular: Denies chest pain. Respiratory: Denies shortness of breath. Gastrointestinal: No abdominal pain.  No nausea, no vomiting.  No diarrhea.  No constipation. Genitourinary: Negative for dysuria. Musculoskeletal: Negative for back pain. Skin: Negative for rash. Neurological: Negative for headaches, focal weakness  ____________________________________________   PHYSICAL  EXAM:  VITAL SIGNS: ED Triage Vitals  Enc Vitals Group     BP 01/11/21 1825 113/76     Pulse Rate 01/11/21 1825 (!) 147     Resp 01/11/21 1825 20     Temp 01/11/21 1825 (!) 100.7 F (38.2 C)     Temp Source 01/11/21 1825 Oral     SpO2 01/11/21 1825 97 %     Weight 01/11/21 1826 143 lb (64.9 kg)     Height 01/11/21 1826 5' 5"  (1.651 m)     Head Circumference --      Peak Flow --      Pain Score 01/11/21 1825 9     Pain Loc --      Pain Edu? --      Excl. in [place]? --     Constitutional: Alert and oriented.  As mentioned in HPI speaking very fast moving very quickly of loose associations and tangential speech. Eyes: Conjunctivae are normal. PER EOMI. Head: Atraumatic. Nose: No congestion/rhinnorhea. Mouth/Throat: Mucous membranes are moist.  Oropharynx non-erythematous. Neck: No stridor.  Cardiovascular: Initially tacky now normal rate, regular rhythm. Grossly normal heart sounds.  Good peripheral circulation. Respiratory: Normal respiratory effort.  No retractions. Lungs CTAB. Gastrointestinal: Soft and nontender. No distention. No abdominal bruits. No CVA tenderness. Musculoskeletal: No lower extremity tenderness nor edema.   Neurologic: See HPI for description of speech and language no gross focal neurologic deficits are appreciated. No gait instability. Skin:  Skin is warm, dry and intact. No rash noted.  He does have some swollen areas and scars under the left axilla which are not red warm and do not appear appear to be tender.  They are also not firm.  They are easily compressible.  Looks like there was some here adenitis suppurativa there there was I&D a couple times.   ____________________________________________   LABS (all labs ordered are listed, but only abnormal results are displayed)  Labs Reviewed  COMPREHENSIVE METABOLIC PANEL - Abnormal; Notable for the following components:      Result Value   Potassium 3.1 (*)    All other components within normal limits   CBC WITH DIFFERENTIAL/PLATELET - Abnormal; Notable for the following components:   Hemoglobin 12.4 (*)    HCT 37.2 (*)    All other components within normal limits  URINALYSIS, COMPLETE (UACMP) WITH MICROSCOPIC - Abnormal; Notable for the following components:   APPearance CLEAR (*)    Specific Gravity, Urine <1.005 (*)    All other components within normal limits  RESP PANEL BY RT-PCR (FLU A&B, COVID) ARPGX2  URINE CULTURE  CULTURE, BLOOD (ROUTINE X 2)  CULTURE, BLOOD (ROUTINE X 2)  LACTIC ACID, PLASMA  LACTIC ACID, PLASMA  TSH  URINE DRUG SCREEN, QUALITATIVE ([facility] ONLY)   ____________________________________________  EKG  EKG read interpreted by me shows sinus tachycardia rate of 116 normal axis no acute ST-T wave changes  At 7:00 monitor shows a heart rate of 70.  This is normal sinus rhythm. ____________________________________________  RADIOLOGY Gertha Calkin, personally viewed and evaluated these images (plain radiographs)  as part of my medical decision making, as well as reviewing the written report by the radiologist.  ED MD interpretation:     Official radiology report(s): DG Chest 1 View  Result Date: 01/11/2021 CLINICAL DATA:  Fever elevated heart rate EXAM: CHEST  1 VIEW COMPARISON:  03/27/2019 FINDINGS: The heart size and mediastinal contours are within normal limits. Both lungs are clear. The visualized skeletal structures are unremarkable. IMPRESSION: No active disease. Electronically Signed   By: Donavan Foil M.D.   On: 01/11/2021 19:12    ____________________________________________   PROCEDURES  Procedure(s) performed (including Critical Care):  Procedures   ____________________________________________   INITIAL IMPRESSION / ASSESSMENT AND PLAN / ED COURSE  ----------------------------------------- 11:02 PM on 01/11/2021 ----------------------------------------- I am waiting for psych to come in and evaluate this patient.  He still is  exhibiting symptoms of mania.  He is not anxious to leave.  I have not committed him.  We will observe him closely and see how he does.  His tox screen is negative his electrolytes and white count are within normal limits.  He is not running a fever.  His lactic acid and TSH are normal.  I think we are safe watching him for now.             ____________________________________________   FINAL CLINICAL IMPRESSION(S) / ED DIAGNOSES  Final diagnoses:  [facility])     ED Discharge Orders     None        Note:  This document was prepared using Dragon voice recognition software and may include unintentional dictation errors.    Nena Polio, MD 01/11/21 2303

## 2021-01-11 NOTE — ED Triage Notes (Addendum)
Pt to ED POV c/o fever and swelling Pt had staph infection 6 weeks ago, pt used OTC creams to put on lesions. Pt c/o of blisters on scrotum and buttocks.   Pt is rapidly speaking and hard to understand Pt is homeless and states he drinks alcohol daily,  pt is diaphoretic at this time

## 2021-01-11 NOTE — ED Notes (Signed)
X-ray at bedside

## 2021-01-12 DIAGNOSIS — F319 Bipolar disorder, unspecified: Secondary | ICD-10-CM

## 2021-01-12 DIAGNOSIS — L089 Local infection of the skin and subcutaneous tissue, unspecified: Secondary | ICD-10-CM

## 2021-01-12 MED ORDER — DIVALPROEX SODIUM 500 MG PO DR TAB
500.0000 mg | DELAYED_RELEASE_TABLET | Freq: Two times a day (BID) | ORAL | Status: DC
  Administered 2021-01-12 – 2021-01-13 (×2): 500 mg via ORAL
  Filled 2021-01-12 (×2): qty 1

## 2021-01-12 NOTE — ED Notes (Signed)
Dr. Weber Cooks and psychiatric team at bedside.

## 2021-01-12 NOTE — ED Notes (Signed)
Pt given shower supplies at this time.

## 2021-01-12 NOTE — ED Notes (Signed)
Report off to mimi rn

## 2021-01-12 NOTE — ED Notes (Signed)
[facility]  PENDING  PLACEMENT

## 2021-01-12 NOTE — Consult Note (Signed)
[facility] Face-to-Face Psychiatry Consult   Reason for Consult: Consult for 40 year old man with a history of mental health problems brought into the hospital and found to be manic and disorganized Referring Physician: Jari Pigg Patient Identification: Carl Carey MRN:  [id number] Principal Diagnosis: Bipolar 1 disorder ([place]) Diagnosis:  Principal Problem:   Bipolar 1 disorder ([place]) Active Problems:   Skin infection   Total Time spent with patient: 1 hour  Subjective:   Carl Carey is a 40 y.o. male patient admitted with "it is a blood clot in a lot of things".  HPI: Patient seen chart reviewed.  40 year old man with a history of psychotic disorder came to the emergency room voluntarily initially with complaints of skin bumps and discomfort which she is referring to as "blood clots".  Patient was immediately observed to be disorganized in his speech and behavior.  On interview the patient was cooperative but grossly disorganized.  Not able to give much in the way of lucid history.  Talks very rapidly and mentions auditory hallucinations.  Denies that he is abusing drugs regularly.  Does say that he is smoking cigarettes.  Patient is not currently on any psychiatric medicine.  He says that he has been living out in the street for about a year and a half.  Notably when I looked back at his old chart that seems to be always the time that he mentions when he has been seen before.  Talks about hallucinations.  Denies suicidal or homicidal ideation.  Past Psychiatric History: Patient has a past history of recurrent episodes of psychosis.  Last hospitalized in 2020.  Haldol seems to have been frequently used for some reason despite diagnoses of parkinsonism and possibly tardive dyskinesia in the past.  Unclear if he has been on any other medicine.  No known suicide attempts.  Risk to Self:   Risk to Others:   Prior Inpatient Therapy:   Prior Outpatient Therapy:    Past Medical History:  Past Medical  History:  Diagnosis Date   Bipolar 1 disorder ([place])    Diabetes ([place])    Malnutrition ([place])    Panic attack    Parkinsonism ([place])     Past Surgical History:  Procedure Laterality Date   BIOPSY  01/21/2019   Procedure: BIOPSY;  Surgeon: Otis Brace, MD;  Location: [place];  Service: Gastroenterology;;   COLONOSCOPY WITH PROPOFOL N/A 01/21/2019   Procedure: COLONOSCOPY WITH PROPOFOL;  Surgeon: Otis Brace, MD;  Location: [place];  Service: Gastroenterology;  Laterality: N/A;   ESOPHAGOGASTRODUODENOSCOPY (EGD) WITH PROPOFOL N/A 01/21/2019   Procedure: ESOPHAGOGASTRODUODENOSCOPY (EGD) WITH PROPOFOL;  Surgeon: Otis Brace, MD;  Location: [place];  Service: Gastroenterology;  Laterality: N/A;   MULTIPLE EXTRACTIONS WITH ALVEOLOPLASTY N/A 07/29/2014   Procedure: Extraction of tooth #'s 1,15,16,17,18 with alveoloplasty and gross debridement of teeth;  Surgeon: Lenn Cal, DDS;  Location: [place];  Service: Oral Surgery;  Laterality: N/A;   NECK SURGERY     Family History:  Family History  Problem Relation Age of Onset   Hypertension Maternal Grandmother    Family Psychiatric  History: Unknown Social History:  Social History   Substance and Sexual Activity  Alcohol Use Yes   Alcohol/week: 5.0 standard drinks   Types: 5 Standard drinks or equivalent per week   Comment: daily     Social History   Substance and Sexual Activity  Drug Use No    Social History   Socioeconomic History   Marital status: Single  Spouse name: Not on file   Number of children: Not on file   Years of education: Not on file   Highest education level: Not on file  Occupational History   Not on file  Tobacco Use   Smoking status: Some Days    Packs/day: 0.50    Types: Cigarettes   Smokeless tobacco: Never  Vaping Use   Vaping Use: Never used  Substance and Sexual Activity   Alcohol use: Yes    Alcohol/week: 5.0 standard drinks    Types: 5 Standard drinks or  equivalent per week    Comment: daily   Drug use: No   Sexual activity: Not Currently  Other Topics Concern   Not on file  Social History Narrative   Not on file   Social Determinants of Health   Financial Resource Strain: Not on file  Food Insecurity: Not on file  Transportation Needs: Not on file  Physical Activity: Not on file  Stress: Not on file  Social Connections: Not on file   Additional Social History:    Allergies:  No Known Allergies  Labs:  Results for orders placed or performed during the hospital encounter of 01/11/21 (from the past 48 hour(s))  Comprehensive metabolic panel     Status: Abnormal   Collection Time: 01/11/21  6:37 PM  Result Value Ref Range   Sodium 137 135 - 145 mmol/L   Potassium 3.1 (L) 3.5 - 5.1 mmol/L   Chloride 104 98 - 111 mmol/L   CO2 25 22 - 32 mmol/L   Glucose, Bld 97 70 - 99 mg/dL    Comment: Glucose reference range applies only to samples taken after fasting for at least 8 hours.   BUN 6 6 - 20 mg/dL   Creatinine, Ser 0.78 0.61 - 1.24 mg/dL   Calcium 9.3 8.9 - 10.3 mg/dL   Total Protein 6.8 6.5 - 8.1 g/dL   Albumin 3.6 3.5 - 5.0 g/dL   AST 24 15 - 41 U/L   ALT 14 0 - 44 U/L   Alkaline Phosphatase 58 38 - 126 U/L   Total Bilirubin 0.8 0.3 - 1.2 mg/dL   GFR, Estimated >60 >60 mL/min    Comment: (NOTE) Calculated using the CKD-EPI Creatinine Equation (2021)    Anion gap 8 5 - 15    Comment: Performed at [facility], [address] [place], [place] [address]  CBC with Differential     Status: Abnormal   Collection Time: 01/11/21  6:37 PM  Result Value Ref Range   WBC 6.0 4.0 - 10.5 K/uL   RBC 4.57 4.22 - 5.81 MIL/uL   Hemoglobin 12.4 (L) 13.0 - 17.0 g/dL   HCT 37.2 (L) 39.0 - 52.0 %   MCV 81.4 80.0 - 100.0 fL   MCH 27.1 26.0 - 34.0 pg   MCHC 33.3 30.0 - 36.0 g/dL   RDW 13.7 11.5 - 15.5 %   Platelets 291 150 - 400 K/uL   nRBC 0.0 0.0 - 0.2 %   Neutrophils Relative % 72 %   Neutro Abs 4.3 1.7 - 7.7 K/uL    Lymphocytes Relative 15 %   Lymphs Abs 0.9 0.7 - 4.0 K/uL   Monocytes Relative 9 %   Monocytes Absolute 0.6 0.1 - 1.0 K/uL   Eosinophils Relative 3 %   Eosinophils Absolute 0.2 0.0 - 0.5 K/uL   Basophils Relative 1 %   Basophils Absolute 0.1 0.0 - 0.1 K/uL   Immature Granulocytes 0 %  Abs Immature Granulocytes 0.01 0.00 - 0.07 K/uL    Comment: Performed at [facility], [address] [place], [place] [address]  Lactic acid, plasma     Status: None   Collection Time: 01/11/21  6:37 PM  Result Value Ref Range   Lactic Acid, Venous 1.5 0.5 - 1.9 mmol/L    Comment: Performed at [facility], [address] [place], [place] [address]  TSH     Status: None   Collection Time: 01/11/21  6:37 PM  Result Value Ref Range   TSH 2.022 0.350 - 4.500 uIU/mL    Comment: Performed by a 3rd Generation assay with a functional sensitivity of <=0.01 uIU/mL. Performed at [facility], [address] [place], [place] [address]   Culture, blood (routine x 2)     Status: None (Preliminary result)   Collection Time: 01/11/21  6:37 PM   Specimen: BLOOD  Result Value Ref Range   Specimen Description BLOOD LEFT FOREARM    Special Requests      BOTTLES DRAWN AEROBIC AND ANAEROBIC Blood Culture results may not be optimal due to an excessive volume of blood received in culture bottles   Culture      NO GROWTH < 12 HOURS Performed at [facility], [address], [place],  [address]    Report Status PENDING   Resp Panel by RT-PCR (Flu A&B, Covid) Nasopharyngeal Swab     Status: None   Collection Time: 01/11/21  6:57 PM   Specimen: Nasopharyngeal Swab; Nasopharyngeal(NP) swabs in vial transport medium  Result Value Ref Range   SARS Coronavirus 2 by RT PCR NEGATIVE NEGATIVE    Comment: (NOTE) SARS-CoV-2 target nucleic acids are NOT DETECTED.  The SARS-CoV-2 RNA is generally detectable in upper respiratory specimens during the acute phase of infection. The  lowest concentration of SARS-CoV-2 viral copies this assay can detect is 138 copies/mL. A negative result does not preclude SARS-Cov-2 infection and should not be used as the sole basis for treatment or other patient management decisions. A negative result may occur with  improper specimen collection/handling, submission of specimen other than nasopharyngeal swab, presence of viral mutation(s) within the areas targeted by this assay, and inadequate number of viral copies(<138 copies/mL). A negative result must be combined with clinical observations, patient history, and epidemiological information. The expected result is Negative.  Fact Sheet for Patients:  EntrepreneurPulse.com.au  Fact Sheet for Healthcare Providers:  IncredibleEmployment.be  This test is no t yet approved or cleared by the [place] FDA and  has been authorized for detection and/or diagnosis of SARS-CoV-2 by FDA under an Emergency Use Authorization (EUA). This EUA will remain  in effect (meaning this test can be used) for the duration of the COVID-19 declaration under Section 564(b)(1) of the Act, 21 U.S.C.section 360bbb-3(b)(1), unless the authorization is terminated  or revoked sooner.       Influenza A by PCR NEGATIVE NEGATIVE   Influenza B by PCR NEGATIVE NEGATIVE    Comment: (NOTE) The Xpert Xpress SARS-CoV-2/FLU/RSV plus assay is intended as an aid in the diagnosis of influenza from Nasopharyngeal swab specimens and should not be used as a sole basis for treatment. Nasal washings and aspirates are unacceptable for Xpert Xpress SARS-CoV-2/FLU/RSV testing.  Fact Sheet for Patients: EntrepreneurPulse.com.au  Fact Sheet for Healthcare Providers: IncredibleEmployment.be  This test is not yet approved or cleared by the [place] FDA and has been authorized for detection and/or diagnosis of SARS-CoV-2 by FDA  under an Emergency  Use Authorization (EUA). This EUA will remain in effect (meaning this test can be used) for the duration of the COVID-19 declaration under Section 564(b)(1) of the Act, 21 U.S.C. section 360bbb-3(b)(1), unless the authorization is terminated or revoked.  Performed at [facility], [address] [place], [place] [address]   Culture, blood (routine x 2)     Status: None (Preliminary result)   Collection Time: 01/11/21  6:58 PM   Specimen: BLOOD  Result Value Ref Range   Specimen Description BLOOD BLOOD RIGHT FOREARM    Special Requests      BOTTLES DRAWN AEROBIC AND ANAEROBIC Blood Culture adequate volume   Culture      NO GROWTH < 12 HOURS Performed at [facility], [address], [place], [place] [address]    Report Status PENDING   Urinalysis, Complete w Microscopic Urine, Clean Catch     Status: Abnormal   Collection Time: 01/11/21  8:20 PM  Result Value Ref Range   Color, Urine YELLOW YELLOW   APPearance CLEAR (A) CLEAR   Specific Gravity, Urine <1.005 (L) 1.005 - 1.030   pH 6.0 5.0 - 8.0   Glucose, UA NEGATIVE NEGATIVE mg/dL   Hgb urine dipstick NEGATIVE NEGATIVE   Bilirubin Urine NEGATIVE NEGATIVE   Ketones, ur NEGATIVE NEGATIVE mg/dL   Protein, ur NEGATIVE NEGATIVE mg/dL   Nitrite NEGATIVE NEGATIVE   Leukocytes,Ua NEGATIVE NEGATIVE   WBC, UA 0-5 0 - 5 WBC/hpf   Bacteria, UA NONE SEEN NONE SEEN   Squamous Epithelial / LPF NONE SEEN 0 - 5    Comment: Performed at [facility], [address] [place], [place] [address]  Lactic acid, plasma     Status: None   Collection Time: 01/11/21  8:20 PM  Result Value Ref Range   Lactic Acid, Venous 1.5 0.5 - 1.9 mmol/L    Comment: Performed at [facility], [address] [place], [place] [address]  Urine Drug Screen, Qualitative     Status: None   Collection Time: 01/11/21  8:20 PM  Result Value Ref Range   Tricyclic, Ur Screen NONE DETECTED NONE DETECTED   Amphetamines, Ur Screen  NONE DETECTED NONE DETECTED   MDMA (Ecstasy)Ur Screen NONE DETECTED NONE DETECTED   Cocaine Metabolite,Ur [place] NONE DETECTED NONE DETECTED   Opiate, Ur Screen NONE DETECTED NONE DETECTED   Phencyclidine (PCP) Ur S NONE DETECTED NONE DETECTED   Cannabinoid 50 Ng, Ur [place] NONE DETECTED NONE DETECTED   Barbiturates, Ur Screen NONE DETECTED NONE DETECTED   Benzodiazepine, Ur Scrn NONE DETECTED NONE DETECTED   Methadone Scn, Ur NONE DETECTED NONE DETECTED    Comment: (NOTE) Tricyclics + metabolites, urine    Cutoff 1000 ng/mL Amphetamines + metabolites, urine  Cutoff 1000 ng/mL MDMA (Ecstasy), urine              Cutoff 500 ng/mL Cocaine Metabolite, urine          Cutoff 300 ng/mL Opiate + metabolites, urine        Cutoff 300 ng/mL Phencyclidine (PCP), urine         Cutoff 25 ng/mL Cannabinoid, urine                 Cutoff 50 ng/mL Barbiturates + metabolites, urine  Cutoff 200 ng/mL Benzodiazepine, urine              Cutoff 200 ng/mL Methadone, urine  Cutoff 300 ng/mL  The urine drug screen provides only a preliminary, unconfirmed analytical test result and should not be used for non-medical purposes. Clinical consideration and professional judgment should be applied to any positive drug screen result due to possible interfering substances. A more specific alternate chemical method must be used in order to obtain a confirmed analytical result. Gas chromatography / mass spectrometry (GC/MS) is the preferred confirm atory method. Performed at [facility], [address] [place], [place] [address]     Current Facility-Administered Medications  Medication Dose Route Frequency Provider Last Rate Last Admin   divalproex (DEPAKOTE) DR tablet 500 mg  500 mg Oral Q12H Shanoah Asbill, Madie Reno, MD       No current outpatient medications on file.    Musculoskeletal: Strength & Muscle Tone: within normal limits Gait & Station: normal Patient leans:  N/A            Psychiatric Specialty Exam:  Presentation  General Appearance:  No data recorded Eye Contact: No data recorded Speech: No data recorded Speech Volume: No data recorded Handedness: No data recorded  Mood and Affect  Mood: No data recorded Affect: No data recorded  Thought Process  Thought Processes: No data recorded Descriptions of Associations:No data recorded Orientation:No data recorded Thought Content:No data recorded History of Schizophrenia/Schizoaffective disorder:No data recorded Duration of Psychotic Symptoms:No data recorded Hallucinations:No data recorded Ideas of Reference:No data recorded Suicidal Thoughts:No data recorded Homicidal Thoughts:No data recorded  Sensorium  Memory: No data recorded Judgment: No data recorded Insight: No data recorded  Executive Functions  Concentration: No data recorded Attention Span: No data recorded Recall: No data recorded Fund of Knowledge: No data recorded Language: No data recorded  Psychomotor Activity  Psychomotor Activity: No data recorded  Assets  Assets: No data recorded  Sleep  Sleep: No data recorded  Physical Exam: Physical Exam Vitals and nursing note reviewed.  Constitutional:      Appearance: Normal appearance.  HENT:     Head: Normocephalic and atraumatic.     Mouth/Throat:     Pharynx: Oropharynx is clear.  Eyes:     Pupils: Pupils are equal, round, and reactive to light.  Cardiovascular:     Rate and Rhythm: Normal rate and regular rhythm.  Pulmonary:     Effort: Pulmonary effort is normal.     Breath sounds: Normal breath sounds.  Abdominal:     General: Abdomen is flat.     Palpations: Abdomen is soft.  Musculoskeletal:        General: Normal range of motion.  Skin:    General: Skin is warm and dry.       Neurological:     General: No focal deficit present.     Mental Status: He is alert. Mental status is at baseline.  Psychiatric:         Attention and Perception: He is inattentive.        Mood and Affect: Mood normal. Affect is inappropriate.        Speech: Speech is rapid and pressured.        Behavior: Behavior is not agitated.        Thought Content: Thought content is paranoid and delusional.        Cognition and Memory: Cognition is impaired. Memory is impaired.        Judgment: Judgment is inappropriate.   Review of Systems  Constitutional: Negative.   HENT: Negative.    Eyes: Negative.   Respiratory:  Negative.    Cardiovascular: Negative.   Gastrointestinal: Negative.   Musculoskeletal: Negative.   Skin:  Positive for rash.  Neurological: Negative.   Psychiatric/Behavioral:  Positive for hallucinations and memory loss. Negative for depression, substance abuse and suicidal ideas. The patient is nervous/anxious and has insomnia.   Blood pressure (!) 119/94, pulse 85, temperature 97.8 F (36.6 C), temperature source Oral, resp. rate 20, height 5' 5"  (1.651 m), weight 64.9 kg, SpO2 100 %. Body mass index is 23.8 kg/m.  Treatment Plan Summary: Medication management and Plan a 40 year old man presents with disorganized thinking paranoia auditory hallucinations poor self-care.  Unable to even rationally answer questions about whether or not he gets a check and how his money is used.  Patient is malodorous and dirty.  The skin areas he is concerned about look like they could possibly be hidradenitis or just skin infections but not florid cellulitis from what I can see.  Because of history of psychosis and current disorganized presentation patient meets criteria for involuntary commitment and hospitalization.  After reviewing the old chart it seems more reasonable to start medicine for bipolar disorder than antipsychotic specifically.  Orders placed to start Depakote 500 mg twice a day.  Patient will be referred out to outside hospitals for admission.  IVC filed.  Disposition: Recommend psychiatric Inpatient admission  when medically cleared.  Alethia Berthold, MD 01/12/2021 1:03 PM

## 2021-01-12 NOTE — ED Notes (Signed)
Pt given breakfast tray

## 2021-01-12 NOTE — ED Notes (Addendum)
Pt belongings include 6 total bags. Pt is homeless and unable to go through each bag at this but there is a total of 6 bags

## 2021-01-12 NOTE — ED Notes (Signed)
VOL/pending psych consult

## 2021-01-12 NOTE — ED Provider Notes (Signed)
Emergency Medicine Observation Re-evaluation Note  Carl Carey is a 40 y.o. male, seen on rounds today.  Pt initially presented to the ED for complaints of No chief complaint on file. Currently, the patient is resting, voices no medical complaints.  Physical Exam  BP 105/85 (BP Location: Right Arm)   Pulse 60   Temp 98.1 F (36.7 C)   Resp 16   Ht 5' 5"  (1.651 m)   Wt 64.9 kg   SpO2 97%   BMI 23.80 kg/m  Physical Exam General: Resting in no acute distress Cardiac: No cyanosis Lungs: Equal rise and fall Psych: Not agitated  ED Course / MDM  EKG:   I have reviewed the labs performed to date as well as medications administered while in observation.  Recent changes in the last 24 hours include no events overnight.  Plan  Current plan is for psychiatric disposition.  Carl Carey is not under involuntary commitment.     Paulette Blanch, MD 01/12/21 [phone]

## 2021-01-12 NOTE — ED Notes (Signed)
Pt given dinner tray.

## 2021-01-12 NOTE — ED Notes (Signed)
Lunch tray given at this time.

## 2021-01-13 DIAGNOSIS — F319 Bipolar disorder, unspecified: Secondary | ICD-10-CM

## 2021-01-13 LAB — URINE CULTURE: Culture: NO GROWTH

## 2021-01-13 NOTE — ED Notes (Signed)
Pt resting in room watching tv.  P.m. snack given.  Pt is calm and cooperative

## 2021-01-13 NOTE — BH Assessment (Signed)
Referral information for Psychiatric Hospitalization faxed to:  [facility] [phone]),   [facility] [phone]),  [place] [phone], [phone], [phone] or [phone]),   [place] [phone] or [phone])  [facility] [phone]),   [facility] [phone] -or- [phone]),   [facility] [phone]).  [facility] [phone])

## 2021-01-13 NOTE — Consult Note (Signed)
[facility] Consult   Reason for Consult: Follow-up consult 40 year old man with a history of schizoaffective disorder who has been in the emergency room since yesterday awaiting inpatient Referring Physician:  Bradler Patient Identification: Carl Carey MRN:  [id number] Principal Diagnosis: Bipolar 1 disorder ([place]) Diagnosis:  Principal Problem:   Bipolar 1 disorder ([place]) Active Problems:   Skin infection   Total Time spent with patient: 30 minutes  Subjective:   Jabarie Pop is a 40 y.o. male patient admitted with patient did reply verbally but his speech is essentially word salad and virtually incomprehensible.  HPI: See yesterday's note.  Patient with a history of chronic psychotic disorder who has apparently been living on the street off of medication.  Poor self-care poor health.  Patient was kept under IVC and inpatient treatment recommended.  Depakote was initiated given his prior documentation of possible poor response to antipsychotics.  Patient is calm this morning laying in bed watching TV.  When spoken to he replies but it is impossible to make any sense of what he says.  I did hear the word suicidal among the things he said but have no idea what the context was.  Not able to engage in conversation and to suggest making reasonable decisions  Past Psychiatric History: Past history of chronic psychotic disorder.  Some concern and prior hospitalizations about bad reactions to medications.  Risk to Self:   Risk to Others:   Prior Inpatient Therapy:   Prior Outpatient Therapy:    Past Medical History:  Past Medical History:  Diagnosis Date   Bipolar 1 disorder ([place])    Diabetes ([place])    Malnutrition ([place])    Panic attack    Parkinsonism ([place])     Past Surgical History:  Procedure Laterality Date   BIOPSY  01/21/2019   Procedure: BIOPSY;  Surgeon: Otis Brace, MD;  Location: [facility];  Service: Gastroenterology;;   COLONOSCOPY WITH PROPOFOL  N/A 01/21/2019   Procedure: COLONOSCOPY WITH PROPOFOL;  Surgeon: Otis Brace, MD;  Location: [facility];  Service: Gastroenterology;  Laterality: N/A;   ESOPHAGOGASTRODUODENOSCOPY (EGD) WITH PROPOFOL N/A 01/21/2019   Procedure: ESOPHAGOGASTRODUODENOSCOPY (EGD) WITH PROPOFOL;  Surgeon: Otis Brace, MD;  Location: [facility];  Service: Gastroenterology;  Laterality: N/A;   MULTIPLE EXTRACTIONS WITH ALVEOLOPLASTY N/A 07/29/2014   Procedure: Extraction of tooth #'s 1,15,16,17,18 with alveoloplasty and gross debridement of teeth;  Surgeon: Lenn Cal, DDS;  Location: [facility];  Service: Oral Surgery;  Laterality: N/A;   NECK SURGERY     Family History:  Family History  Problem Relation Age of Onset   Hypertension Maternal Grandmother    Family Psychiatric  History: None reported Social History:  Social History   Substance and Sexual Activity  Alcohol Use Yes   Alcohol/week: 5.0 standard drinks   Types: 5 Standard drinks or equivalent per week   Comment: daily     Social History   Substance and Sexual Activity  Drug Use No    Social History   Socioeconomic History   Marital status: Single    Spouse name: Not on file   Number of children: Not on file   Years of education: Not on file   Highest education level: Not on file  Occupational History   Not on file  Tobacco Use   Smoking status: Some Days    Packs/day: 0.50    Types: Cigarettes   Smokeless tobacco: Never  Vaping Use   Vaping Use: Never used  Substance and  Sexual Activity   Alcohol use: Yes    Alcohol/week: 5.0 standard drinks    Types: 5 Standard drinks or equivalent per week    Comment: daily   Drug use: No   Sexual activity: Not Currently  Other Topics Concern   Not on file  Social History Narrative   Not on file   Social Determinants of Health   Financial Resource Strain: Not on file  Food Insecurity: Not on file  Transportation Needs: Not on file  Physical Activity: Not on file   Stress: Not on file  Social Connections: Not on file   Additional Social History:    Allergies:  No Known Allergies  Labs:  Results for orders placed or performed during the hospital encounter of 01/11/21 (from the past 48 hour(s))  Comprehensive metabolic panel     Status: Abnormal   Collection Time: 01/11/21  6:37 PM  Result Value Ref Range   Sodium 137 135 - 145 mmol/L   Potassium 3.1 (L) 3.5 - 5.1 mmol/L   Chloride 104 98 - 111 mmol/L   CO2 25 22 - 32 mmol/L   Glucose, Bld 97 70 - 99 mg/dL    Comment: Glucose reference range applies only to samples taken after fasting for at least 8 hours.   BUN 6 6 - 20 mg/dL   Creatinine, Ser 0.78 0.61 - 1.24 mg/dL   Calcium 9.3 8.9 - 10.3 mg/dL   Total Protein 6.8 6.5 - 8.1 g/dL   Albumin 3.6 3.5 - 5.0 g/dL   AST 24 15 - 41 U/L   ALT 14 0 - 44 U/L   Alkaline Phosphatase 58 38 - 126 U/L   Total Bilirubin 0.8 0.3 - 1.2 mg/dL   GFR, Estimated >60 >60 mL/min    Comment: (NOTE) Calculated using the CKD-EPI Creatinine Equation (2021)    Anion gap 8 5 - 15    Comment: Performed at [facility], [address] [place], [place] [address]  CBC with Differential     Status: Abnormal   Collection Time: 01/11/21  6:37 PM  Result Value Ref Range   WBC 6.0 4.0 - 10.5 K/uL   RBC 4.57 4.22 - 5.81 MIL/uL   Hemoglobin 12.4 (L) 13.0 - 17.0 g/dL   HCT 37.2 (L) 39.0 - 52.0 %   MCV 81.4 80.0 - 100.0 fL   MCH 27.1 26.0 - 34.0 pg   MCHC 33.3 30.0 - 36.0 g/dL   RDW 13.7 11.5 - 15.5 %   Platelets 291 150 - 400 K/uL   nRBC 0.0 0.0 - 0.2 %   Neutrophils Relative % 72 %   Neutro Abs 4.3 1.7 - 7.7 K/uL   Lymphocytes Relative 15 %   Lymphs Abs 0.9 0.7 - 4.0 K/uL   Monocytes Relative 9 %   Monocytes Absolute 0.6 0.1 - 1.0 K/uL   Eosinophils Relative 3 %   Eosinophils Absolute 0.2 0.0 - 0.5 K/uL   Basophils Relative 1 %   Basophils Absolute 0.1 0.0 - 0.1 K/uL   Immature Granulocytes 0 %   Abs Immature Granulocytes 0.01 0.00 - 0.07 K/uL     Comment: Performed at [facility], [address] [place], [place] [address]  Lactic acid, plasma     Status: None   Collection Time: 01/11/21  6:37 PM  Result Value Ref Range   Lactic Acid, Venous 1.5 0.5 - 1.9 mmol/L    Comment: Performed at [facility], [address]  [place], [place] [address]  TSH     Status: None   Collection Time: 01/11/21  6:37 PM  Result Value Ref Range   TSH 2.022 0.350 - 4.500 uIU/mL    Comment: Performed by a 3rd Generation assay with a functional sensitivity of <=0.01 uIU/mL. Performed at [facility], [address] [place], [place] [address]   Culture, blood (routine x 2)     Status: None (Preliminary result)   Collection Time: 01/11/21  6:37 PM   Specimen: BLOOD  Result Value Ref Range   Specimen Description BLOOD LEFT FOREARM    Special Requests      BOTTLES DRAWN AEROBIC AND ANAEROBIC Blood Culture results may not be optimal due to an excessive volume of blood received in culture bottles   Culture      NO GROWTH 2 DAYS Performed at [facility], [address], [place], [place] [address]    Report Status PENDING   Resp Panel by RT-PCR (Flu A&B, Covid) Nasopharyngeal Swab     Status: None   Collection Time: 01/11/21  6:57 PM   Specimen: Nasopharyngeal Swab; Nasopharyngeal(NP) swabs in vial transport medium  Result Value Ref Range   SARS Coronavirus 2 by RT PCR NEGATIVE NEGATIVE    Comment: (NOTE) SARS-CoV-2 target nucleic acids are NOT DETECTED.  The SARS-CoV-2 RNA is generally detectable in upper respiratory specimens during the acute phase of infection. The lowest concentration of SARS-CoV-2 viral copies this assay can detect is 138 copies/mL. A negative result does not preclude SARS-Cov-2 infection and should not be used as the sole basis for treatment or other patient management decisions. A negative result may occur with  improper specimen collection/handling, submission of specimen other than  nasopharyngeal swab, presence of viral mutation(s) within the areas targeted by this assay, and inadequate number of viral copies(<138 copies/mL). A negative result must be combined with clinical observations, patient history, and epidemiological information. The expected result is Negative.  Fact Sheet for Patients:  EntrepreneurPulse.com.au  Fact Sheet for Healthcare Providers:  IncredibleEmployment.be  This test is no t yet approved or cleared by the [place] FDA and  has been authorized for detection and/or diagnosis of SARS-CoV-2 by FDA under an Emergency Use Authorization (EUA). This EUA will remain  in effect (meaning this test can be used) for the duration of the COVID-19 declaration under Section 564(b)(1) of the Act, 21 U.S.C.section 360bbb-3(b)(1), unless the authorization is terminated  or revoked sooner.       Influenza A by PCR NEGATIVE NEGATIVE   Influenza B by PCR NEGATIVE NEGATIVE    Comment: (NOTE) The Xpert Xpress SARS-CoV-2/FLU/RSV plus assay is intended as an aid in the diagnosis of influenza from Nasopharyngeal swab specimens and should not be used as a sole basis for treatment. Nasal washings and aspirates are unacceptable for Xpert Xpress SARS-CoV-2/FLU/RSV testing.  Fact Sheet for Patients: EntrepreneurPulse.com.au  Fact Sheet for Healthcare Providers: IncredibleEmployment.be  This test is not yet approved or cleared by the [place] FDA and has been authorized for detection and/or diagnosis of SARS-CoV-2 by FDA under an Emergency Use Authorization (EUA). This EUA will remain in effect (meaning this test can be used) for the duration of the COVID-19 declaration under Section 564(b)(1) of the Act, 21 U.S.C. section 360bbb-3(b)(1), unless the authorization is terminated or revoked.  Performed at [facility], [address] [place], [place] [address]    Culture, blood (routine x 2)     Status: None (Preliminary result)  Collection Time: 01/11/21  6:58 PM   Specimen: BLOOD  Result Value Ref Range   Specimen Description BLOOD BLOOD RIGHT FOREARM    Special Requests      BOTTLES DRAWN AEROBIC AND ANAEROBIC Blood Culture adequate volume   Culture      NO GROWTH 2 DAYS Performed at [facility], [address], [place], [place] [address]    Report Status PENDING   Urinalysis, Complete w Microscopic Urine, Clean Catch     Status: Abnormal   Collection Time: 01/11/21  8:20 PM  Result Value Ref Range   Color, Urine YELLOW YELLOW   APPearance CLEAR (A) CLEAR   Specific Gravity, Urine <1.005 (L) 1.005 - 1.030   pH 6.0 5.0 - 8.0   Glucose, UA NEGATIVE NEGATIVE mg/dL   Hgb urine dipstick NEGATIVE NEGATIVE   Bilirubin Urine NEGATIVE NEGATIVE   Ketones, ur NEGATIVE NEGATIVE mg/dL   Protein, ur NEGATIVE NEGATIVE mg/dL   Nitrite NEGATIVE NEGATIVE   Leukocytes,Ua NEGATIVE NEGATIVE   WBC, UA 0-5 0 - 5 WBC/hpf   Bacteria, UA NONE SEEN NONE SEEN   Squamous Epithelial / LPF NONE SEEN 0 - 5    Comment: Performed at [facility], [address] [place], [place] [address]  Lactic acid, plasma     Status: None   Collection Time: 01/11/21  8:20 PM  Result Value Ref Range   Lactic Acid, Venous 1.5 0.5 - 1.9 mmol/L    Comment: Performed at [facility], [address], [place], [place] [address]  Urine Culture     Status: None   Collection Time: 01/11/21  8:20 PM   Specimen: Urine, Random  Result Value Ref Range   Specimen Description      URINE, RANDOM Performed at [facility], [address], [place], [place] [address]    Special Requests      NONE Performed at [facility], [address], [place], [place] [address]    Culture      NO GROWTH Performed at [facility], [place] [address], [place], [place] [address]    Report Status 01/13/2021 FINAL   Urine Drug Screen, Qualitative      Status: None   Collection Time: 01/11/21  8:20 PM  Result Value Ref Range   Tricyclic, Ur Screen NONE DETECTED NONE DETECTED   Amphetamines, Ur Screen NONE DETECTED NONE DETECTED   MDMA (Ecstasy)Ur Screen NONE DETECTED NONE DETECTED   Cocaine Metabolite,Ur [place] NONE DETECTED NONE DETECTED   Opiate, Ur Screen NONE DETECTED NONE DETECTED   Phencyclidine (PCP) Ur S NONE DETECTED NONE DETECTED   Cannabinoid 50 Ng, Ur  NONE DETECTED NONE DETECTED   Barbiturates, Ur Screen NONE DETECTED NONE DETECTED   Benzodiazepine, Ur Scrn NONE DETECTED NONE DETECTED   Methadone Scn, Ur NONE DETECTED NONE DETECTED    Comment: (NOTE) Tricyclics + metabolites, urine    Cutoff 1000 ng/mL Amphetamines + metabolites, urine  Cutoff 1000 ng/mL MDMA (Ecstasy), urine              Cutoff 500 ng/mL Cocaine Metabolite, urine          Cutoff 300 ng/mL Opiate + metabolites, urine        Cutoff 300 ng/mL Phencyclidine (PCP), urine         Cutoff 25 ng/mL Cannabinoid, urine                 Cutoff 50 ng/mL Barbiturates + metabolites, urine  Cutoff 200 ng/mL Benzodiazepine, urine              Cutoff 200 ng/mL Methadone, urine                   Cutoff 300 ng/mL  The urine drug screen provides only a preliminary, unconfirmed analytical test result and should not be used for non-medical purposes. Clinical consideration and professional judgment should be applied to any positive drug screen result due to possible interfering substances. A more specific alternate chemical method must be used in order to obtain a confirmed analytical result. Gas chromatography / mass spectrometry (GC/MS) is the preferred confirm atory method. Performed at [facility], [address] [place], [place] [address]     Current Facility-Administered Medications  Medication Dose Route Frequency Provider Last Rate Last Admin   divalproex (DEPAKOTE) DR tablet 500 mg  500 mg Oral Q12H Watt Geiler, Madie Reno, MD   500 mg at 01/13/21 1025    No current outpatient medications on file.    Musculoskeletal: Strength & Muscle Tone: within normal limits Gait & Station: normal Patient leans: N/A            Psychiatric Specialty Exam:  Presentation  General Appearance:  No data recorded Eye Contact: No data recorded Speech: No data recorded Speech Volume: No data recorded Handedness: No data recorded  Mood and Affect  Mood: No data recorded Affect: No data recorded  Thought Process  Thought Processes: No data recorded Descriptions of Associations:No data recorded Orientation:No data recorded Thought Content:No data recorded History of Schizophrenia/Schizoaffective disorder:No data recorded Duration of Psychotic Symptoms:No data recorded Hallucinations:No data recorded Ideas of Reference:No data recorded Suicidal Thoughts:No data recorded Homicidal Thoughts:No data recorded  Sensorium  Memory: No data recorded Judgment: No data recorded Insight: No data recorded  Executive Functions  Concentration: No data recorded Attention Span: No data recorded Recall: No data recorded Fund of Knowledge: No data recorded Language: No data recorded  Psychomotor Activity  Psychomotor Activity: No data recorded  Assets  Assets: No data recorded  Sleep  Sleep: No data recorded  Physical Exam: Physical Exam Vitals and nursing note reviewed.  Constitutional:      Appearance: Normal appearance.  HENT:     Head: Normocephalic and atraumatic.     Mouth/Throat:     Pharynx: Oropharynx is clear.  Eyes:     Pupils: Pupils are equal, round, and reactive to light.  Cardiovascular:     Rate and Rhythm: Normal rate and regular rhythm.  Pulmonary:     Effort: Pulmonary effort is normal.     Breath sounds: Normal breath sounds.  Abdominal:     General: Abdomen is flat.     Palpations: Abdomen is soft.  Musculoskeletal:        General: Normal range of motion.  Skin:    General: Skin is  warm and dry.  Neurological:     General: No focal deficit present.     Mental Status: He is alert. Mental status is at baseline.  Psychiatric:        Attention and Perception: He is inattentive.        Mood and Affect: Mood normal. Affect is blunt.        Speech: Speech is rapid and pressured.        Behavior: Behavior is not agitated.        Thought Content: Thought content is paranoid and delusional.        Cognition  and Memory: Cognition is impaired.   Review of Systems  Constitutional: Negative.   HENT: Negative.    Eyes: Negative.   Respiratory: Negative.    Cardiovascular: Negative.   Gastrointestinal: Negative.   Musculoskeletal: Negative.   Skin: Negative.   Neurological: Negative.   Psychiatric/Behavioral: Negative.    Blood pressure (!) 101/58, pulse 71, temperature 98.7 F (37.1 C), temperature source Oral, resp. rate 18, height 5' 5"  (1.651 m), weight 64.9 kg, SpO2 100 %. Body mass index is 23.8 kg/m.  Treatment Plan Summary: Plan patient continues to meet criteria for inpatient hospitalization.  No change today to medication.  Continue to refer for inpatient treatment.  Explained situation to patient.  No current change to plan.  TTS up-to-date on plan.  Disposition: Recommend psychiatric Inpatient admission when medically cleared.  Alethia Berthold, MD 01/13/2021 11:31 AM

## 2021-01-13 NOTE — ED Notes (Signed)
RN attempted to report.   Staff at accepting facility stated they would call for report in approximately 10 mins.

## 2021-01-13 NOTE — ED Notes (Signed)
Lunch tray given. No other needs found at this moment.

## 2021-01-13 NOTE — ED Notes (Signed)
Pt discharged to [facility] under IVC.  All patient belonging bags given to officer.  Pt cooperative.

## 2021-01-13 NOTE — BH Assessment (Signed)
Patient has been accepted to [facility] on today 01/13/21.  Patient assigned to [facility] Accepting physician is Dr. Otho Najjar.  Call report to [phone].  Representative was [employer].   ER Staff is aware of it:  [place], ER Secretary  Dr. Cheri Fowler, ER MD  Joellen Jersey, Patient's Nurse

## 2021-01-16 LAB — CULTURE, BLOOD (ROUTINE X 2)
Culture: NO GROWTH
Culture: NO GROWTH
Special Requests: ADEQUATE

## 2021-05-22 IMAGING — CT CT ENTEROGRAPHY (ABD-PELV W/ CM)
2 of 6 series · 14 of 46 positions shown, 16 images · IV contrast (omnipaque)
Comparison: CT chest abdomen pelvis, 01/16/2019

CLINICAL DATA: Inflammatory bowel disease

EXAM:
CT ABDOMEN AND PELVIS WITH CONTRAST (ENTEROGRAPHY)
TECHNIQUE: Multidetector CT of the abdomen and pelvis during bolus
administration of intravenous contrast. Negative oral contrast was
given.
CONTRAST:  100mL OMNIPAQUE IOHEXOL 300 MG/ML  SOLN

[Series 4: entero 2mm · axial · 0.66mm/px · z∈[+756,+1168]mm · 11 of 232 slices shown, 13 images]
[im 13/232  soft-tissue]
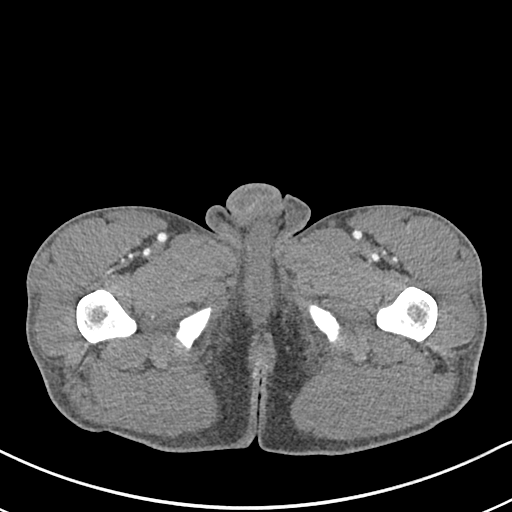
[im 13/232  bone]
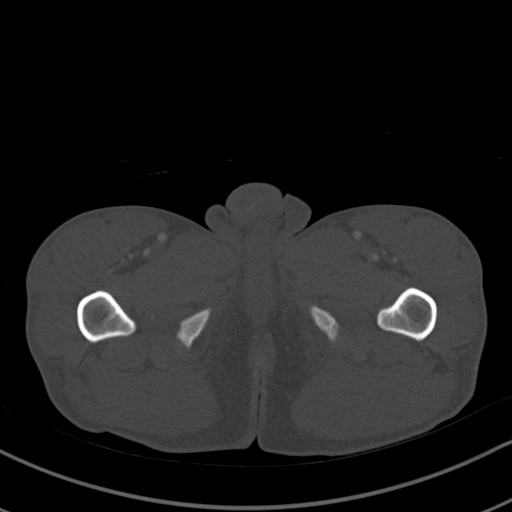
[im 39/232  soft-tissue]
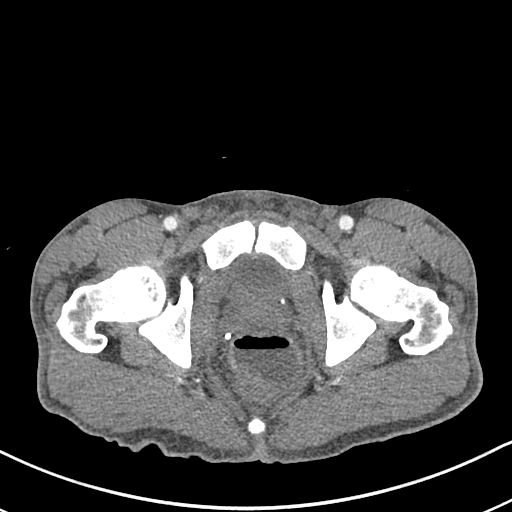
[im 52/232  soft-tissue]
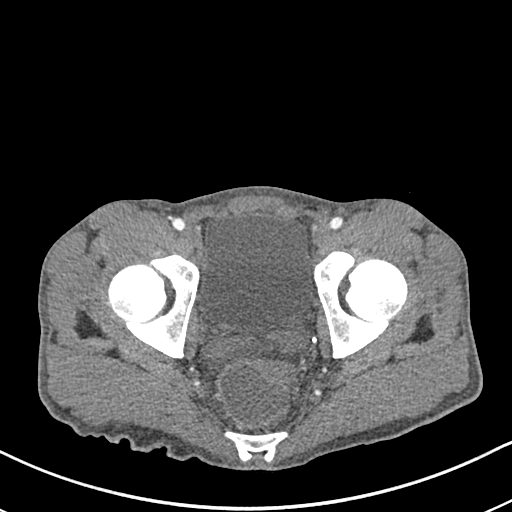
[im 78/232  soft-tissue]
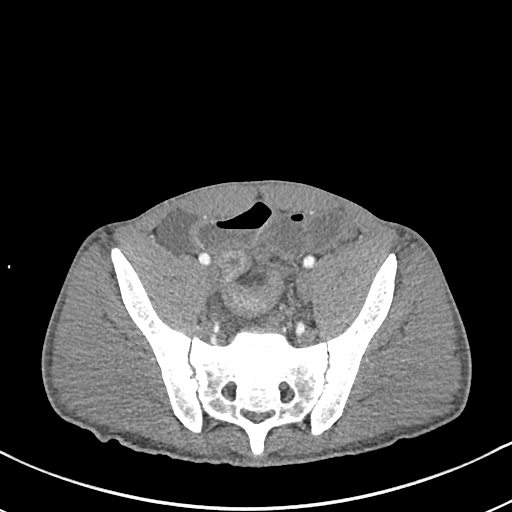
[im 90/232  soft-tissue]
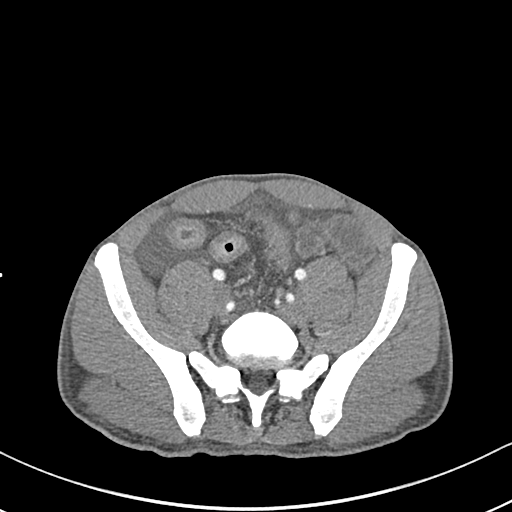
[im 116/232  soft-tissue]
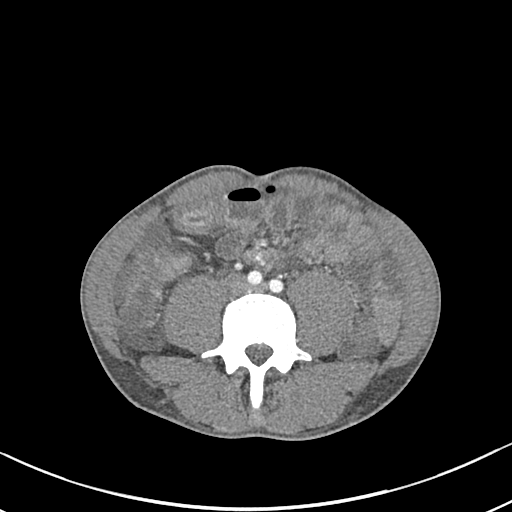
[im 142/232  soft-tissue]
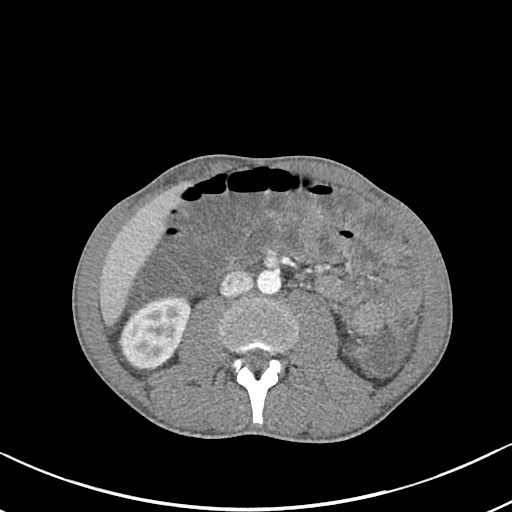
[im 155/232  soft-tissue]
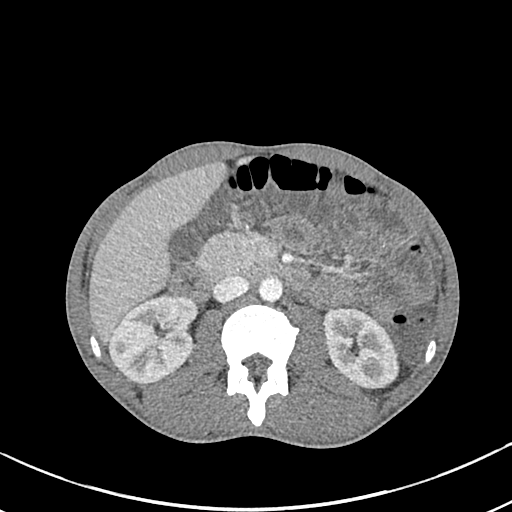
[im 180/232  soft-tissue]
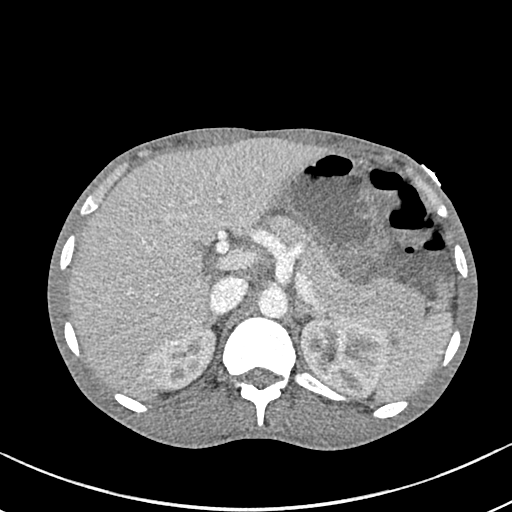
[im 180/232  bone]
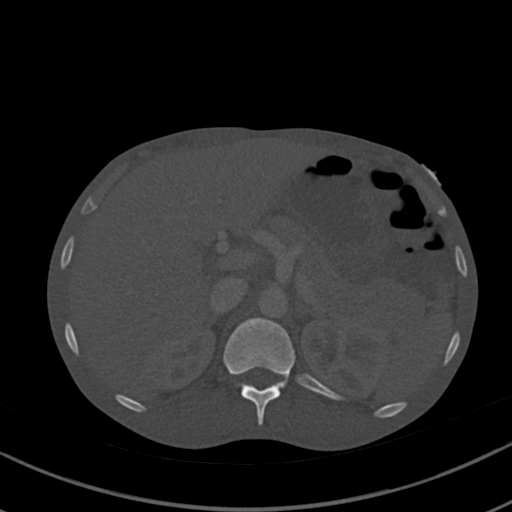
[im 193/232  soft-tissue]
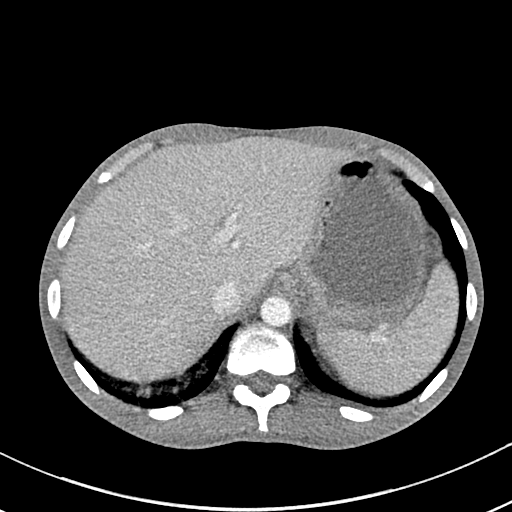
[im 219/232  soft-tissue]
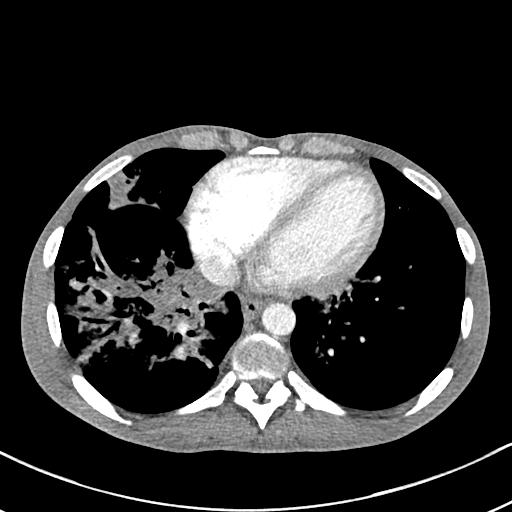

[Series 6: entero 2.0 cor · coronal · 0.64mm/px · 3 of 142 slices shown]
[im 48/142  soft-tissue]
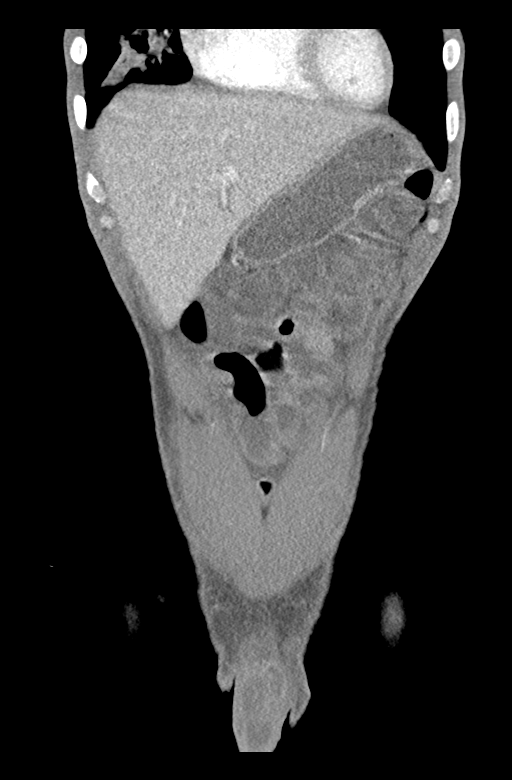
[im 63/142  soft-tissue]
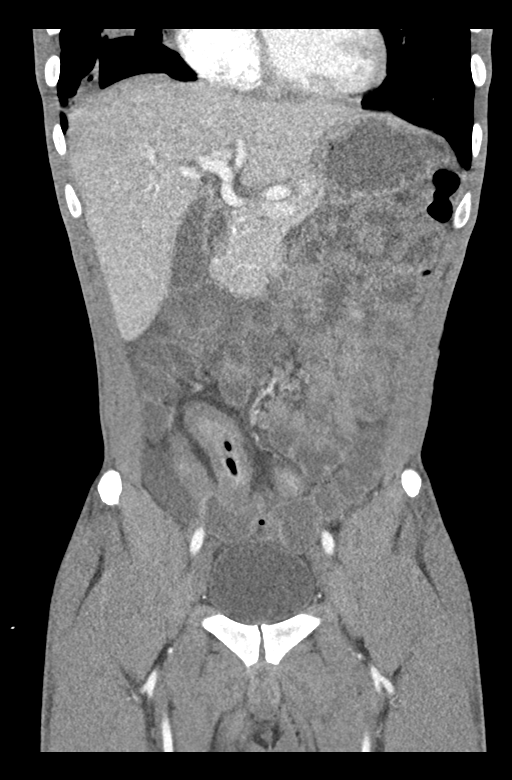
[im 79/142  soft-tissue]
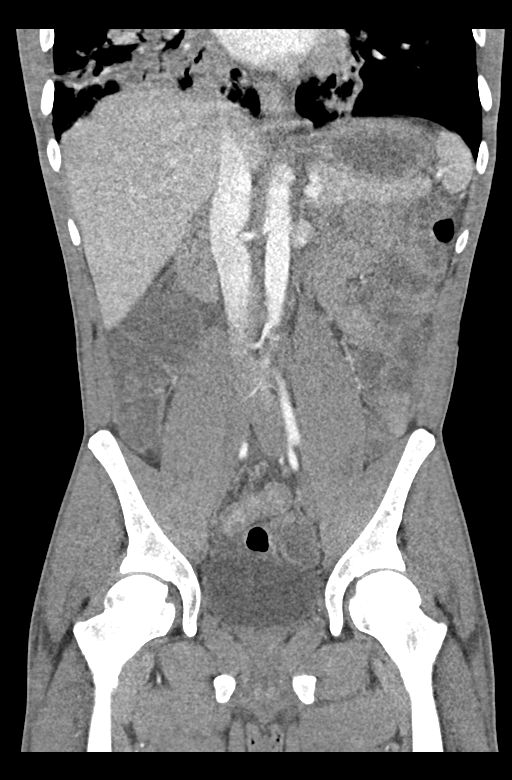

[14 of 46 positions shown; findings below may reference images not displayed]

FINDINGS: Lower chest: There is extensive, right greater than left bibasilar
heterogeneous and consolidative airspace disease, which is worsened
in comparison to recent CT dated 01/16/2019.

Hepatobiliary: No solid liver abnormality is seen. No gallstones,
gallbladder wall thickening, or biliary dilatation.

Pancreas: Unremarkable. No pancreatic ductal dilatation or
surrounding inflammatory changes.

Spleen: Normal in size without significant abnormality.

Adrenals/Urinary Tract: Adrenal glands are unremarkable. Kidneys are
normal, without renal calculi, solid lesion, or hydronephrosis.
Bladder is unremarkable.

Stomach/Bowel: Stomach is within normal limits. Appendix appears
normal. The small bowel and colon are fluid-filled by negative
contrast to the rectum without overt distention. There is
redemonstrated thickening and luminal narrowing of two segments of
terminal ileum involving the distal ~30 cm of the ileum separated
by a short segment of relatively normal appearing bowel, this
overall appearance with somewhat fixed, tethered configuration in
comparison to prior examination (series 3, image 51, series 6, image
66, 75).

Vascular/Lymphatic: No significant vascular findings are present. No
enlarged abdominal or pelvic lymph nodes.

Reproductive: No mass or other significant abnormality.

Other: No abdominal wall hernia or abnormality. Small volume free
fluid in the right lower quadrant and low pelvis.

Musculoskeletal: There is sclerosis of the bilateral sacroiliac
joints (series 4, image 157).
IMPRESSION: 1. There is redemonstrated thickening and luminal narrowing of two
segments of terminal ileum involving the distal ~30 cm of the
ileum separated by a short segment of relatively normal appearing
bowel, this overall appearance with somewhat fixed, tethered
configuration in comparison to prior examination (series 3, image
51, series 6, image 66, 75). Findings are consistent with infectious
or inflammatory enteritis, particularly Crohn's disease given this
appearance and distribution. The involved segments are suspicious
for stricture although there is no evidence of overt bowel
obstruction. No evidence of fistula or complication by abscess
formation.

2. The remainder of the bowel is normal in appearance, fluid filled
by negative enteric contrast.

3. Small volume free fluid in the right lower quadrant and low
pelvis, likely reactive.

4. There is extensive, right greater than left bibasilar
heterogeneous and consolidative airspace disease, which is worsened
in comparison to recent CT dated 01/16/2019 and consistent with
multifocal infection.

5. There is sclerosis of the bilateral sacroiliac joints (series 4,
image 157), advanced for patient age and consistent with
inflammatory sacroiliitis, as can be seen in association with
inflammatory bowel disease.

## 2022-08-03 ENCOUNTER — Emergency Department (HOSPITAL_COMMUNITY)
Admission: EM | Admit: 2022-08-03 | Discharge: 2022-08-03 | Disposition: A | Discharge status: Home / Self Care | Payer: Medicaid Other | Attending: Emergency Medicine | Admitting: Emergency Medicine

## 2022-08-03 ENCOUNTER — Encounter (HOSPITAL_COMMUNITY): Payer: Self-pay | Admitting: Emergency Medicine

## 2022-08-03 ENCOUNTER — Other Ambulatory Visit: Payer: Self-pay

## 2022-08-03 ENCOUNTER — Emergency Department (HOSPITAL_BASED_OUTPATIENT_CLINIC_OR_DEPARTMENT_OTHER): Payer: Medicaid Other

## 2022-08-03 DIAGNOSIS — M79661 Pain in right lower leg: Secondary | ICD-10-CM | POA: Diagnosis not present

## 2022-08-03 DIAGNOSIS — E876 Hypokalemia: Secondary | ICD-10-CM

## 2022-08-03 DIAGNOSIS — M7989 Other specified soft tissue disorders: Secondary | ICD-10-CM | POA: Diagnosis not present

## 2022-08-03 DIAGNOSIS — R21 Rash and other nonspecific skin eruption: Secondary | ICD-10-CM | POA: Diagnosis not present

## 2022-08-03 DIAGNOSIS — Z5900 Homelessness unspecified: Secondary | ICD-10-CM | POA: Diagnosis not present

## 2022-08-03 DIAGNOSIS — Z7901 Long term (current) use of anticoagulants: Secondary | ICD-10-CM | POA: Diagnosis not present

## 2022-08-03 DIAGNOSIS — F17210 Nicotine dependence, cigarettes, uncomplicated: Secondary | ICD-10-CM | POA: Diagnosis not present

## 2022-08-03 DIAGNOSIS — M79604 Pain in right leg: Secondary | ICD-10-CM

## 2022-08-03 LAB — CBC WITH DIFFERENTIAL/PLATELET
Abs Immature Granulocytes: 0.01 10*3/uL (ref 0.00–0.07)
Basophils Absolute: 0 10*3/uL (ref 0.0–0.1)
Basophils Relative: 1 %
Eosinophils Absolute: 0.3 10*3/uL (ref 0.0–0.5)
Eosinophils Relative: 6 %
HCT: 39.9 % (ref 39.0–52.0)
Hemoglobin: 12.4 g/dL — ABNORMAL LOW (ref 13.0–17.0)
Immature Granulocytes: 0 %
Lymphocytes Relative: 17 %
Lymphs Abs: 0.9 10*3/uL (ref 0.7–4.0)
MCH: 25.8 pg — ABNORMAL LOW (ref 26.0–34.0)
MCHC: 31.1 g/dL (ref 30.0–36.0)
MCV: 83.1 fL (ref 80.0–100.0)
Monocytes Absolute: 0.6 10*3/uL (ref 0.1–1.0)
Monocytes Relative: 11 %
Neutro Abs: 3.5 10*3/uL (ref 1.7–7.7)
Neutrophils Relative %: 65 %
Platelets: 302 10*3/uL (ref 150–400)
RBC: 4.8 MIL/uL (ref 4.22–5.81)
RDW: 14.4 % (ref 11.5–15.5)
WBC: 5.4 10*3/uL (ref 4.0–10.5)
nRBC: 0 % (ref 0.0–0.2)

## 2022-08-03 LAB — RAPID URINE DRUG SCREEN, HOSP PERFORMED
Amphetamines: NOT DETECTED
Barbiturates: NOT DETECTED
Benzodiazepines: NOT DETECTED
Cocaine: NOT DETECTED
Opiates: NOT DETECTED
Tetrahydrocannabinol: NOT DETECTED

## 2022-08-03 LAB — COMPREHENSIVE METABOLIC PANEL
ALT: 13 U/L (ref 0–44)
AST: 29 U/L (ref 15–41)
Albumin: 3.1 g/dL — ABNORMAL LOW (ref 3.5–5.0)
Alkaline Phosphatase: 73 U/L (ref 38–126)
Anion gap: 9 (ref 5–15)
BUN: 7 mg/dL (ref 6–20)
CO2: 26 mmol/L (ref 22–32)
Calcium: 8.1 mg/dL — ABNORMAL LOW (ref 8.9–10.3)
Chloride: 103 mmol/L (ref 98–111)
Creatinine, Ser: 1.03 mg/dL (ref 0.61–1.24)
GFR, Estimated: >60 mL/min (ref >60)
Glucose, Bld: 115 mg/dL — ABNORMAL HIGH (ref 70–99)
Potassium: 3 mmol/L — ABNORMAL LOW (ref 3.5–5.1)
Sodium: 138 mmol/L (ref 135–145)
Total Bilirubin: 0.3 mg/dL (ref 0.3–1.2)
Total Protein: 6.4 g/dL — ABNORMAL LOW (ref 6.5–8.1)

## 2022-08-03 LAB — ETHANOL: Alcohol, Ethyl (B): <10 mg/dL (ref <10)

## 2022-08-03 MED ORDER — POTASSIUM CHLORIDE CRYS ER 20 MEQ PO TBCR
40.0000 meq | EXTENDED_RELEASE_TABLET | Freq: Once | ORAL | Status: AC
  Administered 2022-08-03: 40 meq via ORAL
  Filled 2022-08-03: qty 2

## 2022-08-03 NOTE — Discharge Instructions (Addendum)
Please make an appointment with your primary care provider regarding recent ER visit and symptoms.  Today your potassium was low and we brought it back up with a potassium pill.  Your ultrasound today did not find a blood clot.  If symptoms begin to worsen please return to ER.

## 2022-08-03 NOTE — ED Provider Notes (Cosign Needed Addendum)
[facility] Provider Note   CSN: [id number] Arrival date & time: 08/03/22  0501     History  Chief Complaint  Patient presents with   Leg Pain    Carl Carey is a 42 y.o. male history of bipolar 1, encephalopathy, homeless presented with concern for blood clots.  Patient states for the past few weeks he has had blood clots going up and down his left and right arm causing dark black marks that ooze blood.  Patient believes he has blood clots going on around his body causing a rash on his arms and chest.  Patient has not tried any creams.  Patient denies history of blood clots and when asked this question asked " what is a blood clot"?  Patient states he has not used any drugs however smokes weed daily and cigarettes.  Patient denies IVDU.  Patient denied hearing voices without bodies.  Patient states he does not have any visual hallucinations either.  Patient stated he is having right leg swelling that occurs at the end of the day after walking all day but has not noticed changes to his left leg.  Patient states he has full sensation all 4 extremities.  Patient states he is out of his Atarax and other prescription medications.  Patient had chest pain, shortness of breath, hemoptysis, history of prior blood clots, blood thinners, coagulopathy, recent travel/hospitalization/surgery, SI/HI, changes in sensation/motor skills, fever/chills, pruritus  Home Medications Prior to Admission medications   Not on File      Allergies    Patient has no known allergies.    Review of Systems   Review of Systems See HPI Physical Exam Updated Vital Signs BP 105/66 (BP Location: Right Arm)   Pulse 61   Temp 98.7 F (37.1 C) (Oral)   Resp 18   Ht 5\' 8"  (1.727 m)   Wt 67.8 kg   SpO2 100%   BMI 22.72 kg/m  Physical Exam Constitutional:      General: He is not in acute distress. HENT:     Head: Normocephalic and atraumatic.  Eyes:     Extraocular  Movements: Extraocular movements intact.     Conjunctiva/sclera: Conjunctivae normal.     Pupils: Pupils are equal, round, and reactive to light.  Neck:     Comments: 2+ bilateral radial/dorsalis pedis pulses with regular rate Cardiovascular:     Rate and Rhythm: Normal rate and regular rhythm.     Pulses: Normal pulses.     Heart sounds: Normal heart sounds.  Pulmonary:     Effort: Pulmonary effort is normal. No respiratory distress.     Breath sounds: Normal breath sounds.  Abdominal:     Tenderness: There is no abdominal tenderness. There is no guarding or rebound.  Musculoskeletal:        General: Tenderness (Tenderness posterior calf when palpated) present. Normal range of motion.     Cervical back: Normal range of motion.  Skin:    General: Skin is warm and dry.     Capillary Refill: Capillary refill takes less than 2 seconds.     Comments: No overlying skin color changes noted Unable to visualize the black dots on his skin patient verbalizes he can see  Neurological:     General: No focal deficit present.     Mental Status: He is alert and oriented to person, place, and time.  Psychiatric:     Comments: Rapid speech with tangential thought  ED Results / Procedures / Treatments   Labs (all labs ordered are listed, but only abnormal results are displayed) Labs Reviewed  COMPREHENSIVE METABOLIC PANEL - Abnormal; Notable for the following components:      Result Value   Potassium 3.0 (*)    Glucose, Bld 115 (*)    Calcium 8.1 (*)    Total Protein 6.4 (*)    Albumin 3.1 (*)    All other components within normal limits  CBC WITH DIFFERENTIAL/PLATELET - Abnormal; Notable for the following components:   Hemoglobin 12.4 (*)    MCH 25.8 (*)    All other components within normal limits  ETHANOL  RAPID URINE DRUG SCREEN, HOSP PERFORMED    EKG EKG Interpretation  Date/Time:  Tuesday August 03 2022 06:25:31 EDT Ventricular Rate:  74 PR Interval:  161 QRS  Duration: 97 QT Interval:  380 QTC Calculation: 422 R Axis:   71 Text Interpretation: Sinus rhythm Probable left ventricular hypertrophy ST elev, probable normal early repol pattern Confirmed by Carl Carey [phone] on 08/03/2022 6:31:51 AM  Radiology No results found.  Procedures Procedures    Medications Ordered in ED Medications  potassium chloride SA (KLOR-CON M) CR tablet 40 mEq (40 mEq Oral Given 08/03/22 [id number])    ED Course/ Medical Decision Making/ A&P                             Medical Decision Making Risk Prescription drug management.   Carl Carey 42 y.o. presented today for multiple concerns including rash and right calf pain. Working DDx that I considered at this time includes, but not limited to, psychosomatic, dermatitis, DVT, PE, allergic dermatitis, drug side effect, SJS/TEN.  R/o DDx: Dermatitis, DVT, PE, allergic dermatitis, drug side effect, SJS/TEN: These are considered less likely due to history of present illness and physical exam findings  Review of prior external notes: 04/01/2022 ED  Unique Tests and My Interpretation:  CBC with differential: Unremarkable CMP: Hypokalemia 3.0 Ethanol: Unremarkable UDS: Negative EKG: Sinus rhythm 74 bpm, no ST abnormalities or blocks noted Right Lower Leg DVT U/S: Negative  Discussion with Independent Historian: None  Discussion of Management of Tests: None  Risk: Low: based on diagnostic testing/clinical impression and treatment plan  Risk Stratification Score: none  Plan: Patient presented for multiple turns. On exam patient was in no acute distress and had stable vitals.  On exam patient was tender in the posterior calf however did not note any swelling is compared to the left leg.  Ultrasound be ordered to rule out a DVT and the patient is low risk.  Labs and EKG were also ordered.  Patient stable at this time.  Patient's labs came back significant for hypokalemia 3.0 and patient was given potassium  pill which he was able to tolerate orally.  Patient's ultrasounds negative for DVT and the rest the patient's labs were unremarkable.  At this time patient is stable and safe for discharge as his workup has been unremarkable.  I encouraged patient to follow-up with his primary care provider regarding recent symptoms and ER visit and to monitor symptoms.  Today we did not find an organic reason for patient's black dots on his skin which I cannot visualize or any blood clots that the patient was concerned about.  Patient denied any SI/HI/hallucinations/delusions that would necessitate a larger workup.  I suspect patient's symptoms are most likely related to not having his psych meds as opposed  to a life threatening diagnosis at this time.  Patient was given return precautions. Patient stable for discharge at this time.  Patient verbalized understanding of plan.  At time of discharge patient was requesting a refill of his Kenalog cream and Atarax.  Upon chart review patient had Kenalog cream prescribed back in 2021 and Atarax was prescribed for 10 days at the end of 2023.  Both were prescribed for a rash.  Patient still has Atarax pills and was not endorsing any pruritus with me and I do not note any rash that would indicate a reason for either these medications at this time.  I spoke to the patient about this and patient was upset I would not refill either medications.  I encouraged patient to monitor symptoms and that if symptoms worsen to return to the ER and we could revisit the need for Kenalog cream or Atarax pills.   Final Clinical Impression(s) / ED Diagnoses Final diagnoses:  Hypokalemia  Right leg pain    Rx / DC Orders ED Discharge Orders     None          Elvina Sidle 08/03/22 0940    Pattricia Boss, MD 08/05/22 1340

## 2022-08-03 NOTE — Progress Notes (Signed)
Lower extremity venous right study completed.  Preliminary results relayed to [place], [place].   See CV Proc for preliminary results report.   Darlin Coco, RDMS, RVT

## 2022-08-03 NOTE — ED Triage Notes (Addendum)
Pt in with multiple complaints, very talkative throughout triage. States he has a "rash all over, c/o blood clot swelling to R leg, concerned about lack of sideburn growth and needs to get checked for spinal fluid". Pt is asking for "prescriptions through the [place]". Alert, some rambling speech noted. Denies any SI/HI, states he is hearing some voices
